# Patient Record
Sex: Female | Born: 1955
Health system: Southern US, Community
[De-identification: ages and names within clinical notes are randomized; demographics above are authoritative.]

## PROBLEM LIST (undated history)

## (undated) DIAGNOSIS — I214 Non-ST elevation (NSTEMI) myocardial infarction: Secondary | ICD-10-CM

## (undated) DIAGNOSIS — K5900 Constipation, unspecified: Secondary | ICD-10-CM

## (undated) DIAGNOSIS — E876 Hypokalemia: Secondary | ICD-10-CM

## (undated) DIAGNOSIS — I358 Other nonrheumatic aortic valve disorders: Secondary | ICD-10-CM

## (undated) DIAGNOSIS — I2510 Atherosclerotic heart disease of native coronary artery without angina pectoris: Secondary | ICD-10-CM

## (undated) DIAGNOSIS — I5032 Chronic diastolic (congestive) heart failure: Secondary | ICD-10-CM

## (undated) DIAGNOSIS — E785 Hyperlipidemia, unspecified: Secondary | ICD-10-CM

## (undated) DIAGNOSIS — I509 Heart failure, unspecified: Secondary | ICD-10-CM

## (undated) DIAGNOSIS — I519 Heart disease, unspecified: Secondary | ICD-10-CM

## (undated) DIAGNOSIS — K274 Chronic or unspecified peptic ulcer, site unspecified, with hemorrhage: Secondary | ICD-10-CM

## (undated) DIAGNOSIS — D62 Acute posthemorrhagic anemia: Secondary | ICD-10-CM

## (undated) DIAGNOSIS — C539 Malignant neoplasm of cervix uteri, unspecified: Secondary | ICD-10-CM

## (undated) DIAGNOSIS — J449 Chronic obstructive pulmonary disease, unspecified: Secondary | ICD-10-CM

## (undated) DIAGNOSIS — I10 Essential (primary) hypertension: Secondary | ICD-10-CM

## (undated) DIAGNOSIS — K219 Gastro-esophageal reflux disease without esophagitis: Secondary | ICD-10-CM

## (undated) DIAGNOSIS — F17200 Nicotine dependence, unspecified, uncomplicated: Secondary | ICD-10-CM

## (undated) DIAGNOSIS — I129 Hypertensive chronic kidney disease with stage 1 through stage 4 chronic kidney disease, or unspecified chronic kidney disease: Secondary | ICD-10-CM

## (undated) DIAGNOSIS — F32A Depression, unspecified: Secondary | ICD-10-CM

## (undated) DIAGNOSIS — F329 Major depressive disorder, single episode, unspecified: Secondary | ICD-10-CM

## (undated) DIAGNOSIS — E669 Obesity, unspecified: Secondary | ICD-10-CM

## (undated) DIAGNOSIS — R011 Cardiac murmur, unspecified: Secondary | ICD-10-CM

## (undated) DIAGNOSIS — K922 Gastrointestinal hemorrhage, unspecified: Secondary | ICD-10-CM

## (undated) HISTORY — DX: Nicotine dependence, unspecified, uncomplicated: F17.200

## (undated) HISTORY — DX: Hypomagnesemia: E83.42

## (undated) HISTORY — DX: Malignant neoplasm of cervix uteri, unspecified: C53.9

## (undated) HISTORY — DX: Hyperlipidemia, unspecified: E78.5

## (undated) HISTORY — DX: Atherosclerotic heart disease of native coronary artery without angina pectoris: I25.10

## (undated) HISTORY — DX: Hypertensive chronic kidney disease with stage 1 through stage 4 chronic kidney disease, or unspecified chronic kidney disease: I12.9

## (undated) HISTORY — DX: Acute posthemorrhagic anemia: D62

## (undated) HISTORY — DX: Constipation, unspecified: K59.00

## (undated) HISTORY — PX: BREAST BIOPSY: SHX20

## (undated) HISTORY — DX: Gastrointestinal hemorrhage, unspecified: K92.2

## (undated) HISTORY — PX: CHOLECYSTECTOMY: SHX55

## (undated) HISTORY — DX: Chronic diastolic (congestive) heart failure: I50.32

## (undated) HISTORY — DX: Heart disease, unspecified: I51.9

## (undated) HISTORY — DX: Essential (primary) hypertension: I10

## (undated) HISTORY — DX: Heart failure, unspecified: I50.9

## (undated) HISTORY — DX: Chronic obstructive pulmonary disease, unspecified: J44.9

## (undated) HISTORY — DX: Other nonrheumatic aortic valve disorders: I35.8

## (undated) HISTORY — PX: CERVICAL CONE BIOPSY: SUR198

---

## 2000-12-15 ENCOUNTER — Emergency Department (HOSPITAL_COMMUNITY): Admission: EM | Admit: 2000-12-15 | Discharge: 2000-12-15 | Payer: Self-pay | Admitting: Emergency Medicine

## 2001-01-16 ENCOUNTER — Encounter: Payer: Self-pay | Admitting: Internal Medicine

## 2001-01-16 ENCOUNTER — Ambulatory Visit (HOSPITAL_COMMUNITY): Admission: RE | Admit: 2001-01-16 | Discharge: 2001-01-16 | Payer: Self-pay | Admitting: Internal Medicine

## 2001-01-16 ENCOUNTER — Other Ambulatory Visit: Admission: RE | Admit: 2001-01-16 | Discharge: 2001-01-16 | Payer: Self-pay | Admitting: Internal Medicine

## 2002-12-30 ENCOUNTER — Emergency Department (HOSPITAL_COMMUNITY): Admission: EM | Admit: 2002-12-30 | Discharge: 2002-12-30 | Payer: Self-pay | Admitting: Emergency Medicine

## 2002-12-30 ENCOUNTER — Encounter: Payer: Self-pay | Admitting: Emergency Medicine

## 2006-02-08 HISTORY — PX: CORONARY ARTERY BYPASS GRAFT: SHX141

## 2006-03-02 ENCOUNTER — Encounter: Payer: Self-pay | Admitting: Internal Medicine

## 2006-03-02 ENCOUNTER — Ambulatory Visit: Payer: Self-pay | Admitting: Cardiology

## 2006-03-02 ENCOUNTER — Inpatient Hospital Stay (HOSPITAL_COMMUNITY): Admission: EM | Admit: 2006-03-02 | Discharge: 2006-03-11 | Payer: Self-pay | Admitting: Cardiology

## 2006-03-03 ENCOUNTER — Encounter: Payer: Self-pay | Admitting: Vascular Surgery

## 2006-03-24 ENCOUNTER — Ambulatory Visit (HOSPITAL_COMMUNITY): Admission: RE | Admit: 2006-03-24 | Discharge: 2006-03-24 | Payer: Self-pay | Admitting: Cardiology

## 2006-03-24 ENCOUNTER — Ambulatory Visit: Payer: Self-pay | Admitting: Cardiology

## 2006-05-26 ENCOUNTER — Ambulatory Visit: Payer: Self-pay | Admitting: Cardiology

## 2006-06-06 ENCOUNTER — Ambulatory Visit: Payer: Self-pay | Admitting: Thoracic Surgery (Cardiothoracic Vascular Surgery)

## 2006-11-25 ENCOUNTER — Ambulatory Visit: Payer: Self-pay | Admitting: Cardiology

## 2007-06-26 ENCOUNTER — Emergency Department (HOSPITAL_COMMUNITY): Admission: EM | Admit: 2007-06-26 | Discharge: 2007-06-26 | Payer: Self-pay | Admitting: Emergency Medicine

## 2007-06-30 ENCOUNTER — Ambulatory Visit: Payer: Self-pay | Admitting: Cardiology

## 2007-10-27 ENCOUNTER — Encounter (INDEPENDENT_AMBULATORY_CARE_PROVIDER_SITE_OTHER): Payer: Self-pay | Admitting: *Deleted

## 2007-10-27 LAB — CONVERTED CEMR LAB
Albumin: 4.6 g/dL
Cholesterol: 224 mg/dL
Triglycerides: 192 mg/dL

## 2008-05-02 ENCOUNTER — Emergency Department (HOSPITAL_COMMUNITY): Admission: EM | Admit: 2008-05-02 | Discharge: 2008-05-02 | Payer: Self-pay | Admitting: Emergency Medicine

## 2009-04-03 ENCOUNTER — Telehealth (INDEPENDENT_AMBULATORY_CARE_PROVIDER_SITE_OTHER): Payer: Self-pay | Admitting: *Deleted

## 2009-04-04 ENCOUNTER — Encounter (INDEPENDENT_AMBULATORY_CARE_PROVIDER_SITE_OTHER): Payer: Self-pay | Admitting: *Deleted

## 2009-04-04 ENCOUNTER — Ambulatory Visit: Payer: Self-pay | Admitting: Cardiology

## 2009-04-04 DIAGNOSIS — Q251 Coarctation of aorta: Secondary | ICD-10-CM | POA: Insufficient documentation

## 2009-04-04 DIAGNOSIS — R5381 Other malaise: Secondary | ICD-10-CM

## 2009-04-04 DIAGNOSIS — R5383 Other fatigue: Secondary | ICD-10-CM

## 2009-04-04 DIAGNOSIS — F17200 Nicotine dependence, unspecified, uncomplicated: Secondary | ICD-10-CM | POA: Insufficient documentation

## 2009-04-04 DIAGNOSIS — E785 Hyperlipidemia, unspecified: Secondary | ICD-10-CM | POA: Insufficient documentation

## 2009-04-04 DIAGNOSIS — C539 Malignant neoplasm of cervix uteri, unspecified: Secondary | ICD-10-CM | POA: Insufficient documentation

## 2009-04-04 DIAGNOSIS — I2510 Atherosclerotic heart disease of native coronary artery without angina pectoris: Secondary | ICD-10-CM | POA: Insufficient documentation

## 2009-04-21 ENCOUNTER — Encounter: Payer: Self-pay | Admitting: Cardiology

## 2009-04-24 LAB — CONVERTED CEMR LAB
Alkaline Phosphatase: 80 units/L (ref 39–117)
BUN: 11 mg/dL (ref 6–23)
Basophils Absolute: 0 10*3/uL (ref 0.0–0.1)
Chloride: 101 meq/L (ref 96–112)
Cholesterol: 312 mg/dL — ABNORMAL HIGH (ref 0–200)
Eosinophils Absolute: 0.3 10*3/uL (ref 0.0–0.7)
Eosinophils Relative: 3 % (ref 0–5)
HCT: 48.5 % — ABNORMAL HIGH (ref 36.0–46.0)
LDL Cholesterol: 238 mg/dL — ABNORMAL HIGH (ref 0–99)
Lymphocytes Relative: 31 % (ref 12–46)
Lymphs Abs: 2.6 10*3/uL (ref 0.7–4.0)
MCV: 93.6 fL (ref 78.0–100.0)
Neutro Abs: 4.9 10*3/uL (ref 1.7–7.7)
Neutrophils Relative %: 57 % (ref 43–77)
Potassium: 3.8 meq/L (ref 3.5–5.3)
RBC: 5.18 M/uL — ABNORMAL HIGH (ref 3.87–5.11)
TSH: 3.042 microintl units/mL (ref 0.350–4.500)
Total Bilirubin: 0.4 mg/dL (ref 0.3–1.2)
Total Protein: 7.3 g/dL (ref 6.0–8.3)

## 2009-04-26 ENCOUNTER — Telehealth: Payer: Self-pay | Admitting: Cardiology

## 2009-04-27 ENCOUNTER — Encounter (INDEPENDENT_AMBULATORY_CARE_PROVIDER_SITE_OTHER): Payer: Self-pay | Admitting: *Deleted

## 2009-05-08 ENCOUNTER — Ambulatory Visit: Payer: Self-pay | Admitting: Cardiology

## 2009-05-17 ENCOUNTER — Encounter (INDEPENDENT_AMBULATORY_CARE_PROVIDER_SITE_OTHER): Payer: Self-pay | Admitting: *Deleted

## 2009-05-24 ENCOUNTER — Telehealth (INDEPENDENT_AMBULATORY_CARE_PROVIDER_SITE_OTHER): Payer: Self-pay | Admitting: *Deleted

## 2009-05-26 ENCOUNTER — Ambulatory Visit: Payer: Self-pay | Admitting: Cardiology

## 2009-06-15 ENCOUNTER — Ambulatory Visit: Payer: Self-pay | Admitting: Cardiology

## 2009-11-28 ENCOUNTER — Telehealth (INDEPENDENT_AMBULATORY_CARE_PROVIDER_SITE_OTHER): Payer: Self-pay | Admitting: *Deleted

## 2010-01-04 ENCOUNTER — Telehealth (INDEPENDENT_AMBULATORY_CARE_PROVIDER_SITE_OTHER): Payer: Self-pay | Admitting: *Deleted

## 2010-04-12 NOTE — Assessment & Plan Note (Signed)
Summary: 1 MTH NURSE VISIT PER CHECKOUT ON 04/04/09/TG  Nurse Visit   Vital Signs:  Patient profile:   55 year old female Height:      62 inches Weight:      203 pounds O2 Sat:      95 % on Room air Pulse rate:   80 / minute BP sitting:   165 / 81  (left arm)  Vitals Entered By: Teressa Lower RN (May 09, 2009 10:46 AM)  O2 Flow:  Room air  Impression & Recommendations:  Problem # 1:  HYPERTENSION (ICD-401.1)  Increase chlorthalidone to 25 mg q.d. Add lisinopril 20 mg q.d. Blood pressure check in one month; home blood pressure checks Basic metabolic profile in 3 weeks.  Renfrow Bing, M.D.  Future Orders: T-Basic Metabolic Panel [id number]) ... 06/07/2009   Visit Type:  nurse visit Primary Provider:  None   History of Present Illness: Dyspnea; patient failed to bring the list of home blood pressures with her.      Current Medications (verified): 1)  Ecotrin 325 Mg Tbec (Aspirin) .... Take 1 Tab Daily 2)  Metoprolol Tartrate 100 Mg Tabs (Metoprolol Tartrate) .... Take 1/2 Tab Two Times A Day 3)  Simvastatin 80 Mg Tabs (Simvastatin) .... Take One Tablet By Mouth Daily At Bedtime 4)  Chlorthalidone 25 Mg Tabs (Chlorthalidone) .... Take One Half  Tablet By Mouth Daily 5)  Niacin 100 Mg Tabs (Niacin) .... Take 1 Tablet By Mouth Three Times A Day Titrating To 500mg  Three Times A Day  Allergies (verified): No Known Drug Allergies  Orders Added: 1)  T-Basic Metabolic Panel [id number] Prescriptions: LISINOPRIL 20 MG TABS (LISINOPRIL) Take one tablet by mouth daily  #30 x 6   Entered by:   Teressa Lower RN   Authorized by:   Kathlen Brunswick, MD, [facility]   Signed by:   Teressa Lower RN on 05/17/2009   Method used:   Electronically to        [facility]  [address] (retail)       [address]       [place], [place]  [address]       Ph: [phone]       Fax: [phone]   RxID:   [id number] CHLORTHALIDONE 25 MG TABS (CHLORTHALIDONE)  Take one  tablet by mouth daily  #30 x 6   Entered by:   Teressa Lower RN   Authorized by:   Kathlen Brunswick, MD, [facility]   Signed by:   Teressa Lower RN on 05/17/2009   Method used:   Electronically to        [facility]  [address] (retail)       [address]       [place], [place]  [address]       Ph: [phone]       Fax: [phone]   RxID:   [id number]   pt notified of changes  by telephone and by mail, rx, labs ordered and nurse visit scheduled for 06/15/2009 4pm

## 2010-04-12 NOTE — Progress Notes (Signed)
Summary: Refill   Phone Note Call from Patient   Caller: Patient Reason for Call: Refill Medication Summary of Call: patient needs refill on Simvastatin 80mg  sent to [facility] in [place] / would like 90 day supply / tg Initial call taken by: Raechel Ache [facility],  January 04, 2010 1:50 PM    Prescriptions: SIMVASTATIN 80 MG TABS (SIMVASTATIN) Take one tablet by mouth daily at bedtime  #90 x 3   Entered by:   Teressa Lower RN   Authorized by:   Kathlen Brunswick, MD, [facility]   Signed by:   Teressa Lower RN on 01/04/2010   Method used:   Electronically to        [facility]  [address] (retail)       [address]       [place], [place]  [address]       Ph: [phone]       Fax: [phone]   RxID:   [id number]

## 2010-04-12 NOTE — Progress Notes (Signed)
Summary: REfill   Phone Note Call from Patient   Caller: Patient Reason for Call: Refill Medication Summary of Call: patient needs refill for Metoprolol TART 100mg  1/2 tab two times a day called into [facility] in [place]/tg Initial call taken by: Raechel Ache [facility],  November 28, 2009 1:38 PM    Prescriptions: METOPROLOL TARTRATE 100 MG TABS (METOPROLOL TARTRATE) take 1/2 tab two times a day  #30 x 6   Entered by:   Teressa Lower RN   Authorized by:   Kathlen Brunswick, MD, [facility]   Signed by:   Teressa Lower RN on 11/28/2009   Method used:   Electronically to        [facility]  [address] (retail)       [address]       [place], [place]  [address]       Ph: [phone]       Fax: [phone]   RxID:   [id number]

## 2010-04-12 NOTE — Assessment & Plan Note (Signed)
Summary: nurse visit  Nurse Visit   Vital Signs:  Patient profile:   55 year old female Height:      62 inches Weight:      198 pounds O2 Sat:      98 % on Room air Pulse rate:   74 / minute BP sitting:   107 / 59  (left arm)  Vitals Entered By: Teressa Lower RN (June 15, 2009 3:52 PM)  O2 Flow:  Room air   Visit Type:  3 week Nurse visit Primary Provider:  None   History of Present Illness: dizziness when standing, sob normal for pt obtained orthostatic bp's   Current Medications (verified): 1)  Ecotrin 325 Mg Tbec (Aspirin) .... Take 1 Tab Daily 2)  Metoprolol Tartrate 100 Mg Tabs (Metoprolol Tartrate) .... Take 1/2 Tab Two Times A Day 3)  Simvastatin 80 Mg Tabs (Simvastatin) .... Take One Tablet By Mouth Daily At Bedtime 4)  Chlorthalidone 25 Mg Tabs (Chlorthalidone) .... Take One  Tablet By Mouth Daily 5)  Niacin 500 Mg Tabs (Niacin) .... Take 1 Tablet By Mouth Three Times A Day 6)  Lisinopril 20 Mg Tabs (Lisinopril) .... Take One Tablet By Mouth Daily  Allergies (verified): No Known Drug Allergies

## 2010-04-12 NOTE — Progress Notes (Signed)
Summary: reaction to meds   Phone Note Call from Patient Call back at Home Phone [phone]   Caller: pt Reason for Call: Talk to Nurse Summary of Call: Pt c/o back ache and nausea-wonders if this is an adverse effect of medication.  Her symptoms are unlikely to have been caused by the modifications in her medical regime made 2 weeks ago.  Have her come in for a nursing visit, obtain complete metabolic profile and measure orthostatic vital signs.  Warrens Bing, M.D.   Initial call taken by: Faythe Ghee,  May 24, 2009 3:17 PM  Follow-up for Phone Call        pt started taking medication 2 days later stomach and back began to hurt and nausea developed.  Pt feels drained and listless.  Appetite is the same.  Last bp  05/14/2009   151/71   86 Follow-up by: Teressa Lower RN,  May 24, 2009 4:48 PM  Additional Follow-up for Phone Call Additional follow up Details #1::        pt's phone not accepting calls at this time will call back at another time Additional Follow-up by: Teressa Lower RN,  May 25, 2009 2:32 PM    Additional Follow-up for Phone Call Additional follow up Details #2::    nurse visit 05/26/2009 1600 Follow-up by: Teressa Lower RN,  May 25, 2009 5:03 PM

## 2010-04-12 NOTE — Miscellaneous (Signed)
Summary: LABS LIPID,LIVER,10/27/2007  Clinical Lists Changes  Observations: Added new observation of ALBUMIN: 4.6 g/dL (16/12/9602 5:40) Added new observation of PROTEIN, TOT: 7.3 g/dL (98/01/9146 8:29) Added new observation of SGPT (ALT): 33 units/L (10/27/2007 8:31) Added new observation of SGOT (AST): 24 units/L (10/27/2007 8:31) Added new observation of ALK PHOS: 75 units/L (10/27/2007 8:31) Added new observation of BILI DIRECT: 0.1 mg/dL (56/21/3086 5:78) Added new observation of LDL: 138 mg/dL (46/96/2952 8:41) Added new observation of HDL: 48 mg/dL (32/44/0102 7:25) Added new observation of TRIGLYC TOT: 192 mg/dL (36/64/4034 7:42) Added new observation of CHOLESTEROL: 224 mg/dL (59/56/3875 6:43)

## 2010-04-12 NOTE — Letter (Signed)
Summary: [place] Future Lab Work Letter  [facility]     [place], [place]    Phone:   Fax:      April 04, 2009 MRN: [id number]   [facility] [address] [place], [place]  [address]      YOUR LAB WORK IS DUE   _____________MONDAY____________________________  Please go to [facility], located across the street from [facility] on the second floor.  Hours are Monday - Friday 7am until 7:30pm         Saturday 8am until 12noon    _X_  DO NOT EAT OR DRINK AFTER MIDNIGHT EVENING PRIOR TO LABWORK  __ YOUR LABWORK IS NOT FASTING --YOU MAY EAT PRIOR TO LABWORK

## 2010-04-12 NOTE — Letter (Signed)
Summary: [place] Future Lab Work Engineer, agricultural at [employer]  [address] [place] [address]   Phone: [phone]  Fax: [phone]     May 17, 2009 MRN: [id number]   [facility] [address] [place], [place]  [address]      YOUR LAB WORK IS DUE  June 07, 2009 _________________________________________  Please go to [facility], located across the street from [facility] on the second floor.  Hours are Monday - Friday 7am until 7:30pm         Saturday 8am until 12noon    __  DO NOT EAT OR DRINK AFTER MIDNIGHT EVENING PRIOR TO LABWORK  _X_ YOUR LABWORK IS NOT FASTING --YOU MAY EAT PRIOR TO LABWORK

## 2010-04-12 NOTE — Letter (Signed)
Summary: [place] Results Engineer, agricultural at [facility]  [address], [place] [address]   Phone: [phone]  Fax: [phone]      May 17, 2009 MRN: [id number]   [facility] [address] [place], [place]  [address]   Dear Ms. Brodman,  Your test ordered by Selena Batten has been reviewed by your physician (or physician assistant) and was found to be normal or stable. Your physician (or physician assistant) felt no changes were needed at this time.  ____ Echocardiogram  ____ Cardiac Stress Test  ____ Lab Work  ____ Peripheral vascular study of arms, legs or neck  ____ CT scan or X-ray  ____ Lung or Breathing test  __x__ Other: Nurse Visit  Per Dr. Dietrich Pates: Increase chlorthalidone to 25mg  daily (1 tablet) start lisinopril 20mg  daily Nurse Visit 06/15/2009 at 4:00 pm Continue checking blood pressure daily and bring these reading to your nurse visit Labwork in 3 week, requistion enclosed   Thank you, Kalief Kattner RN     Bing, MD, [facility].C.Gaylord Shih, MD, F.A.C.C Lewayne Bunting, MD, F.A.C.C Nona Dell, MD, F.A.C.C Charlton Haws, MD, [facility].C.C

## 2010-04-12 NOTE — Letter (Signed)
Summary: [place] Future Lab Work Engineer, agricultural at [employer]  [address] [place] [address]   Phone: [phone]  Fax: [phone]     April 27, 2009 MRN: [id number]   [facility] [address] [place], [place]  [address]      YOUR LAB WORK IS DUE   ____________APRIL 18, 2011_____________________________  Please go to [facility], located across the street from [facility] on the second floor.  Hours are Monday - Friday 7am until 7:30pm         Saturday 8am until 12noon    _X_  DO NOT EAT OR DRINK AFTER MIDNIGHT EVENING PRIOR TO LABWORK  __ YOUR LABWORK IS NOT FASTING --YOU MAY EAT PRIOR TO LABWORK

## 2010-04-12 NOTE — Progress Notes (Signed)
Summary: rx refills made appt already   Phone Note Call from Patient Call back at [phone]   Caller: pt walk in Reason for Call: Refill Medication Summary of Call: pt needs metoprolol 100 mg 1/2 tab by mouth twice day, pravastatin 40 mg [facility] Initial call taken by: Faythe Ghee,  April 03, 2009 4:39 PM  Follow-up for Phone Call        pt to see md tomorrow, has not been seen since 09 will wait to see what md prescribes prior to refills Follow-up by: Teressa Lower RN,  April 03, 2009 4:56 PM     Appended Document: rx refills made appt already per pt call has not been called in to [facility] yet

## 2010-04-12 NOTE — Assessment & Plan Note (Signed)
Summary: nurse visit  Nurse Visit   Vital Signs:  Patient profile:   55 year old female Height:      62 inches Weight:      199 pounds O2 Sat:      97 % on Room air Pulse rate:   73 / minute Pulse (ortho):   75 / minute BP sitting:   102 / 54  (right arm) BP standing:   94 / 55  Vitals Entered By: Dreama Saa, CNA (May 26, 2009 3:48 PM)  O2 Flow:  Room air  Impression & Recommendations:  Problem # 1:  HYPERTENSION (ICD-401.1) Continue to collect home blood pressures Return RN visit in one month  [place]on Bing, M.D. She   Visit Type:  nurse visit Primary Provider:  None   History of Present Illness: c/o shortness of breath,brought bp diary only 4 recordings as listed 04-21-09     184/74   90. 04-26-09     172/81  82 05-04-09     146/74  86 3-6/11     151/71   86   Serial Vital Signs/Assessments:  Time      Position  BP       Pulse  Resp  Temp     By 4:15 PM   Lying LA  96/59    71                    Tammy Sanders RN 4:15 PM   Sitting   101/61   70                    Tammy Sanders RN 4:15 PM   Standing  94/55    75                    Tammy Sanders RN  Comments: 4:15 PM lying symptoms:   none sitting symptoms:  none standing symptoms:  gait unsteady By: Teressa Lower RN    Current Medications (verified): 1)  Ecotrin 325 Mg Tbec (Aspirin) .... Take 1 Tab Daily 2)  Metoprolol Tartrate 100 Mg Tabs (Metoprolol Tartrate) .... Take 1/2 Tab Two Times A Day 3)  Simvastatin 80 Mg Tabs (Simvastatin) .... Take One Tablet By Mouth Daily At Bedtime 4)  Chlorthalidone 25 Mg Tabs (Chlorthalidone) .... Take One  Tablet By Mouth Daily 5)  Niacin 100 Mg Tabs (Niacin) .... Take 1 Tablet By Mouth Three Times A Day Titrating To 500mg  Three Times A Day 6)  Lisinopril 20 Mg Tabs (Lisinopril) .... Take One Tablet By Mouth Daily  Allergies (verified): No Known Drug Allergies

## 2010-04-12 NOTE — Assessment & Plan Note (Signed)
Summary: f1y  Medications Added PRAVASTATIN SODIUM 40 MG TABS (PRAVASTATIN SODIUM) take 1 tab daily CHLORTHALIDONE 25 MG TABS (CHLORTHALIDONE) Take one half  tablet by mouth daily      Allergies Added: NKDA  Visit Type:  Follow-up Primary Provider:  None   History of Present Illness: Return visit for this pleasant 55 year old woman with coronary artery disease and multiple cardiovascular risk factors.  Since her last visit, she has done fairly well.  She does not have a primary care physician and has not seen any other healthcare providers.  She has not required hospitalization or emergency department evaluation.  She has no chest discomfort, but  chronic class 2-3 dyspnea on exertion is present.  Current Medications (verified): 1)  Ecotrin 325 Mg Tbec (Aspirin) .... Take 1 Tab Daily 2)  Metoprolol Tartrate 100 Mg Tabs (Metoprolol Tartrate) .... Take 1/2 Tab Two Times A Day 3)  Pravastatin Sodium 40 Mg Tabs (Pravastatin Sodium) .... Take 1 Tab Daily 4)  Chlorthalidone 25 Mg Tabs (Chlorthalidone) .... Take One Half  Tablet By Mouth Daily  Allergies (verified): No Known Drug Allergies  Past History:  PMH, FH, and Social History reviewed and updated.  Review of Systems       The patient complains of dyspnea on exertion.  The patient denies weight loss, weight gain, vision loss, decreased hearing, hoarseness, chest pain, syncope, peripheral edema, prolonged cough, headaches, and abdominal pain.    Vital Signs:  Patient profile:   55 year old female Height:      62 inches Weight:      206 pounds BMI:     37.81 Pulse rate:   75 / minute BP sitting:   162 / 74  (right arm)  Vitals Entered By: Dreama Saa, CNA (April 04, 2009 2:39 PM)  Physical Exam  General:   General-Obese but well developed; no acute distress:   Neck-No JVD; bilateral carotid bruits vs. transmitted murmur Lungs-No tachypnea, no rales; minor inspiratory and expiratory wheezes and  rhonchi Cardiovascular-normal PMI; normal S1 and S2; grade 2-3/6 systolic ejection murmur at the cardiac base Abdomen-BS normal; soft and non-tender without masses or organomegaly:  Musculoskeletal-No deformities, no cyanosis or clubbing: Neurologic-Normal cranial nerves; symmetric strength and tone:  Skin-Warm, no significant lesions: Extremities-Nl distal pulses; no edema:     Impression & Recommendations:  Problem # 1:  ATHEROSCLEROTIC CARDIOVASCULAR DISEASE (ICD-429.2) She remains stable, now 3 years following coronary artery bypass graft surgery.  I explained her that our principle goal is to optimize cardiovascular risk factors as detailed below.  Problem # 2:  TOBACCO ABUSE (ICD-305.1) After a brief period of abstinence, she has resumed cigarette smoking.  She consumes up to one pack per day.  I emphasized how deleterious this is for her, but she is not inclined to quit at the present time.  Problem # 3:  HYPERLIPIDEMIA (ICD-272.4) She has very high total cholesterol and LDL at baseline, and is unlikely to achieve controlled with pravastatin.  She could not afford other agents.  A fasting lipid profile will be reassessed.  We can consider the addition of regular niacin.  Problem # 4:  COARCTATION OF AORTA (ICD-747.10) This lesion was discovered when she underwent coronary artery bypass graft surgery.  I will obtain prior records and review them, but I believe that her coarctation was thought mild enough that she would never need intervention.  Problem # 5:  HYPERTENSION (ICD-401.1) Blood pressure control is suboptimal.  We will ask her to monitor  blood pressures at a local pharmacy.  Chlorthalidone 12.5 mg q.d. will be added to her medical regime.  She will return in one month for a blood pressure check with cardiology nurses.  I will see her again in 4 months.  Other Orders: Future Orders: T-Comprehensive Metabolic Panel ([id number]) ... 04/10/2009 T-Lipid Profile  [id number]) ... 04/10/2009 T-CBC w/Diff ([id number]) ... 04/10/2009 T-TSH [phone] [phone]) ... 04/10/2009  Patient Instructions: 1)  Your physician recommends that you schedule a follow-up appointment in: 1 year 2)  Your physician recommends that you return for lab work in: next week 3)  Your physician has recommended you make the following change in your medication: chlorthalidone 1/2 tablet by mouth daily 4)  You have been referred to nurse visit in 1 month 5)  Your physician has requested that you regularly monitor and record your blood pressure readings at home.  Please use the same machine at the same time of day to check your readings and record them to bring to your follow-up visit. [facility] or other [facility] stores Prescriptions: CHLORTHALIDONE 25 MG TABS (CHLORTHALIDONE) Take one half  tablet by mouth daily  #15 x 6   Entered by:   Teressa Lower RN   Authorized by:   Kathlen Brunswick, MD, [facility]   Signed by:   Teressa Lower RN on 04/04/2009   Method used:   Electronically to        [facility]  [address] (retail)       [address]       [place], [place]  [address]       Ph: [phone]       Fax: [phone]   RxID:   [id number]

## 2010-04-12 NOTE — Progress Notes (Signed)
Summary: Lab work results/change in meds  Medications Added SIMVASTATIN 80 MG TABS (SIMVASTATIN) Take one tablet by mouth daily at bedtime NIACIN 100 MG TABS (NIACIN) Take 1 tablet by mouth three times a day titrating to 500mg  three times a day       Phone Note Outgoing Call   Call placed by: Larita Fife Via LPN,  April 26, 2009 10:28 AM Reason for Call: Discuss lab or test results Summary of Call: No answer, left message. Due to recent lab work patient needs to be advised to stop taking Pravastatin and start taking Simvastatin 80mg  by mouth once daily and Niacin starting at a dose of 100mg  by mouth three times a day titrateing up to 500mg  by mouth three times a day. Mrs. Mitchell also needs to repeat lab work in 2 months.    New/Updated Medications: SIMVASTATIN 80 MG TABS (SIMVASTATIN) Take one tablet by mouth daily at bedtime NIACIN 100 MG TABS (NIACIN) Take 1 tablet by mouth three times a day titrating to 500mg  three times a day Prescriptions: SIMVASTATIN 80 MG TABS (SIMVASTATIN) Take one tablet by mouth daily at bedtime  #30 x 6   Entered by:   Teressa Lower RN   Authorized by:   Gaylord Shih, MD, [facility]   Signed by:   Teressa Lower RN on 04/27/2009   Method used:   Electronically to        [facility]  [address] (retail)       [address]       [place], [place]  [address]       Ph: [phone]       Fax: [phone]   RxID:   [id number]   Appended Document: Lab work results/change in meds    Clinical Lists Changes  Orders: Added new Test order of T-Lipid Profile [phone]) - Signed Added new Test order of T-Comprehensive Metabolic Panel [phone]) - Signed

## 2010-06-02 ENCOUNTER — Other Ambulatory Visit: Payer: Self-pay | Admitting: Cardiology

## 2010-06-26 LAB — STREP A DNA PROBE: Group A Strep Probe: NEGATIVE

## 2010-07-11 ENCOUNTER — Telehealth: Payer: Self-pay | Admitting: Cardiology

## 2010-07-11 MED ORDER — METOPROLOL TARTRATE 100 MG PO TABS: 50.0000 mg | ORAL_TABLET | Freq: Two times a day (BID) | ORAL | Status: DC

## 2010-07-11 NOTE — Telephone Encounter (Signed)
METOPROLOL 100MG  NEEDS CALLED IN TO [facility]. PT WAS DUE TO SEE [facility] 03/2010, SHE DOESN'T WANT TO MAKE APPT. DUE TO MONEY.

## 2010-07-11 NOTE — Telephone Encounter (Signed)
Pt has money issues, needs an appt , requests to be billed because of no income, will send [facility] discount papers to pt

## 2010-07-12 ENCOUNTER — Encounter: Payer: Self-pay | Admitting: *Deleted

## 2010-07-12 NOTE — Telephone Encounter (Signed)
I sent pt an application for the [facility] discount and offered to billed her for her visit/

## 2010-07-24 NOTE — Assessment & Plan Note (Signed)
[facility]                       [place] CARDIOLOGY OFFICE NOTE   Christina Boyle, Christina Boyle                    MRN:          [id number]  DATE:06/30/2007                            DOB:          11/15/1955    Christina Boyle returns to the office as scheduled for continued assessment  and treatment of coronary disease and cardiovascular risk factors.  Unfortunately, she is not eligible for care at the free clinic.  She has  not been able to enroll in cardiac rehabilitation due to financial  considerations.  She was apparently given forms to fill out for a  scholarship, but failed to do so.  She continues to smoke cigarettes.   CURRENT MEDICATIONS:  Include aspirin 325 mg daily, metoprolol 50 mg  b.i.d., simvastatin 80 mg daily.   EXAM:  Overweight woman with flat affect in no acute distress.  The weight is 198, 4 pounds increase since September.  Blood pressure  130/80, heart rate 75 and regular, respirations 16.  NECK:  No jugular venous distention; bilateral bruits versus transmitted  murmur.  LUNGS:  Inspiratory and expiratory rhonchi.  CARDIAC:  Normal first and second heart sounds; grade 2 - 3/6 systolic  ejection murmur at the cardiac base.  ABDOMEN:  Soft and nontender; no organomegaly.  EXTREMITIES:  Normal distal pulses; no edema.   Last lipid profile was in September, at which time total cholesterol was  323, triglycerides 194, HDL 44 and LDL 240.  Chemistry profile was  normal at that time.  She was switched to simvastatin 80 mg daily after  multiple attempts to contact her by phone.  She did not return for a  followup lipid profile.   IMPRESSION:  Christina Boyle continues to have suboptimal control of risk  factors due to financial issues and indifference to her medical care.  We will check a lipid profile and chemistry profile today and adjuster  lipid lowering medication, if this will be financially feasible.  Hypertension is controlled.   Her cerebrovascular disease was assessed in  late 2007 at which time there was no significant focal stenosis.  She  has a coarctation that does not require intervention.  We will try to  continue to pursue cardiac rehabilitation for her and plan a return  office visit in 9 months.     Gerrit Friends. Dietrich Pates, MD, [facility]  Electronically Signed    RMR/MedQ  DD: 06/30/2007  DT: 06/30/2007  Job #: [id number]

## 2010-07-24 NOTE — Assessment & Plan Note (Signed)
[facility]ALTHC[facility]                       [place] CARDIOLOGY OFFICE NOTE   Christina Boyle, Christina Boyle                    MRN:          [id number]  DATE:11/25/2006                            DOB:          26-Nov-1955    REFERRING PHYSICIAN:  None.   Christina Boyle returns to the office for continued assessment and treatment  of coronary disease, cerebrovascular disease and cardiovascular risk  factors.  She has completely recovered from CABG surgery.  She continues  to have a class 2 dyspnea on exertion, which has been chronic.  She was  told that the cardiac rehabilitation program would be in touch with her,  but they have not.  Unfortunately, she continues to do some cigarette  smoking although she maintains that consumption has been reduced.   CURRENT MEDICATIONS:  1. Aspirin 25 mg daily.  2. Metoprolol 50 mg b.i.d.  3. Pravastatin 40 mg daily.  She never obtained followup labs because      she was asked to pay for the studies in advance.   EXAMINATION:  GENERAL APPEARANCE:  Overweight woman with flat affect in  no acute distress. The weight is 193, 30 pounds increase since  postoperatively.  VITAL SIGNS:  Blood pressure 150/75, heart rate 75 and regular,  respirations 16.  NECK:  Left carotid bruit.  LUNGS:  Clear.  CARDIAC:  Normal first heart sounds; preserved aortic component on the  second heart sound; grade 2/6 basalar systolic ejection murmur.  ABDOMEN:  Soft and nontender; no organomegaly.  EXTREMITIES:  One plus distal pulses; no edema.   IMPRESSION:  Christina Boyle has challenges both behavioral and financial.  We will remind cardiac rehabilitation to enroll her. This should help  with weight and compliance.  We will try to arrange for care in the free  clinic or elsewhere in [place].  She could consider an application  for disability, but this would be a long shot.  We will obtain a lipid  profile and chemistry profile today.     Gerrit Friends.  Dietrich Pates, MD, [facility]  Electronically Signed    RMR/MedQ  DD: 11/25/2006  DT: 11/25/2006  Job #: [id number]

## 2010-07-25 ENCOUNTER — Ambulatory Visit (INDEPENDENT_AMBULATORY_CARE_PROVIDER_SITE_OTHER): Payer: Self-pay | Admitting: Adult Health

## 2010-07-25 ENCOUNTER — Encounter: Payer: Self-pay | Admitting: Adult Health

## 2010-07-25 ENCOUNTER — Encounter: Payer: Self-pay | Admitting: *Deleted

## 2010-07-25 DIAGNOSIS — I359 Nonrheumatic aortic valve disorder, unspecified: Secondary | ICD-10-CM

## 2010-07-25 DIAGNOSIS — E785 Hyperlipidemia, unspecified: Secondary | ICD-10-CM

## 2010-07-25 DIAGNOSIS — I358 Other nonrheumatic aortic valve disorders: Secondary | ICD-10-CM

## 2010-07-25 DIAGNOSIS — I2510 Atherosclerotic heart disease of native coronary artery without angina pectoris: Secondary | ICD-10-CM

## 2010-07-25 DIAGNOSIS — I10 Essential (primary) hypertension: Secondary | ICD-10-CM

## 2010-07-25 MED ORDER — METOPROLOL TARTRATE 100 MG PO TABS: 50.0000 mg | ORAL_TABLET | Freq: Two times a day (BID) | ORAL | Status: DC

## 2010-07-25 NOTE — Assessment & Plan Note (Addendum)
I hear an AoV murmur.  She will have echocardiogram for LV fx and evaluation of this. She is asymptomatic with chest pain or fatigue. Unfortunately she continues to smoke.  There are some wheezes noted, but cleared easily with coughing.  I have advised her on cessation and ongoing CVD.  Continue on BB and ASA. BP up a little today. She has not taken her medications yet today.  I have ordered a BMET for renal fx on chlorothalidone.

## 2010-07-25 NOTE — Progress Notes (Signed)
HPI:Mrs Vanamburg is a very pleasant CF we are seeing for known history of CAD with CABG in 2007.  We are seeing her on one year follow-up. Continues chronic SOB, with exertion.  Still smoking.  No chest pain or hospitalizations.  Complaint of swelling in her hands causing break open and bleed at the joints.  No rash is seen.  Is seeing a physician at the health dept for this.   Allergies not on file  Current Outpatient Prescriptions  Medication Sig Dispense Refill  . aspirin 325 MG tablet Take 325 mg by mouth daily.        . chlorthalidone (HYGROTON) 25 MG tablet Take 25 mg by mouth daily.        . metoprolol (LOPRESSOR) 100 MG tablet Take 0.5 tablets (50 mg total) by mouth 2 (two) times daily.  30 tablet  0  . simvastatin (ZOCOR) 80 MG tablet Take 80 mg by mouth at bedtime.        Marland Kitchen DISCONTD: lisinopril (PRINIVIL,ZESTRIL) 20 MG tablet Take 20 mg by mouth daily.        Marland Kitchen DISCONTD: niacin (NIASPAN) 500 MG CR tablet Take 500 mg by mouth at bedtime.          Past Medical History  Diagnosis Date  . ASCVD (arteriosclerotic cardiovascular disease) 02/2006    coronary artery bypass graft surgery  . Tobacco abuse     discontinued in 03/2006 ;50 pack year consumption resumed  . Chronic bronchitis   . Coarctation of aorta   . Cervical cancer   . Hyperlipidemia     Past Surgical History  Procedure Date  . Left breast biopsy     for benign disease  . Cholecystectomy   . Coronary artery bypass graft 02/2006  . Cervical carcinoma     WNU:UVOZDG of systems complete and found to be negative unless listed above PHYSICAL EXAM BP 189/78  Pulse 74  Ht 5\' 2"  (1.575 m)  Wt 197 lb (89.359 kg)  BMI 36.03 kg/m2  SpO2 95% General: Well developed, well nourished, in no acute distress Head: Eyes PERRLA, No xanthomas.   Normal cephalic and atramatic  Lungs: Clear bilaterally to auscultation and percussion. Heart: HRRR S1 S2. 2/6 Systolic murmur with preserved S2.  Pulses are 2+ & equal.  Bilateral radiation carotid bruit. No JVD.  No abdominal bruits. No femoral bruits. Abdomen: Bowel sounds are positive, abdomen soft and non-tender without masses or                  Hernia's noted. Msk:  Back normal, normal gait. Normal strength and tone for age. Extremities: No clubbing, cyanosis or edema.  DP +1 Right first finger with cracking in the second joint,       No oozing or bleeding. Neuro: Alert and oriented X 3. Psych:  Good affect, responds appropriately EKG: NSR, LAE, nonspecific T wave abnormalities.  ASSESSMENT AND PLAN:

## 2010-07-25 NOTE — Progress Notes (Signed)
Addended by: Teressa Lower on: 07/25/2010 12:01 PM   Modules accepted: Orders

## 2010-07-25 NOTE — Assessment & Plan Note (Signed)
Will check lipids and LFT's.  She will have refill on Pravastatin.  No changes in dosages.

## 2010-07-25 NOTE — Patient Instructions (Signed)
Your physician recommends that you schedule a follow-up appointment in: 1 year Your physician recommends that you return for lab work [id number] 07/30/10 Your physician has requested that you have an echocardiogram. Echocardiography is a painless test that uses sound waves to create images of your heart. It provides your doctor with information about the size and shape of your heart and how well your heart's chambers and valves are working. This procedure takes approximately one hour. There are no restrictions for this procedure.  Marland Kitchen

## 2010-07-27 ENCOUNTER — Encounter: Payer: Self-pay | Admitting: Adult Health

## 2010-07-27 NOTE — Assessment & Plan Note (Signed)
[facility]                       [place] CARDIOLOGY OFFICE NOTE   Christina, Boyle                    MRN:          [id number]  DATE:05/26/2006                            DOB:          [birth year]    [name] returns to the office, now 3 months following CABG surgery.  She has residual aortic stenosis and coarctation of the aorta, both  thought to be nonsurgical at the present time. She never filled her  prescription for simvastatin. She has been taking;  1. Metoprolol 25 mg b.i.d.  2. Aspirin.   She has some lancinating chest discomfort at night that awakens her.  These last a matter of seconds. She has no dyspnea. She was never called  by cardiac rehabilitation.   On exam, pleasant woman in no acute distress. The weight is 181,  eighteen pounds more than at her last visit. Blood pressure 125/70,  heart rate 75 and regular, respirations 16.  NECK: No jugular venous distension.  LUNGS: Clear.  THORAX: Well-healed incision; mild tenderness over the sternum, which is  stable.  CARDIAC: Normal first and second heart sounds; grade 1-2/6 basalar  systolic ejection murmur.  ABDOMEN: Soft, and nontender; no organomegaly.  EXTREMITIES: No edema; no 1 + distal pulses.   IMPRESSION:  [name] is doing well. Blood pressure is excellent, but  which a known coarctation, we would like her fully beta blocked. Her  dose of metoprolol will be increased to 50 mg b.i.d. We will change her  lipid lowering agent to pravastatin 40 mg q.d. so it can be obtained at  [facility] cost effectively. A lipid profile and chemistry profile will be  obtained in 1 month. She has refrained from cigarette smoking since her  surgery and was congratulated. I will see her again in 6 months.     Gerrit Friends. Dietrich Pates, MD, [facility]  Electronically Signed    RMR/MedQ  DD: 05/26/2006  DT: 05/26/2006  Job #: [id number]

## 2010-07-27 NOTE — Op Note (Signed)
NAMESHRESHTA, MEDLEY             ACCOUNT NO.:  [id number]   MEDICAL RECORD NO.:  [id number]          PATIENT TYPE:  INP   LOCATION:  2305                         FACILITY:  [facility]   PHYSICIAN:  Salvatore Decent. Cornelius Moras, M.D. DATE OF BIRTH:  June 07, 1955   DATE OF PROCEDURE:  03/05/2006  DATE OF DISCHARGE:                               OPERATIVE REPORT   PREOPERATIVE DIAGNOSIS:  Left main disease with three-vessel coronary  artery disease.   POSTOPERATIVE DIAGNOSIS:  Left main disease with three-vessel coronary  artery disease.   PROCEDURE:  Median sternotomy for coronary artery bypass grafting x3  (left internal mammary artery to distal left anterior descending  coronary artery, saphenous vein graft to circumflex marginal branch,  saphenous vein graft to posterior descending coronary artery, endoscopic  saphenous vein harvest from right thigh and right lower leg).   SURGEON:  Dr. Purcell Nails   ASSISTANT:  Coral Ceo, P.A.   ANESTHESIA:  General   CLINICAL NOTE:  The patient is a 55 year old mildly obese female with no  documented previous history of coronary artery disease but risk factors  notable for longstanding tobacco abuse, familial hyperlipidemia as well  as a family history of premature coronary artery disease.  The patient  presents with the 2-3 year history of symptoms consistent with worsening  exertional angina.  She subsequently presented with an allergic reaction  related to topical exposure to cedar trees in her yard.  She developed a  severe rash as well as periorbital swelling.  In the emergency room at  [facility] she was given intravenous epinephrine.  Following  this, she developed severe substernal chest pain consistent with  unstable angina.  She ruled in for an acute myocardial infarction.  She  was transferred to [facility] where she underwent  cardiac catheterization demonstrating left main disease with severe  three-vessel  coronary artery disease and normal left ventricular  function.  She was also found to have a mild coarctation of the aorta  just beyond the left subclavian artery.  A full consultation has been  dictated previously.  The patient has been counseled at length regarding  the indications, risks, and potential benefits of surgery.  Alternative  treatment strategies have been discussed.  She understands and accepts  all associated risks of surgery and desires to proceed as described.   OPERATIVE FINDINGS:  1. Severe atherosclerotic disease with diffuse plaque involving all      epicardial coronary arteries as well as the ascending thoracic      aorta.  The patient should probably not be considered a candidate      for repeat surgical revascularization in the future.  2. Moderate left ventricular hypertrophy with normal left ventricular      systolic function.  3. Intramyocardial left anterior descending coronary artery.  4. Diagonal branch severely diseased and too small for grafting  5. Good-quality left internal mammary artery and saphenous vein      conduit.   OPERATIVE NOTE:  The patient is brought to the operating room on the  above-mentioned date and central monitoring was established  by the  anesthesia service under the care and direction of Dr. Jairo Ben.  Specifically, a Swan-Ganz catheter was placed through the right internal  jugular approach.  A radial arterial line is placed.  Intravenous  antibiotics were administered.  Following induction with general  endotracheal anesthesia, a Foley catheter is placed.  The patient's  chest, abdomen, both groins, and both lower extremities were prepared  and draped in a sterile manner.  Baseline transesophageal echocardiogram  is performed by Dr. Jean Rosenthal.  This demonstrates normal left ventricular  systolic function.  There is moderate left ventricular hypertrophy.  The  aortic valve appears normal.   A median sternotomy incision is  performed and left internal mammary  artery was dissected from the chest wall and prepared for bypass  grafting.  The left internal mammary artery is good-quality conduit.  Simultaneously saphenous vein was obtained from the patient's right  thigh and the upper portion of the right lower leg using endoscopic vein  harvest technique.  The saphenous vein is good-quality conduit.  After  the saphenous vein is removed, the small incisions in the right lower  extremity are closed in multiple layers with running absorbable suture.  The patient is heparinized systemically.  Left internal mammary artery  is transected distally and is noted to have excellent flow.   The pericardium is opened.  The ascending aorta is diffusely diseased  with atherosclerosis, although there are no heart calcifications  apparent and the aorta feels soft and reasonably normal at the level of  the takeoff of the innominate artery.  The ascending aorta is cannulated  at the level of the innominate artery uneventfully.  A temperature probe  is placed in left ventricular septum.  The venous cannula is placed into  the right atrium.  Cardiopulmonary bypass is begun and the surface of  the heart was inspected.  There is mild to moderate left ventricular  hypertrophy.  The left anterior descending coronary artery was deeply  intramyocardial.  There is diffuse coronary artery disease.  The  diagonal branches are all small and diffusely diseased.  Cardioplegic  catheter is placed in the ascending aorta.   The patient is allowed to cool passively to 32 degrees systemic  temperature.  The aortic crossclamp was applied and cold blood  cardioplegia is administered in antegrade fashion through the aortic  root.  Iced saline slush was applied for topical hypothermia.  The  initial cardioplegic arrest and myocardial cooling are felt to be excellent.  Repeat doses of cardioplegia are administered intermittently  throughout the  crossclamp portion of the operation through the aortic  root and down subsequently placed vein grafts to maintain left  ventricular septal temperature below 15 degrees centigrade.   The following distal coronary anastomoses were performed:  1. The posterior descending coronary artery is grafted with a      saphenous vein graft in end-to-side fashion.  This vessel measures      1.5 mm in diameter.  It is diffusely diseased proximally and      moderately diseased distally.  It is a fair-quality target vessel.  2. Circumflex marginal branch is grafted with a saphenous vein graft      in end-to-side fashion.  This vessel measures 1.3 mm in diameter at      the site of distal bypass.  It is diffusely diseased proximally and      moderately diseased distally.  It is a fair quality target.  3. The distal left anterior  descending coronary artery is grafted with      left internal mammary artery in end-to-side fashion.  This vessel      was deeply intramyocardial.  It was located proximally and is      diffusely diseased and a long trench in the anterior wall of the      left ventricle is created and the vessel was followed until the      acceptable site for grafting is found.  At the site of distal      grafting it measures 1.5 mm in diameter.  A 1.5 probe will pass      retrograde, but only a 1.0 probe will pass distally due to diffuse      disease.   Both proximal saphenous vein anastomoses were performed directly to the  ascending aorta prior to removal of the aortic crossclamp.  The aortic  wall was notably quite thick but construction of the proximal  anastomoses were completed uneventfully.  The left ventricular septal  temperature rises rapidly with reperfusion of the left internal mammary  artery.  The patient is placed in steep Trendelenburg position and all  air is evacuated through the aortic root.  The aortic crossclamp was  removed after a total crossclamp time of 84 minutes.  The  heart begins  to beat spontaneously.  All proximal and distal coronary anastomoses  were inspected for hemostasis and appropriate graft orientation.  The  patient rewarmed to 37 degrees centigrade temperature.  The patient  weaned from cardiopulmonary bypass without difficulty.  The patient's  rhythm at separation from bypass is normal sinus rhythm.  No inotropic  support is required.  Total cardiopulmonary bypass time the operation is  102 minutes.  Follow-up transesophageal echocardiogram performed by Dr.  Sampson Goon after separation from bypass demonstrates normal function.  No other abnormalities are noted.   The venous and arterial cannulae are removed uneventfully.  Protamine is  administered to reverse the anticoagulation.  The mediastinum and left  chest are irrigated with saline solution containing vancomycin.  Meticulous surgical hemostasis ascertained.  The mediastinum and left chest are drained with three chest tubes exited through separate stab  incisions inferiorly.  The soft tissues anterior to the aorta are  reapproximated loosely.  The On-Q continuous pain management system is  utilized to facilitate postoperative pain control.  Two 10 inch  catheters supplied with the On-Q kit are tunneled into the deep  subcutaneous tissues and positioned just lateral to the lateral border  of the sternum on either side.  Each catheter was flushed with 5 mL of  0.5% bupivacaine solution and ultimately connected to a continuous  infusion pump.  The sternum was closed with double-strength sternal  wire.  The soft tissues anterior to the sternum are closed in multiple  layers and the skin was closed with a running subcuticular skin closure.   The patient tolerated the procedure well and was transported to the  surgical intensive care unit in stable condition.  There are no  intraoperative complications.  All sponge, instrument and needle counts  were verified correct at completion of the  operation.  No blood products  were administered.   The patient would be felt to be a relatively poor candidate for repeat  surgical revascularization in the future due to diffuse disease of all  coronary arteries as well as intramyocardial left anterior descending  coronary artery and diffusely diseased ascending thoracic aorta.      Salvatore Decent. Cornelius Moras, M.D.  Electronically Signed     CHO/MEDQ  D:  03/05/2006  T:  03/06/2006  Job:  [id number]   cc:   Rollene Rotunda, MD, [facility]  Salvadore Farber, MD

## 2010-07-27 NOTE — Consult Note (Signed)
Boyle, Christina             ACCOUNT NO.:  [id number]   MEDICAL RECORD NO.:  [id number]          PATIENT TYPE:  INP   LOCATION:  2040                         FACILITY:  [facility]   PHYSICIAN:  Salvatore Decent. Cornelius Moras, M.D. DATE OF BIRTH:  1955-03-23   DATE OF CONSULTATION:  03/03/2006  DATE OF DISCHARGE:                                 CONSULTATION   REQUESTING PHYSICIAN:  Dr. Randa Evens.   PRIMARY CARDIOLOGIST:  Dr. Rollene Rotunda.   REASON FOR CONSULTATION:  Left main disease with three-vessel coronary  artery disease.   HISTORY OF PRESENT ILLNESS:  Christina Boyle is a 55 year old mildly obese  white female with no documented previous history of coronary artery  disease but risk factors notable for longstanding tobacco abuse, family  history of premature coronary artery disease and a family history of  severe hyperlipidemia.  The patient has not seen a medical doctor  outside of the emergency room setting for many years.  Over the last 2-3  years, she has had worsening symptoms of exertional chest discomfort and  shortness of breath.  She has sought attention in emergency room on  several occasions and on each such occasion she has been diagnosed with  chronic obstructive pulmonary disease and bronchitis.  She describes  worsening symptoms of exertional shortness of breath with left-sided  chest pain radiating to her left arm and occasionally to her jaw.  This  has been worse in recent months.  Several days ago, the patient  developed a severe allergic reaction to topical exposure to cedar trees  in her yard that was manifested as a severe rash with swelling of her  face around both of her eyes.  She ultimately presented to emergency  room at [facility] where she was given intravenous Solu-Medrol  and epinephrine injection.  Following epinephrine injection, the patient  developed severe substernal chest pain with shortness of breath.  She  was admitted to the hospital and  ruled in for acute myocardial  infarction.  Her symptoms of chest pain resolved with nitrate  administration and resolution of her bolus of epinephrine  administration.  She was transferred to [facility] where she underwent  cardiac catheterization earlier today by Dr. Samule Ohm.  She was found to  have left main disease with severe three-vessel coronary artery disease  and normal left ventricular function.  The patient also was found to  have coarctation of the aorta, although the gradient was best measured  only 30 mmHg.  Cardiac surgical consultation has been requested.   REVIEW OF SYSTEMS:  GENERAL:  The patient reports normal appetite.  She  has gained a little bit of weight recently and she attributes this to  inactivity.  She has been out of work for several years and has been  actively seeking disability.  CARDIAC:  Notable for worsening symptoms  of exertional chest discomfort and shortness of breath.  The patient  reports that she has to sleep laying upright and she gets short of  breath when she tries to lay flat in bed.  She has occasional bilateral  lower extremity edema.  She denies any tachy palpitations.  She denies  syncopal episodes.  She denies resting chest pain other than that which  developed after epinephrine administration.  Since hospitalization here  at [facility] and subsequent to her catheterization, she has not had any  further episodes of chest pain, although she does state that during  transport she had some numbness and discomfort in her left shoulder and  left arm that felt like a toothache.  This has resolved.  RESPIRATORY:  Notable for severe exertional shortness of breath with relatively mild  physical activity.  The patient reports intermittent cough which is dry  and usually nonproductive.  She has been treated for recurrent  bronchitis in the past.  She denies recent purulent sputum production or  hemoptysis.  GASTROINTESTINAL:  Negative.  The patient  has no difficulty  swallowing.  She denies hematochezia, hematemesis, melena.  MUSCULOSKELETAL:  Negative.  The patient denies significant arthritis or  arthralgias.  NEUROLOGIC:  Negative.  GENITOURINARY:  Negative.  INFECTIOUS:  Negative.  HEENT:  Notable for marked improvement in the  previous rash and swelling of the face related to recent exposure and  allergic reaction.   PAST MEDICAL HISTORY:  1. Chronic obstructive pulmonary disease and chronic bronchitis.  2. Longstanding tobacco abuse.   PAST SURGICAL HISTORY:  1. Cholecystectomy.  2. Excision of benign breast lump.  3. Cone biopsy of the cervix.   FAMILY HISTORY:  Notable for a strong presence of premature coronary  artery disease as well as severe hyperlipidemia.   SOCIAL HISTORY:  The patient is married and lives with his son and  husband in [place].  She is out of work.  She has been a smoker for  many years and currently smokes one and a half packs of cigarettes per  day.  She denies excessive alcohol consumption.   MEDICATIONS PRIOR TO ADMISSION:  None.   DRUG ALLERGIES:  None known.   PHYSICAL EXAM:  The patient is a moderately obese white female who  appears her stated age in no acute distress.  HEENT exam is notable for  mild dry, scaly rash and mild swelling which is reportedly much improved  in comparison to previous.  Auscultation of the chest reveals clear  breath sounds that are symmetrical bilaterally.  No wheezes or rhonchi  demonstrated.  Cardiovascular exam includes regular rate and rhythm.  No  murmurs, rubs or gallops are noted.  The abdomen is soft and nontender.  There are no palpable masses.  Bowel sounds are present.  Extremities  are warm and well-perfused.  There is no lower extremity edema.  Distal  pulses are diminished in both lower legs at the ankles.  Rectal and GU exams are both deferred.  Neurologic examination is grossly nonfocal and  symmetrical throughout.   DIAGNOSTIC TEST:   Cardiac catheterization performed by Dr. Samule Ohm today  is reviewed.  This demonstrates left main disease with three-vessel  coronary artery disease and normal left ventricular function as well as  coarctation of the aorta.  There is 70-80% left main coronary stenosis.  There is 70-80% proximal stenosis of the large second diagonal branch.  There is 70-80% stenosis of the left circumflex coronary artery.  There  is 70% stenosis of the distal right coronary artery with 78-80% stenosis  of the posterior descending coronary artery with right dominant coronary  circulation.  Left ventricular function appears normal.  The ascending  aorta is mildly dilated.  There is coarctation of the  aorta just after  takeoff of the left subclavian artery in the most common typical  location.  This is not high-grade coarctation and by report the gradient  measured less than 30 mmHg.  There is mild (30%) stenosis of the left  subclavian artery.  There is no mention of any gradient across the  aortic valve.   Report of 2-D echocardiogram is reviewed and notable for normal left  ventricular function with mild left ventricular hypertrophy and a small  aortic root.  The left ventricular outflow tract estimated at 2.0 cm  with very mild gradient across the aortic valve (peak velocity 205  cm/sec corresponding to peak and mean transvalvular gradients of 17 and  7 mmHg with estimated aortic valve area of 1.8 cm2).   IMPRESSION:  Left main disease with severe three-vessel coronary artery  disease with normal left ventricular function.  Christina Boyle suffered a  very mild non-Q-wave myocardial infarction in the setting of unstable  symptoms stimulated by administration of epinephrine at the time of an  allergic reaction.  The patient appears to have remained stable since  then.  She has normal left ventricular function and clearly would felt  to be treated with surgical revascularization.  I do not favor  concomitant  repair of the aortic coarctation as the patient does not  appear to have any symptoms related to a coarctation at present, she has  no documented history of difficult to control hypertension and  concomitant repair would require either a separate thoracotomy or total  circulatory arrest.  If indicated, this would probably best be treated  after she has recovered from coronary artery bypass surgery due via  percutaneous or left thoracotomy approach.   PLAN:  I have discussed options at length with Christina Boyle and her  family.  Alternative treatment strategies have been discussed.  They  understand and accept all associated risks of surgery including but not  limited to risk of death, stroke, myocardial infarction, congestive  heart failure, respiratory failure, pneumonia, bleeding requiring blood  transfusion, arrhythmia, infection and recurrent coronary artery disease.  All their questions have been addressed.      Salvatore Decent. Cornelius Moras, M.D.  Electronically Signed     CHO/MEDQ  D:  03/03/2006  T:  03/04/2006  Job:  [id number]   cc:   Rollene Rotunda, MD, [facility]  Salvadore Farber, MD  [facility] Cardiology Office

## 2010-07-27 NOTE — Discharge Summary (Signed)
Christina Boyle, Christina Boyle             ACCOUNT NO.:  [id number]   MEDICAL RECORD NO.:  [id number]          PATIENT TYPE:  INP   LOCATION:  2015                         FACILITY:  [facility]   PHYSICIAN:  Salvatore Decent. Cornelius Moras, M.D. DATE OF BIRTH:  1955/12/13   DATE OF ADMISSION:  03/02/2006  DATE OF DISCHARGE:  03/11/2006                               DISCHARGE SUMMARY   Dictated by:  Jerold Coombe, P.A. for Dr. Tressie Stalker   ADMISSION DIAGNOSES:  1. Acute coronary syndrome.  2. Chronic bronchitis.  3. Ongoing tobacco abuse.   DISCHARGE/SECONDARY DIAGNOSES:  1. Left main and three-vessel coronary artery disease status post      coronary artery bypass grafting with recent acute myocardial      infarction.  2. Chronic bronchitis with acute exacerbation postoperatively.  3. Ongoing tobacco abuse.  4. History of cervical cancer status post cone biopsy.  5. History of excision of benign breast lump.  6. History of cholecystectomy.  7. Chronic obstructive pulmonary disease.  8. No known drug allergies.  9. Coarctation of the aorta.  10.Postoperative hyperglycemia with hemoglobin Alc of 5.9.  11.Postoperative acute blood loss anemia requiring transfusion.  12.Mild postoperative fluid volume excess, improved.  13.Hyperlipidemia.   ALLERGIES:  NO KNOWN DRUG ALLERGIES.  Recent facial swelling with  exposure to cedar trees and subsequent myocardial infarction following  administration of epinephrine.   PROCEDURES:  1. March 05, 2006:  Coronary artery bypass grafting x3 using left      internal mammary artery to the distal left anterior descending,      saphenous vein graft to the circumflex marginal branch, saphenous      vein graft to the posterior descending, endoscopic vein harvest      from the right leg, surgeon Dr. Tressie Stalker.  2. March 03, 2006:  Cardiac catheterization by Dr. Randa Evens      showing severe multi-vessel coronary artery disease, coarctation of      the  aorta, and normal left ventricular systolic function.   BRIEF HISTORY:  Christina Boyle is a 55 year old Caucasian female with no  documented previous history of coronary artery disease, but risk factors  are notable for long-standing tobacco abuse, family history of premature  coronary artery disease, and family history of severe hyperlipidemia.  Patient has not seen a medical doctor outside the emergency room setting  for many years.  Over the last 2-3 years she has had worsening symptoms  of exertional chest discomfort and shortness of breath.  She has sought  attention at the emergency room on several occasions and on each  occasion has been diagnosed with chronic obstructive pulmonary disease  and bronchitis.  She describes worsening symptoms of exertional  shortness of breath with left-sided chest pain radiating to her left arm  and occasionally to her jaw.  This has been worse in recent months.  Several days prior to admission she developed a severe allergic reaction  to a topical exposure to cedar trees in her yard that was manifested as  a severe rash with swelling of her face around both of her eyes.  She  ultimately presented to the emergency room at [facility] and was given  intravenous Solu-Medrol and epinephrine injection.  Following the  epinephrine she developed severe substernal chest pain with shortness of  breath.  She was admitted to the hospital and ruled in for acute  myocardial infarction.  The symptoms of chest pain resolved with nitro  administration and resolution of her bolus of epinephrine  administration.  She was transferred to [facility] and plans  were made for her to be transferred to [facility] to undergo  cardiac catheterization.   HOSPITAL COURSE:  Christina Boyle was transferred from [facility] to  [facility] on March 02, 2006 after being treated for acute  coronary syndrome at [facility].  She was started on IV   heparin.  She underwent cardiac catheterization with the results as  discussed above.  Subsequently, she underwent cardiac surgery  consultation by Dr. Tressie Stalker.  He recommended coronary artery  bypass graft surgery.  As mentioned, she was noted to have coarctation  of aorta.  Dr. Cornelius Moras did not favor concomitant repair of her aortic  coarctation as she was asymptomatic and had no documented history of  difficult-to-control hypertension.  Concomitant repair would require  either separate thoracotomy or total circulatory arrest.  He felt that  it would probably be best if this was treated actually after she had  recovered from her coronary artery bypass graft surgery via percutaneous  and a left thoracotomy approach.  After discussing risks and benefits,  she agreed to proceed.  Of note, she did have a 2-D echocardiogram  performed during this hospitalization which showed normal left  ventricular function with mild left ventricular hypertrophy in the small  aortic root.  Left ventricular outflow tract estimated at 2 cm was very  mild gradient across the aortic valve (peak velocity 205 cm per second  corresponding to peak and mean transvalvular gradients of 17 to 7 mmHg  with estimated aortic valve area of 1.8 cm squared).   Patient was taken to the operating room on March 05, 2006.  Postoperatively she was transferred to the Surgical Intensive Care Unit.  She remained on the ventilator overnight but extubated on the morning of  postoperative day 1.  Chest tubes and monitors were discontinued in the  usual fashion.  She was transferred from the Surgical Intensive Care  Unit on postoperative 2.  She did require 1 unit of packed red blood  cells for postoperative anemia with hemoglobin of 8.3 and hematocrit  24.5.  She also had some postoperative nausea but it improved after  discontinuing narcotics medication.  Her blood pressure tolerated a low- dose beta blocker.  ACE inhibitor were  avoided with her coarctations of  the aorta.  Postoperative day 3 she was on telemetry in 2000 where she  remained until discharge.  She has made steady progress, although did  require Avelox for probable tracheal bronchitis.  A sputum culture was  sent; however, it so far showed no growth.  She also was treated with  Tussionex and Nasonex as well.  Her vitals have remained stable.  Currently the blood pressure is ranging systolic blood pressure between  115 and 140 and diastolic in the 60s to 70s.  Heart rate has been in the  70s and 80s and sinus rhythm.  She has been febrile and saturating above  92% on room air.  Her hemoglobin has remained stable following her  transfusion.  She was managed with sliding scale insulin for  postoperative hyperglycemia but sugars were normalized on postoperative  day 4 and 5 and discontinued.  She was resumed on a Heart Healthy diet.  Her bladder and bowel have been functioning appropriate.  She has been  able to ambulate with cardiac rehab making progress.  Pain has been  controlled with oral medication.  Her incisions appear to be healing  without signs of infection.  From a pulmonary standpoint, she also  appears to be improving.  Her last chest x-ray shows some residual left  lung atelectasis but no pneumothorax.  If she continues to remain stable  from a pulmonary standpoint and progresses with mobility, it is  anticipated she will be discharged home on postoperative day 6, March 11, 2006.   LABORATORY DATA:  Most recent labs show a white blood count of 10.6 but  it is trending down, hemoglobin 5.2, hematocrit 26.7 which has been  trending up, platelet count 244, sodium 134, potassium 3.9, chloride 99,  CO2 of 27, BUN 11, creatinine 0.7, blood glucose 74, AST 36, ALT  slightly elevated at 43, alkaline phosphatase 66, total bilirubin 1.0,  amylase decreased at 17, lipase 16, and albumin 2.9.  Her hemoglobin Alc  on December 25 was 5.9.    DISCHARGE MEDICATIONS:  1. Coated aspirin 325 mg p.o. daily.  2. Toprol-XL 25 mg p.o. daily.  3. Lipitor 80 mg p.o. q.p.m.  4. Avelox 40 mg p.o. daily x3 more days.  5. Tussionex 5 mL p.o. q.12h. p.r.n. for cough.  6. Ultram 50 mg one to two tabs p.o. q.6h. p.r.n. pain.   DISCHARGE INSTRUCTIONS:  She is to avoid driving or heavy lifting more  than 10 pounds.  She is to continue daily walking and breathing  exercises.  She is to follow a low-fat/low-salt diet.  She may shower  and clean her incisions gently with soap and water.  She is to notify  the [facility] office if she develops fever greater than 101 or redness or  drainage from her incision site or increased shortness of breath or  edema.   FOLLOWUP:  1. She is to follow up with Dr. Cornelius Moras in the CVTS office on April 07, 2006 at 2:15 p.m.  2. She is to follow up with Dr. Harold Barban at [facility]      [place] office on March 24, 2006 at 10 a.m. with a chest x-ray     at this appointment.      Salvatore Decent. Cornelius Moras, M.D.  Electronically Signed     CHO/MEDQ  D:  03/10/2006  T:  03/11/2006  Job:  [id number]   cc:   Salvatore Decent. Cornelius Moras, M.D.  Gerrit Friends. Dietrich Pates, MD, [facility]  Rollene Rotunda, MD, [facility]

## 2010-07-27 NOTE — Assessment & Plan Note (Signed)
[facility]                       [place] [facility]IOLOG[facility] OFFICE NOTE   Christina Boyle, Christina Boyle                    MRN:          [id number]  DATE:03/24/2006                            DOB:          [birth year]    Christina Boyle returns to the office following a recent admission to [facility] for an acute MI, occurring after administration of  epinephrine for an allergic reaction.  She was found to have left native  three-vessel disease prompting CABG surgery.  She also had a coarctation  with a 30-mm gradient, but this was not approached surgically.  Finally,  her echocardiogram suggested mild aortic stenosis, which was also  thought not to require surgical intervention.  She did fine with the  operation, except for some mild pulmonary complications, and is doing  well since discharge.   CURRENT MEDICATIONS:  Include:  1. Aspirin 345 mg q.d.  2. Toprol 25 mg q.d.  3. Atorvastatin 80 mg q.d.  4. Avelox 400 mg q.d.   EXAMINATION:  GENERAL:  Pleasant woman in no acute distress.  VITAL SIGNS:  Weight is 163 pounds, blood pressure 130/65, heart rate 85  but regular, respirations 16.  NECK:  No jugular venous distention; left carotid bruit.  LUNGS:  Clear.  CARDIAC:  Normal first and second heart sounds; barely audible systolic  murmur.  ABDOMEN:  Soft and nontender; minimal drainage from chest tube sites.  EXTREMITIES:  Mild ecchymoses; healing incisions.  No edema.   EKG:  Normal sinus rhythm; inferior ST segment elevation approximately 1  mm; diffuse T-wave inversion.   IMPRESSION:  Christina Boyle is doing well postoperatively.  She plans to  continue to refrain from cigarette smoking.  She could not afford  cardiac rehabilitation.  We will check to see whether she could be  accommodated nonetheless.  Her husband is cared for by Dr. Sherwood Gambler.  I  suggested she seek primary care from that group.  I will see this nice  woman again in 2 months.  She  is scheduled to see her surgeon, Dr.  Barry Dienes, in 2 weeks.     Gerrit Friends. Dietrich Pates, MD, [facility]  Electronically Signed    RMR/MedQ  DD: 03/24/2006  DT: 03/24/2006  Job #: [id number]

## 2010-07-27 NOTE — H&P (Signed)
[name], Boyle             ACCOUNT NO.:  [id number]   MEDICAL RECORD NO.:  [id number]          PATIENT TYPE:  INP   LOCATION:  2910                         FACILITY:  [facility]   PHYSICIAN:  Reginia Forts, MD     DATE OF BIRTH:  May 10, 1955   DATE OF ADMISSION:  03/02/2006  DATE OF DISCHARGE:                              HISTORY & PHYSICAL   CHIEF COMPLAINT:  Chest pain.   HISTORY OF PRESENT ILLNESS:  [name] is a 55 year old Caucasian  female with no prior cardiac history but a strong family history of  cardiac disease who presents to the hospital transfer for chest pain.  The patient has had exertional chest pain for the last one year and has  thought the pain was secondary to a bronchitis.  Her chest pressure  would be relieved with rest and she has noted dyspnea on exertion.  There has been no progression of her symptoms for the last three months.  Earlier yesterday, she developed an ALLERGIC SKIN REACTION TO [place]  BARK after having contact at her house.  She presented to [facility]  Emergency Room with mild lip swelling and facial rash and was given Solu-  Medrol and epinephrine injections.  On her way home, she developed  substernal chest pressure and returned back to the emergency room.  Her  chest pain resolved with sublingual nitroglycerin.  Troponin was noted  to be 0.17.  Repeat level was 0.78.  She remained chest pain-free after  the sublingual nitroglycerin and nitro paste.  She was subsequently  transferred to [facility] for further management.   PAST MEDICAL HISTORY:  Notable for chronic bronchitis.   PAST SURGICAL HISTORY:  1. History of cervical cancer, status post removal.  2. Left breast lump, status post removal and a cholecystectomy.   ALLERGIES:  NO KNOWN DRUG ALLERGIES.   MEDICATIONS:  None until tonight.  Has received Methylprednisolone 4 mg  p.o. b.i.d. for 7 days and Vistaril 25 mg p.o. q.4 hours p.r.n.   SOCIAL HISTORY:  The patient  lives in [place] with her husband. She  is a housewife.  She smokes 1-1/2 packs per day for the last 36 years.  Denies any alcohol or drug use.   FAMILY HISTORY:  Notable for a father who had an MI at age 58 and a  niece who recently expired at age 13 due to an MI.   REVIEW OF SYSTEMS:  Notable for orthopnea, cough, and occasional  wheezing.  Rest of review of systems was reviewed and negative.   VITAL SIGNS:  Temperature 98.  Pulse 90.  Blood pressure 147/78.  Respiratory rate 16.  Sating 97% on 2 liters.  GENERAL:  The patient is awake, alert, oriented x3.  HEENT:  Normocephalic, atraumatic.  Pupils equal and round, reactive to  light.  Extraocular movements are intact.  NECK:  Shows no JVD.  No carotid bruits.  CARDIOVASCULAR:  Regular rhythm with a normal rate with a 2/6  holosystolic murmur at the left sternal border and radiating to the  apex.  LUNGS:  Demonstrate mild bibasilar rales.  ABDOMEN:  Positive bowel sounds, soft, nontender, nondistended.  EXTREMITIES:  Show no cyanosis, clubbing, or edema.  There is 1+ right  femoral pulse with no bruit, and a 2+ left femoral pulse, and 2+ distal  pulses.  SKIN:  Demonstrates erythema on the face on both buccal surfaces,  greater on the right than the left.  MUSCULOSKELETAL:  Demonstrates no significant tenderness.  NEUROVASCULAR:  Cranial nerves II through XII grossly intact.  No focal  skull or sensory deficits.   Chest x-ray is pending.  EKG demonstrates a normal sinus rhythm with a heart rate of 90 with  transient lateral 0.5 mm to 1.0 mm ST depressions.  Labs done showed a white count of 13.4, hemoglobin of 14.2, platelets of  234,000, BUN 5, creatinine 1, troponin of 0.17 up to 0.78, CK 135, MB  13.5.   ASSESSMENT AND PLAN:  This is a 55 year old Caucasian woman with acute  coronary syndrome with likely chronic angina, now precipitated by an  epinephrine injection.   1. Acute coronary syndrome.  We will continue  Heparin.  The patient      has been started on beta-blocker, ACE inhibitor, and a Statin.  I      have discussed the high likelihood that the patient will require      cardiac cath, she understands the procedure.  2. Bronchitis.  This appears to be currently stable.  We will give her      nebulizer treatments p.r.n.  3. Smoking cessation.  The patient has been advised to quit.      Reginia Forts, MD  Electronically Signed     RA/MEDQ  D:  03/02/2006  T:  03/02/2006  Job:  [id number]

## 2010-07-27 NOTE — Cardiovascular Report (Signed)
NAMEJODYE, Christina Boyle             ACCOUNT NO.:  [id number]   MEDICAL RECORD NO.:  [id number]          PATIENT TYPE:  INP   LOCATION:  2040                         FACILITY:  [facility]   PHYSICIAN:  Salvadore Farber, MD  DATE OF BIRTH:  08-Jul-1955   DATE OF PROCEDURE:  03/03/2006  DATE OF DISCHARGE:                            CARDIAC CATHETERIZATION   PROCEDURE:  1. Left heart catheterization.  2. Left ventriculography.  3. Coronary angiography.  4. Arch aortography.  5. Pressure pullback across a coarctation of the aorta.   INDICATIONS:  Christina Boyle is a 55 year old woman with cardiac risk  factors of family history of premature atherosclerotic coronary disease  and ongoing tobacco abuse.  She is not known to have hypertension  previously.   Over the past 3 months, she has had exertional chest pressure associated  with exertional dyspnea.  On December 23, she presented to the [facility]  Emergency Room with an allergic reaction to [facility].  She was  treated with Solu-Medrol and epinephrine.  While going home, she had  chest pain at rest and returned to the emergency room.  Pain resolved  with sublingual nitroglycerin.  Troponin was elevated at 0.17.  She was  transferred and referred for cardiac catheterization.  She has had no  recurrent chest pain since admission.   PROCEDURAL TECHNIQUE:  Informed consent was obtained.  Under 1%  lidocaine local anesthesia, a 5-French sheath was placed in the right  common femoral artery using the modified Seldinger technique.  I had  some mild difficulty passing the J wire into the aortic arch, suggesting  a stenosis in the aorta just beyond the left subclavian artery  consistent with coarctation.  I was able to manipulate the wire past it.  I then performed coronary angiography using JL-4 and JR-4 catheters.  Left heart catheterization and ventriculography were performed using a  pigtail catheter. To investigate the possibility of the  coarctation, I  then positioned a pigtail catheter in the ascending aorta and performed  arch aortography.  This confirmed a coarctation.  I then re-advanced the  JR-4 catheter into the ascending aorta and then pulled it back across  the coarctation while measuring pressures.  I also performed selective  angiography of the subclavian artery using the JR-4 catheter.  The  patient tolerated the procedure well and was transferred to the holding  room in stable condition.  Sheath was removed there.   COMPLICATIONS:  None.   FINDINGS:  1. LV:  143/17/26.  EF 65% without regional wall motion abnormality.  2. No aortic stenosis or mitral regurgitation.  3. Left main:  There is an 80% stenosis of the mid vessel.  4. LAD:  Moderate-size vessel giving rise to two small proximal      diagonals and a larger third diagonal.  The LAD has heavy      calcification proximally and diffuse mild disease.  The third      diagonal has a 70% stenosis proximally.  A small medial subdivision      of this has a 99% stenosis.  5. Circumflex:  Moderate-sized  vessel giving rise to a single      marginal.  There is a 70% stenosis in the AV groove portion of the      vessel.  6. RCA:  Moderate-size, dominant vessel.  There is an 80% ostial      stenosis, a 40% stenosis of the mid vessel, and a 90% stenosis just      before the origin of the PDA.  7. Left subclavian artery:  Diffuse 30% stenosis proximally.  The LIMA      is widely patent.  8. Aortic coarctation with 30 mmHg translesional gradient.  9. Normal great vessel anatomy.  There is mild ostial stenosis of the      left common carotid artery.   IMPRESSION/RECOMMENDATIONS:  1. Severe multivessel coronary disease.  2. Coarctation of aorta.  3. Normal left ventricular systolic function.   Will refer the to cardiac surgery for consideration of coronary artery  bypass grafting and repair of her coarctation.      Salvadore Farber, MD  Electronically  Signed     WED/MEDQ  D:  03/03/2006  T:  03/03/2006  Job:  [id number]   cc:   Rollene Rotunda, MD, [facility]

## 2010-07-31 ENCOUNTER — Ambulatory Visit (HOSPITAL_COMMUNITY): Payer: Self-pay

## 2010-08-07 ENCOUNTER — Ambulatory Visit (HOSPITAL_COMMUNITY)
Admission: RE | Admit: 2010-08-07 | Discharge: 2010-08-07 | Disposition: A | Discharge status: Home / Self Care | Payer: Self-pay | Source: Ambulatory Visit | Attending: Cardiology | Admitting: Cardiology

## 2010-08-07 DIAGNOSIS — I517 Cardiomegaly: Secondary | ICD-10-CM

## 2010-08-07 DIAGNOSIS — I10 Essential (primary) hypertension: Secondary | ICD-10-CM | POA: Insufficient documentation

## 2011-03-19 ENCOUNTER — Other Ambulatory Visit: Payer: Self-pay | Admitting: Adult Health

## 2011-03-19 MED ORDER — SIMVASTATIN 80 MG PO TABS: 80.0000 mg | ORAL_TABLET | Freq: Every day | ORAL | Status: DC

## 2011-03-19 NOTE — Telephone Encounter (Signed)
Please call rx into [facility] pt has no refills left and not due to follow up till 07/2011/tmj

## 2011-07-31 ENCOUNTER — Ambulatory Visit (INDEPENDENT_AMBULATORY_CARE_PROVIDER_SITE_OTHER): Payer: Self-pay | Admitting: Physician Assistant

## 2011-07-31 ENCOUNTER — Encounter: Payer: Self-pay | Admitting: Physician Assistant

## 2011-07-31 VITALS — BP 184/65 | HR 67 | Ht 62.0 in | Wt 208.0 lb

## 2011-07-31 DIAGNOSIS — R0989 Other specified symptoms and signs involving the circulatory and respiratory systems: Secondary | ICD-10-CM

## 2011-07-31 DIAGNOSIS — I10 Essential (primary) hypertension: Secondary | ICD-10-CM | POA: Insufficient documentation

## 2011-07-31 DIAGNOSIS — E785 Hyperlipidemia, unspecified: Secondary | ICD-10-CM

## 2011-07-31 DIAGNOSIS — I2510 Atherosclerotic heart disease of native coronary artery without angina pectoris: Secondary | ICD-10-CM

## 2011-07-31 MED ORDER — LISINOPRIL 20 MG PO TABS: 20.0000 mg | ORAL_TABLET | Freq: Every day | ORAL | Status: DC

## 2011-07-31 NOTE — Progress Notes (Signed)
HPI:  This is a 56 year old female patient with history of coronary artery disease status post CABG in 2007. She is here for one year follow-up. She had a 2-D echo done on 08/08/10 because of a systolic heart murmur. This showed mild to moderate LVH ejection fraction 60-65% with mild to moderate aortic valve calcification with trivial aortic stenosis.She complains of chronic dyspnea on exertion that has not changed over the past year she continues to smoke 6-7 cigarettes daily. Her blood pressure is elevated and she does admit to eating a lot of salt. She says her blood pressure is up whenever she gets it checked at [facility]. She does not get any regular exercise.   No Known Allergies  Current Outpatient Prescriptions on File Prior to Visit: aspirin 325 MG tablet, Take 325 mg by mouth daily.  , Disp: , Rfl:  chlorthalidone (HYGROTON) 25 MG tablet, Take 25 mg by mouth daily.  , Disp: , Rfl:  metoprolol (LOPRESSOR) 100 MG tablet, Take 0.5 tablets (50 mg total) by mouth 2 (two) times daily., Disp: 30 tablet, Rfl: 11 simvastatin (ZOCOR) 80 MG tablet, Take 1 tablet (80 mg total) by mouth at bedtime., Disp: 30 tablet, Rfl: 5    Past Medical History:   ASCVD (arteriosclerotic cardiovascular disease) 02/2006        Comment:coronary artery bypass graft surgery   Tobacco abuse                                                  Comment:discontinued in 03/2006 ;50 pack year               consumption resumed   Chronic bronchitis                                           Coarctation of aorta                                         Cervical cancer                                              Hyperlipidemia                                              Past Surgical History:   left breast biopsy                                             Comment:for benign disease   CHOLECYSTECTOMY                                              CORONARY ARTERY BYPASS GRAFT  02/2006      cervical carcinoma                                           No family history on file.   Social History   Marital Status: Legally Separated   Spouse Name:                      Years of Education:                 Number of children:             Occupational History   None on file  Social History Main Topics   Smoking Status: Current Everyday Smoker         Packs/Day: 1     Years: 50        Types: Cigarettes     Last Attempt to quit: 03/25/2006   Smokeless Status: Never Used                       Alcohol Use: No             Drug Use: No             Sexual Activity: Not on file        Other Topics            Concern   None on file  Social History Narrative   None on file    ROS: See HPI Eyes: Negative Ears:Negative for hearing loss, tinnitus Cardiovascular: Negative for chest pain, palpitations,irregular heartbeat,   near-syncope, orthopnea, paroxysmal nocturnal dyspnea and syncope,edema, claudication, cyanosis,.  Respiratory:   Positive for chronic cough,dyspnea on exertion and some wheezing, negative for hemoptysis   Endocrine: Negative for cold intolerance and heat intolerance.  Hematologic/Lymphatic: Negative for adenopathy and bleeding problem. Does not bruise/bleed easily.  Musculoskeletal: Negative.   Gastrointestinal: Negative for nausea, vomiting, reflux, abdominal pain, diarrhea, constipation.   Neurological: Negative.  Allergic/Immunologic: Negative for environmental allergies.    PHYSICAL EXAM: Well-nournished, in no acute distress. Neck: bilateral carotid bruits,No JVD, HJR,or thyroid enlargement  Lungs: decreased breath sounds throughout with scattered wheezes,No tachypnea, without rales, or rhonchi  Cardiovascular: RRR, PMI not displaced, 2/6 harsh systolic murmur at the left sternal border, normal S1-S2, no gallops, bruit, thrill, or heave.  Abdomen: BS normal. Soft without organomegaly, masses, lesions or tenderness.  Extremities: without cyanosis, clubbing or edema. Good  distal pulses bilateral  SKin: Warm, no lesions or rashes   Musculoskeletal: No deformities  Neuro: no focal signs  There were no vitals taken for this visit.   ZOX:WRUEAV sinus rhythm with first degree AV block and T wave changes laterally, nonspecific ST-T wave changes throughout. No acute change

## 2011-07-31 NOTE — Assessment & Plan Note (Signed)
Advised to quit smoking °

## 2011-07-31 NOTE — Assessment & Plan Note (Signed)
Patient has history of CABG in 2007. She has chronic dyspnea on exertion that has not changed over the years. She denies any chest pain or other cardiac complaints. No further testing at this time.

## 2011-07-31 NOTE — Assessment & Plan Note (Signed)
Patient's blood pressure is quite elevated today. She does use a lot of salt on her foods. We will give her 2 g sodium diet and add lisinopril 20 mg daily. She has been on this medication in the past and can't recall why it was stopped. We will also check a BMP.

## 2011-07-31 NOTE — Patient Instructions (Addendum)
**Note De-Identified Merve Hotard Obfuscation** Your physician has requested that you have a carotid duplex. This test is an ultrasound of the carotid arteries in your neck. It looks at blood flow through these arteries that supply the brain with blood. Allow one hour for this exam. There are no restrictions or special instructions.  Your physician has recommended you make the following change in your medication: start taking Lisinopril 20 mg daily  Your physician recommends that you return for lab work in: this week  Your physician discussed the hazards of tobacco use. Tobacco use cessation is recommended and techniques and options to help you quit were discussed.  Your provider recommends that you start a 2 gram Sodium diet, please see handout given to you at today's visit  Your physician recommends that you schedule a follow-up appointment in: 2 months

## 2011-07-31 NOTE — Assessment & Plan Note (Signed)
Patient has bilateral carotid bruits. We will check carotid Dopplers. 

## 2011-07-31 NOTE — Assessment & Plan Note (Signed)
Patient has not had her lipids checked in a long time. We'll order a fasting lipid panel and LFTs.

## 2011-08-01 ENCOUNTER — Ambulatory Visit: Payer: Self-pay | Admitting: Adult Health

## 2011-08-06 ENCOUNTER — Ambulatory Visit (HOSPITAL_COMMUNITY)
Admission: RE | Admit: 2011-08-06 | Discharge: 2011-08-06 | Disposition: A | Discharge status: Home / Self Care | Payer: Self-pay | Source: Ambulatory Visit | Attending: Physician Assistant | Admitting: Physician Assistant

## 2011-08-06 DIAGNOSIS — I10 Essential (primary) hypertension: Secondary | ICD-10-CM | POA: Insufficient documentation

## 2011-08-06 DIAGNOSIS — R0989 Other specified symptoms and signs involving the circulatory and respiratory systems: Secondary | ICD-10-CM | POA: Insufficient documentation

## 2011-08-06 DIAGNOSIS — I2510 Atherosclerotic heart disease of native coronary artery without angina pectoris: Secondary | ICD-10-CM | POA: Insufficient documentation

## 2011-08-06 DIAGNOSIS — F17200 Nicotine dependence, unspecified, uncomplicated: Secondary | ICD-10-CM | POA: Insufficient documentation

## 2011-08-09 ENCOUNTER — Encounter: Payer: Self-pay | Admitting: Cardiology

## 2011-08-16 ENCOUNTER — Telehealth: Payer: Self-pay | Admitting: Cardiology

## 2011-08-16 NOTE — Telephone Encounter (Signed)
PT WANTS TEST RESULTS/TMJ

## 2011-08-16 NOTE — Telephone Encounter (Signed)
**Note De-identified Jamerson Vonbargen Obfuscation** Pt. Advised, she verbalized understanding./LV 

## 2011-09-03 ENCOUNTER — Other Ambulatory Visit: Payer: Self-pay | Admitting: *Deleted

## 2011-09-03 MED ORDER — METOPROLOL TARTRATE 100 MG PO TABS: 50.0000 mg | ORAL_TABLET | Freq: Two times a day (BID) | ORAL | Status: DC

## 2011-10-01 ENCOUNTER — Other Ambulatory Visit: Payer: Self-pay | Admitting: Cardiology

## 2011-10-01 MED ORDER — METOPROLOL TARTRATE 100 MG PO TABS: 50.0000 mg | ORAL_TABLET | Freq: Two times a day (BID) | ORAL | Status: DC

## 2011-10-01 MED ORDER — SIMVASTATIN 80 MG PO TABS: 80.0000 mg | ORAL_TABLET | Freq: Every day | ORAL | Status: DC

## 2011-10-03 ENCOUNTER — Ambulatory Visit: Payer: Self-pay | Admitting: Cardiology

## 2011-10-30 ENCOUNTER — Other Ambulatory Visit: Payer: Self-pay | Admitting: Cardiology

## 2011-10-30 NOTE — Telephone Encounter (Signed)
PLEASE CALL IN FOR 90 DAYS

## 2011-10-31 MED ORDER — METOPROLOL TARTRATE 100 MG PO TABS: 50.0000 mg | ORAL_TABLET | Freq: Two times a day (BID) | ORAL | Status: DC

## 2011-10-31 MED ORDER — LISINOPRIL 20 MG PO TABS: 20.0000 mg | ORAL_TABLET | Freq: Every day | ORAL | Status: DC

## 2011-10-31 MED ORDER — SIMVASTATIN 80 MG PO TABS: 80.0000 mg | ORAL_TABLET | Freq: Every day | ORAL | Status: DC

## 2012-05-14 ENCOUNTER — Other Ambulatory Visit: Payer: Self-pay | Admitting: Cardiology

## 2012-06-04 ENCOUNTER — Other Ambulatory Visit: Payer: Self-pay | Admitting: Cardiology

## 2012-12-08 ENCOUNTER — Ambulatory Visit (INDEPENDENT_AMBULATORY_CARE_PROVIDER_SITE_OTHER): Payer: Medicaid Other | Admitting: Adult Health

## 2012-12-08 ENCOUNTER — Encounter: Payer: Self-pay | Admitting: Adult Health

## 2012-12-08 VITALS — BP 191/81 | HR 87 | Ht 64.0 in | Wt 200.0 lb

## 2012-12-08 DIAGNOSIS — I2510 Atherosclerotic heart disease of native coronary artery without angina pectoris: Secondary | ICD-10-CM

## 2012-12-08 DIAGNOSIS — R011 Cardiac murmur, unspecified: Secondary | ICD-10-CM

## 2012-12-08 DIAGNOSIS — I10 Essential (primary) hypertension: Secondary | ICD-10-CM

## 2012-12-08 MED ORDER — LISINOPRIL-HYDROCHLOROTHIAZIDE 20-12.5 MG PO TABS: 1.0000 | ORAL_TABLET | Freq: Every day | ORAL | Status: DC

## 2012-12-08 MED ORDER — OLMESARTAN MEDOXOMIL-HCTZ 20-12.5 MG PO TABS: 1.0000 | ORAL_TABLET | Freq: Every day | ORAL | Status: DC

## 2012-12-08 NOTE — Assessment & Plan Note (Signed)
Most recent doppler ultrasound was negative for significant stenosis one year ago. Will not pursue another test, with the exception of echo, as AoV stenosis is suspected.

## 2012-12-08 NOTE — Patient Instructions (Addendum)
Your physician recommends that you schedule a follow-up appointment in: 3 months  Your physician has requested that you have an echocardiogram. Echocardiography is a painless test that uses sound waves to create images of your heart. It provides your doctor with information about the size and shape of your heart and how well your heart's chambers and valves are working. This procedure takes approximately one hour. There are no restrictions for this procedure.  WE WILL CALL YOU WITH YOUR TEST RESULTS/INSTRUCTIONS/NEXT STEPS ONCE RECEIVED BY THE PROVIDER   PLEASE GO TO THE MAIN ENTRANCE HERE IN THE HOSPITAL AND ASK FOR BETTY RATLIFF TO BE SIGNED UP FOR ASSISTANCE WITH [facility] [place]  Your physician has recommended you make the following change in your medication:   1) STOP TAKING LISINOPRIL 2) START TAKING LISINOPRIL 20/12.5 ONCE DAILY  3) YOU CAN TAKE THE BENICAR/HCTZ 20/12.5MG  ONCE DAILY PER SAMPLES GIVEN TODAY AT YOUR OFFICE VISIT, ONCE SAMPLES ARE COMPLETED YOU CAN PICK UP THE LISINOPRIL/HCTZ ON THE FOUR DOLLAR MEDICATION LIST WITH [facility]

## 2012-12-08 NOTE — Assessment & Plan Note (Signed)
She is hypertensive today. She has been out of her medications for 1 1/2 weeks due to financial issues. I have given her samples of Benicar 20/12.5 mg daily. She will have echocardiogram due to systolic murmur, and for LV fx. She has not had echo since 2007. She will get Rx for lisinopril/HCTZ at [facility] when her money comes in for the month. She is counseled on compliance with medications.

## 2012-12-08 NOTE — Progress Notes (Deleted)
Name: Christina Boyle    DOB: May 08, 1955  Age: 57 y.o.  MR#: [id number]       PCP:  No primary provider on file.      Insurance: Payor: MEDICAID [place] / Plan: MEDICAID OF [place] / Product Type: *No Product type* /   CC:    Chief Complaint  Patient presents with  . Coronary Artery Disease    CABG 2007  . Hypertension  PT NOTES SHE STOPPED TAKING THE CHLORTHALIDONE MONTHS AGO BECAUSE IT MADE HER SICK   VS Filed Vitals:   12/08/12 1410  BP: 191/81  Pulse: 87  Height: 5\' 4"  (1.626 m)  Weight: 200 lb (90.719 kg)    Weights Current Weight  12/08/12 200 lb (90.719 kg)  07/31/11 208 lb (94.348 kg)  07/25/10 197 lb (89.359 kg)    Blood Pressure  BP Readings from Last 3 Encounters:  12/08/12 191/81  07/31/11 184/65  07/25/10 189/78     Admit date:  (Not on file) Last encounter with RMR:  Visit date not found   Allergy Review of patient's allergies indicates no known allergies.  Current Outpatient Prescriptions  Medication Sig Dispense Refill  . aspirin 81 MG tablet Take 81 mg by mouth daily.      Marland Kitchen lisinopril (PRINIVIL,ZESTRIL) 20 MG tablet TAKE ONE TABLET BY MOUTH EVERY DAY  90 tablet  0  . metoprolol (LOPRESSOR) 100 MG tablet Take 0.5 tablets (50 mg total) by mouth 2 (two) times daily.  30 tablet  11  . simvastatin (ZOCOR) 80 MG tablet TAKE ONE TABLET BY MOUTH NIGHTLY AT BEDTIME  30 tablet  4   No current facility-administered medications for this visit.    Discontinued Meds:    Medications Discontinued During This Encounter  Medication Reason  . aspirin 325 MG tablet Error  . chlorthalidone (HYGROTON) 25 MG tablet Error    Patient Active Problem List   Diagnosis Date Noted  . Hypertension 07/31/2011  . Carotid bruit 07/31/2011  . CERVICAL CANCER 04/04/2009  . HYPERLIPIDEMIA 04/04/2009  . TOBACCO ABUSE 04/04/2009  . ATHEROSCLEROTIC CARDIOVASCULAR DISEASE 04/04/2009  . COARCTATION OF AORTA 04/04/2009  . FATIGUE 04/04/2009    LABS    Component Value Date/Time   NA 139 04/21/2009 2039   K 3.8 04/21/2009 2039   CL 101 04/21/2009 2039   CO2 24 04/21/2009 2039   GLUCOSE 123* 04/21/2009 2039   BUN 11 04/21/2009 2039   CREATININE 0.83 04/21/2009 2039   CALCIUM 9.8 04/21/2009 2039   CMP     Component Value Date/Time   NA 139 04/21/2009 2039   K 3.8 04/21/2009 2039   CL 101 04/21/2009 2039   CO2 24 04/21/2009 2039   GLUCOSE 123* 04/21/2009 2039   BUN 11 04/21/2009 2039   CREATININE 0.83 04/21/2009 2039   CALCIUM 9.8 04/21/2009 2039   PROT 7.3 04/21/2009 2039   ALBUMIN 4.6 04/21/2009 2039   AST 25 04/21/2009 2039   ALT 32 04/21/2009 2039   ALKPHOS 80 04/21/2009 2039   BILITOT 0.4 04/21/2009 2039       Component Value Date/Time   WBC 8.5 04/21/2009 2039   HGB 16.2* 04/21/2009 2039   HCT 48.5* 04/21/2009 2039   MCV 93.6 04/21/2009 2039    Lipid Panel     Component Value Date/Time   CHOL 312* 04/21/2009 2039   TRIG 153* 04/21/2009 2039   HDL 43 04/21/2009 2039   CHOLHDL 7.3 Ratio 04/21/2009 2039   VLDL 31 04/21/2009 2039  LDLCALC 238* 04/21/2009 2039    ABG No results found for this basename: phart, pco2, pco2art, po2, po2art, hco3, tco2, acidbasedef, o2sat     Lab Results  Component Value Date   TSH 3.042 04/21/2009   BNP (last 3 results) No results found for this basename: PROBNP,  in the last 8760 hours Cardiac Panel (last 3 results) No results found for this basename: CKTOTAL, CKMB, TROPONINI, RELINDX,  in the last 72 hours  Iron/TIBC/Ferritin No results found for this basename: iron, tibc, ferritin     EKG Orders placed in visit on 12/08/12  . EKG 12-LEAD     Prior Assessment and Plan Problem List as of 12/08/2012   CERVICAL CANCER   HYPERLIPIDEMIA   Last Assessment & Plan   07/31/2011 Office Visit Written 07/31/2011  1:42 PM by Dyann Kief, PA     Patient has not had her lipids checked in a long time. We'll order a fasting lipid panel and LFTs.    TOBACCO ABUSE   Last Assessment & Plan   07/31/2011 Office Visit Written 07/31/2011  1:42  PM by Dyann Kief, PA     Advised to quit smoking    ATHEROSCLEROTIC CARDIOVASCULAR DISEASE   Last Assessment & Plan   07/31/2011 Office Visit Written 07/31/2011  1:35 PM by Dyann Kief, PA     Patient has history of CABG in 2007. She has chronic dyspnea on exertion that has not changed over the years. She denies any chest pain or other cardiac complaints. No further testing at this time.    COARCTATION OF AORTA   FATIGUE   Hypertension   Last Assessment & Plan   07/31/2011 Office Visit Written 07/31/2011  1:40 PM by Dyann Kief, PA     Patient's blood pressure is quite elevated today. She does use a lot of salt on her foods. We will give her 2 g sodium diet and add lisinopril 20 mg daily. She has been on this medication in the past and can't recall why it was stopped. We will also check a BMP.    Carotid bruit   Last Assessment & Plan   07/31/2011 Office Visit Written 07/31/2011  1:43 PM by Dyann Kief, PA     Patient has bilateral carotid bruits. We will check carotid Dopplers.        Imaging: No results found.

## 2012-12-08 NOTE — Assessment & Plan Note (Signed)
She unfortunately continues to smoke, despite numerous warnings not to. I have again counseled her on cessation and risks for stroke and CAD restenosis. She verbalizes understanding but states she is addicted and would rather buy the cigarettes instead of medications when money is available.

## 2012-12-08 NOTE — Progress Notes (Signed)
    HPI: Christina Boyle is a 57 year old former patient of Dr. Dietrich Pates we are following up in the office for ongoing assessment and management of CAD status post coronary artery bypass grafting in 2007, with history of hyperlipidemia carotid bruit. The patient was last seen in the office in May of 2013 by Dayton Scrape PA, with some complaints of dyspnea on exertion although this was not significant. The patient's blood pressure was elevated on her last visit, and lisinopril 20 mg was added to her regimen that day. Blood pressure was 184/65.. A followup BMET was checked post office visit, but not completed.    She comes today without medications for 1 1/2 weeks. She did not get labs due to financial constraints. She unfortunately continues to smoke.  No Known Allergies  Current Outpatient Prescriptions  Medication Sig Dispense Refill  . aspirin 81 MG tablet Take 81 mg by mouth daily.      Marland Kitchen lisinopril (PRINIVIL,ZESTRIL) 20 MG tablet TAKE ONE TABLET BY MOUTH EVERY DAY  90 tablet  0  . metoprolol (LOPRESSOR) 100 MG tablet Take 0.5 tablets (50 mg total) by mouth 2 (two) times daily.  30 tablet  11  . simvastatin (ZOCOR) 80 MG tablet TAKE ONE TABLET BY MOUTH NIGHTLY AT BEDTIME  30 tablet  4   No current facility-administered medications for this visit.    Past Medical History  Diagnosis Date  . ASCVD (arteriosclerotic cardiovascular disease) 02/2006    coronary artery bypass graft surgery  . Tobacco abuse     discontinued in 03/2006 ;50 pack year consumption resumed  . Chronic bronchitis   . Coarctation of aorta   . Cervical cancer   . Hyperlipidemia     Past Surgical History  Procedure Laterality Date  . Left breast biopsy      for benign disease  . Cholecystectomy    . Coronary artery bypass graft  02/2006  . Cervical carcinoma      ROS: Review of systems complete and found to be negative unless listed above  PHYSICAL EXAM BP 191/81  Pulse 87  Ht 5\' 4"  (1.626 m)  Wt 200 lb  (90.719 kg)  BMI 34.31 kg/m2  General: Well developed, well nourished, in no acute distress Head: Eyes PERRLA, No xanthomas.   Normal cephalic and atramatic  Lungs: Clear bilaterally to auscultation with inspiratory wheezes. Heart: HRRR S1 S2, 2/6 systolic murmur.  Pulses are 2+ & equal.           Bilateral  carotid bruits. No JVD.  No abdominal bruits. No femoral bruits. Abdomen: Bowel sounds are positive, abdomen soft and non-tender without masses or                  Hernia's noted. Msk:  Back normal, normal gait. Normal strength and tone for age. Extremities: No clubbing, cyanosis or edema.  DP +1 Neuro: Alert and oriented X 3. Psych:  Good affect, responds appropriately  EKG: NSR rate of 84 bpm. Anterior lateral abnormalities.  ASSESSMENT AND PLAN

## 2012-12-10 ENCOUNTER — Telehealth: Payer: Self-pay | Admitting: Adult Health

## 2012-12-10 MED ORDER — METOPROLOL TARTRATE 100 MG PO TABS: 50.0000 mg | ORAL_TABLET | Freq: Two times a day (BID) | ORAL | Status: DC

## 2012-12-10 MED ORDER — SIMVASTATIN 80 MG PO TABS: ORAL_TABLET | ORAL | Status: DC

## 2012-12-10 NOTE — Telephone Encounter (Signed)
Needs refills on Simvastatin and Metoprolol sent to [facility] in [place]

## 2012-12-15 ENCOUNTER — Ambulatory Visit (HOSPITAL_COMMUNITY)
Admission: RE | Admit: 2012-12-15 | Discharge: 2012-12-15 | Disposition: A | Discharge status: Home / Self Care | Payer: Medicaid Other | Source: Ambulatory Visit | Attending: Adult Health | Admitting: Adult Health

## 2012-12-15 DIAGNOSIS — R011 Cardiac murmur, unspecified: Secondary | ICD-10-CM | POA: Insufficient documentation

## 2012-12-15 DIAGNOSIS — I2510 Atherosclerotic heart disease of native coronary artery without angina pectoris: Secondary | ICD-10-CM | POA: Insufficient documentation

## 2012-12-15 DIAGNOSIS — F17200 Nicotine dependence, unspecified, uncomplicated: Secondary | ICD-10-CM | POA: Insufficient documentation

## 2012-12-15 DIAGNOSIS — I359 Nonrheumatic aortic valve disorder, unspecified: Secondary | ICD-10-CM

## 2012-12-15 DIAGNOSIS — I10 Essential (primary) hypertension: Secondary | ICD-10-CM

## 2012-12-15 NOTE — Progress Notes (Signed)
[facility]   2D echo completed 12/15/2012.   Cindy Sakib Noguez, RDCS  

## 2013-02-26 ENCOUNTER — Encounter: Payer: Self-pay | Admitting: Cardiology

## 2013-02-26 ENCOUNTER — Ambulatory Visit (INDEPENDENT_AMBULATORY_CARE_PROVIDER_SITE_OTHER): Payer: Medicaid Other | Admitting: Cardiology

## 2013-02-26 VITALS — BP 166/62 | HR 62 | Ht 62.0 in | Wt 210.0 lb

## 2013-02-26 DIAGNOSIS — I2510 Atherosclerotic heart disease of native coronary artery without angina pectoris: Secondary | ICD-10-CM

## 2013-02-26 DIAGNOSIS — R0602 Shortness of breath: Secondary | ICD-10-CM

## 2013-02-26 DIAGNOSIS — I10 Essential (primary) hypertension: Secondary | ICD-10-CM

## 2013-02-26 DIAGNOSIS — E785 Hyperlipidemia, unspecified: Secondary | ICD-10-CM

## 2013-02-26 NOTE — Patient Instructions (Signed)
Your physician wants you to follow-up in: 6 MONTHS You will receive a reminder letter in the mail two months in advance. If you don't receive a letter, please call our office to schedule the follow-up appointment.  Your physician recommends that you return for lab work in: THIS WEEK (SLIPS GIVEN FOR CBC,BMET,LIPID)  Your physician has recommended that you have a pulmonary function test. Pulmonary Function Tests are a group of tests that measure how well air moves in and out of your lungs.  WE WILL CALL YOU WITH YOUR TEST RESULTS/INSTRUCTIONS/NEXT STEPS ONCE RECEIVED BY THE PROVIDER   Your physician recommends THAT YOU ESTABLISH CARE WITH A PCP IN THE AREA, REVIEW THE LIST GIVEN TODAY

## 2013-02-26 NOTE — Progress Notes (Signed)
Clinical Summary Ms. Heinrichs is a 57 y.o.female last seen by NP Lyman Bishop, this is our fist visit together. She was seen for the following medical problems  1. CAD - prior CABG in 2007 3 vessel (LIMA to LAD, SVG to OM, SVG to PDA) - Echo 12/2012 LVEF 60-65% - denies and chest pain. Reports some occasonal SOB and DOE which is stable - compliant with meds: ASA, lisinopril, metoprol, simvastatin  2. Hyperlipidemia - compliant with statin - no recent panel  3. HTN - has had issues with adherence due to financial issues - does not check regularly  4. Heart murmur - echo ordered last visit - mild AS on recent echo - denies any significant symptoms  5. Coarctation aorta - asymptomatic, decision was made at time of CABG not to repair.  - denies any signicant upper extremity symptoms  6. Tobacco - 40 years, 1 ppd - reports she is not ready to quit.   Past Medical History  Diagnosis Date  . ASCVD (arteriosclerotic cardiovascular disease) 02/2006    coronary artery bypass graft surgery  . Tobacco abuse     discontinued in 03/2006 ;50 pack year consumption resumed  . Chronic bronchitis   . Coarctation of aorta   . Cervical cancer   . Hyperlipidemia      No Known Allergies   Current Outpatient Prescriptions  Medication Sig Dispense Refill  . aspirin 81 MG tablet Take 81 mg by mouth daily.      Marland Kitchen lisinopril-hydrochlorothiazide (PRINZIDE,ZESTORETIC) 20-12.5 MG per tablet Take 1 tablet by mouth daily.  30 tablet  6  . metoprolol (LOPRESSOR) 100 MG tablet Take 0.5 tablets (50 mg total) by mouth 2 (two) times daily.  30 tablet  11  . simvastatin (ZOCOR) 80 MG tablet TAKE ONE TABLET BY MOUTH NIGHTLY AT BEDTIME  30 tablet  6   No current facility-administered medications for this visit.     Past Surgical History  Procedure Laterality Date  . Left breast biopsy      for benign disease  . Cholecystectomy    . Coronary artery bypass graft  02/2006  . Cervical carcinoma        No Known Allergies    No family history on file.   Social History Ms. Montas reports that she has been smoking Cigarettes.  She has a 50 pack-year smoking history. She has never used smokeless tobacco. Ms. Beilfuss reports that she does not drink alcohol.   Review of Systems CONSTITUTIONAL: No weight loss, fever, chills, weakness or fatigue.  HEENT: Eyes: No visual loss, blurred vision, double vision or yellow sclerae.No hearing loss, sneezing, congestion, runny nose or sore throat.  SKIN: No rash or itching.  CARDIOVASCULAR: per HPI RESPIRATORY: No shortness of breath, cough or sputum.  GASTROINTESTINAL: No anorexia, nausea, vomiting or diarrhea. No abdominal pain or blood.  GENITOURINARY: No burning on urination, no polyuria NEUROLOGICAL: No headache, dizziness, syncope, paralysis, ataxia, numbness or tingling in the extremities. No change in bowel or bladder control.  MUSCULOSKELETAL: No muscle, back pain, joint pain or stiffness.  LYMPHATICS: No enlarged nodes. No history of splenectomy.  PSYCHIATRIC: No history of depression or anxiety.  ENDOCRINOLOGIC: No reports of sweating, cold or heat intolerance. No polyuria or polydipsia.  Marland Kitchen   Physical Examination p 140/70 p 62 Wt 210 lbs BMI 38 Gen: resting comfortably, no acute distress HEENT: no scleral icterus, pupils equal round and reactive, no palptable cervical adenopathy,  CV: RRR, 2/6 systolic murmur  RUSB early peaking, bilateral carotid bruits, no JVD Resp: Clear to auscultation bilaterally GI: abdomen is soft, non-tender, non-distended, normal bowel sounds, no hepatosplenomegaly MSK: extremities are warm, no edema.  Skin: warm, no rash Neuro:  no focal deficits Psych: appropriate affect   Diagnostic Studies 12/2012 Echo LVEF 60-65%, mild to mod LVH, normal diastolic function, mild AS (valve area 1.5 VTI, mean grad 10), RV function mildly reduced  07/2011 Carotid US 1. Bilateral carotid bifurcation and  proximal ICA plaque, resulting in less than 50% diameter stenosis. The exam does not exclude plaque ulceration or embolization. Continued surveillance recommended.  02/2006 Cath FINDINGS:  1. LV: 143/17/26. EF 65% without regional wall motion abnormality.  2. No aortic stenosis or mitral regurgitation.  3. Left main: There is an 80% stenosis of the mid vessel.  4. LAD: Moderate-size vessel giving rise to two small proximal  diagonals and a larger third diagonal. The LAD has heavy  calcification proximally and diffuse mild disease. The third  diagonal has a 70% stenosis proximally. A small medial subdivision  of this has a 99% stenosis.  5. Circumflex: Moderate-sized vessel giving rise to a single  marginal. There is a 70% stenosis in the AV groove portion of the  vessel.  6. RCA: Moderate-size, dominant vessel. There is an 80% ostial  stenosis, a 40% stenosis of the mid vessel, and a 90% stenosis just  before the origin of the PDA.  7. Left subclavian artery: Diffuse 30% stenosis proximally. The LIMA  is widely patent.  8. Aortic coarctation with 30 mmHg translesional gradient.  9. Normal great vessel anatomy. There is mild ostial stenosis of the  left common carotid artery.  IMPRESSION/RECOMMENDATIONS:  1. Severe multivessel coronary disease.  2. Coarctation of aorta.  3. Normal left ventricular systolic function.  Will refer the to cardiac surgery for consideration of coronary artery  bypass grafting and repair of her coarctation.     Assessment and Plan  1. CAD - no current symptoms - continue secondary prevention and risk factor modification  2. Hyperlipidemia - continue current statin - repeat lipid panel  3. Hypertension - at goal, continue current meds  4. Mild aortic stenosis - no current symptoms - continue to follow clinically  5. Tobacco - precontemplative, encouraged her to quit, explained the health risks of continued tobacco use. - stable dyspnea  with no clear cardiac source, has never had PFTs. Will order.      Antoine Poche, M.D., F.A.C.C.

## 2013-03-01 LAB — CBC
HCT: 39.4 % (ref 36.0–46.0)
MCHC: 34 g/dL (ref 30.0–36.0)
MCV: 88.9 fL (ref 78.0–100.0)
Platelets: 153 10*3/uL (ref 150–400)
RDW: 15.2 % (ref 11.5–15.5)

## 2013-03-01 LAB — LIPID PANEL
Cholesterol: 259 mg/dL — ABNORMAL HIGH (ref 0–200)
LDL Cholesterol: 180 mg/dL — ABNORMAL HIGH (ref 0–99)
Total CHOL/HDL Ratio: 5.5 Ratio
Triglycerides: 162 mg/dL — ABNORMAL HIGH (ref <150)

## 2013-03-01 LAB — BASIC METABOLIC PANEL
BUN: 14 mg/dL (ref 6–23)
Creat: 0.89 mg/dL (ref 0.50–1.10)
Glucose, Bld: 111 mg/dL — ABNORMAL HIGH (ref 70–99)

## 2013-03-05 ENCOUNTER — Encounter: Payer: Self-pay | Admitting: *Deleted

## 2013-03-08 ENCOUNTER — Encounter (HOSPITAL_COMMUNITY): Payer: Medicaid Other

## 2013-03-17 ENCOUNTER — Ambulatory Visit (HOSPITAL_COMMUNITY)
Admission: RE | Admit: 2013-03-17 | Discharge: 2013-03-17 | Disposition: A | Discharge status: Home / Self Care | Payer: Medicaid Other | Source: Ambulatory Visit | Attending: Cardiology | Admitting: Cardiology

## 2013-03-17 DIAGNOSIS — Z87891 Personal history of nicotine dependence: Secondary | ICD-10-CM | POA: Insufficient documentation

## 2013-03-17 DIAGNOSIS — R0902 Hypoxemia: Secondary | ICD-10-CM | POA: Insufficient documentation

## 2013-03-17 DIAGNOSIS — I10 Essential (primary) hypertension: Secondary | ICD-10-CM

## 2013-03-17 DIAGNOSIS — R0602 Shortness of breath: Secondary | ICD-10-CM | POA: Insufficient documentation

## 2013-03-17 LAB — BLOOD GAS, ARTERIAL
Acid-Base Excess: 2.5 mmol/L — ABNORMAL HIGH (ref 0.0–2.0)
Bicarbonate: 26.2 mEq/L — ABNORMAL HIGH (ref 20.0–24.0)
FIO2: 0.21 %
O2 Saturation: 93.8 %
PCO2 ART: 38.7 mmHg (ref 35.0–45.0)
PO2 ART: 62.6 mmHg — AB (ref 80.0–100.0)
Patient temperature: 37
TCO2: 23.2 mmol/L (ref 0–100)
pH, Arterial: 7.447 (ref 7.350–7.450)

## 2013-03-17 MED ORDER — ALBUTEROL SULFATE (2.5 MG/3ML) 0.083% IN NEBU
2.5000 mg | INHALATION_SOLUTION | Freq: Once | RESPIRATORY_TRACT | Status: AC
  Administered 2013-03-17: 2.5 mg via RESPIRATORY_TRACT

## 2013-03-19 NOTE — Procedures (Signed)
NAME:  Christina Boyle, Christina Boyle                  ACCOUNT NO.:  MEDICAL RECORD NO.:  [id number]  LOCATION:                                 FACILITY:  PHYSICIAN:  Dael Howland L. Luan Pulling, M.D.DATE OF BIRTH:  Apr 04, 1955  DATE OF PROCEDURE: DATE OF DISCHARGE:                           PULMONARY FUNCTION TEST   Reason for pulmonary function testing is shortness of breath. 1. Spirometry shows a moderate ventilatory defect with minimal airflow     obstruction. 2. Lung volumes show air trapping. 3. DLCO is normal. 4. Airway resistance is high confirming the presence of airflow     obstruction. 5. No significant bronchodilator improvement. 6. Arterial blood gas shows relative resting hypoxia. 7. This study is consistent with COPD considering the patient's     smoking history.     Graviela Nodal L. Luan Pulling, M.D.     ELH/MEDQ  D:  03/17/2013  T:  03/18/2013  Job:  [id number]  cc:   Dr. Carlyle Dolly [facility] [place]

## 2013-04-13 ENCOUNTER — Telehealth: Payer: Self-pay | Admitting: *Deleted

## 2013-04-13 MED ORDER — ROSUVASTATIN CALCIUM 20 MG PO TABS: 20.0000 mg | ORAL_TABLET | Freq: Every day | ORAL | Status: DC

## 2013-04-13 NOTE — Telephone Encounter (Addendum)
Pt also requested the results of her PFT's that we ordered and were performed on 03-17-13, advised pt we are currently experiencing interpretation issues with the PFT's apologized to the pt for the inconvience, and that my manager YL is aware and currently working on the concern, pt understood and aware we will call her with the results asap.

## 2013-04-13 NOTE — Telephone Encounter (Signed)
Pt walked in to advise she needs more samples of crestor 20mg , added medication to pt chart per switched from zocor 80mg , samples signed out and given to pt

## 2013-04-13 NOTE — Addendum Note (Signed)
Addended by: Shara Blazing A on: 04/13/2013 11:13 AM   Modules accepted: Orders

## 2013-06-08 LAB — PULMONARY FUNCTION TEST
DL/VA % PRED: 134 %
DL/VA: 6.1 ml/min/mmHg/L
DLCO cor % pred: 88 %
DLCO cor: 19.06 ml/min/mmHg
DLCO unc % pred: 87 %
DLCO unc: 18.76 ml/min/mmHg
FEF 25-75 Post: 1.72 L/sec
FEF 25-75 Pre: 1.73 L/sec
FEF2575-%CHANGE-POST: 0 %
FEF2575-%PRED-POST: 76 %
FEF2575-%Pred-Pre: 77 %
FEV1-%CHANGE-POST: 1 %
FEV1-%Pred-Post: 66 %
FEV1-%Pred-Pre: 65 %
FEV1-POST: 1.58 L
FEV1-Pre: 1.55 L
FEV1FVC-%Change-Post: -2 %
FEV1FVC-%PRED-PRE: 107 %
FEV6-%Change-Post: 4 %
FEV6-%Pred-Post: 65 %
FEV6-%Pred-Pre: 62 %
FEV6-Post: 1.92 L
FEV6-Pre: 1.85 L
FEV6FVC-%Pred-Post: 104 %
FEV6FVC-%Pred-Pre: 104 %
FVC-%CHANGE-POST: 4 %
FVC-%Pred-Post: 62 %
FVC-%Pred-Pre: 60 %
FVC-PRE: 1.85 L
FVC-Post: 1.92 L
PRE FEV1/FVC RATIO: 84 %
PRE FEV6/FVC RATIO: 100 %
Post FEV1/FVC ratio: 82 %
Post FEV6/FVC ratio: 100 %
RV % PRED: 160 %
RV: 3.02 L
TLC % pred: 100 %
TLC: 4.79 L

## 2013-08-31 ENCOUNTER — Other Ambulatory Visit: Payer: Self-pay | Admitting: Adult Health

## 2013-10-07 ENCOUNTER — Encounter: Payer: Self-pay | Admitting: Cardiology

## 2013-10-07 ENCOUNTER — Ambulatory Visit (INDEPENDENT_AMBULATORY_CARE_PROVIDER_SITE_OTHER): Payer: Medicaid Other | Admitting: Cardiology

## 2013-10-07 VITALS — BP 148/80 | HR 74 | Ht 62.0 in | Wt 203.0 lb

## 2013-10-07 DIAGNOSIS — R0989 Other specified symptoms and signs involving the circulatory and respiratory systems: Secondary | ICD-10-CM

## 2013-10-07 DIAGNOSIS — I2510 Atherosclerotic heart disease of native coronary artery without angina pectoris: Secondary | ICD-10-CM

## 2013-10-07 DIAGNOSIS — I359 Nonrheumatic aortic valve disorder, unspecified: Secondary | ICD-10-CM

## 2013-10-07 DIAGNOSIS — I350 Nonrheumatic aortic (valve) stenosis: Secondary | ICD-10-CM

## 2013-10-07 DIAGNOSIS — I10 Essential (primary) hypertension: Secondary | ICD-10-CM

## 2013-10-07 DIAGNOSIS — E785 Hyperlipidemia, unspecified: Secondary | ICD-10-CM

## 2013-10-07 DIAGNOSIS — J449 Chronic obstructive pulmonary disease, unspecified: Secondary | ICD-10-CM

## 2013-10-07 MED ORDER — TIOTROPIUM BROMIDE MONOHYDRATE 18 MCG IN CAPS: 18.0000 ug | ORAL_CAPSULE | Freq: Every day | RESPIRATORY_TRACT | Status: DC

## 2013-10-07 MED ORDER — LISINOPRIL-HYDROCHLOROTHIAZIDE 20-12.5 MG PO TABS: 1.0000 | ORAL_TABLET | Freq: Every day | ORAL | Status: DC

## 2013-10-07 NOTE — Patient Instructions (Addendum)
Your physician wants you to follow-up in: 6 months You will receive a reminder letter in the mail two months in advance. If you don't receive a letter, please call our office to schedule the follow-up appointment.   Your physician has recommended you make the following change in your medication:    Start Spiriva handihaler as directed   You have been referred to Dr Luan Pulling pulmonary to establish as your pcp   Please get blood work (CMET,Lipid) fasting  Your physician has requested that you have a carotid duplex. This test is an ultrasound of the carotid arteries in your neck. It looks at blood flow through these arteries that supply the brain with blood. Allow one hour for this exam. There are no restrictions or special instructions.        Thank you for choosing [facility] !

## 2013-10-07 NOTE — Progress Notes (Signed)
Clinical Summary Christina Boyle is a 58 y.o.female seen today for follow up of the following medical problems.   1. CAD  - prior CABG in 2007 3 vessel (LIMA to LAD, SVG to OM, SVG to PDA)  - Echo 12/2012 LVEF 60-65%   - denies any chest pain. Reports some occasonal SOB and DOE which is stable  - compliant with meds: ASA, lisinopril, metoprol, crestor  2. Hyperlipidemia  - last visit changed to crestro 20mg  daily, denies any new side efects  3. HTN  - compliant with meds, though ran out of lisinopril/hctz combo - does not check bp regularly   4. Heart murmur  - echo ordered last visit  - mild AS on recent echo  - denies any significant symptoms   5. Coarctation aorta  - asymptomatic, decision was made at time of CABG not to repair.  - denies any signicant upper extremity symptoms   6. Tobacco/COPD  - 40 years, 1 ppd  - reports she is not ready to quit. - PFTs 03/2013 with evidence of COPD   Past Medical History  Diagnosis Date  . ASCVD (arteriosclerotic cardiovascular disease) 02/2006    coronary artery bypass graft surgery  . Tobacco abuse     discontinued in 03/2006 ;50 pack year consumption resumed  . Chronic bronchitis   . Coarctation of aorta   . Cervical cancer   . Hyperlipidemia      No Known Allergies   Current Outpatient Prescriptions  Medication Sig Dispense Refill  . aspirin 81 MG tablet Take 81 mg by mouth daily.      Marland Kitchen lisinopril-hydrochlorothiazide (PRINZIDE,ZESTORETIC) 20-12.5 MG per tablet TAKE ONE TABLET BY MOUTH ONCE DAILY  30 tablet  0  . metoprolol (LOPRESSOR) 100 MG tablet Take 0.5 tablets (50 mg total) by mouth 2 (two) times daily.  30 tablet  11  . rosuvastatin (CRESTOR) 20 MG tablet Take 1 tablet (20 mg total) by mouth daily.  30 tablet  6   No current facility-administered medications for this visit.     Past Surgical History  Procedure Laterality Date  . Left breast biopsy      for benign disease  . Cholecystectomy    .  Coronary artery bypass graft  02/2006  . Cervical carcinoma       No Known Allergies    No family history on file.   Social History Ms. Dockery reports that she has been smoking Cigarettes.  She has a 50 pack-year smoking history. She has never used smokeless tobacco. Ms. Savarino reports that she does not drink alcohol.   Review of Systems CONSTITUTIONAL: No weight loss, fever, chills, weakness or fatigue.  HEENT: Eyes: No visual loss, blurred vision, double vision or yellow sclerae.No hearing loss, sneezing, congestion, runny nose or sore throat.  SKIN: No rash or itching.  CARDIOVASCULAR: per HPI RESPIRATORY: chronic SOB GASTROINTESTINAL: No anorexia, nausea, vomiting or diarrhea. No abdominal pain or blood.  GENITOURINARY: No burning on urination, no polyuria NEUROLOGICAL: No headache, dizziness, syncope, paralysis, ataxia, numbness or tingling in the extremities. No change in bowel or bladder control.  MUSCULOSKELETAL: No muscle, back pain, joint pain or stiffness.  LYMPHATICS: No enlarged nodes. No history of splenectomy.  PSYCHIATRIC: No history of depression or anxiety.  ENDOCRINOLOGIC: No reports of sweating, cold or heat intolerance. No polyuria or polydipsia.  Marland Kitchen   Physical Examination p 74 bp 148/80 Wt 203 lbs BMI 37 Gen: resting comfortably, no acute distress HEENT: no  scleral icterus, pupils equal round and reactive, no palptable cervical adenopathy,  CV: RRR, 3/6 systolic murmur RUSB, no JVD. + bilateral carotid bruits Resp: Clear to auscultation bilaterally GI: abdomen is soft, non-tender, non-distended, normal bowel sounds, no hepatosplenomegaly MSK: extremities are warm, no edema.  Skin: warm, no rash Neuro:  no focal deficits Psych: appropriate affect   Diagnostic Studies 12/2012 Echo  LVEF 60-65%, mild to mod LVH, normal diastolic function, mild AS (valve area 1.5 VTI, mean grad 10), RV function mildly reduced   07/2011 Carotid US  1. Bilateral  carotid bifurcation and proximal ICA plaque, resulting in less than 50% diameter stenosis. The exam does not exclude plaque ulceration or embolization. Continued surveillance recommended.  02/2006 Cath  FINDINGS:  1. LV: 143/17/26. EF 65% without regional wall motion abnormality.  2. No aortic stenosis or mitral regurgitation.  3. Left main: There is an 80% stenosis of the mid vessel.  4. LAD: Moderate-size vessel giving rise to two small proximal  diagonals and a larger third diagonal. The LAD has heavy  calcification proximally and diffuse mild disease. The third  diagonal has a 70% stenosis proximally. A small medial subdivision  of this has a 99% stenosis.  5. Circumflex: Moderate-sized vessel giving rise to a single  marginal. There is a 70% stenosis in the AV groove portion of the  vessel.  6. RCA: Moderate-size, dominant vessel. There is an 80% ostial  stenosis, a 40% stenosis of the mid vessel, and a 90% stenosis just  before the origin of the PDA.  7. Left subclavian artery: Diffuse 30% stenosis proximally. The LIMA  is widely patent.  8. Aortic coarctation with 30 mmHg translesional gradient.  9. Normal great vessel anatomy. There is mild ostial stenosis of the  left common carotid artery.  IMPRESSION/RECOMMENDATIONS:  1. Severe multivessel coronary disease.  2. Coarctation of aorta.  3. Normal left ventricular systolic function.  Will refer the to cardiac surgery for consideration of coronary artery  bypass grafting and repair of her coarctation.    Jan 2015 PFTs Reason for pulmonary function testing is shortness of breath.  1. Spirometry shows a moderate ventilatory defect with minimal airflow  obstruction.  2. Lung volumes show air trapping.  3. DLCO is normal.  4. Airway resistance is high confirming the presence of airflow  obstruction.  5. No significant bronchodilator improvement.  6. Arterial blood gas shows relative resting hypoxia.  7. This study is  consistent with COPD considering the patient's  smoking history.     Assessment and Plan   1. CAD  - no current symptoms  - continue secondary prevention and risk factor modification   2. Hyperlipidemia  - continue current statin  - repeat lipid panel   3. Hypertension  - elevated, however has not taken all meds today - continue to follow  4. Mild aortic stenosis  - no current symptoms  - continue to follow clinically   5. Tobacco  - precontemplative, encouraged her to quit, explained the health risks of continued tobacco use.   6. COPD - new diagnosis. Start spirivia, refer to Dr Luan Pulling as pcp and for COPD treatment.  7. Carotid bruits - repeat carotid US   F/u 6 months  [name], M.D., F.A.C.C.

## 2013-10-11 LAB — COMPREHENSIVE METABOLIC PANEL
ALK PHOS: 65 U/L (ref 39–117)
ALT: 28 U/L (ref 0–35)
AST: 23 U/L (ref 0–37)
Albumin: 4.3 g/dL (ref 3.5–5.2)
BILIRUBIN TOTAL: 0.3 mg/dL (ref 0.2–1.2)
BUN: 8 mg/dL (ref 6–23)
CO2: 27 mEq/L (ref 19–32)
Calcium: 9.4 mg/dL (ref 8.4–10.5)
Chloride: 104 mEq/L (ref 96–112)
Creat: 0.81 mg/dL (ref 0.50–1.10)
Glucose, Bld: 109 mg/dL — ABNORMAL HIGH (ref 70–99)
Potassium: 4.4 mEq/L (ref 3.5–5.3)
SODIUM: 139 meq/L (ref 135–145)
TOTAL PROTEIN: 6.6 g/dL (ref 6.0–8.3)

## 2013-10-11 LAB — LIPID PANEL
Cholesterol: 232 mg/dL — ABNORMAL HIGH (ref 0–200)
HDL: 49 mg/dL (ref >39)
LDL CALC: 150 mg/dL — AB (ref 0–99)
Total CHOL/HDL Ratio: 4.7 Ratio
Triglycerides: 164 mg/dL — ABNORMAL HIGH (ref <150)
VLDL: 33 mg/dL (ref 0–40)

## 2013-10-12 ENCOUNTER — Ambulatory Visit (HOSPITAL_COMMUNITY)
Admission: RE | Admit: 2013-10-12 | Discharge: 2013-10-12 | Disposition: A | Discharge status: Home / Self Care | Payer: Medicaid Other | Source: Ambulatory Visit | Attending: Cardiology | Admitting: Cardiology

## 2013-10-12 DIAGNOSIS — I6529 Occlusion and stenosis of unspecified carotid artery: Secondary | ICD-10-CM | POA: Diagnosis not present

## 2013-10-12 DIAGNOSIS — R0989 Other specified symptoms and signs involving the circulatory and respiratory systems: Secondary | ICD-10-CM | POA: Diagnosis present

## 2013-10-12 DIAGNOSIS — I658 Occlusion and stenosis of other precerebral arteries: Secondary | ICD-10-CM | POA: Insufficient documentation

## 2013-10-22 ENCOUNTER — Telehealth: Payer: Self-pay | Admitting: Cardiovascular Disease

## 2013-10-22 ENCOUNTER — Telehealth: Payer: Self-pay

## 2013-10-22 MED ORDER — ROSUVASTATIN CALCIUM 40 MG PO TABS: 40.0000 mg | ORAL_TABLET | Freq: Every day | ORAL | Status: DC

## 2013-10-22 NOTE — Telephone Encounter (Signed)
Message copied by Bernita Raisin on Fri Oct 22, 2013  9:58 AM ------      Message from: Mansfield F      Created: Fri Oct 22, 2013  9:38 AM       Not as much change in cholsterol as we would expect after changing her to crestor a few months ago. Please confirm she is taking crestor daily, and if so increase to crestor 40mg  daily                  Zandra Abts MD ------

## 2013-10-22 NOTE — Telephone Encounter (Signed)
Pt is compliant with Crestor,dose increased to 40 mg daily,escribed to pharmacy

## 2013-10-22 NOTE — Telephone Encounter (Signed)
Please see refill bin / tgs  °

## 2013-11-04 ENCOUNTER — Telehealth: Payer: Self-pay | Admitting: Cardiovascular Disease

## 2013-11-04 MED ORDER — ROSUVASTATIN CALCIUM 40 MG PO TABS: 40.0000 mg | ORAL_TABLET | Freq: Every day | ORAL | Status: DC

## 2013-11-04 NOTE — Telephone Encounter (Signed)
Refill request complete 

## 2013-11-04 NOTE — Telephone Encounter (Signed)
Please send in RX for Crestor 40 mg to [facility] in [place]

## 2013-11-08 ENCOUNTER — Telehealth: Payer: Self-pay | Admitting: Cardiology

## 2013-11-08 ENCOUNTER — Telehealth: Payer: Self-pay | Admitting: Cardiovascular Disease

## 2013-11-08 NOTE — Telephone Encounter (Signed)
Needs prior authorization.  Please see refill bin / tgs

## 2013-11-08 NOTE — Telephone Encounter (Signed)
Received fax refill request  Rx # [id number] Medication:  Crestor 40 mg tab Qty 30 Sig:  Take one tablet by mouth once daily Physician:  Harl Bowie

## 2013-11-08 NOTE — Telephone Encounter (Signed)
Refill was done last week on 8/27 confirmed by pharmacy

## 2013-11-11 ENCOUNTER — Telehealth: Payer: Self-pay | Admitting: *Deleted

## 2013-11-11 NOTE — Telephone Encounter (Signed)
[place] med assist PA# [phone] instructed to call again 9/4 for decision. Notified pt

## 2013-11-19 ENCOUNTER — Telehealth: Payer: Self-pay | Admitting: *Deleted

## 2013-11-19 MED ORDER — ROSUVASTATIN CALCIUM 20 MG PO TABS: 40.0000 mg | ORAL_TABLET | Freq: Every day | ORAL | Status: DC

## 2013-11-19 NOTE — Telephone Encounter (Signed)
Will try to escribe 20 mg take 2 tabs daily for crestor and see if insurance will cover

## 2013-11-19 NOTE — Telephone Encounter (Signed)
To Dr Koneswaran 

## 2013-11-19 NOTE — Telephone Encounter (Signed)
She needs crestor 40mg  daily, was not at goal on 20mg  daily. Can write as needed to get her the Rx  Zandra Abts MD

## 2013-11-19 NOTE — Telephone Encounter (Signed)
Will forward to dr branch

## 2013-11-19 NOTE — Telephone Encounter (Signed)
This is Dr. Branch's patient. 

## 2013-11-19 NOTE — Telephone Encounter (Signed)
Pt insurance will not cover her to have crestor 40 mg only 20mg  can we call in enough for her to take two at a time? Or switch her over to something else?

## 2013-11-22 ENCOUNTER — Telehealth: Payer: Self-pay | Admitting: Cardiovascular Disease

## 2013-11-22 NOTE — Telephone Encounter (Signed)
Faxed back form with PA# to both [facility] and [facility] as instructed

## 2013-11-22 NOTE — Telephone Encounter (Signed)
Please see refill bin / tgs  °

## 2013-11-29 ENCOUNTER — Telehealth: Payer: Self-pay | Admitting: Cardiology

## 2013-11-29 MED ORDER — ATORVASTATIN CALCIUM 80 MG PO TABS
80.0000 mg | ORAL_TABLET | Freq: Every day | ORAL | Status: DC
Start: 2013-11-29 — End: 2014-01-27

## 2013-11-29 NOTE — Telephone Encounter (Signed)
Received fax refill request  Rx # [id number] Medication:  Crestor 20 mg tab Qty 60 Sig:  Take two tablets by mouth once daily Physician:  Branch  Pt is still waiting on a prior authorization / thanks

## 2013-11-29 NOTE — Telephone Encounter (Signed)
We can start her on lipitor 80mg  dail   Zandra Abts MD

## 2013-11-29 NOTE — Telephone Encounter (Signed)
Pt states needs to switch to something other than Crestor as insurance will not approve. Will forward to Dr. Harl Bowie

## 2013-11-29 NOTE — Telephone Encounter (Signed)
Left message with pt. D/c'd Crestor, sent Lipitor 80 [facility] to [facility]

## 2014-01-14 ENCOUNTER — Other Ambulatory Visit: Payer: Self-pay | Admitting: Adult Health

## 2014-01-20 ENCOUNTER — Encounter: Payer: Self-pay | Admitting: *Deleted

## 2014-01-20 ENCOUNTER — Encounter: Payer: Self-pay | Admitting: Cardiology

## 2014-01-20 ENCOUNTER — Telehealth: Payer: Self-pay | Admitting: Cardiology

## 2014-01-20 ENCOUNTER — Ambulatory Visit (INDEPENDENT_AMBULATORY_CARE_PROVIDER_SITE_OTHER): Payer: Medicaid Other | Admitting: Cardiology

## 2014-01-20 VITALS — BP 163/78 | HR 66 | Ht 62.0 in | Wt 207.0 lb

## 2014-01-20 DIAGNOSIS — I10 Essential (primary) hypertension: Secondary | ICD-10-CM

## 2014-01-20 DIAGNOSIS — E785 Hyperlipidemia, unspecified: Secondary | ICD-10-CM

## 2014-01-20 DIAGNOSIS — R079 Chest pain, unspecified: Secondary | ICD-10-CM

## 2014-01-20 MED ORDER — NITROGLYCERIN 0.4 MG SL SUBL: 0.4000 mg | SUBLINGUAL_TABLET | SUBLINGUAL | Status: DC | PRN

## 2014-01-20 NOTE — Progress Notes (Signed)
Clinical Summary Christina Boyle is a 58 y.o.female seen today for follow up of the following medical problems.   1. CAD  - prior CABG in 2007 3 vessel (LIMA to LAD, SVG to OM, SVG to PDA)  - Echo 12/2012 LVEF 60-65%   -reports some episodes of chest pain. started approx 2 weeks ago, 4 total episodes. Sharp pain left chest, 7/10. Can occur at rest or with activity. No other associated symptoms. Pain lasts a few minutes. Not positional. No new DOE, no LE edema   2. Hyperlipidemia  - she was recently changed to atorvastatin 80mg  daily, her insurance would not cover crestor. She reports compliance  3. HTN  - compliant with meds, though has not taken yet today - does not check bp regularly      Past Medical History  Diagnosis Date  . ASCVD (arteriosclerotic cardiovascular disease) 02/2006    coronary artery bypass graft surgery  . Tobacco abuse     discontinued in 03/2006 ;50 pack year consumption resumed  . Chronic bronchitis   . Coarctation of aorta   . Cervical cancer   . Hyperlipidemia      No Known Allergies   Current Outpatient Prescriptions  Medication Sig Dispense Refill  . aspirin 81 MG tablet Take 81 mg by mouth daily.    Marland Kitchen atorvastatin (LIPITOR) 80 MG tablet Take 1 tablet (80 mg total) by mouth daily. 90 tablet 3  . lisinopril-hydrochlorothiazide (PRINZIDE,ZESTORETIC) 20-12.5 MG per tablet Take 1 tablet by mouth daily. 30 tablet 6  . metoprolol (LOPRESSOR) 100 MG tablet TAKE ONE-HALF TABLET BY MOUTH TWICE DAILY 30 tablet 3  . tiotropium (SPIRIVA HANDIHALER) 18 MCG inhalation capsule Place 1 capsule (18 mcg total) into inhaler and inhale daily. 30 capsule 12   No current facility-administered medications for this visit.     Past Surgical History  Procedure Laterality Date  . Left breast biopsy      for benign disease  . Cholecystectomy    . Coronary artery bypass graft  02/2006  . Cervical carcinoma       No Known Allergies    No family  history on file.   Social History Christina Boyle reports that she has been smoking Cigarettes.  She has a 50 pack-year smoking history. She has never used smokeless tobacco. Christina Boyle reports that she does not drink alcohol.   Review of Systems CONSTITUTIONAL: No weight loss, fever, chills, weakness or fatigue.  HEENT: Eyes: No visual loss, blurred vision, double vision or yellow sclerae.No hearing loss, sneezing, congestion, runny nose or sore throat.  SKIN: No rash or itching.  CARDIOVASCULAR: per HPI RESPIRATORY: No shortness of breath, cough or sputum.  GASTROINTESTINAL: No anorexia, nausea, vomiting or diarrhea. No abdominal pain or blood.  GENITOURINARY: No burning on urination, no polyuria NEUROLOGICAL: No headache, dizziness, syncope, paralysis, ataxia, numbness or tingling in the extremities. No change in bowel or bladder control.  MUSCULOSKELETAL: No muscle, back pain, joint pain or stiffness.  LYMPHATICS: No enlarged nodes. No history of splenectomy.  PSYCHIATRIC: No history of depression or anxiety.  ENDOCRINOLOGIC: No reports of sweating, cold or heat intolerance. No polyuria or polydipsia.  Marland Kitchen   Physical Examination p 66 bp 163/78 Wt 207 lbs BMI 38 Gen: resting comfortably, no acute distress HEENT: no scleral icterus, pupils equal round and reactive, no palptable cervical adenopathy,  CV: RRR, 2/6 systolic murmur RUSB, + bilateral bruits Resp: Clear to auscultation bilaterally GI: abdomen is soft, non-tender, non-distended,  normal bowel sounds, no hepatosplenomegaly MSK: extremities are warm, no edema.  Skin: warm, no rash Neuro:  no focal deficits Psych: appropriate affect   Diagnostic Studies 12/2012 Echo  LVEF 60-65%, mild to mod LVH, normal diastolic function, mild AS (valve area 1.5 VTI, mean grad 10), RV function mildly reduced   07/2011 Carotid US  1. Bilateral carotid bifurcation and proximal ICA plaque, resulting in less than 50% diameter stenosis. The  exam does not exclude plaque ulceration or embolization. Continued surveillance recommended.  02/2006 Cath  FINDINGS:  1. LV: 143/17/26. EF 65% without regional wall motion abnormality.  2. No aortic stenosis or mitral regurgitation.  3. Left main: There is an 80% stenosis of the mid vessel.  4. LAD: Moderate-size vessel giving rise to two small proximal  diagonals and a larger third diagonal. The LAD has heavy  calcification proximally and diffuse mild disease. The third  diagonal has a 70% stenosis proximally. A small medial subdivision  of this has a 99% stenosis.  5. Circumflex: Moderate-sized vessel giving rise to a single  marginal. There is a 70% stenosis in the AV groove portion of the  vessel.  6. RCA: Moderate-size, dominant vessel. There is an 80% ostial  stenosis, a 40% stenosis of the mid vessel, and a 90% stenosis just  before the origin of the PDA.  7. Left subclavian artery: Diffuse 30% stenosis proximally. The LIMA  is widely patent.  8. Aortic coarctation with 30 mmHg translesional gradient.  9. Normal great vessel anatomy. There is mild ostial stenosis of the  left common carotid artery.  IMPRESSION/RECOMMENDATIONS:  1. Severe multivessel coronary disease.  2. Coarctation of aorta.  3. Normal left ventricular systolic function.  Will refer the to cardiac surgery for consideration of coronary artery  bypass grafting and repair of her coarctation.    Jan 2015 PFTs Reason for pulmonary function testing is shortness of breath.  1. Spirometry shows a moderate ventilatory defect with minimal airflow  obstruction.  2. Lung volumes show air trapping.  3. DLCO is normal.  4. Airway resistance is high confirming the presence of airflow  obstruction.  5. No significant bronchodilator improvement.  6. Arterial blood gas shows relative resting hypoxia.  7. This study is consistent with COPD considering the patient's  smoking  history.   10/2013  IMPRESSION: 1. Slight interval progression of left-sided heterogeneous atherosclerotic plaque which now results in an estimated 50- 69% diameter ICA stenosis. 2. Stable heterogeneous atherosclerotic plaque on the right resulting in less than 50% diameter ICA stenosis. 3. Vertebral arteries are patent with normal antegrade flow.  Assessment and Plan   1. CAD  - recent episodes of chest pain, will obtain exercise MPI to assess for ischemia - continue current meds, given Rx for prn SL NG.    2. Hyperlipidemia  - continue current statin  - repeat lipid panel   3. Hypertension  - elevated, however has not taken all meds today - continue to follow   F/u 3 months  [name], M.D.

## 2014-01-20 NOTE — Patient Instructions (Signed)
Your physician has requested that you have en exercise stress myoview. For further information please visit [email or web address]. Please follow instruction sheet, as given. Office will contact with results via phone or letter.   Nitroglycerin as needed for severe chest pain only - new sent to pharm Continue all other medications.   Follow up in  3 months

## 2014-01-20 NOTE — Telephone Encounter (Signed)
PA received for Crestor,pt is NOT on Crestor, too expensive for her.She is on Lipitor see phone note 11/29/13

## 2014-01-20 NOTE — Telephone Encounter (Signed)
Prior authorization / please see refill bin / tgs °

## 2014-01-21 ENCOUNTER — Telehealth: Payer: Self-pay | Admitting: *Deleted

## 2014-01-21 NOTE — Telephone Encounter (Signed)
Pt didn't get message that she was switched to lipitor because her insurance would not pay for crestor (at her request to change) She will pick up lipitor today

## 2014-01-21 NOTE — Telephone Encounter (Signed)
PT states that her RX for crestor is not being approved because we switched her to something else. She is not aware that her RX has changed

## 2014-01-24 ENCOUNTER — Encounter (HOSPITAL_COMMUNITY)
Admission: RE | Admit: 2014-01-24 | Discharge: 2014-01-24 | Disposition: A | Discharge status: Home / Self Care | Payer: Medicaid Other | Source: Ambulatory Visit | Attending: Cardiology | Admitting: Cardiology

## 2014-01-24 ENCOUNTER — Ambulatory Visit (HOSPITAL_COMMUNITY)
Admission: RE | Admit: 2014-01-24 | Discharge: 2014-01-24 | Disposition: A | Discharge status: Home / Self Care | Payer: Medicaid Other | Source: Ambulatory Visit | Attending: Cardiology | Admitting: Cardiology

## 2014-01-24 ENCOUNTER — Encounter (HOSPITAL_COMMUNITY): Payer: Self-pay

## 2014-01-24 DIAGNOSIS — I10 Essential (primary) hypertension: Secondary | ICD-10-CM | POA: Insufficient documentation

## 2014-01-24 DIAGNOSIS — I2510 Atherosclerotic heart disease of native coronary artery without angina pectoris: Secondary | ICD-10-CM | POA: Insufficient documentation

## 2014-01-24 DIAGNOSIS — R079 Chest pain, unspecified: Secondary | ICD-10-CM

## 2014-01-24 DIAGNOSIS — Z951 Presence of aortocoronary bypass graft: Secondary | ICD-10-CM | POA: Diagnosis not present

## 2014-01-24 MED ORDER — TECHNETIUM TC 99M SESTAMIBI - CARDIOLITE
10.0000 | Freq: Once | INTRAVENOUS | Status: AC | PRN
Start: 2014-01-24 — End: 2014-01-24
  Administered 2014-01-24: 08:00:00 10 via INTRAVENOUS

## 2014-01-24 MED ORDER — TECHNETIUM TC 99M SESTAMIBI GENERIC - CARDIOLITE
30.0000 | Freq: Once | INTRAVENOUS | Status: AC | PRN
  Administered 2014-01-24: 30 via INTRAVENOUS

## 2014-01-24 MED ORDER — SODIUM CHLORIDE 0.9 % IJ SOLN
INTRAMUSCULAR | Status: AC
  Administered 2014-01-24: 10 mL via INTRAVENOUS
  Filled 2014-01-24: qty 10

## 2014-01-24 MED ORDER — REGADENOSON 0.4 MG/5ML IV SOLN
0.4000 mg | Freq: Once | INTRAVENOUS | Status: AC | PRN
  Administered 2014-01-24: 0.4 mg via INTRAVENOUS

## 2014-01-24 MED ORDER — REGADENOSON 0.4 MG/5ML IV SOLN
INTRAVENOUS | Status: AC
  Administered 2014-01-24: 0.4 mg via INTRAVENOUS
  Filled 2014-01-24: qty 5

## 2014-01-24 MED ORDER — SODIUM CHLORIDE 0.9 % IJ SOLN
10.0000 mL | INTRAMUSCULAR | Status: DC | PRN
  Administered 2014-01-24: 10 mL via INTRAVENOUS
  Filled 2014-01-24: qty 10

## 2014-01-24 NOTE — Progress Notes (Signed)
Stress Lab Nurses Notes - [facility]  DEANE MELICK 01/24/2014 Reason for doing test: CAD and HTN Type of test: Test Changed uable to reach THR , Wille Glaser given Nurse performing test: Gerrit Halls, RN Nuclear Medicine Tech: Melburn Hake Echo Tech: Not Applicable MD performing test: S. McDowell/K.Purcell Nails NP Family MD: Luan Pulling Test explained and consent signed: Yes.   IV started: Saline lock flushed, No redness or edema and Saline lock started in radiology Symptoms: sob on Treadmill / flushed feeling with Lexiscan Treatment/Intervention: None Reason test stopped: protocol completed After recovery IV was: Discontinued via X-ray tech and No redness or edema Patient to return to [place]. Med at : 10:00 Patient discharged: Home Patient's Condition upon discharge was: stable Comments: During test Peak BP 193/47 & HR 105.  Recovery BP 171/55 & HR 82 .  Symptoms resolved in recovery. Geanie Cooley T

## 2014-01-27 ENCOUNTER — Telehealth: Payer: Self-pay | Admitting: *Deleted

## 2014-01-27 MED ORDER — ATORVASTATIN CALCIUM 80 MG PO TABS: 80.0000 mg | ORAL_TABLET | Freq: Every day | ORAL | Status: DC

## 2014-01-27 NOTE — Telephone Encounter (Signed)
Refill escribed

## 2014-01-27 NOTE — Telephone Encounter (Signed)
Pt states that [facility] in [place] does not  have a Rx for Lipitor for her and we need to call in new Rx for it

## 2014-02-01 ENCOUNTER — Telehealth: Payer: Self-pay | Admitting: *Deleted

## 2014-02-01 MED ORDER — ISOSORBIDE MONONITRATE ER 30 MG PO TB24: 30.0000 mg | ORAL_TABLET | Freq: Every day | ORAL | Status: DC

## 2014-02-01 NOTE — Telephone Encounter (Signed)
-----   Message from [name], MD sent at 01/25/2014  8:27 AM EST ----- Please let patient know that her stress test suggests there may be a new small blockage in the arteries of her heart. This may be associated with some of the symptoms she is having. The area affected is fairly small, overall it is what we call an intermediate risk study. Please see how her chest pain has been doing and send me and update. I'd like to start her on imdur 30mg  daily as well to help prevent chest pain. Pending on her symptoms and response to medicine we may need to consider a cath for her. Can she see me back in 2 weeks?   Zandra Abts MD

## 2014-02-01 NOTE — Telephone Encounter (Signed)
Notes Recorded by Laurine Blazer, LPN on 24/26/8341 at 5:45 PM Patient notified. New med sent to [facility]. Follow up scheduled for 02/22/14 wth Dr. Harl Bowie.  Also, stated that she had only had one episode that did not last very long.  Otherwise, doing okay.  Advised to call back with any concerns or if symptoms worsen.  Patient verbalized understanding.

## 2014-02-22 ENCOUNTER — Encounter: Payer: Self-pay | Admitting: Cardiology

## 2014-02-22 ENCOUNTER — Ambulatory Visit (INDEPENDENT_AMBULATORY_CARE_PROVIDER_SITE_OTHER): Payer: Medicaid Other | Admitting: Cardiology

## 2014-02-22 VITALS — BP 191/76 | HR 74 | Ht 62.0 in | Wt 210.0 lb

## 2014-02-22 DIAGNOSIS — I2510 Atherosclerotic heart disease of native coronary artery without angina pectoris: Secondary | ICD-10-CM

## 2014-02-22 DIAGNOSIS — R079 Chest pain, unspecified: Secondary | ICD-10-CM

## 2014-02-22 MED ORDER — METOPROLOL TARTRATE 50 MG PO TABS: 75.0000 mg | ORAL_TABLET | Freq: Two times a day (BID) | ORAL | Status: DC

## 2014-02-22 NOTE — Progress Notes (Signed)
Clinical Summary Christina Boyle is a 58 y.o.female seen today for follow up of the following medical problems. This is a focused visit on her history of CAD and recent chest pain.    1. CAD  - prior CABG in 2007 3 vessel (LIMA to LAD, SVG to OM, SVG to PDA)  - Echo 12/2012 LVEF 60-65%  - last visit reported some episodes of chest pain, she was referred for stress testing.  - exercise MPI: did not reach THR, exercise limited by SOB and chest pain. Converted to [employer]. Apical anterior and anterolateral ischemia, intermediate risk. LVEF 62% - started on imdur 30mg  daily with noted improvement in symptoms, but not resolution. - currently describes episodes approx once a week, 5/10 in severity. Last 1-2 minutes, typically occurs at rest. No relation to food.    Past Medical History  Diagnosis Date  . ASCVD (arteriosclerotic cardiovascular disease) 02/2006    coronary artery bypass graft surgery  . Tobacco abuse     discontinued in 03/2006 ;50 pack year consumption resumed  . Chronic bronchitis   . Coarctation of aorta   . Cervical cancer   . Hyperlipidemia   . Hypertension      No Known Allergies   Current Outpatient Prescriptions  Medication Sig Dispense Refill  . aspirin 81 MG tablet Take 81 mg by mouth daily.    Marland Kitchen atorvastatin (LIPITOR) 80 MG tablet Take 1 tablet (80 mg total) by mouth daily. 90 tablet 3  . isosorbide mononitrate (IMDUR) 30 MG 24 hr tablet Take 1 tablet (30 mg total) by mouth daily. 30 tablet 6  . lisinopril-hydrochlorothiazide (PRINZIDE,ZESTORETIC) 20-12.5 MG per tablet Take 1 tablet by mouth daily. 30 tablet 6  . metoprolol (LOPRESSOR) 100 MG tablet TAKE ONE-HALF TABLET BY MOUTH TWICE DAILY 30 tablet 3  . nitroGLYCERIN (NITROSTAT) 0.4 MG SL tablet Place 1 tablet (0.4 mg total) under the tongue every 5 (five) minutes as needed for chest pain. 25 tablet 3  . tiotropium (SPIRIVA HANDIHALER) 18 MCG inhalation capsule Place 1 capsule (18 mcg total) into  inhaler and inhale daily. 30 capsule 12   No current facility-administered medications for this visit.     Past Surgical History  Procedure Laterality Date  . Left breast biopsy      for benign disease  . Cholecystectomy    . Coronary artery bypass graft  02/2006  . Cervical carcinoma       No Known Allergies    No family history on file.   Social History Christina Boyle reports that she has been smoking Cigarettes.  She started smoking about 44 years ago. She has a 50 pack-year smoking history. She has never used smokeless tobacco. Christina Boyle reports that she does not drink alcohol.   Review of Systems CONSTITUTIONAL: No weight loss, fever, chills, weakness or fatigue.  HEENT: Eyes: No visual loss, blurred vision, double vision or yellow sclerae.No hearing loss, sneezing, congestion, runny nose or sore throat.  SKIN: No rash or itching.  CARDIOVASCULAR: per HPI RESPIRATORY: No shortness of breath, cough or sputum.  GASTROINTESTINAL: No anorexia, nausea, vomiting or diarrhea. No abdominal pain or blood.  GENITOURINARY: No burning on urination, no polyuria NEUROLOGICAL: No headache, dizziness, syncope, paralysis, ataxia, numbness or tingling in the extremities. No change in bowel or bladder control.  MUSCULOSKELETAL: No muscle, back pain, joint pain or stiffness.  LYMPHATICS: No enlarged nodes. No history of splenectomy.  PSYCHIATRIC: No history of depression or anxiety.  ENDOCRINOLOGIC: No  reports of sweating, cold or heat intolerance. No polyuria or polydipsia.  Marland Kitchen   Physical Examination p 74 bp 160/80 Wt 210 lbs BMI 38 Gen: resting comfortably, no acute distress HEENT: no scleral icterus, pupils equal round and reactive, no palptable cervical adenopathy,  CV: RRR, no m/r/g, no JVD, + bilateral carotid bruits Resp: Clear to auscultation bilaterally GI: abdomen is soft, non-tender, non-distended, normal bowel sounds, no hepatosplenomegaly MSK: extremities are warm, no  edema.  Skin: warm, no rash Neuro:  no focal deficits Psych: appropriate affect   Diagnostic Studies 12/2012 Echo  LVEF 60-65%, mild to mod LVH, normal diastolic function, mild AS (valve area 1.5 VTI, mean grad 10), RV function mildly reduced   07/2011 Carotid US  1. Bilateral carotid bifurcation and proximal ICA plaque, resulting in less than 50% diameter stenosis. The exam does not exclude plaque ulceration or embolization. Continued surveillance recommended.  02/2006 Cath  FINDINGS:  1. LV: 143/17/26. EF 65% without regional wall motion abnormality.  2. No aortic stenosis or mitral regurgitation.  3. Left main: There is an 80% stenosis of the mid vessel.  4. LAD: Moderate-size vessel giving rise to two small proximal  diagonals and a larger third diagonal. The LAD has heavy  calcification proximally and diffuse mild disease. The third  diagonal has a 70% stenosis proximally. A small medial subdivision  of this has a 99% stenosis.  5. Circumflex: Moderate-sized vessel giving rise to a single  marginal. There is a 70% stenosis in the AV groove portion of the  vessel.  6. RCA: Moderate-size, dominant vessel. There is an 80% ostial  stenosis, a 40% stenosis of the mid vessel, and a 90% stenosis just  before the origin of the PDA.  7. Left subclavian artery: Diffuse 30% stenosis proximally. The LIMA  is widely patent.  8. Aortic coarctation with 30 mmHg translesional gradient.  9. Normal great vessel anatomy. There is mild ostial stenosis of the  left common carotid artery.  IMPRESSION/RECOMMENDATIONS:  1. Severe multivessel coronary disease.  2. Coarctation of aorta.  3. Normal left ventricular systolic function.  Will refer the to cardiac surgery for consideration of coronary artery  bypass grafting and repair of her coarctation.    Jan 2015 PFTs Reason for pulmonary function testing is shortness of breath.  1. Spirometry shows a moderate  ventilatory defect with minimal airflow  obstruction.  2. Lung volumes show air trapping.  3. DLCO is normal.  4. Airway resistance is high confirming the presence of airflow  obstruction.  5. No significant bronchodilator improvement.  6. Arterial blood gas shows relative resting hypoxia.  7. This study is consistent with COPD considering the patient's  smoking history.   10/2013 Carotid US IMPRESSION: 1. Slight interval progression of left-sided heterogeneous atherosclerotic plaque which now results in an estimated 50- 69% diameter ICA stenosis. 2. Stable heterogeneous atherosclerotic plaque on the right resulting in less than 50% diameter ICA stenosis. 3. Vertebral arteries are patent with normal antegrade flow.  01/2014 Lexiscan MPI IMPRESSION: 1. Abnormal study. There is evidence of apical anterior/anterolateral ischemia. Borderline TID ratio increased at 1.2 may also be indicative of subendocardial or balanced ischemia.  2. Normal left ventricular wall motion.  3. Left ventricular ejection fraction 62%  4. Intermediate-risk stress test findings*.  Assessment and Plan  1. CAD  - intermediate risk Lexiscan, symptoms improved but not resolved after starting imdur 30mg  daily. No high risk features on stress testing - increase metoprolol to 75mg  bid for  additional antianginal effects, describes mild tolerable HAs at times on imdur so wont titrate up at this time - is symptoms do not improve or if progress on intensified antianginal therapy consider invasive testing at that time.    F/u 1 month    [name], M.D.

## 2014-02-22 NOTE — Patient Instructions (Signed)
   Increase Lopressor to 75mg  twice daily   Continue all other medications Your physician recommends that you schedule a follow-up appointment in: 1 month

## 2014-03-25 ENCOUNTER — Ambulatory Visit (INDEPENDENT_AMBULATORY_CARE_PROVIDER_SITE_OTHER): Payer: Medicaid Other | Admitting: Cardiology

## 2014-03-25 ENCOUNTER — Encounter: Payer: Self-pay | Admitting: Cardiology

## 2014-03-25 VITALS — BP 188/73 | HR 67 | Ht 62.0 in | Wt 213.8 lb

## 2014-03-25 DIAGNOSIS — I2510 Atherosclerotic heart disease of native coronary artery without angina pectoris: Secondary | ICD-10-CM

## 2014-03-25 NOTE — Progress Notes (Signed)
Clinical Summary Christina Boyle is a 59 y.o.female seen today for follow up of the following medical problesm.   1. CAD  - prior CABG in 2007 3 vessel (LIMA to LAD, SVG to OM, SVG to PDA) - Echo 12/2012 LVEF 60-65%  - exercise MPI: did not reach THR, exercise limited by SOB and chest pain. Converted to [employer]. Apical anterior and anterolateral ischemia, intermediate risk. LVEF 62% - started on imdur 30mg  daily with noted improvement in symptoms, but not resolution. Last visit increased her lopressor to 75mg  bid. Imdur initially caused some headaches but has since resolved.  - since medication changes has not had any chest pain       Past Medical History  Diagnosis Date  . ASCVD (arteriosclerotic cardiovascular disease) 02/2006    coronary artery bypass graft surgery  . Tobacco abuse     discontinued in 03/2006 ;50 pack year consumption resumed  . Chronic bronchitis   . Coarctation of aorta   . Cervical cancer   . Hyperlipidemia   . Hypertension      No Known Allergies   Current Outpatient Prescriptions  Medication Sig Dispense Refill  . aspirin 81 MG tablet Take 81 mg by mouth daily.    Marland Kitchen atorvastatin (LIPITOR) 80 MG tablet Take 1 tablet (80 mg total) by mouth daily. 90 tablet 3  . isosorbide mononitrate (IMDUR) 30 MG 24 hr tablet Take 1 tablet (30 mg total) by mouth daily. 30 tablet 6  . lisinopril-hydrochlorothiazide (PRINZIDE,ZESTORETIC) 20-12.5 MG per tablet Take 1 tablet by mouth daily. 30 tablet 6  . metoprolol (LOPRESSOR) 50 MG tablet Take 1.5 tablets (75 mg total) by mouth 2 (two) times daily. 90 tablet 6  . nitroGLYCERIN (NITROSTAT) 0.4 MG SL tablet Place 1 tablet (0.4 mg total) under the tongue every 5 (five) minutes as needed for chest pain. 25 tablet 3  . tiotropium (SPIRIVA HANDIHALER) 18 MCG inhalation capsule Place 1 capsule (18 mcg total) into inhaler and inhale daily. 30 capsule 12   No current facility-administered medications for this visit.      Past Surgical History  Procedure Laterality Date  . Left breast biopsy      for benign disease  . Cholecystectomy    . Coronary artery bypass graft  02/2006  . Cervical carcinoma       No Known Allergies    No family history on file.   Social History Ms. Matney reports that she has been smoking Cigarettes.  She started smoking about 44 years ago. She has a 50 pack-year smoking history. She has never used smokeless tobacco. Ms. Sian reports that she does not drink alcohol.   Review of Systems CONSTITUTIONAL: No weight loss, fever, chills, weakness or fatigue.  HEENT: Eyes: No visual loss, blurred vision, double vision or yellow sclerae.No hearing loss, sneezing, congestion, runny nose or sore throat.  SKIN: No rash or itching.  CARDIOVASCULAR: per HPI RESPIRATORY: No shortness of breath, cough or sputum.  GASTROINTESTINAL: No anorexia, nausea, vomiting or diarrhea. No abdominal pain or blood.  GENITOURINARY: No burning on urination, no polyuria NEUROLOGICAL: No headache, dizziness, syncope, paralysis, ataxia, numbness or tingling in the extremities. No change in bowel or bladder control.  MUSCULOSKELETAL: No muscle, back pain, joint pain or stiffness.  LYMPHATICS: No enlarged nodes. No history of splenectomy.  PSYCHIATRIC: No history of depression or anxiety.  ENDOCRINOLOGIC: No reports of sweating, cold or heat intolerance. No polyuria or polydipsia.  Marland Kitchen   Physical Examination p  67 bp 188/73 Wt 213 lbs BMI 39 Gen: resting comfortably, no acute distress HEENT: no scleral icterus, pupils equal round and reactive, no palptable cervical adenopathy,  CV: RRR, 3/6 systolic murmur RUSB. Bilateral carotid bruits Resp: Clear to auscultation bilaterally GI: abdomen is soft, non-tender, non-distended, normal bowel sounds, no hepatosplenomegaly MSK: extremities are warm, no edema.  Skin: warm, no rash Neuro:  no focal deficits Psych: appropriate affect   Diagnostic  Studies  12/2012 Echo  LVEF 60-65%, mild to mod LVH, normal diastolic function, mild AS (valve area 1.5 VTI, mean grad 10), RV function mildly reduced   07/2011 Carotid US  1. Bilateral carotid bifurcation and proximal ICA plaque, resulting in less than 50% diameter stenosis. The exam does not exclude plaque ulceration or embolization. Continued surveillance recommended.  02/2006 Cath  FINDINGS:  1. LV: 143/17/26. EF 65% without regional wall motion abnormality.  2. No aortic stenosis or mitral regurgitation.  3. Left main: There is an 80% stenosis of the mid vessel.  4. LAD: Moderate-size vessel giving rise to two small proximal  diagonals and a larger third diagonal. The LAD has heavy  calcification proximally and diffuse mild disease. The third  diagonal has a 70% stenosis proximally. A small medial subdivision  of this has a 99% stenosis.  5. Circumflex: Moderate-sized vessel giving rise to a single  marginal. There is a 70% stenosis in the AV groove portion of the  vessel.  6. RCA: Moderate-size, dominant vessel. There is an 80% ostial  stenosis, a 40% stenosis of the mid vessel, and a 90% stenosis just  before the origin of the PDA.  7. Left subclavian artery: Diffuse 30% stenosis proximally. The LIMA  is widely patent.  8. Aortic coarctation with 30 mmHg translesional gradient.  9. Normal great vessel anatomy. There is mild ostial stenosis of the  left common carotid artery.  IMPRESSION/RECOMMENDATIONS:  1. Severe multivessel coronary disease.  2. Coarctation of aorta.  3. Normal left ventricular systolic function.  Will refer the to cardiac surgery for consideration of coronary artery  bypass grafting and repair of her coarctation.    Jan 2015 PFTs Reason for pulmonary function testing is shortness of breath.  1. Spirometry shows a moderate ventilatory defect with minimal airflow  obstruction.  2. Lung volumes show air trapping.   3. DLCO is normal.  4. Airway resistance is high confirming the presence of airflow  obstruction.  5. No significant bronchodilator improvement.  6. Arterial blood gas shows relative resting hypoxia.  7. This study is consistent with COPD considering the patient's  smoking history.   10/2013 Carotid US IMPRESSION: 1. Slight interval progression of left-sided heterogeneous atherosclerotic plaque which now results in an estimated 50- 69% diameter ICA stenosis. 2. Stable heterogeneous atherosclerotic plaque on the right resulting in less than 50% diameter ICA stenosis. 3. Vertebral arteries are patent with normal antegrade flow.  01/2014 Lexiscan MPI IMPRESSION: 1. Abnormal study. There is evidence of apical anterior/anterolateral ischemia. Borderline TID ratio increased at 1.2 may also be indicative of subendocardial or balanced ischemia.  2. Normal left ventricular wall motion.  3. Left ventricular ejection fraction 62%  4. Intermediate-risk stress test findings*.    Assessment and Plan   1. CAD  - intermediate risk Lexiscan, symptoms resolved with intensified medical therapy - continue current meds   F/u 3 months  [name], M.D.

## 2014-03-25 NOTE — Patient Instructions (Signed)
Your physician recommends that you schedule a follow-up appointment in: 3 MONTHS WITH DR BRANCH  Your physician recommends that you continue on your current medications as directed. Please refer to the Current Medication list given to you today.  Thank you for choosing [facility]!!    

## 2014-04-22 ENCOUNTER — Ambulatory Visit: Payer: Medicaid Other | Admitting: Cardiology

## 2014-05-16 ENCOUNTER — Other Ambulatory Visit: Payer: Self-pay | Admitting: Cardiology

## 2014-05-16 MED ORDER — LISINOPRIL-HYDROCHLOROTHIAZIDE 20-12.5 MG PO TABS: 1.0000 | ORAL_TABLET | Freq: Every day | ORAL | Status: DC

## 2014-05-16 NOTE — Telephone Encounter (Signed)
Needs Lisinopril refill sent to [facility] in [place]

## 2014-06-23 ENCOUNTER — Encounter: Payer: Self-pay | Admitting: *Deleted

## 2014-06-23 ENCOUNTER — Telehealth: Payer: Self-pay | Admitting: *Deleted

## 2014-06-23 ENCOUNTER — Ambulatory Visit (INDEPENDENT_AMBULATORY_CARE_PROVIDER_SITE_OTHER): Payer: Medicaid Other | Admitting: Cardiology

## 2014-06-23 ENCOUNTER — Encounter: Payer: Self-pay | Admitting: Cardiology

## 2014-06-23 VITALS — BP 183/72 | HR 67 | Ht 62.0 in | Wt 206.0 lb

## 2014-06-23 DIAGNOSIS — I10 Essential (primary) hypertension: Secondary | ICD-10-CM | POA: Diagnosis not present

## 2014-06-23 DIAGNOSIS — I2510 Atherosclerotic heart disease of native coronary artery without angina pectoris: Secondary | ICD-10-CM

## 2014-06-23 DIAGNOSIS — E785 Hyperlipidemia, unspecified: Secondary | ICD-10-CM | POA: Diagnosis not present

## 2014-06-23 DIAGNOSIS — Z720 Tobacco use: Secondary | ICD-10-CM

## 2014-06-23 DIAGNOSIS — Z131 Encounter for screening for diabetes mellitus: Secondary | ICD-10-CM

## 2014-06-23 DIAGNOSIS — Z136 Encounter for screening for cardiovascular disorders: Secondary | ICD-10-CM

## 2014-06-23 NOTE — Patient Instructions (Signed)
Your physician wants you to follow-up in: [place] DR. BRANCH You will receive a reminder letter in the mail two months in advance. If you don't receive a letter, please call our office to schedule the follow-up appointment.  RETURN IN 2 WEEKS FOR NURSE VISIT BLOOD PRESSURE CHECK AFTER YOU HAVE BEEN TAKING MEDICATIONS   Your physician recommends that you continue on your current medications as directed. Please refer to the Current Medication list given to you today.  Thank you for choosing [facility]!!

## 2014-06-23 NOTE — Telephone Encounter (Signed)
-----   Message from [name], MD sent at 06/23/2014  1:45 PM EDT ----- Can we please have this patient get labs: CMET, Mg, CBC, TSH, HgbA1c, fasting lipid panel.   Zandra Abts MD

## 2014-06-23 NOTE — Telephone Encounter (Signed)
Orders in for labs. LM for pt. Will mail lab orders and letter for pt to fast

## 2014-06-23 NOTE — Progress Notes (Signed)
Clinical Summary Ms. Gardiner is a 59 y.o.female seen today for follow up of the following medical problems.   1. CAD  - prior CABG in 2007 3 vessel (LIMA to LAD, SVG to OM, SVG to PDA) - Echo 12/2012 LVEF 60-65%  - 01/2014 exercise MPI: did not reach THR, exercise limited by SOB and chest pain. Converted to [employer]. Apical anterior and anterolateral ischemia, intermediate risk. LVEF 62% - started on imdur 30mg  daily with noted improvement in symptoms - denies any chest pain. No SOB or DOE - compliant with meds  2. HTN - does not check at home - compliant with meds, but has not taken yet today.   3. Hyperlipidemia - 10/2013 TC 232 TG 164 HDL 49 LDL 150 - compliant with statin  4. Tobacco abuse - started on chantix by pcp  Past Medical History  Diagnosis Date  . ASCVD (arteriosclerotic cardiovascular disease) 02/2006    coronary artery bypass graft surgery  . Tobacco abuse     discontinued in 03/2006 ;50 pack year consumption resumed  . Chronic bronchitis   . Coarctation of aorta   . Cervical cancer   . Hyperlipidemia   . Hypertension      No Known Allergies   Current Outpatient Prescriptions  Medication Sig Dispense Refill  . aspirin 81 MG tablet Take 81 mg by mouth daily.    Marland Kitchen atorvastatin (LIPITOR) 80 MG tablet Take 1 tablet (80 mg total) by mouth daily. 90 tablet 3  . isosorbide mononitrate (IMDUR) 30 MG 24 hr tablet Take 1 tablet (30 mg total) by mouth daily. 30 tablet 6  . lisinopril-hydrochlorothiazide (PRINZIDE,ZESTORETIC) 20-12.5 MG per tablet Take 1 tablet by mouth daily. 30 tablet 6  . metoprolol (LOPRESSOR) 50 MG tablet Take 1.5 tablets (75 mg total) by mouth 2 (two) times daily. 90 tablet 6  . nitroGLYCERIN (NITROSTAT) 0.4 MG SL tablet Place 1 tablet (0.4 mg total) under the tongue every 5 (five) minutes as needed for chest pain. 25 tablet 3  . tiotropium (SPIRIVA HANDIHALER) 18 MCG inhalation capsule Place 1 capsule (18 mcg total) into inhaler and  inhale daily. 30 capsule 12   No current facility-administered medications for this visit.     Past Surgical History  Procedure Laterality Date  . Left breast biopsy      for benign disease  . Cholecystectomy    . Coronary artery bypass graft  02/2006  . Cervical carcinoma       No Known Allergies    No family history on file.   Social History Ms. Saintil reports that she has been smoking Cigarettes.  She started smoking about 45 years ago. She has a 50 pack-year smoking history. She has never used smokeless tobacco. Ms. Ebey reports that she does not drink alcohol.   Review of Systems CONSTITUTIONAL: No weight loss, fever, chills, weakness or fatigue.  HEENT: Eyes: No visual loss, blurred vision, double vision or yellow sclerae.No hearing loss, sneezing, congestion, runny nose or sore throat.  SKIN: No rash or itching.  CARDIOVASCULAR: per HPI RESPIRATORY: No shortness of breath, cough or sputum.  GASTROINTESTINAL: No anorexia, nausea, vomiting or diarrhea. No abdominal pain or blood.  GENITOURINARY: No burning on urination, no polyuria NEUROLOGICAL: No headache, dizziness, syncope, paralysis, ataxia, numbness or tingling in the extremities. No change in bowel or bladder control.  MUSCULOSKELETAL: No muscle, back pain, joint pain or stiffness.  LYMPHATICS: No enlarged nodes. No history of splenectomy.  PSYCHIATRIC: No history  of depression or anxiety.  ENDOCRINOLOGIC: No reports of sweating, cold or heat intolerance. No polyuria or polydipsia.  Marland Kitchen   Physical Examination p 67 bp 183/72 Wt 206 lbs BMI 38 Gen: resting comfortably, no acute distress HEENT: no scleral icterus, pupils equal round and reactive, no palptable cervical adenopathy,  CV: RRR, 3/6 systolic murmur RUSB, no JVD, + bilateral bruits Resp: Clear to auscultation bilaterally GI: abdomen is soft, non-tender, non-distended, normal bowel sounds, no hepatosplenomegaly MSK: extremities are warm, no edema.    Skin: warm, no rash Neuro:  no focal deficits Psych: appropriate affect   Diagnostic Studies 12/2012 Echo  LVEF 60-65%, mild to mod LVH, normal diastolic function, mild AS (valve area 1.5 VTI, mean grad 10), RV function mildly reduced   07/2011 Carotid US  1. Bilateral carotid bifurcation and proximal ICA plaque, resulting in less than 50% diameter stenosis. The exam does not exclude plaque ulceration or embolization. Continued surveillance recommended.  02/2006 Cath  FINDINGS:  1. LV: 143/17/26. EF 65% without regional wall motion abnormality.  2. No aortic stenosis or mitral regurgitation.  3. Left main: There is an 80% stenosis of the mid vessel.  4. LAD: Moderate-size vessel giving rise to two small proximal  diagonals and a larger third diagonal. The LAD has heavy  calcification proximally and diffuse mild disease. The third  diagonal has a 70% stenosis proximally. A small medial subdivision  of this has a 99% stenosis.  5. Circumflex: Moderate-sized vessel giving rise to a single  marginal. There is a 70% stenosis in the AV groove portion of the  vessel.  6. RCA: Moderate-size, dominant vessel. There is an 80% ostial  stenosis, a 40% stenosis of the mid vessel, and a 90% stenosis just  before the origin of the PDA.  7. Left subclavian artery: Diffuse 30% stenosis proximally. The LIMA  is widely patent.  8. Aortic coarctation with 30 mmHg translesional gradient.  9. Normal great vessel anatomy. There is mild ostial stenosis of the  left common carotid artery.  IMPRESSION/RECOMMENDATIONS:  1. Severe multivessel coronary disease.  2. Coarctation of aorta.  3. Normal left ventricular systolic function.  Will refer the to cardiac surgery for consideration of coronary artery  bypass grafting and repair of her coarctation.    Jan 2015 PFTs Reason for pulmonary function testing is shortness of breath.  1. Spirometry shows a moderate  ventilatory defect with minimal airflow  obstruction.  2. Lung volumes show air trapping.  3. DLCO is normal.  4. Airway resistance is high confirming the presence of airflow  obstruction.  5. No significant bronchodilator improvement.  6. Arterial blood gas shows relative resting hypoxia.  7. This study is consistent with COPD considering the patient's  smoking history.   10/2013 Carotid US IMPRESSION: 1. Slight interval progression of left-sided heterogeneous atherosclerotic plaque which now results in an estimated 50- 69% diameter ICA stenosis. 2. Stable heterogeneous atherosclerotic plaque on the right resulting in less than 50% diameter ICA stenosis. 3. Vertebral arteries are patent with normal antegrade flow.  01/2014 Lexiscan MPI IMPRESSION: 1. Abnormal study. There is evidence of apical anterior/anterolateral ischemia. Borderline TID ratio increased at 1.2 may also be indicative of subendocardial or balanced ischemia.  2. Normal left ventricular wall motion.  3. Left ventricular ejection fraction 62%  4. Intermediate-risk stress test findings*.     Assessment and Plan   1. CAD  - intermediate risk Lexiscan, symptoms resolved with intensified medical therapy - continue current meds at  this time  2. HTN - elevated in clinic, however she has not taken her meds yet. Looking back this has been a frequent pattern for our visits. She will come in for a nursing bp check in the next 1-2 weeks after taking her meds to reevalute  3. Hyperlipidemia - last panel above goal, we will repeat her lipid panel. We had tried getting her on crestor but somehow she is back on lipitor. We will see how her labs look, consider changing back to crestor or consider zetia if not at goal  4. Tobacco - counseled on cessation and associated health benefits. Continue chantix through her pcp  F/u 6 months   [name], M.D.

## 2014-06-29 ENCOUNTER — Other Ambulatory Visit: Payer: Self-pay | Admitting: Cardiology

## 2014-06-29 LAB — COMPREHENSIVE METABOLIC PANEL
ALBUMIN: 4 g/dL (ref 3.5–5.2)
ALT: 30 U/L (ref 0–35)
AST: 19 U/L (ref 0–37)
Alkaline Phosphatase: 63 U/L (ref 39–117)
BUN: 10 mg/dL (ref 6–23)
CALCIUM: 9.9 mg/dL (ref 8.4–10.5)
CHLORIDE: 105 meq/L (ref 96–112)
CO2: 27 mEq/L (ref 19–32)
Creat: 0.89 mg/dL (ref 0.50–1.10)
GLUCOSE: 113 mg/dL — AB (ref 70–99)
Potassium: 4.4 mEq/L (ref 3.5–5.3)
Sodium: 139 mEq/L (ref 135–145)
TOTAL PROTEIN: 6.4 g/dL (ref 6.0–8.3)
Total Bilirubin: 0.5 mg/dL (ref 0.2–1.2)

## 2014-06-29 LAB — CBC
HEMATOCRIT: 38.9 % (ref 36.0–46.0)
HEMOGLOBIN: 12.8 g/dL (ref 12.0–15.0)
MCH: 30.8 pg (ref 26.0–34.0)
MCHC: 32.9 g/dL (ref 30.0–36.0)
MCV: 93.7 fL (ref 78.0–100.0)
MPV: 12.6 fL — ABNORMAL HIGH (ref 8.6–12.4)
Platelets: 154 10*3/uL (ref 150–400)
RBC: 4.15 MIL/uL (ref 3.87–5.11)
RDW: 14.2 % (ref 11.5–15.5)
WBC: 9.2 10*3/uL (ref 4.0–10.5)

## 2014-06-29 LAB — LIPID PANEL
CHOL/HDL RATIO: 4.4 ratio
CHOLESTEROL: 196 mg/dL (ref 0–200)
HDL: 45 mg/dL — ABNORMAL LOW (ref >=46)
LDL Cholesterol: 122 mg/dL — ABNORMAL HIGH (ref 0–99)
TRIGLYCERIDES: 143 mg/dL (ref <150)
VLDL: 29 mg/dL (ref 0–40)

## 2014-06-29 LAB — TSH: TSH: 5.658 u[IU]/mL — ABNORMAL HIGH (ref 0.350–4.500)

## 2014-06-29 LAB — MAGNESIUM: MAGNESIUM: 1.7 mg/dL (ref 1.5–2.5)

## 2014-06-30 ENCOUNTER — Telehealth: Payer: Self-pay | Admitting: *Deleted

## 2014-06-30 LAB — HEMOGLOBIN A1C
Hgb A1c MFr Bld: 6.3 % — ABNORMAL HIGH (ref <5.7)
Mean Plasma Glucose: 134 mg/dL — ABNORMAL HIGH (ref <117)

## 2014-06-30 MED ORDER — ROSUVASTATIN CALCIUM 40 MG PO TABS: 40.0000 mg | ORAL_TABLET | Freq: Every day | ORAL | Status: DC

## 2014-06-30 NOTE — Telephone Encounter (Signed)
Pt voices understanding of plan. D/c's lipitor, sent Crestor 40 mg to pharmacy. Forwarded results to Dr. Luan Pulling. Called to add regular T3 and FREE T4.

## 2014-06-30 NOTE — Telephone Encounter (Signed)
-----   Message from [name], MD sent at 06/30/2014 11:11 AM EDT ----- Cholesterol is too high, please change her lipitor 80 to crestor 40mg  daily. Thyroid test is elevated, please order a FREE T4 and a regular T3. Labs also show prediabetes, I am not sure if this is new finding for her. Please forward to pcp. She needs to work on cutting back on foods rich in carbs/sugars, exercise and weight loss  Zandra Abts MD

## 2014-07-01 ENCOUNTER — Telehealth: Payer: Self-pay | Admitting: *Deleted

## 2014-07-01 LAB — T4, FREE: FREE T4: 1.13 ng/dL (ref 0.80–1.80)

## 2014-07-01 LAB — T3: T3 TOTAL: 102.6 ng/dL (ref 80.0–204.0)

## 2014-07-01 NOTE — Telephone Encounter (Signed)
-----   Message from [name], MD sent at 07/01/2014 12:43 PM EDT ----- Further thyroid testing shows function is normal. Please forward to pcp  Zandra Abts MD

## 2014-07-01 NOTE — Telephone Encounter (Signed)
Forwarded to Dr. Luan Pulling

## 2014-07-05 ENCOUNTER — Telehealth: Payer: Self-pay | Admitting: *Deleted

## 2014-07-05 NOTE — Telephone Encounter (Signed)
Cover my meds PA completed and faxed 07/05/14. Await approval

## 2014-07-19 ENCOUNTER — Ambulatory Visit (INDEPENDENT_AMBULATORY_CARE_PROVIDER_SITE_OTHER): Payer: Medicaid Other | Admitting: *Deleted

## 2014-07-19 VITALS — BP 122/60 | HR 68

## 2014-07-19 DIAGNOSIS — I10 Essential (primary) hypertension: Secondary | ICD-10-CM

## 2014-07-19 NOTE — Progress Notes (Signed)
Pt c/o last 5 days back pain and some dizziness when getting out of bed. Pt says this is on and off. BP and HR WNL. Pt says [facility] has not received anything on Crestor, but PA was sent on 07/06/14 and we confirmed with [facility]. Suspect pt has not f/u with pharmacy as instructed  Will call to confirm. Pt is still taking Lipitor.

## 2014-07-19 NOTE — Patient Instructions (Signed)
Will we call you with any further recommendations after Dr. Harl Bowie reviews your visit today

## 2014-07-21 ENCOUNTER — Other Ambulatory Visit (HOSPITAL_COMMUNITY): Payer: Medicaid Other

## 2014-07-21 ENCOUNTER — Inpatient Hospital Stay (HOSPITAL_COMMUNITY)
Admission: EM | Admit: 2014-07-21 | Discharge: 2014-07-23 | DRG: 378 | Disposition: A | Discharge status: Home / Self Care | Payer: Medicaid Other | Attending: Internal Medicine | Admitting: Internal Medicine

## 2014-07-21 ENCOUNTER — Encounter (HOSPITAL_COMMUNITY): Payer: Self-pay | Admitting: Emergency Medicine

## 2014-07-21 ENCOUNTER — Inpatient Hospital Stay (HOSPITAL_COMMUNITY): Payer: Medicaid Other

## 2014-07-21 ENCOUNTER — Other Ambulatory Visit (HOSPITAL_COMMUNITY): Payer: Self-pay

## 2014-07-21 ENCOUNTER — Emergency Department (HOSPITAL_COMMUNITY): Payer: Medicaid Other

## 2014-07-21 DIAGNOSIS — I248 Other forms of acute ischemic heart disease: Secondary | ICD-10-CM | POA: Diagnosis present

## 2014-07-21 DIAGNOSIS — Z6837 Body mass index (BMI) 37.0-37.9, adult: Secondary | ICD-10-CM | POA: Diagnosis not present

## 2014-07-21 DIAGNOSIS — K250 Acute gastric ulcer with hemorrhage: Secondary | ICD-10-CM | POA: Diagnosis not present

## 2014-07-21 DIAGNOSIS — K922 Gastrointestinal hemorrhage, unspecified: Secondary | ICD-10-CM | POA: Diagnosis not present

## 2014-07-21 DIAGNOSIS — I25119 Atherosclerotic heart disease of native coronary artery with unspecified angina pectoris: Secondary | ICD-10-CM | POA: Diagnosis present

## 2014-07-21 DIAGNOSIS — D649 Anemia, unspecified: Secondary | ICD-10-CM | POA: Diagnosis present

## 2014-07-21 DIAGNOSIS — R7989 Other specified abnormal findings of blood chemistry: Secondary | ICD-10-CM

## 2014-07-21 DIAGNOSIS — F17200 Nicotine dependence, unspecified, uncomplicated: Secondary | ICD-10-CM | POA: Diagnosis present

## 2014-07-21 DIAGNOSIS — Z951 Presence of aortocoronary bypass graft: Secondary | ICD-10-CM

## 2014-07-21 DIAGNOSIS — Z791 Long term (current) use of non-steroidal anti-inflammatories (NSAID): Secondary | ICD-10-CM

## 2014-07-21 DIAGNOSIS — E669 Obesity, unspecified: Secondary | ICD-10-CM | POA: Diagnosis present

## 2014-07-21 DIAGNOSIS — I2510 Atherosclerotic heart disease of native coronary artery without angina pectoris: Secondary | ICD-10-CM | POA: Diagnosis not present

## 2014-07-21 DIAGNOSIS — Z720 Tobacco use: Secondary | ICD-10-CM

## 2014-07-21 DIAGNOSIS — R197 Diarrhea, unspecified: Secondary | ICD-10-CM | POA: Diagnosis present

## 2014-07-21 DIAGNOSIS — I6523 Occlusion and stenosis of bilateral carotid arteries: Secondary | ICD-10-CM | POA: Diagnosis present

## 2014-07-21 DIAGNOSIS — Z79899 Other long term (current) drug therapy: Secondary | ICD-10-CM | POA: Diagnosis not present

## 2014-07-21 DIAGNOSIS — N179 Acute kidney failure, unspecified: Secondary | ICD-10-CM | POA: Diagnosis present

## 2014-07-21 DIAGNOSIS — Z7982 Long term (current) use of aspirin: Secondary | ICD-10-CM

## 2014-07-21 DIAGNOSIS — D62 Acute posthemorrhagic anemia: Secondary | ICD-10-CM | POA: Diagnosis present

## 2014-07-21 DIAGNOSIS — Z8541 Personal history of malignant neoplasm of cervix uteri: Secondary | ICD-10-CM

## 2014-07-21 DIAGNOSIS — R079 Chest pain, unspecified: Secondary | ICD-10-CM

## 2014-07-21 DIAGNOSIS — E785 Hyperlipidemia, unspecified: Secondary | ICD-10-CM | POA: Diagnosis present

## 2014-07-21 DIAGNOSIS — J449 Chronic obstructive pulmonary disease, unspecified: Secondary | ICD-10-CM | POA: Diagnosis present

## 2014-07-21 DIAGNOSIS — R8290 Unspecified abnormal findings in urine: Secondary | ICD-10-CM | POA: Diagnosis present

## 2014-07-21 DIAGNOSIS — Q251 Coarctation of aorta: Secondary | ICD-10-CM | POA: Diagnosis not present

## 2014-07-21 DIAGNOSIS — I10 Essential (primary) hypertension: Secondary | ICD-10-CM | POA: Diagnosis present

## 2014-07-21 DIAGNOSIS — F17210 Nicotine dependence, cigarettes, uncomplicated: Secondary | ICD-10-CM | POA: Diagnosis present

## 2014-07-21 DIAGNOSIS — R072 Precordial pain: Secondary | ICD-10-CM | POA: Diagnosis not present

## 2014-07-21 DIAGNOSIS — I350 Nonrheumatic aortic (valve) stenosis: Secondary | ICD-10-CM | POA: Diagnosis present

## 2014-07-21 DIAGNOSIS — R778 Other specified abnormalities of plasma proteins: Secondary | ICD-10-CM | POA: Insufficient documentation

## 2014-07-21 DIAGNOSIS — K254 Chronic or unspecified gastric ulcer with hemorrhage: Principal | ICD-10-CM | POA: Diagnosis present

## 2014-07-21 DIAGNOSIS — I209 Angina pectoris, unspecified: Secondary | ICD-10-CM

## 2014-07-21 DIAGNOSIS — E538 Deficiency of other specified B group vitamins: Secondary | ICD-10-CM | POA: Diagnosis present

## 2014-07-21 HISTORY — DX: Gastro-esophageal reflux disease without esophagitis: K21.9

## 2014-07-21 HISTORY — DX: Depression, unspecified: F32.A

## 2014-07-21 HISTORY — DX: Major depressive disorder, single episode, unspecified: F32.9

## 2014-07-21 HISTORY — DX: Chronic obstructive pulmonary disease, unspecified: J44.9

## 2014-07-21 LAB — URINALYSIS, ROUTINE W REFLEX MICROSCOPIC
BILIRUBIN URINE: NEGATIVE
GLUCOSE, UA: NEGATIVE mg/dL
HGB URINE DIPSTICK: NEGATIVE
KETONES UR: NEGATIVE mg/dL
Nitrite: NEGATIVE
PROTEIN: NEGATIVE mg/dL
Specific Gravity, Urine: 1.018 (ref 1.005–1.030)
UROBILINOGEN UA: 0.2 mg/dL (ref 0.0–1.0)
pH: 5 (ref 5.0–8.0)

## 2014-07-21 LAB — BASIC METABOLIC PANEL
Anion gap: 8 (ref 5–15)
BUN: 68 mg/dL — AB (ref 6–20)
CHLORIDE: 110 mmol/L (ref 101–111)
CO2: 22 mmol/L (ref 22–32)
CREATININE: 1.32 mg/dL — AB (ref 0.44–1.00)
Calcium: 8.9 mg/dL (ref 8.9–10.3)
GFR calc Af Amer: 49 mL/min — ABNORMAL LOW (ref >60)
GFR, EST NON AFRICAN AMERICAN: 43 mL/min — AB (ref >60)
Glucose, Bld: 127 mg/dL — ABNORMAL HIGH (ref 65–99)
Potassium: 4.2 mmol/L (ref 3.5–5.1)
Sodium: 140 mmol/L (ref 135–145)

## 2014-07-21 LAB — CBC WITH DIFFERENTIAL/PLATELET
BASOS ABS: 0 10*3/uL (ref 0.0–0.1)
Basophils Relative: 0 % (ref 0–1)
EOS PCT: 1 % (ref 0–5)
Eosinophils Absolute: 0.2 10*3/uL (ref 0.0–0.7)
HCT: 23 % — ABNORMAL LOW (ref 36.0–46.0)
Hemoglobin: 7.6 g/dL — ABNORMAL LOW (ref 12.0–15.0)
Lymphocytes Relative: 21 % (ref 12–46)
Lymphs Abs: 2.4 10*3/uL (ref 0.7–4.0)
MCH: 31.1 pg (ref 26.0–34.0)
MCHC: 33 g/dL (ref 30.0–36.0)
MCV: 94.3 fL (ref 78.0–100.0)
Monocytes Absolute: 0.8 10*3/uL (ref 0.1–1.0)
Monocytes Relative: 7 % (ref 3–12)
Neutro Abs: 8.5 10*3/uL — ABNORMAL HIGH (ref 1.7–7.7)
Neutrophils Relative %: 71 % (ref 43–77)
PLATELETS: 142 10*3/uL — AB (ref 150–400)
RBC: 2.44 MIL/uL — ABNORMAL LOW (ref 3.87–5.11)
RDW: 14.3 % (ref 11.5–15.5)
WBC: 11.9 10*3/uL — AB (ref 4.0–10.5)

## 2014-07-21 LAB — CBC
HCT: 30.7 % — ABNORMAL LOW (ref 36.0–46.0)
Hemoglobin: 10.3 g/dL — ABNORMAL LOW (ref 12.0–15.0)
MCH: 30.3 pg (ref 26.0–34.0)
MCHC: 33.6 g/dL (ref 30.0–36.0)
MCV: 90.3 fL (ref 78.0–100.0)
Platelets: 108 10*3/uL — ABNORMAL LOW (ref 150–400)
RBC: 3.4 MIL/uL — AB (ref 3.87–5.11)
RDW: 16 % — ABNORMAL HIGH (ref 11.5–15.5)
WBC: 9 10*3/uL (ref 4.0–10.5)

## 2014-07-21 LAB — FERRITIN: Ferritin: 36 ng/mL (ref 11–307)

## 2014-07-21 LAB — URINE MICROSCOPIC-ADD ON

## 2014-07-21 LAB — PROTIME-INR
INR: 1.21 (ref 0.00–1.49)
Prothrombin Time: 15.5 seconds — ABNORMAL HIGH (ref 11.6–15.2)

## 2014-07-21 LAB — TROPONIN I: Troponin I: 0.07 ng/mL — ABNORMAL HIGH (ref <0.031)

## 2014-07-21 LAB — VITAMIN B12: Vitamin B-12: 105 pg/mL — ABNORMAL LOW (ref 180–914)

## 2014-07-21 LAB — I-STAT TROPONIN, ED: Troponin i, poc: 0.01 ng/mL (ref 0.00–0.08)

## 2014-07-21 LAB — IRON AND TIBC
Iron: 85 ug/dL (ref 28–170)
Saturation Ratios: 24 % (ref 10.4–31.8)
TIBC: 360 ug/dL (ref 250–450)
UIBC: 275 ug/dL

## 2014-07-21 LAB — FOLATE: Folate: 8.2 ng/mL (ref >5.9)

## 2014-07-21 LAB — RETICULOCYTES
RBC.: 2.22 MIL/uL — AB (ref 3.87–5.11)
Retic Count, Absolute: 82.1 10*3/uL (ref 19.0–186.0)
Retic Ct Pct: 3.7 % — ABNORMAL HIGH (ref 0.4–3.1)

## 2014-07-21 LAB — PREPARE RBC (CROSSMATCH)

## 2014-07-21 LAB — POC OCCULT BLOOD, ED: Fecal Occult Bld: POSITIVE — AB

## 2014-07-21 LAB — MRSA PCR SCREENING: MRSA BY PCR: NEGATIVE

## 2014-07-21 MED ORDER — SODIUM CHLORIDE 0.9 % IV SOLN
INTRAVENOUS | Status: AC
  Administered 2014-07-21: 17:00:00 via INTRAVENOUS

## 2014-07-21 MED ORDER — ONDANSETRON HCL 4 MG/2ML IJ SOLN
4.0000 mg | Freq: Once | INTRAMUSCULAR | Status: AC
  Administered 2014-07-21: 4 mg via INTRAVENOUS
  Filled 2014-07-21: qty 2

## 2014-07-21 MED ORDER — PANTOPRAZOLE SODIUM 40 MG IV SOLR: 40.0000 mg | Freq: Two times a day (BID) | INTRAVENOUS | Status: DC

## 2014-07-21 MED ORDER — ONDANSETRON HCL 4 MG/2ML IJ SOLN: 4.0000 mg | Freq: Four times a day (QID) | INTRAMUSCULAR | Status: DC | PRN

## 2014-07-21 MED ORDER — METOPROLOL TARTRATE 25 MG PO TABS
75.0000 mg | ORAL_TABLET | Freq: Two times a day (BID) | ORAL | Status: DC
  Administered 2014-07-21 – 2014-07-23 (×5): 75 mg via ORAL
  Filled 2014-07-21 (×6): qty 1

## 2014-07-21 MED ORDER — PNEUMOCOCCAL VAC POLYVALENT 25 MCG/0.5ML IJ INJ
0.5000 mL | INJECTION | INTRAMUSCULAR | Status: AC
  Administered 2014-07-23: 0.5 mL via INTRAMUSCULAR
  Filled 2014-07-21 (×2): qty 0.5

## 2014-07-21 MED ORDER — TIOTROPIUM BROMIDE MONOHYDRATE 18 MCG IN CAPS
18.0000 ug | ORAL_CAPSULE | Freq: Every day | RESPIRATORY_TRACT | Status: DC
Start: 2014-07-21 — End: 2014-07-21
  Filled 2014-07-21: qty 5

## 2014-07-21 MED ORDER — SODIUM CHLORIDE 0.9 % IV SOLN
80.0000 mg | Freq: Once | INTRAVENOUS | Status: AC
  Administered 2014-07-21: 80 mg via INTRAVENOUS
  Filled 2014-07-21: qty 80

## 2014-07-21 MED ORDER — MORPHINE SULFATE 2 MG/ML IJ SOLN: 1.0000 mg | INTRAMUSCULAR | Status: DC | PRN

## 2014-07-21 MED ORDER — SODIUM CHLORIDE 0.9 % IV BOLUS (SEPSIS)
1000.0000 mL | Freq: Once | INTRAVENOUS | Status: AC
  Administered 2014-07-21: 1000 mL via INTRAVENOUS

## 2014-07-21 MED ORDER — ATORVASTATIN CALCIUM 80 MG PO TABS
80.0000 mg | ORAL_TABLET | Freq: Every day | ORAL | Status: DC
  Administered 2014-07-21 – 2014-07-23 (×3): 80 mg via ORAL
  Filled 2014-07-21 (×3): qty 1

## 2014-07-21 MED ORDER — SODIUM CHLORIDE 0.9 % IV SOLN
INTRAVENOUS | Status: DC
  Administered 2014-07-21: 08:00:00 via INTRAVENOUS

## 2014-07-21 MED ORDER — NITROGLYCERIN 2 % TD OINT
0.5000 [in_us] | TOPICAL_OINTMENT | Freq: Four times a day (QID) | TRANSDERMAL | Status: DC
  Administered 2014-07-21 – 2014-07-23 (×6): 0.5 [in_us] via TOPICAL
  Filled 2014-07-21: qty 30
  Filled 2014-07-21 (×2): qty 1

## 2014-07-21 MED ORDER — NITROGLYCERIN 0.4 MG SL SUBL
0.4000 mg | SUBLINGUAL_TABLET | SUBLINGUAL | Status: DC | PRN
  Administered 2014-07-21 (×4): 0.4 mg via SUBLINGUAL
  Filled 2014-07-21 (×2): qty 1

## 2014-07-21 MED ORDER — NITROGLYCERIN 0.4 MG SL SUBL: 0.4000 mg | SUBLINGUAL_TABLET | SUBLINGUAL | Status: DC | PRN

## 2014-07-21 MED ORDER — DIPHENHYDRAMINE HCL 50 MG/ML IJ SOLN
25.0000 mg | Freq: Once | INTRAMUSCULAR | Status: AC
  Administered 2014-07-21: 25 mg via INTRAVENOUS
  Filled 2014-07-21: qty 1

## 2014-07-21 MED ORDER — MORPHINE SULFATE 2 MG/ML IJ SOLN: 1.0000 mg | Freq: Once | INTRAMUSCULAR | Status: DC

## 2014-07-21 MED ORDER — LOPERAMIDE HCL 2 MG PO CAPS
4.0000 mg | ORAL_CAPSULE | Freq: Once | ORAL | Status: AC
  Administered 2014-07-21: 4 mg via ORAL
  Filled 2014-07-21: qty 2

## 2014-07-21 MED ORDER — ACETAMINOPHEN 325 MG PO TABS: 650.0000 mg | ORAL_TABLET | ORAL | Status: DC | PRN

## 2014-07-21 MED ORDER — SODIUM CHLORIDE 0.9 % IV SOLN
8.0000 mg/h | INTRAVENOUS | Status: DC
  Administered 2014-07-21: 8 mg/h via INTRAVENOUS
  Filled 2014-07-21 (×2): qty 80

## 2014-07-21 MED ORDER — ASPIRIN EC 325 MG PO TBEC
325.0000 mg | DELAYED_RELEASE_TABLET | Freq: Every day | ORAL | Status: DC
  Administered 2014-07-21 – 2014-07-22 (×2): 325 mg via ORAL
  Filled 2014-07-21 (×2): qty 1

## 2014-07-21 MED ORDER — PANTOPRAZOLE SODIUM 40 MG IV SOLR
40.0000 mg | Freq: Two times a day (BID) | INTRAVENOUS | Status: DC
  Administered 2014-07-21 – 2014-07-22 (×3): 40 mg via INTRAVENOUS
  Filled 2014-07-21 (×4): qty 40

## 2014-07-21 MED ORDER — METOCLOPRAMIDE HCL 5 MG/ML IJ SOLN
10.0000 mg | Freq: Once | INTRAMUSCULAR | Status: AC
  Administered 2014-07-21: 10 mg via INTRAVENOUS
  Filled 2014-07-21: qty 2

## 2014-07-21 MED ORDER — SODIUM CHLORIDE 0.9 % IV SOLN
10.0000 mL/h | Freq: Once | INTRAVENOUS | Status: AC
  Administered 2014-07-21: 10 mL/h via INTRAVENOUS

## 2014-07-21 MED ORDER — ALPRAZOLAM 0.25 MG PO TABS
0.2500 mg | ORAL_TABLET | Freq: Two times a day (BID) | ORAL | Status: DC | PRN
  Administered 2014-07-23: 0.25 mg via ORAL
  Filled 2014-07-21: qty 1

## 2014-07-21 MED ORDER — CYANOCOBALAMIN 1000 MCG/ML IJ SOLN: 1000.0000 ug | Freq: Once | INTRAMUSCULAR | Status: DC

## 2014-07-21 MED ORDER — CYANOCOBALAMIN 1000 MCG/ML IJ SOLN
1000.0000 ug | Freq: Every day | INTRAMUSCULAR | Status: DC
  Administered 2014-07-23: 1000 ug via INTRAMUSCULAR
  Filled 2014-07-21 (×3): qty 1

## 2014-07-21 MED ORDER — TIOTROPIUM BROMIDE-OLODATEROL 2.5-2.5 MCG/ACT IN AERS
1.0000 | INHALATION_SPRAY | Freq: Every day | RESPIRATORY_TRACT | Status: DC
  Administered 2014-07-21 – 2014-07-22 (×2): 1 via RESPIRATORY_TRACT

## 2014-07-21 NOTE — Progress Notes (Signed)
  Echocardiogram 2D Echocardiogram has been performed.  Christina Boyle 07/21/2014, 4:49 PM

## 2014-07-21 NOTE — ED Provider Notes (Signed)
CSN: [id number]     Arrival date & time 07/21/14  0056 History   This chart was scribed for Delora Fuel, MD by Chester Holstein, ED Scribe. This patient was seen in room A12C/A12C and the patient's care was started at 1:11 AM.    Chief Complaint  Patient presents with  . Nausea  . Diarrhea    The history is provided by the patient. No language interpreter was used.   HPI Comments: Christina Boyle is a 59 y.o. female brought in by ambulance with PMHx of ASCVD,cervical CA, HLD, HTN, who presents to the Emergency Department complaining of diarrhea with onset 3 days ago. She reports associated nausea, chills, and diaphoresis.  She states nausea is worsened when sitting up. She denies any known alleviating factors. Pt has not taken any medication for relief. Pt denies fever, vomiting, abdominal pain, and hematochezia.   Past Medical History  Diagnosis Date  . ASCVD (arteriosclerotic cardiovascular disease) 02/2006    coronary artery bypass graft surgery  . Tobacco abuse     discontinued in 03/2006 ;50 pack year consumption resumed  . Chronic bronchitis   . Coarctation of aorta   . Cervical cancer   . Hyperlipidemia   . Hypertension    Past Surgical History  Procedure Laterality Date  . Left breast biopsy      for benign disease  . Cholecystectomy    . Coronary artery bypass graft  02/2006  . Cervical carcinoma     No family history on file. History  Substance Use Topics  . Smoking status: Current Every Day Smoker -- 1.00 packs/day for 50 years    Types: Cigarettes    Start date: 05/21/1969  . Smokeless tobacco: Never Used  . Alcohol Use: No   OB History    No data available     Review of Systems  Constitutional: Positive for chills and diaphoresis. Negative for fever.  Gastrointestinal: Positive for nausea and diarrhea. Negative for vomiting, abdominal pain and blood in stool.  All other systems reviewed and are negative.   Allergies  Review of patient's allergies  indicates no known allergies.  Home Medications   Prior to Admission medications   Medication Sig Start Date End Date Taking? Authorizing Provider  aspirin 81 MG tablet Take 81 mg by mouth daily.    Historical Provider, MD  isosorbide mononitrate (IMDUR) 30 MG 24 hr tablet Take 1 tablet (30 mg total) by mouth daily. 02/01/14   [name], MD  lisinopril-hydrochlorothiazide (PRINZIDE,ZESTORETIC) 20-12.5 MG per tablet Take 1 tablet by mouth daily. 05/16/14   [name], MD  metoprolol (LOPRESSOR) 50 MG tablet Take 1.5 tablets (75 mg total) by mouth 2 (two) times daily. 02/22/14   [name], MD  nitroGLYCERIN (NITROSTAT) 0.4 MG SL tablet Place 1 tablet (0.4 mg total) under the tongue every 5 (five) minutes as needed for chest pain. 01/20/14   [name], MD  rosuvastatin (CRESTOR) 40 MG tablet Take 1 tablet (40 mg total) by mouth daily. 06/30/14   [name], MD  tiotropium (SPIRIVA HANDIHALER) 18 MCG inhalation capsule Place 1 capsule (18 mcg total) into inhaler and inhale daily. 10/07/13   [name], MD   BP 163/56 mmHg  Pulse 86  Temp(Src) 98 F (36.7 C) (Oral)  Resp 12  Ht 5\' 2"  (1.575 m)  Wt 207 lb (93.895 kg)  BMI 37.85 kg/m2  SpO2 100% Physical Exam  Constitutional: She is oriented to person, place,  and time. She appears well-developed and well-nourished.  HENT:  Head: Normocephalic.  Eyes: Conjunctivae are normal. Pupils are equal, round, and reactive to light.  Neck: Normal range of motion. Neck supple. No JVD present.  Cardiovascular: Normal rate and regular rhythm.   Murmur (2/6 systolic ejection) heard. Pulmonary/Chest: Effort normal and breath sounds normal. She has no wheezes. She has no rales. She exhibits no tenderness.  Abdominal: Soft. She exhibits no distension and no mass. Bowel sounds are decreased. There is no tenderness.  Musculoskeletal: Normal range of motion. She exhibits no edema.  Lymphadenopathy:    She has no  cervical adenopathy.  Neurological: She is alert and oriented to person, place, and time. No cranial nerve deficit. She exhibits normal muscle tone. Coordination normal.  Skin: Skin is warm and dry. No rash noted.  Psychiatric: She has a normal mood and affect. Her behavior is normal. Judgment and thought content normal.  Nursing note and vitals reviewed.   ED Course  Procedures (including critical care time) DIAGNOSTIC STUDIES: Oxygen Saturation is 100% on room air, normal by my interpretation.    COORDINATION OF CARE: 1:13 AM Discussed treatment plan with patient at beside, the patient agrees with the plan and has no further questions at this time.   Labs Review Results for orders placed or performed during the hospital encounter of 22/63/33  Basic metabolic panel  Result Value Ref Range   Sodium 140 135 - 145 mmol/L   Potassium 4.2 3.5 - 5.1 mmol/L   Chloride 110 101 - 111 mmol/L   CO2 22 22 - 32 mmol/L   Glucose, Bld 127 (H) 65 - 99 mg/dL   BUN 68 (H) 6 - 20 mg/dL   Creatinine, Ser 1.32 (H) 0.44 - 1.00 mg/dL   Calcium 8.9 8.9 - 10.3 mg/dL   GFR calc non Af Amer 43 (L) >60 mL/min   GFR calc Af Amer 49 (L) >60 mL/min   Anion gap 8 5 - 15  CBC with Differential  Result Value Ref Range   WBC 11.9 (H) 4.0 - 10.5 K/uL   RBC 2.44 (L) 3.87 - 5.11 MIL/uL   Hemoglobin 7.6 (L) 12.0 - 15.0 g/dL   HCT 23.0 (L) 36.0 - 46.0 %   MCV 94.3 78.0 - 100.0 fL   MCH 31.1 26.0 - 34.0 pg   MCHC 33.0 30.0 - 36.0 g/dL   RDW 14.3 11.5 - 15.5 %   Platelets 142 (L) 150 - 400 K/uL   Neutrophils Relative % 71 43 - 77 %   Neutro Abs 8.5 (H) 1.7 - 7.7 K/uL   Lymphocytes Relative 21 12 - 46 %   Lymphs Abs 2.4 0.7 - 4.0 K/uL   Monocytes Relative 7 3 - 12 %   Monocytes Absolute 0.8 0.1 - 1.0 K/uL   Eosinophils Relative 1 0 - 5 %   Eosinophils Absolute 0.2 0.0 - 0.7 K/uL   Basophils Relative 0 0 - 1 %   Basophils Absolute 0.0 0.0 - 0.1 K/uL  Urinalysis, Routine w reflex microscopic  Result Value  Ref Range   Color, Urine YELLOW YELLOW   APPearance CLEAR CLEAR   Specific Gravity, Urine 1.018 1.005 - 1.030   pH 5.0 5.0 - 8.0   Glucose, UA NEGATIVE NEGATIVE mg/dL   Hgb urine dipstick NEGATIVE NEGATIVE   Bilirubin Urine NEGATIVE NEGATIVE   Ketones, ur NEGATIVE NEGATIVE mg/dL   Protein, ur NEGATIVE NEGATIVE mg/dL   Urobilinogen, UA 0.2 0.0 - 1.0 mg/dL  Nitrite NEGATIVE NEGATIVE   Leukocytes, UA MODERATE (A) NEGATIVE  Urine microscopic-add on  Result Value Ref Range   Squamous Epithelial / LPF RARE RARE   WBC, UA 7-10 <3 WBC/hpf   RBC / HPF 0-2 <3 RBC/hpf   Bacteria, UA FEW (A) RARE  I-stat troponin, ED (only if pt is 61 y.o. or older & pain is above umbilicus)  not at [facility], [facility]  Result Value Ref Range   Troponin i, poc 0.01 0.00 - 0.08 ng/mL   Comment 3           Imaging Review Dg Chest Port 1 View  07/21/2014   CLINICAL DATA:  Nausea, diarrhea, and shortness of breath tonight  EXAM: PORTABLE CHEST - 1 VIEW  COMPARISON:  05/02/2008  FINDINGS: Postoperative changes in the mediastinum. Normal heart size and pulmonary vascularity. No focal airspace disease or consolidation. No blunting of costophrenic angles. No pneumothorax. Mild scarring in the left mid lung. Calcification of the aorta. No change since prior study.  IMPRESSION: No active disease.   Electronically Signed   By: Lucienne Capers M.D.   On: 07/21/2014 03:19     EKG Interpretation   Date/Time:  Thursday Jul 21 2014 00:56:39 EDT Ventricular Rate:  84 PR Interval:  202 QRS Duration: 90 QT Interval:  360 QTC Calculation: 425 R Axis:   48 Text Interpretation:  Sinus rhythm Abnormal R-wave progression, early  transition Repol abnrm, severe global ischemia (LM/MVD) When compared with  ECG of 03/06/2006, ST-t abnormality is now Present - possibly related to  LVH Confirmed by [facility]  MD, [place] ([id number]) on 07/21/2014 1:02:21 AM       EKG Interpretation   Date/Time:  Thursday Jul 21 2014 02:54:13 EDT Ventricular  Rate:  89 PR Interval:  194 QRS Duration: 84 QT Interval:  358 QTC Calculation: 436 R Axis:   50 Text Interpretation:  Sinus rhythm Abnormal R-wave progression, early  transition Repol abnrm, severe global ischemia (LM/MVD) No significant  change since last tracing Confirmed by [facility]  MD, Jeffie Spivack ([id number]) on  07/21/2014 3:08:14 AM      MDM   Final diagnoses:  Upper gastrointestinal bleed  Prerenal azotemia  Angina pectoris  Diarrhea    Diarrhea of uncertain cause. She has orthostatic symptoms worrisome for dehydration. IV is started with normal saline and she's given loperamide for diarrhea.  Patient did develop chest pain which was relieved with nitroglycerin. EKGs shows no acute changes. Troponin is negative. However, hemoglobin has come back significantly decreased from recent hemoglobin and BUN and creatinine are elevated in pattern suggestive of an upper intestinal bleed. Patient does admit that her stools have been darker than normal and that she has been using a lot of ibuprofen. This is suspicious for either gastritis or bleeding ulcer to cause her hemoglobin drop. Rectal exam was done showing dark but not quite melanotic stool which is Hemoccult positive. Blood transfusion has been ordered. Case is discussed with Dr. Arnoldo Morale of [facility] who agrees to admit the patient.   I personally performed the services described in this documentation, which was scribed in my presence. The recorded information has been reviewed and is accurate.       Delora Fuel, MD 35/46/56 [id number]

## 2014-07-21 NOTE — H&P (Signed)
[facility] History and Physical                                                                                    Christina Boyle, is a 59 y.o. female  MRN: [id number]   DOB - November 22, 1955  Admit Date - 07/21/2014  Outpatient Primary MD for the patient is Alonza Bogus, MD  Referring MD: Dr. Roxanne Mins / ER  With History of -  Past Medical History  Diagnosis Date  . ASCVD (arteriosclerotic cardiovascular disease) 02/2006    coronary artery bypass graft surgery  . Tobacco abuse     discontinued in 03/2006 ;50 pack year consumption resumed  . Chronic bronchitis   . Coarctation of aorta   . Cervical cancer   . Hyperlipidemia   . Hypertension       Past Surgical History  Procedure Laterality Date  . Left breast biopsy      for benign disease  . Cholecystectomy    . Coronary artery bypass graft  02/2006  . Cervical carcinoma      in for   Chief Complaint  Patient presents with  . Nausea  . Diarrhea     HPI This is a 59 year old female patient with past medical history of urinary artery disease and prior CABG in 2007, hypertension, dyslipidemia, ongoing tobacco abuse, known carotid bruit presented to the ER with complaints of diarrhea for 2-3 days associated with dizziness. At the same time she's been having left anterior, nonradiating chest pain associated with cold sweats and shortness of breath during the same period of time. Patient reports she has issues with chronic constipation and has had blood-streaked stools for years but has developed new dark diarrheal stools for the past 3 days. She's not had any abdominal pain. She's not had any recent antibiotics. She has been taking regular doses of NSAIDs for 1-2 weeks for increased back pain. In questioning the patient does not appear she has been having any chest pain prior to onset of current symptoms. She has chest pain both at rest and with exertion. She had an intermediate risk stress test November 2015 which was  performed because of ongoing rest and exertional sharp chest pain. That stress test revealed a normal ejection fraction of 60%.   In the ER patient had increased downsloping ST segments in her inferolateral leads compared to previous EKG but her initial troponin was negative. She was fecal occult blood positive. She has been given nitroglycerin for chest discomfort which has significantly decreased the chest pain but the chest pain has not resolved. Subsequently nitroglycerin paste has been added.  At presentation she was afebrile, normotensive and not tachycardic. She was orthostatic when checked noting her blood pressure dropped from 164/56 supine to 122/29 with standing. She is maintaining O2 saturations of 98% on room air. When I question the patient regarding her chest pain she pointed to the left side of her chest said she was having significant pressure there that did not radiate and confirmed this was associated with cold sweats and shortness of breath that occurred with rest. She had not been feeling well and her back has been hurting so she  was not mobilizing much but she thought she was also having some chest discomfort with exertion. As noted above the EKGs have been checked while patient was having chest pain and seemed to have some ST downsloping in the inferior lateral leads. She also reported that she was aching all over.   Review of Systems   In addition to the HPI above,  No Fever-chills or other constitutional symptoms No Headache, changes with Vision or hearing, new weakness, tingling, numbness in any extremity, No problems swallowing food or Liquids, indigestion/reflux No Cough, palpitations, orthopnea or DOE No Abdominal pain, N/V No dysuria, hematuria or flank pain No new skin rashes, lesions, masses or bruises, No recent weight gain or loss No polyuria, polydypsia or polyphagia,  *A full 10 point Review of Systems was done, except as stated above, all other Review of  Systems were negative.  Social History History  Substance Use Topics  . Smoking status: Current Every Day Smoker -- 1.00 packs/day for 50 years    Types: Cigarettes    Start date: 05/21/1969  . Smokeless tobacco: Never Used  . Alcohol Use: No    Resides at: Private residence  Lives with: Husband  Ambulatory status: Ambulatory without the aid of assistive devices prior to admission   Family History Family history reviewed and nonpertinent to current symptoms and admitting diagnoses   Prior to Admission medications   Medication Sig Start Date End Date Taking? Authorizing Provider  aspirin EC 325 MG tablet Take 325 mg by mouth daily.   Yes Historical Provider, MD  atorvastatin (LIPITOR) 80 MG tablet Take 80 mg by mouth daily.   Yes Historical Provider, MD  ibuprofen (ADVIL,MOTRIN) 200 MG tablet Take 600 mg by mouth every 6 (six) hours as needed (pain).   Yes Historical Provider, MD  isosorbide mononitrate (IMDUR) 30 MG 24 hr tablet Take 1 tablet (30 mg total) by mouth daily. 02/01/14  Yes [name], MD  lisinopril-hydrochlorothiazide (PRINZIDE,ZESTORETIC) 20-12.5 MG per tablet Take 1 tablet by mouth daily. 05/16/14  Yes [name], MD  metoprolol (LOPRESSOR) 50 MG tablet Take 1.5 tablets (75 mg total) by mouth 2 (two) times daily. 02/22/14  Yes [name], MD  nitroGLYCERIN (NITROSTAT) 0.4 MG SL tablet Place 1 tablet (0.4 mg total) under the tongue every 5 (five) minutes as needed for chest pain. 01/20/14  Yes [name], MD  Tiotropium Bromide-Olodaterol 2.5-2.5 MCG/ACT AERS Inhale 1 puff into the lungs daily.   Yes Historical Provider, MD  aspirin 81 MG tablet Take 81 mg by mouth daily.    Historical Provider, MD  rosuvastatin (CRESTOR) 40 MG tablet Take 1 tablet (40 mg total) by mouth daily. 06/30/14   [name], MD  tiotropium (SPIRIVA HANDIHALER) 18 MCG inhalation capsule Place 1 capsule (18 mcg total) into inhaler and inhale daily. 10/07/13    [name], MD    No Known Allergies  Physical Exam  Vitals  Blood pressure 159/43, pulse 95, temperature 98 F (36.7 C), temperature source Oral, resp. rate 17, height 5\' 2"  (1.575 m), weight 207 lb (93.895 kg), SpO2 97 %.   General:  In mild acute distress evidence of ongoing left anterior chest pain  Psych:  Normal affect, Denies Suicidal or Homicidal ideations, Awake Alert, Oriented X 3. Speech and thought patterns are clear and appropriate, no apparent short term memory deficits  Neuro:   No focal neurological deficits, CN II through XII intact, Strength 5/5 all 4 extremities, Sensation  intact all 4 extremities.  ENT:  Ears and Eyes appear Normal, Conjunctivae clear, PER. Moist oral mucosa without erythema or exudates.  Neck:  Supple, No lymphadenopathy appreciated  Respiratory:  Symmetrical chest wall movement, Good air movement bilaterally, CTAB. Room Air  Cardiac:  RRR, systolic murmur RSB grade 3/6, no LE edema noted, no JVD, No carotid bruits, peripheral pulses palpable at 2+  Abdomen:  Positive bowel sounds, Soft, Non tender, Non distended,  No masses appreciated, no obvious hepatosplenomegaly  Skin:  No Cyanosis, Normal Skin Turgor, No Skin Rash or Bruise.  Extremities: Symmetrical without obvious trauma or injury,  no effusions.  Data Review  CBC  Recent Labs Lab 07/21/14 0123  WBC 11.9*  HGB 7.6*  HCT 23.0*  PLT 142*  MCV 94.3  MCH 31.1  MCHC 33.0  RDW 14.3  LYMPHSABS 2.4  MONOABS 0.8  EOSABS 0.2  BASOSABS 0.0    Chemistries   Recent Labs Lab 07/21/14 0123  NA 140  K 4.2  CL 110  CO2 22  GLUCOSE 127*  BUN 68*  CREATININE 1.32*  CALCIUM 8.9    estimated creatinine clearance is 47.8 mL/min (by C-G formula based on Cr of 1.32).  No results for input(s): TSH, T4TOTAL, T3FREE, THYROIDAB in the last 72 hours.  Invalid input(s): FREET3  Coagulation profile No results for input(s): INR, PROTIME in the last 168 hours.  No  results for input(s): DDIMER in the last 72 hours.  Cardiac Enzymes  Recent Labs Lab 07/21/14 0740  TROPONINI 0.07*    Invalid input(s): POCBNP  Urinalysis    Component Value Date/Time   COLORURINE YELLOW 07/21/2014 [place] 07/21/2014 0333   LABSPEC 1.018 07/21/2014 0333   PHURINE 5.0 07/21/2014 0333   GLUCOSEU NEGATIVE 07/21/2014 0333   HGBUR NEGATIVE 07/21/2014 0333   BILIRUBINUR NEGATIVE 07/21/2014 0333   KETONESUR NEGATIVE 07/21/2014 0333   PROTEINUR NEGATIVE 07/21/2014 0333   UROBILINOGEN 0.2 07/21/2014 0333   NITRITE NEGATIVE 07/21/2014 0333   LEUKOCYTESUR MODERATE* 07/21/2014 0333    Imaging results:   Dg Chest Port 1 View  07/21/2014   CLINICAL DATA:  Nausea, diarrhea, and shortness of breath tonight  EXAM: PORTABLE CHEST - 1 VIEW  COMPARISON:  05/02/2008  FINDINGS: Postoperative changes in the mediastinum. Normal heart size and pulmonary vascularity. No focal airspace disease or consolidation. No blunting of costophrenic angles. No pneumothorax. Mild scarring in the left mid lung. Calcification of the aorta. No change since prior study.  IMPRESSION: No active disease.   Electronically Signed   By: Lucienne Capers M.D.   On: 07/21/2014 03:19     EKG: (Independently reviewed) sinus rhythm with diffuse ST segment downsloping which is more prominent when compared to EKG from November 2015 and is new in the inferior and septal leads, QTC is 409   Assessment & Plan  Principal Problem:   Chest pain/history of CABG -Admit to stepdown -Cycle cardiac enzymes -Continue beta blocker -No aspirin due to presumed upper GI bleeding -No heparin due to presumed upper GI bleeding -Consult cardiology -Half-inch Nitropaste every 6 hours -Check 2-D echocardiogram -Supportive care with when necessary oxygen and when necessary IV morphine  Active Problems:   Upper gastrointestinal bleed -Fecal occult blood positive with associated dark  stools -Gastroenterology consulted -Nothing by mouth -Continue Protonix infusion    Symptomatic anemia -Check anemia panel -TSH in April slightly elevated with normal free T4 and T3 -Type and screen and transfuse 2 units packed red blood  cells -Check CBC after second unit infused and thereafter every 12 hours    Hypertension -Hold ACE inhibitor and diuretic in setting of acute renal failure -Continue beta blocker but do so cautiously noting patient's blood pressure can dip into the 1:15 after sublingual nitroglycerin and she was also profoundly orthostatic-suspect blood pressure will stabilize after receipt of packed red blood cells    Acute renal failure -Baseline renal function 10/0.89 -Current renal function 68/1.3 to -Holding offending medications as above -Follow labs    Azotemia -Secondary to upper GI bleeding    Abnormal urinalysis -Could explain patient's recent back pain -Check urine culture -Since no fever or leukocytosis no indication at this juncture to begin empiric antibiotics    Hyperlipemia -Hold statin while nothing by mouth    TOBACCO ABUSE/COPD -Admits to smoking at least one pack per day -Currently not wheezing on exam -When necessary DuoNeb    COARCTATION OF AORTA -Stable and did not require surgical correction during CABG procedure in 2007    DVT Prophylaxis: SCDs  Family Communication:   Husband at bedside  Code Status:  Full code  Condition:  Guarded  Discharge disposition: Anticipate will discharge back to home environment with husband pending gastroenterology and cardiology evaluation  Time spent in minutes : 60      Jo Cerone L. ANP on 07/21/2014 at 9:09 AM  Between 7am to 7pm - Pager - [phone]  After 7pm go to [email or web address] - password [email or web address]  And look for the night coverage person covering me after hours  [facility]

## 2014-07-21 NOTE — Progress Notes (Signed)
Vit B12 level markedly low at 105 so will ck MMA in am and begin B12 injections in am.  Erin Hearing, ANP

## 2014-07-21 NOTE — ED Notes (Signed)
Per EMS: back pain, nausea vomiting and diarrhea x 3 days, clammy and cold with EMS, ST segment depression throughout reported by EMS, Last BP before arrival to this facility was 79/58 after 250 cc nacl administration.  Patients first BP at this facility was 164/39, patient alert and oriented, mentating well, color decent.  Afebrile at 98.0 orally, HR 84.  Denies pain, appears somewhat pale.

## 2014-07-21 NOTE — ED Notes (Addendum)
Pt. Said no complaints of dizziness or sob upon standing but when sitting back down did have some dizziness and sob.

## 2014-07-21 NOTE — ED Notes (Signed)
poc occult stool collected by Dr. Roxanne Mins

## 2014-07-21 NOTE — Progress Notes (Signed)
Patient admitted from ED. Report received from [place], [place]. VSS, tele in place, no c/o pain. 1 unit of blood transfusion. Patient oriented to room. Bed is low and brakes are locked. Call bell within reach. All questions answered. Will continue to monitor.

## 2014-07-21 NOTE — ED Notes (Signed)
Hospitalist at bedside 

## 2014-07-21 NOTE — ED Notes (Signed)
Dr. Glick at bedside.  

## 2014-07-21 NOTE — ED Notes (Signed)
Patient called out c/o pain in her chest.  Dr. Roxanne Mins notified.

## 2014-07-21 NOTE — Consult Note (Signed)
[facility] Consult: 8:25 AM 07/21/2014  LOS: 0 days    Referring Provider: Debbora Lacrosse NP for Hospitalist.   Primary Care Physician:  Alonza Bogus, MD Primary Gastroenterologist:  Althia Forts.      Reason for Consultation:  Melena and blood loss anemia.   HPI: Christina Boyle is a 59 y.o. female.  S/p CABG in 2007.  Hx cervical cancer. Obesity.  Hyperlipidemia. Patient had not been to the doctor in many years before establishing with Dr. Luan Pulling a few months ago. She is not up-to-date and has never undergone clonic scopic screening nor has she ever had upper endoscopy. She hasn't had a mammogram or Pap smear in at least a decade. Appetites been diminished for a month or so and she's lost 10 or 15 pounds. However she currently weighs 207# and in 2012 she weighed 197#.   She has chronic low back pain which has never been investigated by medical providers. For pain control she's been taking 600 mg ibuprofen 3-4 times daily for at least 6 months she has a lot of dyspepsia/heartburn but not nausea or vomiting. She treats symptomatically with frequent use of generic Tums.  She also has chronic constipation and moves her bowels maybe 2 or 3 times a week. She uses laxatives, lately she's been using generic MiraLAX. Occasionally will see blood in her stool. This is not accelerated in recent weeks. No dysphagia.  For the last couple of days the patient has had "diarrhea". This is loose, black/tarry stools about 5 or so per day. She has nausea but hasn't gotten to the point of vomiting. She feels terrible. No S3 36 hours she's developed orthostatic dizziness. She is also experiencing up to a minutes worth of left-sided chest pain, mostly with exertion. She feels weak, presyncopal and presented to the emergency room.  Hgb  drop from 12.8 06/29/2014, 7.6 today. Reticulocyte count is elevated. BUN/creat from 10/0.8 to 68/1.3.  No coags ordered. First troponin I is 0.01, normal thus far.  Some downsloping on the ST segment in the inferior lateral leads is noted on EKG. Stool is black and FOBT positive. She has been started on a PPI drip.   Past Medical History  Diagnosis Date  . ASCVD (arteriosclerotic cardiovascular disease) 02/2006    coronary artery bypass graft surgery  . Tobacco abuse     discontinued in 03/2006 ;50 pack year consumption resumed  . Chronic bronchitis   . Coarctation of aorta   . Cervical cancer   . Hyperlipidemia   . Hypertension     Past Surgical History  Procedure Laterality Date  . Left breast biopsy      for benign disease  . Cholecystectomy    . Coronary artery bypass graft  02/2006  . Cervical carcinoma      Prior to Admission medications   Medication Sig Start Date End Date Taking? Authorizing Provider  aspirin EC 325 MG tablet Take 325 mg by mouth daily.   Yes Historical Provider, MD  atorvastatin (LIPITOR) 80 MG tablet Take 80 mg by mouth daily.  Yes Historical Provider, MD  ibuprofen (ADVIL,MOTRIN) 200 MG tablet Take 600 mg by mouth every 6 (six) hours as needed (pain).   Yes Historical Provider, MD  isosorbide mononitrate (IMDUR) 30 MG 24 hr tablet Take 1 tablet (30 mg total) by mouth daily. 02/01/14  Yes [name], MD  lisinopril-hydrochlorothiazide (PRINZIDE,ZESTORETIC) 20-12.5 MG per tablet Take 1 tablet by mouth daily. 05/16/14  Yes [name], MD  metoprolol (LOPRESSOR) 50 MG tablet Take 1.5 tablets (75 mg total) by mouth 2 (two) times daily. 02/22/14  Yes [name], MD  nitroGLYCERIN (NITROSTAT) 0.4 MG SL tablet Place 1 tablet (0.4 mg total) under the tongue every 5 (five) minutes as needed for chest pain. 01/20/14  Yes [name], MD  Tiotropium Bromide-Olodaterol 2.5-2.5 MCG/ACT AERS Inhale 1 puff into the lungs daily.   Yes Historical  Provider, MD  aspirin 81 MG tablet Take 81 mg by mouth daily.    Historical Provider, MD  rosuvastatin (CRESTOR) 40 MG tablet Take 1 tablet (40 mg total) by mouth daily. 06/30/14   [name], MD  tiotropium (SPIRIVA HANDIHALER) 18 MCG inhalation capsule Place 1 capsule (18 mcg total) into inhaler and inhale daily. 10/07/13   [name], MD    Scheduled Meds: .  morphine injection  1 mg Intravenous Once  . nitroGLYCERIN  0.5 inch Topical 4 times per day  . [START ON 07/24/2014] pantoprazole (PROTONIX) IV  40 mg Intravenous Q12H   Infusions: . sodium chloride 10 mL/hr at 07/21/14 0802  . pantoprozole (PROTONIX) infusion 8 mg/hr (07/21/14 0757)   PRN Meds: morphine injection, nitroGLYCERIN, nitroGLYCERIN   Allergies as of 07/21/2014  . (No Known Allergies)    No family history on file.  History   Social History  . Marital Status: Married    Spouse Name: N/A  . Number of Children: N/A  . Years of Education: N/A   Occupational History  . Not on file.   Social History Main Topics  . Smoking status: Current Every Day Smoker -- 1.00 packs/day for 50 years    Types: Cigarettes    Start date: 05/21/1969  . Smokeless tobacco: Never Used  . Alcohol Use: No  . Drug Use: No  . Sexual Activity: Not on file   Other Topics Concern  . Not on file   Social History Narrative    REVIEW OF SYSTEMS: Constitutional:  Per HPI ENT:  Single episode of epistaxis about a month ago. It was not severe and she did not seek medical attention for this. Pulm:  Chronic dyspnea on exertion. Gets short of breath with stair climbing. Trying to quit smoking but prescription for Chantix caused her to feel ill and she stopped it. She is down from over a pack per day to under a pack per day. CV:  No palpitations, no LE edema. Plus left-sided chest pain lasting up to a minute at a time, unpredictable in onset but mostly exertional. GU:  No hematuria, no frequency.  Last Pap smear was at least  10 years ago. Last mammogram probably at least 10 years ago as well but not abnormal. GI:  Per HPI Heme:  No history of having low blood counts. No history of having to take iron supplements.  Transfusions:  None ever Neuro:  Occasional numbness and tingling in hands and feet. No vision change, no limb weakness. Derm:  No itching, no rash or sores.  Endocrine:  No sweats or chills.  No polyuria  or dysuria Immunization:  Up-to-date with flu shot. Doesn't think she's had the pneumonia vaccine and believes she is up-to-date on tetanus. Travel:  None beyond local counties in last few months.    PHYSICAL EXAM: Vital signs in last 24 hours: Filed Vitals:   07/21/14 0815  BP: 165/52  Pulse: 92  Temp:   Resp: 16   Wt Readings from Last 3 Encounters:  07/21/14 207 lb (93.895 kg)  06/23/14 206 lb (93.441 kg)  03/25/14 213 lb 12.8 oz (96.979 kg)    General: Obese, somewhat pale, tired appearing WF. Head:  No facial asymmetry, no signs of head trauma.  Eyes:  No scleral icterus or conjunctival pallor. Ears:  Not hard of hearing  Nose:  No congestion, no discharge. Mouth:  Clear moist, edentulous. Upper dentures in place. Neck:  No JVD, no masses, no TMG. Lungs:  CTA bilaterally. No cough or dyspnea. Heart: RRR. No MRG. S1-S2 audible. Rate in 90s and sinus rhythm on telemetry monitor. Abdomen:  Soft, NT, ND.  Active bowel sounds. No mass, no HSM, no bruits, no hernias..   Rectal: Deferred. This was performed by ED physician resulting in Keith, FOBT positive stool.   Musc/Skeltl: No joint swelling, redness or deformity Extremities:  No pedal edema. Feet are warm dry and well perfused.  Neurologic:  Oriented 3, alert. Good historian. No tremor. No limb weakness. Skin:  No telangiectasia or rashes or sores. Nodes:  No cervical or inguinal adenopathy.   Psych:  Pleasant, bland affect. Not agitated or anxious.   LAB RESULTS:  Recent Labs  07/21/14 0123  WBC 11.9*  HGB 7.6*  HCT  23.0*  PLT 142*  MCV     94  BMET Lab Results  Component Value Date   NA 140 07/21/2014   NA 139 06/29/2014   NA 139 10/11/2013   K 4.2 07/21/2014   K 4.4 06/29/2014   K 4.4 10/11/2013   CL 110 07/21/2014   CL 105 06/29/2014   CL 104 10/11/2013   CO2 22 07/21/2014   CO2 27 06/29/2014   CO2 27 10/11/2013   GLUCOSE 127* 07/21/2014   GLUCOSE 113* 06/29/2014   GLUCOSE 109* 10/11/2013   BUN 68* 07/21/2014   BUN 10 06/29/2014   BUN 8 10/11/2013   CREATININE 1.32* 07/21/2014   CREATININE 0.89 06/29/2014   CREATININE 0.81 10/11/2013   CALCIUM 8.9 07/21/2014   CALCIUM 9.9 06/29/2014   CALCIUM 9.4 10/11/2013     RADIOLOGY STUDIES: Dg Chest Port 1 View  07/21/2014   CLINICAL DATA:  Nausea, diarrhea, and shortness of breath tonight  EXAM: PORTABLE CHEST - 1 VIEW  COMPARISON:  05/02/2008  FINDINGS: Postoperative changes in the mediastinum. Normal heart size and pulmonary vascularity. No focal airspace disease or consolidation. No blunting of costophrenic angles. No pneumothorax. Mild scarring in the left mid lung. Calcification of the aorta. No change since prior study.  IMPRESSION: No active disease.   Electronically Signed   By: Lucienne Capers M.D.   On: 07/21/2014 03:19    ENDOSCOPIC STUDIES: None ever.   IMPRESSION:   *  ABL anemia.  Normocytic.  5 gram drop Hgb in last 22 days.   *  UGI bleed, acute.  Likely NSAID ulcer.   *  Left chest pain.  Rule out MI, CAD.  Obesity, smoking, sedentary lifestyle, hyperlipidemia, glucose intolerance are risk factors.   *  ARF.  BUN likely up partially due to GIB.   *  Chronic  low back pain, not diagnosed as to cause but suspect osteoarthritis and/or degenerative disc disease. Will ultimately need to be investigated as an outpatient so the patient can avoid use of NSAIDs in the future.    PLAN:     *  EGD when rules out for MI. Cardiology has been called in to see the patient. Can have clears for now.  We'll go ahead and arrange  a slot for upper endoscopy tomorrow, assuming that she is safe to undergo this study.  *  Obtain coags. Do not suspect coagulopathy but need to rule it out.  *  Switch to BID IV Protonix in place of drip.  Stopped orders for infectious diarrhea studies and contact precautions., the diarrhea is secondary to GIB.   *  Transfuse 2 PRBCs as ordered.   *  Clears.    Azucena Freed  07/21/2014, 8:25 AM Pager: [phone]    [facility] Attending  I have also seen and assessed the patient and agree with the advanced practitioner's assessment and plan. Troponin is + but cardiology not anticipating any diagnostic evaluation as think demand ischemia.  Plan for EGD to evaluate UGI Bleed The risks and benefits as well as alternatives of endoscopic procedure(s) have been discussed and reviewed. All questions answered. The patient agrees to proceed.  [name], MD, [facility] [phone] (pager) 07/21/2014 8:03 PM

## 2014-07-21 NOTE — ED Notes (Signed)
Ordered clear liquid diet for pt.

## 2014-07-21 NOTE — ED Notes (Signed)
Patient complains of chest pain. Called Dr. Roxanne Mins to report.

## 2014-07-21 NOTE — Consult Note (Signed)
CARDIOLOGY CONSULT NOTE  Patient ID: Christina Boyle MRN: [id number] DOB/AGE: October 02, 1955 59 y.o.  Admit date: 07/21/2014 Primary Physician Alonza Bogus, MD Primary Cardiologist Dr. Harl Bowie Chief Complaint  Chest pain  HPI: The patient is presenting with chest pain.  She is found tohave a mildly elevated Troponin at 0.07.  Her EKG demonstrates some lateral and anterior T-wave inversion. This is increased compared with previous although she's had these T-wave inversions thought to be repolarization previously. Her rate is higher.  She says that she has not been feeling well for a days or weeks.  She has had increased dizziness.  She has been tired.  Two days ago she had sharp back pain that comes and goes.  For the past 36 hours she has had some intermittent severe sharp anterior chest pain as well.  She thinks this could be similar to her angina from years ago.  However, it is not as severe.  Of note she has had some dark black diarrhea yesterday.  She has chonic back pain and takes many ibuprofen per week.  In the ED is found to have a new anemia with guaiac positive stools.  She is being transfused.   Her past cardiac histroy includes CABG in 2007.  Echo in 2014 demonstrated well-preserved ejection fraction 65%. She had some mild aortic stenosis.  Stress perfusion study in November of last year demonstrated anterior and anterolateral ischemia that was thought to be a small area. She was managed medically.  She was medically at that time.   She said that prior to the fatigue and tiredness over the past few weeks she had been doing well.  Prior to this the patient denies any new symptoms such as chest discomfort, neck or arm discomfort. There has been no new shortness of breath, PND or orthopnea. There have been no reported palpitations, presyncope or syncope.   Past Medical History  Diagnosis Date  . ASCVD (arteriosclerotic cardiovascular disease) 02/2006    coronary artery bypass graft  surgery  . Tobacco abuse     discontinued in 03/2006 ;50 pack year consumption resumed  . Chronic bronchitis   . Coarctation of aorta   . Cervical cancer   . Hyperlipidemia   . Hypertension     Past Surgical History  Procedure Laterality Date  . Left breast biopsy      for benign disease  . Cholecystectomy    . Coronary artery bypass graft  02/2006  . Cervical carcinoma      No Known Allergies   Prior to Admission medications   Medication Sig Start Date End Date Taking? Authorizing Provider  aspirin EC 325 MG tablet Take 325 mg by mouth daily.   Yes Historical Provider, MD  atorvastatin (LIPITOR) 80 MG tablet Take 80 mg by mouth daily.   Yes Historical Provider, MD  ibuprofen (ADVIL,MOTRIN) 200 MG tablet Take 600 mg by mouth every 6 (six) hours as needed (pain).   Yes Historical Provider, MD  isosorbide mononitrate (IMDUR) 30 MG 24 hr tablet Take 1 tablet (30 mg total) by mouth daily. 02/01/14  Yes [name], MD  lisinopril-hydrochlorothiazide (PRINZIDE,ZESTORETIC) 20-12.5 MG per tablet Take 1 tablet by mouth daily. 05/16/14  Yes [name], MD  metoprolol (LOPRESSOR) 50 MG tablet Take 1.5 tablets (75 mg total) by mouth 2 (two) times daily. 02/22/14  Yes [name], MD  nitroGLYCERIN (NITROSTAT) 0.4 MG SL tablet Place 1 tablet (0.4 mg total) under the tongue every 5 (five)  minutes as needed for chest pain. 01/20/14  Yes [name], MD  Tiotropium Bromide-Olodaterol 2.5-2.5 MCG/ACT AERS Inhale 1 puff into the lungs daily.   Yes Historical Provider, MD  aspirin 81 MG tablet Take 81 mg by mouth daily.    Historical Provider, MD  rosuvastatin (CRESTOR) 40 MG tablet Take 1 tablet (40 mg total) by mouth daily. 06/30/14   [name], MD  tiotropium (SPIRIVA HANDIHALER) 18 MCG inhalation capsule Place 1 capsule (18 mcg total) into inhaler and inhale daily. 10/07/13   [name], MD     (Not in a hospital admission) No family history on file.  History    Social History  . Marital Status: Married    Spouse Name: N/A  . Number of Children: N/A  . Years of Education: N/A   Occupational History  . Not on file.   Social History Main Topics  . Smoking status: Current Every Day Smoker -- 1.00 packs/day for 50 years    Types: Cigarettes    Start date: 05/21/1969  . Smokeless tobacco: Never Used  . Alcohol Use: No  . Drug Use: No  . Sexual Activity: Not on file   Other Topics Concern  . Not on file   Social History Narrative     ROS:    As stated in the HPI and negative for all other systems.  Physical Exam: Blood pressure 153/43, pulse 93, temperature 98.6 F (37 C), temperature source Oral, resp. rate 18, height 5\' 2"  (1.575 m), weight 207 lb (93.895 kg), SpO2 96 %.  GENERAL:  Well appearing HEENT:  Pupils equal round and reactive, fundi not visualized, oral mucosa unremarkable NECK:  No jugular venous distention, waveform within normal limits, carotid upstroke brisk and symmetric, transmitted murmur vs bilateral bruits, no thyromegaly LYMPHATICS:  No cervical, inguinal adenopathy LUNGS:  Clear to auscultation bilaterally BACK:  No CVA tenderness CHEST:  Unremarkable HEART:  PMI not displaced or sustained,S1 and S2 within normal limits, no S3, no S4, no clicks, no rubs, 3/6 apical early peaking systolic murmur, no diastolic murmurs ABD:  Flat, positive bowel sounds normal in frequency in pitch, no bruits, no rebound, no guarding, no midline pulsatile mass, no hepatomegaly, no splenomegaly EXT:  2 plus pulses throughout, no edema, no cyanosis no clubbing SKIN:  No rashes no nodules NEURO:  Cranial nerves II through XII grossly intact, motor grossly intact throughout PSYCH:  Cognitively intact, oriented to person place and time  Labs: Lab Results  Component Value Date   BUN 68* 07/21/2014   Lab Results  Component Value Date   CREATININE 1.32* 07/21/2014   Lab Results  Component Value Date   NA 140 07/21/2014   K 4.2  07/21/2014   CL 110 07/21/2014   CO2 22 07/21/2014   Lab Results  Component Value Date   TROPONINI 0.07* 07/21/2014   Lab Results  Component Value Date   WBC 11.9* 07/21/2014   HGB 7.6* 07/21/2014   HCT 23.0* 07/21/2014   MCV 94.3 07/21/2014   PLT 142* 07/21/2014    Radiology:   CXR: Postoperative changes in the mediastinum. Normal heart size and pulmonary vascularity. No focal airspace disease or consolidation. No blunting of costophrenic angles. No pneumothorax. Mild scarring in the left mid lung. Calcification of the aorta. No change since prior study.  EKG:NSR, rate 90 LVH with repolarization changes evident but more pronounced (likely rate related ) than previous.  ASSESSMENT AND PLAN:   CHEST PAIN:  She does have back pain which is likely nonanginal.   She might be having some anginal chest pain and has a very mild troponin elevation.  This is likely demand ischemia related to her anemia and GI bleed.  At this point she needs transfusion as is being done and work up of the anemia.   We will follow her enzyme change.  However, I am not anticipating any further ischemia work up.  She can continue her previous out patient cardiac meds   TOBACCO ABUSE:  She needs to continue to be educated about this.   HTN:  Continue previous meds.   COARCTATION OF THE AORTA:  This was discovered during her bypass surgery in 2007. I see previous notes that it was thought to be small enough not to need correction and likely never caused problem.  I did review that operative note.  This was a stenosis beyond the left subclavian.   CAROTID STENOSIS:  She has moderate bilateral stenosis that is followed by Dr. Harl Bowie.     SignedMinus Breeding 07/21/2014, 10:42 AM

## 2014-07-21 NOTE — ED Notes (Signed)
ekg given to Dr. Roxanne Mins, portable xray at the bedside.

## 2014-07-21 NOTE — Progress Notes (Signed)
Retro ur review 

## 2014-07-22 ENCOUNTER — Encounter (HOSPITAL_COMMUNITY): Payer: Self-pay | Admitting: *Deleted

## 2014-07-22 ENCOUNTER — Encounter (HOSPITAL_COMMUNITY)
Admission: EM | Disposition: A | Discharge status: Home / Self Care | Payer: Self-pay | Source: Home / Self Care | Attending: Internal Medicine

## 2014-07-22 ENCOUNTER — Inpatient Hospital Stay (HOSPITAL_COMMUNITY): Payer: Medicaid Other | Admitting: Anesthesiology

## 2014-07-22 DIAGNOSIS — R778 Other specified abnormalities of plasma proteins: Secondary | ICD-10-CM | POA: Insufficient documentation

## 2014-07-22 DIAGNOSIS — R7989 Other specified abnormal findings of blood chemistry: Secondary | ICD-10-CM

## 2014-07-22 DIAGNOSIS — K250 Acute gastric ulcer with hemorrhage: Secondary | ICD-10-CM | POA: Insufficient documentation

## 2014-07-22 DIAGNOSIS — R8290 Unspecified abnormal findings in urine: Secondary | ICD-10-CM

## 2014-07-22 DIAGNOSIS — I2510 Atherosclerotic heart disease of native coronary artery without angina pectoris: Secondary | ICD-10-CM

## 2014-07-22 HISTORY — PX: ESOPHAGOGASTRODUODENOSCOPY: SHX5428

## 2014-07-22 LAB — TROPONIN I
Troponin I: 1.79 ng/mL (ref <0.031)
Troponin I: 1.19 ng/mL (ref <0.031)

## 2014-07-22 LAB — TYPE AND SCREEN
ABO/RH(D): A POS
ANTIBODY SCREEN: NEGATIVE
UNIT DIVISION: 0
Unit division: 0

## 2014-07-22 LAB — CBC
HCT: 26.3 % — ABNORMAL LOW (ref 36.0–46.0)
HCT: 28.3 % — ABNORMAL LOW (ref 36.0–46.0)
HCT: 28.9 % — ABNORMAL LOW (ref 36.0–46.0)
HEMOGLOBIN: 8.9 g/dL — AB (ref 12.0–15.0)
HEMOGLOBIN: 9.3 g/dL — AB (ref 12.0–15.0)
HEMOGLOBIN: 9.4 g/dL — AB (ref 12.0–15.0)
MCH: 30.3 pg (ref 26.0–34.0)
MCH: 30.2 pg (ref 26.0–34.0)
MCH: 29.7 pg (ref 26.0–34.0)
MCHC: 33.8 g/dL (ref 30.0–36.0)
MCHC: 32.9 g/dL (ref 30.0–36.0)
MCHC: 32.5 g/dL (ref 30.0–36.0)
MCV: 89.5 fL (ref 78.0–100.0)
MCV: 91.9 fL (ref 78.0–100.0)
MCV: 91.5 fL (ref 78.0–100.0)
PLATELETS: 94 10*3/uL — AB (ref 150–400)
Platelets: 94 10*3/uL — ABNORMAL LOW (ref 150–400)
Platelets: 110 10*3/uL — ABNORMAL LOW (ref 150–400)
RBC: 2.94 MIL/uL — ABNORMAL LOW (ref 3.87–5.11)
RBC: 3.08 MIL/uL — AB (ref 3.87–5.11)
RBC: 3.16 MIL/uL — ABNORMAL LOW (ref 3.87–5.11)
RDW: 16.4 % — ABNORMAL HIGH (ref 11.5–15.5)
RDW: 16.6 % — ABNORMAL HIGH (ref 11.5–15.5)
RDW: 16.4 % — AB (ref 11.5–15.5)
WBC: 7.3 10*3/uL (ref 4.0–10.5)
WBC: 7.5 10*3/uL (ref 4.0–10.5)
WBC: 8.6 10*3/uL (ref 4.0–10.5)

## 2014-07-22 LAB — HIV ANTIBODY (ROUTINE TESTING W REFLEX): HIV Screen 4th Generation wRfx: NONREACTIVE

## 2014-07-22 SURGERY — EGD (ESOPHAGOGASTRODUODENOSCOPY)
Anesthesia: Monitor Anesthesia Care

## 2014-07-22 MED ORDER — SODIUM CHLORIDE 0.9 % IV SOLN
INTRAVENOUS | Status: DC
  Administered 2014-07-22: 08:00:00 via INTRAVENOUS

## 2014-07-22 MED ORDER — ASPIRIN EC 81 MG PO TBEC
81.0000 mg | DELAYED_RELEASE_TABLET | Freq: Every day | ORAL | Status: DC
  Administered 2014-07-23: 81 mg via ORAL
  Filled 2014-07-22: qty 1

## 2014-07-22 MED ORDER — PROPOFOL 10 MG/ML IV BOLUS
INTRAVENOUS | Status: DC | PRN
  Administered 2014-07-22: 20 mg via INTRAVENOUS
  Administered 2014-07-22: 10 mg via INTRAVENOUS
  Administered 2014-07-22: 20 mg via INTRAVENOUS

## 2014-07-22 MED ORDER — FENTANYL CITRATE (PF) 100 MCG/2ML IJ SOLN
INTRAMUSCULAR | Status: DC | PRN
Start: 2014-07-22 — End: 2014-07-22
  Administered 2014-07-22: 75 ug via INTRAVENOUS
  Administered 2014-07-22: 50 ug via INTRAVENOUS

## 2014-07-22 MED ORDER — PANTOPRAZOLE SODIUM 40 MG PO TBEC
40.0000 mg | DELAYED_RELEASE_TABLET | Freq: Every day | ORAL | Status: DC
  Administered 2014-07-23: 40 mg via ORAL
  Filled 2014-07-22: qty 1

## 2014-07-22 MED ORDER — MIDAZOLAM HCL 5 MG/5ML IJ SOLN
INTRAMUSCULAR | Status: DC | PRN
  Administered 2014-07-22: 1 mg via INTRAVENOUS

## 2014-07-22 MED ORDER — MORPHINE SULFATE 2 MG/ML IJ SOLN: 1.0000 mg | INTRAMUSCULAR | Status: DC | PRN

## 2014-07-22 MED ORDER — LACTATED RINGERS IV SOLN
INTRAVENOUS | Status: DC
  Administered 2014-07-22: 1000 mL via INTRAVENOUS

## 2014-07-22 MED ORDER — MIDAZOLAM HCL 2 MG/2ML IJ SOLN: 0.5000 mg | Freq: Once | INTRAMUSCULAR | Status: DC | PRN

## 2014-07-22 MED ORDER — FENTANYL CITRATE (PF) 100 MCG/2ML IJ SOLN: 25.0000 ug | INTRAMUSCULAR | Status: DC | PRN

## 2014-07-22 MED ORDER — LACTATED RINGERS IV SOLN
INTRAVENOUS | Status: DC | PRN
  Administered 2014-07-22: 09:00:00 via INTRAVENOUS

## 2014-07-22 MED ORDER — MEPERIDINE HCL 25 MG/ML IJ SOLN: 6.2500 mg | INTRAMUSCULAR | Status: DC | PRN

## 2014-07-22 MED ORDER — PROMETHAZINE HCL 25 MG/ML IJ SOLN: 6.2500 mg | INTRAMUSCULAR | Status: DC | PRN

## 2014-07-22 MED ORDER — ONDANSETRON HCL 4 MG/2ML IJ SOLN
INTRAMUSCULAR | Status: DC | PRN
  Administered 2014-07-22: 4 mg via INTRAVENOUS

## 2014-07-22 MED ORDER — BUTAMBEN-TETRACAINE-BENZOCAINE 2-2-14 % EX AERO
INHALATION_SPRAY | CUTANEOUS | Status: DC | PRN
  Administered 2014-07-22: 2 via TOPICAL

## 2014-07-22 MED ORDER — LIDOCAINE HCL (CARDIAC) 20 MG/ML IV SOLN
INTRAVENOUS | Status: DC | PRN
  Administered 2014-07-22: 60 mg via INTRAVENOUS

## 2014-07-22 NOTE — Progress Notes (Signed)
CRITICAL VALUE ALERT  Critical value received:  Troponin i 1.79  Date of notification:  07/22/14  Time of notification:  7262  Critical value read back:Yes.    Nurse who received alert:  Gerline Legacy, Rn  MD notified (1st page):  J. Desa Rech  Time of first page:  53  MD notified (2nd page):  Time of second page:  Responding MD:    Time MD responded:     Noted per Dr. Janina Mayo, MD [facility] For Consults/Admissions - Flow Manager - [phone] Office  [phone]  Contact MD directly via text page:      [email or web address]      password [email or web address]

## 2014-07-22 NOTE — Anesthesia Preprocedure Evaluation (Signed)
Anesthesia Evaluation  Patient identified by MRN, date of birth, ID band Patient awake    Reviewed: Allergy & Precautions, NPO status , Patient's Chart, lab work & pertinent test results  History of Anesthesia Complications Negative for: history of anesthetic complications  Airway Mallampati: I  TM Distance: >3 FB Neck ROM: Full    Dental  (+) Dental Advisory Given   Pulmonary shortness of breath, COPDCurrent Smoker,  breath sounds clear to auscultation        Cardiovascular hypertension, Pt. on medications and Pt. on home beta blockers + angina with exertion + CAD, + CABG (2007) and + Peripheral Vascular Disease (L subclavian stenosi) Rhythm:Regular Rate:Normal  07/21/14 ECHO: EF 55-60%, valves Ok   Neuro/Psych negative neurological ROS     GI/Hepatic Neg liver ROS, GERD-  Medicated and Poorly Controlled,  Endo/Other  Morbid obesity  Renal/GU Renal InsufficiencyRenal disease (creat 1.32)     Musculoskeletal   Abdominal (+) + obese,   Peds  Hematology  (+) Blood dyscrasia (Hb 8.9), ,   Anesthesia Other Findings   Reproductive/Obstetrics                             Anesthesia Physical Anesthesia Plan  ASA: III  Anesthesia Plan: MAC   Post-op Pain Management:    Induction: Intravenous  Airway Management Planned: Simple Face Mask and Natural Airway  Additional Equipment:   Intra-op Plan:   Post-operative Plan:   Informed Consent: I have reviewed the patients History and Physical, chart, labs and discussed the procedure including the risks, benefits and alternatives for the proposed anesthesia with the patient or authorized representative who has indicated his/her understanding and acceptance.     Plan Discussed with: CRNA and Surgeon  Anesthesia Plan Comments: (Plan routine monitors, MAC)        Anesthesia Quick Evaluation

## 2014-07-22 NOTE — Progress Notes (Signed)
[facility] TEAM 1 - Stepdown/ICU TEAM Progress Note  Christina Boyle YTK:[id number] DOB: August 20, 1955 DOA: 07/21/2014 PCP: Alonza Bogus, MD  Admit HPI / Brief Narrative: 59 year old female with history of CAD and CABG in 2007, hypertension, dyslipidemia, ongoing tobacco abuse, and known carotid bruit who presented to the ER with complaints of diarrhea for 2-3 days associated with dizziness. She also noted left anterior, nonradiating chest pain associated with cold sweats and shortness of breath during the same period of time. She had been taking regular doses of NSAIDs for 1-2 weeks for increased back pain. She had an intermediate risk stress test November 2015 w/ a normal ejection fraction of 60%.   In the ER patient had increased downsloping ST segments in the inferolateral leads compared to previous EKG but her initial troponin was negative. She was fecal occult blood positive.   HPI/Subjective: The patient is resting comfortably in bed post EGD.  She has no complaints at the present time.  She denies chest pain.  She denies abdominal pain shortness of breath fevers chills or nausea/vomiting.  She is hungry.  Assessment/Plan:  Chest pain with history of CAD/CABG - mildly elevated troponin Cardiology following - pain not typical for angina - mildly elevated troponin likely demand ischemia - follow trend until clearly peaked/declining  UGI bleed - antral gastric ulcers GI following - daily PPI - low-dose aspirin okay without other NSAIDs - GI to follow up biopsy results - iron 325 mg twice a day - advance diet as tolerated  Symptomatic acute blood loss anemia Status post 2 units PRBC thus far - hemoglobin improved greater than 10 initially with drop over last 12 hours which may simply be due to equilibration but could be due to ongoing blood loss - we will need to continue to follow until proven stable prior to clear for discharge home  Hypertension No change in setting of acute blood  loss  Acute renal failure Baseline creatinine 0.9 - creatinine at presentation 1.3 - recheck renal function to assure stable prior to discharge  Abnormal urinalysis Follow-up urine culture - no antibiotics for now  Hyperlipidemia Continue Crestor  COPD with ongoing tobacco abuse Counseled on absolute need to discontinue smoking - well compensated at present  Coarctation of aorta Evaluated at time of CABG in 2007 and not felt to require intervention  Moderate bilateral carotid stenosis Followed in serial fashion in the outpatient setting  Code Status: FULL Family Communication: no family present at time of exam Disposition Plan: SDU  Consultants: [facility] cardiology [facility]  Procedures: TTE 5/12 - EGD 5/13 - 2 clean-based antral ulcers with irregular borders and no bleeding - biopsies taken - otherwise normal  Antibiotics: None  DVT prophylaxis: SCDs  Objective: Blood pressure 106/64, pulse 70, temperature 98.4 F (36.9 C), temperature source Axillary, resp. rate 19, height 5\' 2"  (1.575 m), weight 94 kg (207 lb 3.7 oz), SpO2 96 %.  Intake/Output Summary (Last 24 hours) at 07/22/14 1409 Last data filed at 07/22/14 0948  Gross per 24 hour  Intake 1204.17 ml  Output   1950 ml  Net -745.83 ml   Exam: General: No acute respiratory distress Lungs: Clear to auscultation bilaterally without wheezes or crackles Cardiovascular: Regular rate and rhythm without murmur gallop or rub normal S1 and S2 Abdomen: Nontender, nondistended, soft, bowel sounds positive, no rebound, no ascites, no appreciable mass Extremities: No significant cyanosis, clubbing, or edema bilateral lower extremities  Data Reviewed: Basic Metabolic Panel:  Recent Labs Lab 07/21/14  0123  NA 140  K 4.2  CL 110  CO2 22  GLUCOSE 127*  BUN 68*  CREATININE 1.32*  CALCIUM 8.9   CBC:  Recent Labs Lab 07/21/14 0123 07/21/14 1730 07/22/14 0226  WBC 11.9* 9.0 7.3  NEUTROABS 8.5*  --   --     HGB 7.6* 10.3* 8.9*  HCT 23.0* 30.7* 26.3*  MCV 94.3 90.3 89.5  PLT 142* 108* 94*   Coags:  Recent Labs Lab 07/21/14 0947  INR 1.21   Cardiac Enzymes:  Recent Labs Lab 07/21/14 0740 07/22/14 1140  TROPONINI 0.07* 1.79*    Recent Results (from the past 240 hour(s))  Urine culture     Status: None (Preliminary result)   Collection Time: 07/21/14  3:33 AM  Result Value Ref Range Status   Specimen Description URINE, RANDOM  Final   Special Requests NONE  Final   Culture   Final    Culture reincubated for better growth Performed at [facility]    Report Status PENDING  Incomplete  MRSA PCR Screening     Status: None   Collection Time: 07/21/14  3:46 PM  Result Value Ref Range Status   MRSA by PCR NEGATIVE NEGATIVE Final    Comment:        The GeneXpert MRSA Assay (FDA approved for NASAL specimens only), is one component of a comprehensive MRSA colonization surveillance program. It is not intended to diagnose MRSA infection nor to guide or monitor treatment for MRSA infections.      Studies:   Recent x-ray studies have been reviewed in detail by the Attending Physician  Scheduled Meds:  Scheduled Meds: . [START ON 07/23/2014] aspirin EC  81 mg Oral Daily  . atorvastatin  80 mg Oral Daily  . cyanocobalamin  1,000 mcg Intramuscular QAC breakfast  . metoprolol tartrate  75 mg Oral BID  .  morphine injection  1 mg Intravenous Once  . nitroGLYCERIN  0.5 inch Topical 4 times per day  . [START ON 07/23/2014] pantoprazole  40 mg Oral QAC breakfast  . pneumococcal 23 valent vaccine  0.5 mL Intramuscular Tomorrow-1000  . Tiotropium Bromide-Olodaterol  1 puff Inhalation Daily    Time spent on care of this patient: 35 mins   Kendarious Gudino T , MD   [facility] Office  [phone] Pager - Text Page per Shea Evans as per below:  On-Call/Text Page:      [email or web address]      password [email or web address]  If 7PM-7AM, please contact  night-coverage [email or web address] Password [email or web address] 07/22/2014, 2:09 PM   LOS: 1 day

## 2014-07-22 NOTE — Transfer of Care (Signed)
Immediate Anesthesia Transfer of Care Note  Patient: Christina Boyle  Procedure(s) Performed: Procedure(s): ESOPHAGOGASTRODUODENOSCOPY (EGD) (N/A)  Patient Location: Endoscopy Unit  Anesthesia Type:MAC  Level of Consciousness: awake, alert , oriented and patient cooperative  Airway & Oxygen Therapy: Patient Spontanous Breathing and Patient connected to nasal cannula oxygen  Post-op Assessment: Report given to RN, Post -op Vital signs reviewed and stable and Patient moving all extremities  Post vital signs: Reviewed and stable  Last Vitals:  Filed Vitals:   07/22/14 0955  BP: 117/43  Pulse: 77  Temp: 36.6 C  Resp:     Complications: No apparent anesthesia complications

## 2014-07-22 NOTE — Progress Notes (Signed)
Pt came back from Endo this am with no complaints. Pt has been resting with husband at bedside. Pt was given a heart healthy diet and has had some snacks and waiting for lunch. Nurse Marga Hoots is assuming care at this time.

## 2014-07-22 NOTE — Progress Notes (Signed)
PROGRESS NOTE  Primary cardiologist : Branch  Subjective:   59 yo with hx of CAD, CABG, obesity, hyperlipidemia, ongoing cigarette smoking.  Admitted with GI bleed and acute blood loss anemia. Was found to have a minimally elevated troponin elevation.  Has not had any chest pain .    Objective:    Vital Signs:   Temp:  [97.6 F (36.4 C)-98.7 F (37.1 C)] 97.6 F (36.4 C) (05/13 0715) Pulse Rate:  [71-98] 74 (05/13 0715) Resp:  [15-25] 19 (05/13 0715) BP: (109-174)/(33-76) 146/54 mmHg (05/13 0715) SpO2:  [96 %-100 %] 96 % (05/13 0715) Weight:  [94 kg (207 lb 3.7 oz)] 94 kg (207 lb 3.7 oz) (05/12 1525)  Last BM Date: 07/20/14   24-hour weight change: Weight change: 0.105 kg (3.7 oz)  Weight trends: Filed Weights   07/21/14 0108 07/21/14 1525  Weight: 93.895 kg (207 lb) 94 kg (207 lb 3.7 oz)    Intake/Output:  05/12 0701 - 05/13 0700 In: 1269 [I.V.:404.2; Blood:864.8] Out: 1350 [Urine:1350] Total I/O In: -  Out: 600 [Urine:600]   Physical Exam: BP 146/54 mmHg  Pulse 74  Temp(Src) 97.6 F (36.4 C) (Oral)  Resp 19  Ht 5\' 2"  (1.575 m)  Wt 94 kg (207 lb 3.7 oz)  BMI 37.89 kg/m2  SpO2 96%  Wt Readings from Last 3 Encounters:  07/21/14 94 kg (207 lb 3.7 oz)  06/23/14 93.441 kg (206 lb)  03/25/14 96.979 kg (213 lb 12.8 oz)    General: Vital signs reviewed and noted.   Head: Normocephalic, atraumatic.  Eyes: conjunctivae/corneas clear.  EOM's intact.   Throat: normal  Neck:  normal   Lungs:    clear   Heart:   RR   Abdomen:  Soft, non-tender, non-distended    Extremities: No edema    Neurologic: A&O X3, CN II - XII are grossly intact.   Psych: Normal     Labs: BMET:  Recent Labs  07/21/14 0123  NA 140  K 4.2  CL 110  CO2 22  GLUCOSE 127*  BUN 68*  CREATININE 1.32*  CALCIUM 8.9    Liver function tests: No results for input(s): AST, ALT, ALKPHOS, BILITOT, PROT, ALBUMIN in the last 72 hours. No results for input(s): LIPASE, AMYLASE  in the last 72 hours.  CBC:  Recent Labs  07/21/14 0123 07/21/14 1730 07/22/14 0226  WBC 11.9* 9.0 7.3  NEUTROABS 8.5*  --   --   HGB 7.6* 10.3* 8.9*  HCT 23.0* 30.7* 26.3*  MCV 94.3 90.3 89.5  PLT 142* 108* 94*    Cardiac Enzymes:  Recent Labs  07/21/14 0740  TROPONINI 0.07*    Coagulation Studies:  Recent Labs  07/21/14 0947  LABPROT 15.5*  INR 1.21    Other: Invalid input(s): POCBNP No results for input(s): DDIMER in the last 72 hours. No results for input(s): HGBA1C in the last 72 hours. No results for input(s): CHOL, HDL, LDLCALC, TRIG, CHOLHDL in the last 72 hours. No results for input(s): TSH, T4TOTAL, T3FREE, THYROIDAB in the last 72 hours.  Invalid input(s): FREET3  Recent Labs  07/21/14 0740 07/21/14 0748  VITAMINB12 105*  --   FOLATE  --  8.2  FERRITIN 36  --   TIBC 360  --   IRON 85  --   RETICCTPCT 3.7*  --      Other results:  EKG  ( personally reviewed )  -   NSR, TWI inf. And lat.  Slightly more pronounced compared to last year   Medications:    Infusions: . sodium chloride 10 mL/hr at 07/21/14 0802  . sodium chloride 20 mL/hr at 07/22/14 0810    Scheduled Medications: . aspirin EC  325 mg Oral Daily  . atorvastatin  80 mg Oral Daily  . cyanocobalamin  1,000 mcg Intramuscular QAC breakfast  . metoprolol tartrate  75 mg Oral BID  .  morphine injection  1 mg Intravenous Once  . nitroGLYCERIN  0.5 inch Topical 4 times per day  . pantoprazole (PROTONIX) IV  40 mg Intravenous Q12H  . pneumococcal 23 valent vaccine  0.5 mL Intramuscular Tomorrow-1000  . Tiotropium Bromide-Olodaterol  1 puff Inhalation Daily    Assessment/ Plan:   Principal Problem:   Chest pain Active Problems:   Hyperlipemia   TOBACCO ABUSE   COARCTATION OF AORTA   Hypertension   Upper gastrointestinal bleed   Symptomatic anemia   Acute renal failure   Azotemia   Abnormal urinalysis   COPD (chronic obstructive pulmonary disease)   Acute blood loss  anemia   1.  Elevated troponin:  I agree with Dr. Percival Spanish that this is most likely related to demand ischemia from her GI bleed and acute anemia.  Will get another troponin level. Will recheck ECG I do not think she needs any further testing at this point.  Given her lack of cardiac symptoms I would not do a cath  She would not be a  candidate for PCI in the near future because of this GI bleed.  Encouraged her to stop smoking    Disposition:  Length of Stay: 1  Thayer Headings, Brooke Bonito., MD, [facility] 07/22/2014, 8:19 AM Office [phone] Pager [phone]

## 2014-07-22 NOTE — Progress Notes (Signed)
Home med at bedside PT took mid morning

## 2014-07-22 NOTE — Anesthesia Postprocedure Evaluation (Signed)
  Anesthesia Post-op Note  Patient: Christina Boyle  Procedure(s) Performed: Procedure(s): ESOPHAGOGASTRODUODENOSCOPY (EGD) (N/A)  Patient Location: Endoscopy Unit  Anesthesia Type:MAC  Level of Consciousness: awake, alert , oriented and patient cooperative  Airway and Oxygen Therapy: Patient Spontanous Breathing  Post-op Pain: none  Post-op Assessment: Post-op Vital signs reviewed, Patient's Cardiovascular Status Stable, Respiratory Function Stable, Patent Airway, No signs of Nausea or vomiting and Pain level controlled  Post-op Vital Signs: Reviewed and stable  Last Vitals:  Filed Vitals:   07/22/14 1010  BP: 163/45  Pulse: 75  Temp:   Resp: 13    Complications: No apparent anesthesia complications

## 2014-07-22 NOTE — Op Note (Signed)
[facility] [place], [id number]   ENDOSCOPY PROCEDURE REPORT  PATIENT: Christina, Boyle  MR#: [id number] BIRTHDATE: August 28, 1955 , 59  yrs. old GENDER: female ENDOSCOPIST: [name], MD, [facility] REFERRED BY: PROCEDURE DATE:  07/22/2014 PROCEDURE:  EGD w/ biopsy ASA CLASS:     Class III INDICATIONS:  melena. MEDICATIONS: Per Anesthesia and Monitored anesthesia care TOPICAL ANESTHETIC: none  DESCRIPTION OF PROCEDURE: After the risks benefits and alternatives of the procedure were thoroughly explained, informed consent was obtained.  The Pentax Gastroscope [id number] endoscope was introduced through the mouth and advanced to the second portion of the duodenum , Without limitations.  The instrument was slowly withdrawn as the mucosa was fully examined.    1) Two clean-based antral ulcers with irregular borders and no bleeding.  In mid antrum on greater curve and posterior aspect. About 12 and 18 mm sizes.  Biopsies taken. 2) Otherwise normal EGD. Retroflexed views revealed no abnormalities.     The scope was then withdrawn from the patient and the procedure completed.  COMPLICATIONS: There were no immediate complications.  ENDOSCOPIC IMPRESSION: 1) Two clean-based antral ulcers with irregular borders and no bleeding.  In mid antrum on greater curve and posterior aspect. About 12 and 18 mm sizes.  Biopsies taken. 2) Otherwise normal EGD  RECOMMENDATIONS: 1) daily PPI 2) 81 mg ASA ok 3) No other NSAID 4) feed 5) home per hospitalists today ok by me 6) I will f/u bx and arrange GI f/u 7) Fe SO4 325 mg 2x daily   eSigned:  [name], MD, [facility] 07/22/2014 10:02 AM    CC: Velvet Bathe, MD The Patient

## 2014-07-23 DIAGNOSIS — R072 Precordial pain: Secondary | ICD-10-CM

## 2014-07-23 LAB — RENAL FUNCTION PANEL
ALBUMIN: 3.2 g/dL — AB (ref 3.5–5.0)
Anion gap: 6 (ref 5–15)
BUN: 15 mg/dL (ref 6–20)
CHLORIDE: 111 mmol/L (ref 101–111)
CO2: 20 mmol/L — ABNORMAL LOW (ref 22–32)
CREATININE: 0.83 mg/dL (ref 0.44–1.00)
Calcium: 8.3 mg/dL — ABNORMAL LOW (ref 8.9–10.3)
GFR calc Af Amer: >60 mL/min (ref >60)
GFR calc non Af Amer: >60 mL/min (ref >60)
GLUCOSE: 105 mg/dL — AB (ref 65–99)
PHOSPHORUS: 2.4 mg/dL — AB (ref 2.5–4.6)
Potassium: 3.7 mmol/L (ref 3.5–5.1)
Sodium: 137 mmol/L (ref 135–145)

## 2014-07-23 LAB — URINE CULTURE: Colony Count: 75000

## 2014-07-23 LAB — TROPONIN I
TROPONIN I: 1.56 ng/mL — AB (ref <0.031)
Troponin I: 1.41 ng/mL (ref <0.031)

## 2014-07-23 MED ORDER — PANTOPRAZOLE SODIUM 40 MG PO TBEC: 40.0000 mg | DELAYED_RELEASE_TABLET | Freq: Every day | ORAL | Status: DC

## 2014-07-23 MED ORDER — CYANOCOBALAMIN 1000 MCG/ML IJ SOLN
1000.0000 ug | Freq: Every day | INTRAMUSCULAR | Status: DC
  Filled 2014-07-23: qty 1

## 2014-07-23 MED ORDER — NITROGLYCERIN 0.4 MG SL SUBL: 0.4000 mg | SUBLINGUAL_TABLET | SUBLINGUAL | Status: DC | PRN

## 2014-07-23 MED ORDER — ASPIRIN 81 MG PO TBEC: 81.0000 mg | DELAYED_RELEASE_TABLET | Freq: Every day | ORAL | Status: AC

## 2014-07-23 MED ORDER — ISOSORBIDE MONONITRATE ER 30 MG PO TB24
30.0000 mg | ORAL_TABLET | Freq: Every day | ORAL | Status: DC
  Administered 2014-07-23: 30 mg via ORAL
  Filled 2014-07-23: qty 1

## 2014-07-23 NOTE — Progress Notes (Signed)
Went over discharge paperwork with pt. No questions. Pt d/c via [place] with husband, brother, and other family members.

## 2014-07-23 NOTE — Discharge Instructions (Signed)
DO NOT TAKE ANY NSAIDs (Advil, Motrin, Ibuprofen, Aleve, etc) - When in doubt ask your Pharmacist if a medication is an NSAID.  Tylenol is ok for aches, pains, and fevers as needed.    STOP SMOKING COMPLETELY  We have lowered the dose of your daily aspirin to 81mg , and added Protonix daily to your medication regimen.  Peptic Ulcer  A peptic ulcer is a sore in the lining of your esophagus (esophageal ulcer), stomach (gastric ulcer), or in the first part of your small intestine (duodenal ulcer). The ulcer causes erosion into the deeper tissue. CAUSES  Normally, the lining of the stomach and the small intestine protects itself from the acid that digests food. The protective lining can be damaged by:  An infection caused by a bacterium called Helicobacter pylori (H. pylori).  Regular use of nonsteroidal anti-inflammatory drugs (NSAIDs), such as ibuprofen or aspirin.  Smoking tobacco. Other risk factors include being older than 73, drinking alcohol excessively, and having a family history of ulcer disease.  SYMPTOMS   Burning pain or gnawing in the area between the chest and the belly button.  Heartburn.  Nausea and vomiting.  Bloating. The pain can be worse on an empty stomach and at night. If the ulcer results in bleeding, it can cause:  Black, tarry stools.  Vomiting of bright red blood.  Vomiting of coffee-ground-looking materials. DIAGNOSIS  A diagnosis is usually made based upon your history and an exam. Other tests and procedures may be performed to find the cause of the ulcer. Finding a cause will help determine the best treatment. Tests and procedures may include:  Blood tests, stool tests, or breath tests to check for the bacterium H. pylori.  An upper gastrointestinal (GI) series of the esophagus, stomach, and small intestine.  An endoscopy to examine the esophagus, stomach, and small intestine.  A biopsy. TREATMENT  Treatment may include:  Eliminating the cause  of the ulcer, such as smoking, NSAIDs, or alcohol.  Medicines to reduce the amount of acid in your digestive tract.  Antibiotic medicines if the ulcer is caused by the H. pylori bacterium.  An upper endoscopy to treat a bleeding ulcer.  Surgery if the bleeding is severe or if the ulcer created a hole somewhere in the digestive system. HOME CARE INSTRUCTIONS   Avoid tobacco, alcohol, and caffeine. Smoking can increase the acid in the stomach, and continued smoking will impair the healing of ulcers.  Avoid foods and drinks that seem to cause discomfort or aggravate your ulcer.  Only take medicines as directed by your caregiver. Do not substitute over-the-counter medicines for prescription medicines without talking to your caregiver.  Keep any follow-up appointments and tests as directed. SEEK MEDICAL CARE IF:   Your do not improve within 7 days of starting treatment.  You have ongoing indigestion or heartburn. SEEK IMMEDIATE MEDICAL CARE IF:   You have sudden, sharp, or persistent abdominal pain.  You have bloody or dark black, tarry stools.  You vomit blood or vomit that looks like coffee grounds.  You become light-headed, weak, or feel faint.  You become sweaty or clammy. MAKE SURE YOU:   Understand these instructions.  Will watch your condition.  Will get help right away if you are not doing well or get worse. Document Released: 02/23/2000 Document Revised: 07/12/2013 Document Reviewed: 09/25/2011 [facility] Patient Information 2015 [place], [place]. This information is not intended to replace advice given to you by your health care provider. Make sure you discuss any  questions you have with your health care provider.   Smoking Cessation Quitting smoking is important to your health and has many advantages. However, it is not always easy to quit since nicotine is a very addictive drug. Oftentimes, people try 3 times or more before being able to quit. This document explains  the best ways for you to prepare to quit smoking. Quitting takes hard work and a lot of effort, but you can do it. ADVANTAGES OF QUITTING SMOKING  You will live longer, feel better, and live better.  Your body will feel the impact of quitting smoking almost immediately.  Within 20 minutes, blood pressure decreases. Your pulse returns to its normal level.  After 8 hours, carbon monoxide levels in the blood return to normal. Your oxygen level increases.  After 24 hours, the chance of having a heart attack starts to decrease. Your breath, hair, and body stop smelling like smoke.  After 48 hours, damaged nerve endings begin to recover. Your sense of taste and smell improve.  After 72 hours, the body is virtually free of nicotine. Your bronchial tubes relax and breathing becomes easier.  After 2 to 12 weeks, lungs can hold more air. Exercise becomes easier and circulation improves.  The risk of having a heart attack, stroke, cancer, or lung disease is greatly reduced.  After 1 year, the risk of coronary heart disease is cut in half.  After 5 years, the risk of stroke falls to the same as a nonsmoker.  After 10 years, the risk of lung cancer is cut in half and the risk of other cancers decreases significantly.  After 15 years, the risk of coronary heart disease drops, usually to the level of a nonsmoker.  If you are pregnant, quitting smoking will improve your chances of having a healthy baby.  The people you live with, especially any children, will be healthier.  You will have extra money to spend on things other than cigarettes. QUESTIONS TO THINK ABOUT BEFORE ATTEMPTING TO QUIT You may want to talk about your answers with your health care provider.  Why do you want to quit?  If you tried to quit in the past, what helped and what did not?  What will be the most difficult situations for you after you quit? How will you plan to handle them?  Who can help you through the tough  times? Your family? Friends? A health care provider?  What pleasures do you get from smoking? What ways can you still get pleasure if you quit? Here are some questions to ask your health care provider:  How can you help me to be successful at quitting?  What medicine do you think would be best for me and how should I take it?  What should I do if I need more help?  What is smoking withdrawal like? How can I get information on withdrawal? GET READY  Set a quit date.  Change your environment by getting rid of all cigarettes, ashtrays, matches, and lighters in your home, car, or work. Do not let people smoke in your home.  Review your past attempts to quit. Think about what worked and what did not. GET SUPPORT AND ENCOURAGEMENT You have a better chance of being successful if you have help. You can get support in many ways.  Tell your family, friends, and coworkers that you are going to quit and need their support. Ask them not to smoke around you.  Get individual, group, or telephone counseling and  support. Programs are available at [employer] and health centers. Call your local health department for information about programs in your area.  Spiritual beliefs and practices may help some smokers quit.  Download a "quit meter" on your computer to keep track of quit statistics, such as how long you have gone without smoking, cigarettes not smoked, and money saved.  Get a self-help book about quitting smoking and staying off tobacco. [place] yourself from urges to smoke. Talk to someone, go for a walk, or occupy your time with a task.  Change your normal routine. Take a different route to work. Drink tea instead of coffee. Eat breakfast in a different place.  Reduce your stress. Take a hot bath, exercise, or read a book.  Plan something enjoyable to do every day. Reward yourself for not smoking.  Explore interactive web-based programs that  specialize in helping you quit. GET MEDICINE AND USE IT CORRECTLY Medicines can help you stop smoking and decrease the urge to smoke. Combining medicine with the above behavioral methods and support can greatly increase your chances of successfully quitting smoking.  Nicotine replacement therapy helps deliver nicotine to your body without the negative effects and risks of smoking. Nicotine replacement therapy includes nicotine gum, lozenges, inhalers, nasal sprays, and skin patches. Some may be available over-the-counter and others require a prescription.  Antidepressant medicine helps people abstain from smoking, but how this works is unknown. This medicine is available by prescription.  Nicotinic receptor partial agonist medicine simulates the effect of nicotine in your brain. This medicine is available by prescription. Ask your health care provider for advice about which medicines to use and how to use them based on your health history. Your health care provider will tell you what side effects to look out for if you choose to be on a medicine or therapy. Carefully read the information on the package. Do not use any other product containing nicotine while using a nicotine replacement product.  RELAPSE OR DIFFICULT SITUATIONS Most relapses occur within the first 3 months after quitting. Do not be discouraged if you start smoking again. Remember, most people try several times before finally quitting. You may have symptoms of withdrawal because your body is used to nicotine. You may crave cigarettes, be irritable, feel very hungry, cough often, get headaches, or have difficulty concentrating. The withdrawal symptoms are only temporary. They are strongest when you first quit, but they will go away within 10-14 days. To reduce the chances of relapse, try to:  Avoid drinking alcohol. Drinking lowers your chances of successfully quitting.  Reduce the amount of caffeine you consume. Once you quit smoking,  the amount of caffeine in your body increases and can give you symptoms, such as a rapid heartbeat, sweating, and anxiety.  Avoid smokers because they can make you want to smoke.  Do not let weight gain distract you. Many smokers will gain weight when they quit, usually less than 10 pounds. Eat a healthy diet and stay active. You can always lose the weight gained after you quit.  Find ways to improve your mood other than smoking. FOR MORE INFORMATION  www.smokefree.gov  Document Released: 02/19/2001 Document Revised: 07/12/2013 Document Reviewed: 06/06/2011 [facility] Patient Information 2015 [place], [place]. This information is not intended to replace advice given to you by your health care provider. Make sure you discuss any questions you have with your health care provider.

## 2014-07-23 NOTE — Discharge Summary (Signed)
DISCHARGE SUMMARY  Christina Boyle  MR#: [id number]  DOB:05-23-1955  Date of Admission: 07/21/2014 Date of Discharge: 07/23/2014  Attending Physician:MCCLUNG,JEFFREY T  Patient's HYW:VPXTGGY,IRSWNI L, MD  Consults: [facility] cardiology [facility]  Disposition: D/C home   Follow-up Appts:     Follow-up Information    Follow up with Christina L, MD. Schedule an appointment as soon as possible for a visit in 1 week.   Specialty:  Pulmonary Disease   Contact information:   [address] [place] [address] [phone]       Follow up with Christina Rusk, MD.   Specialty:  Gastroenterology   Why:  The office will call you to arrange an appointment.  If you do not hear from them within a week, call the office.     Contact information:   [address] [address] [phone]      Tests Needing Follow-up: -CBC is suggested in 5-7 days -B12 level and Fe indices will be indicated in 6-8 weeks  -path results are pending at time of d/c from bx obtained during EGD - GI is to f/u on these results and inform patient  -renal function should be checked in 5-7 days   Discharge Diagnoses: Chest pain with history of CAD/CABG - mildly elevated troponin UGI bleed - antral gastric ulcers Symptomatic acute blood loss anemia Hypertension Acute renal failure Abnormal urinalysis Hyperlipidemia COPD with ongoing tobacco abuse Coarctation of aorta Moderate bilateral carotid stenosis B12 deficiency  Obesity - Body mass index is 37.89 kg/(m^2).  Initial presentation: 59 year old female with history of CAD and CABG in 2007, hypertension, dyslipidemia, ongoing tobacco abuse, and known carotid bruit who presented to the ER with complaints of diarrhea for 2-3 days associated with dizziness. She also noted left anterior, nonradiating chest pain associated with cold sweats and shortness of breath during the same period of time. She had been taking regular  doses of NSAIDs for 1-2 weeks for increased back pain. She had an intermediate risk stress test November 2015 w/ a normal ejection fraction of 60%.   In the ER patient had increased downsloping ST segments in the inferolateral leads compared to previous EKG but her initial troponin was negative. She was fecal occult blood positive, and was found to have a Hgb of 7.6 w/ a MCV of 94.3.  Hospital Course:  Chest pain with history of CAD/CABG - mildly elevated troponin Cardiology evaluated - initial pain not typical for angina - mildly elevated troponin likely demand ischemia per Cardiology - trop declining at time of d/c and pt chest pain free   UGI bleed - antral gastric ulcers GI evaluated - daily PPI suggested - low-dose aspirin okay without other NSAIDs - GI to follow up biopsy results as outpt - advanced to regular diet w/o difficulty   Symptomatic acute blood loss anemia Status post 2 units PRBC this admit - hemoglobin stabilized at ~9.4 - f/u CBC suggested within 7 days - is not Fe deficient by labs, suggesting isolated acute bleeding - will not prescribe Fe at d/c but instead suggest f/u in 6-8 weeks   Vitamin B12 deficiency  Was given one 1034mcg injection while hospitalized - level was quite low at 105 - recheck of B12 should be accomplished in 6-8 weeks   Hypertension BP poorly controlled - ACE/HCTZ initially held due to renal insuff - resume all home meds at time of d/c   Acute renal failure Baseline creatinine 0.9 - creatinine at presentation  1.3 - crt improved to 0.83 at time of d/c w/ volume resuscitation   Abnormal urinalysis urine culture not helpful - UA not compelling - no antibiotics for now  Hyperlipidemia Continue Crestor  COPD with ongoing tobacco abuse Counseled on absolute need to discontinue smoking - well compensated at present  Coarctation of aorta Evaluated at time of CABG in 2007 and not felt to require intervention  Moderate bilateral carotid  stenosis Followed in serial fashion in the outpatient setting  Obesity - Body mass index is 37.89 kg/(m^2).    Medication List    STOP taking these medications        ibuprofen 200 MG tablet  Commonly known as:  ADVIL,MOTRIN      TAKE these medications        aspirin 81 MG EC tablet  Take 1 tablet (81 mg total) by mouth daily.     atorvastatin 80 MG tablet  Commonly known as:  LIPITOR  Take 80 mg by mouth daily.     isosorbide mononitrate 30 MG 24 hr tablet  Commonly known as:  IMDUR  Take 1 tablet (30 mg total) by mouth daily.     lisinopril-hydrochlorothiazide 20-12.5 MG per tablet  Commonly known as:  PRINZIDE,ZESTORETIC  Take 1 tablet by mouth daily.     metoprolol 50 MG tablet  Commonly known as:  LOPRESSOR  Take 1.5 tablets (75 mg total) by mouth 2 (two) times daily.     nitroGLYCERIN 0.4 MG SL tablet  Commonly known as:  NITROSTAT  Place 1 tablet (0.4 mg total) under the tongue every 5 (five) minutes as needed for chest pain.     pantoprazole 40 MG tablet  Commonly known as:  PROTONIX  Take 1 tablet (40 mg total) by mouth daily before breakfast.     Tiotropium Bromide-Olodaterol 2.5-2.5 MCG/ACT Aers  Inhale 1 puff into the lungs daily.        Day of Discharge BP 176/47 mmHg  Pulse 69  Temp(Src) 98.2 F (36.8 C) (Oral)  Resp 12  Ht 5\' 2"  (1.575 m)  Wt 94 kg (207 lb 3.7 oz)  BMI 37.89 kg/m2  SpO2 99%  Physical Exam: General: No acute respiratory distress Lungs: Clear to auscultation bilaterally without wheezes or crackles Cardiovascular: Regular rate and rhythm without murmur gallop or rub Abdomen: Nontender, nondistended, soft, bowel sounds positive, no rebound, no ascites, no appreciable mass Extremities: No significant cyanosis, clubbing, or edema bilateral lower extremities  Basic Metabolic Panel:  Recent Labs Lab 07/21/14 0123 07/23/14 0311  NA 140 137  K 4.2 3.7  CL 110 111  CO2 22 20*  GLUCOSE 127* 105*  BUN 68* 15  CREATININE  1.32* 0.83  CALCIUM 8.9 8.3*  PHOS  --  2.4*    Liver Function Tests:  Recent Labs Lab 07/23/14 0311  ALBUMIN 3.2*   Coags:  Recent Labs Lab 07/21/14 0947  INR 1.21   CBC:  Recent Labs Lab 07/21/14 0123 07/21/14 1730 07/22/14 0226 07/22/14 1959 07/22/14 2302  WBC 11.9* 9.0 7.3 7.5 8.6  NEUTROABS 8.5*  --   --   --   --   HGB 7.6* 10.3* 8.9* 9.3* 9.4*  HCT 23.0* 30.7* 26.3* 28.3* 28.9*  MCV 94.3 90.3 89.5 91.9 91.5  PLT 142* 108* 94* 94* 110*    Cardiac Enzymes:  Recent Labs Lab 07/21/14 0740 07/22/14 1140 07/22/14 1959 07/22/14 2302 07/23/14 0311  TROPONINI 0.07* 1.79* 1.19* 1.56* 1.41*    Recent Results (from  the past 240 hour(s))  Urine culture     Status: None   Collection Time: 07/21/14  3:33 AM  Result Value Ref Range Status   Specimen Description URINE, RANDOM  Final   Special Requests NONE  Final   Colony Count   Final    75,000 COLONIES/ML Performed at [facility]    Culture   Final    Multiple bacterial morphotypes present, none predominant. Suggest appropriate recollection if clinically indicated. Performed at [facility]    Report Status 07/23/2014 FINAL  Final  MRSA PCR Screening     Status: None   Collection Time: 07/21/14  3:46 PM  Result Value Ref Range Status   MRSA by PCR NEGATIVE NEGATIVE Final    Comment:        The GeneXpert MRSA Assay (FDA approved for NASAL specimens only), is one component of a comprehensive MRSA colonization surveillance program. It is not intended to diagnose MRSA infection nor to guide or monitor treatment for MRSA infections.       Time spent in discharge (includes decision making & examination of pt): >30 minutes  07/23/2014, 9:37 AM   [name], MD [facility] Office  [phone] Pager [phone]  On-Call/Text Page:      [email or web address]      password [facility]

## 2014-07-25 ENCOUNTER — Encounter (HOSPITAL_COMMUNITY): Payer: Self-pay | Admitting: Internal Medicine

## 2014-07-28 LAB — METHYLMALONIC ACID(MMA), RND URINE
Creatinine, Urine mg/dL-MMAURN: 4.62 mmol/L (ref 2.38–26.55)
Methylmalonic Acid, Ur: 0.6 mmol/mol{creat} (ref 0.3–1.9)

## 2014-07-30 NOTE — Progress Notes (Signed)
Quick Note:  Ulcer biopsies benign and no H pylori Believe due to NSAID Let her know please and cc path results to Dr. Velvet Bathe PCP Will need OV me July Stay on PPI, 81 mg ASA ok but no other NSAID ______

## 2014-09-22 ENCOUNTER — Ambulatory Visit: Payer: Medicaid Other | Admitting: Internal Medicine

## 2014-10-06 ENCOUNTER — Other Ambulatory Visit: Payer: Self-pay | Admitting: Cardiology

## 2014-10-09 ENCOUNTER — Other Ambulatory Visit: Payer: Self-pay | Admitting: Cardiology

## 2014-12-11 ENCOUNTER — Other Ambulatory Visit: Payer: Self-pay | Admitting: Cardiology

## 2014-12-22 ENCOUNTER — Encounter: Payer: Self-pay | Admitting: Cardiology

## 2014-12-22 ENCOUNTER — Ambulatory Visit (INDEPENDENT_AMBULATORY_CARE_PROVIDER_SITE_OTHER): Payer: Medicaid Other | Admitting: Cardiology

## 2014-12-22 VITALS — BP 197/70 | HR 70 | Ht 62.0 in | Wt 195.0 lb

## 2014-12-22 DIAGNOSIS — I6529 Occlusion and stenosis of unspecified carotid artery: Secondary | ICD-10-CM

## 2014-12-22 DIAGNOSIS — E785 Hyperlipidemia, unspecified: Secondary | ICD-10-CM | POA: Diagnosis not present

## 2014-12-22 DIAGNOSIS — I2510 Atherosclerotic heart disease of native coronary artery without angina pectoris: Secondary | ICD-10-CM

## 2014-12-22 DIAGNOSIS — Z23 Encounter for immunization: Secondary | ICD-10-CM

## 2014-12-22 NOTE — Patient Instructions (Signed)
Your physician wants you to follow-up in: 6 months with Dr. Branch You will receive a reminder letter in the mail two months in advance. If you don't receive a letter, please call our office to schedule the follow-up appointment.  Your physician recommends that you continue on your current medications as directed. Please refer to the Current Medication list given to you today.  Your physician has requested that you have a carotid duplex. This test is an ultrasound of the carotid arteries in your neck. It looks at blood flow through these arteries that supply the brain with blood. Allow one hour for this exam. There are no restrictions or special instructions.  Thank you for choosing [facility]!!    

## 2014-12-22 NOTE — Progress Notes (Signed)
Patient ID: NANA VASTINE, female   DOB: April 14, 1955, 59 y.o.   MRN: [id number]     Clinical Summary Ms. Abraha is a 59 y.o.female seen today for follow up of the following medical problems.   1. CAD  - prior CABG in 2007 3 vessel (LIMA to LAD, SVG to OM, SVG to PDA) - Echo 12/2012 LVEF 60-65%  - 01/2014 exercise MPI: did not reach THR, exercise limited by SOB and chest pain. Converted to [employer]. Apical anterior and anterolateral ischemia, intermediate risk. LVEF 62% - started on imdur 30mg  daily with noted improvement in symptoms  - denies any chest pain, no SOB  2. HTN - does not check at home - compliant with meds, but has not taken yet today.   3. Hyperlipidemia - 06/2014 TC 196 TG 143 HDL 45 LDL 122 - compliant with statin.    4. Mild aortic stenosis - no symptoms Past Medical History  Diagnosis Date  . ASCVD (arteriosclerotic cardiovascular disease) 02/2006    coronary artery bypass graft surgery  . Tobacco abuse   . Chronic bronchitis   . Coarctation of aorta   . Cervical cancer   . Hyperlipidemia   . Hypertension   . Coronary artery disease   . COPD (chronic obstructive pulmonary disease)   . Shortness of breath dyspnea   . Depression   . GERD (gastroesophageal reflux disease)      No Known Allergies   Current Outpatient Prescriptions  Medication Sig Dispense Refill  . aspirin EC 81 MG EC tablet Take 1 tablet (81 mg total) by mouth daily.    Marland Kitchen atorvastatin (LIPITOR) 80 MG tablet Take 80 mg by mouth daily.    . isosorbide mononitrate (IMDUR) 30 MG 24 hr tablet TAKE ONE TABLET BY MOUTH ONCE DAILY **NEW 02/01/2014** 30 tablet 3  . lisinopril-hydrochlorothiazide (PRINZIDE,ZESTORETIC) 20-12.5 MG tablet TAKE ONE TABLET BY MOUTH ONCE DAILY 30 tablet 6  . metoprolol (LOPRESSOR) 50 MG tablet TAKE ONE & ONE-HALF TABLETS BY MOUTH TWICE DAILY -  DOSE  INCREASE  02/22/2014 90 tablet 3  . nitroGLYCERIN (NITROSTAT) 0.4 MG SL tablet Place 1 tablet (0.4 mg total)  under the tongue every 5 (five) minutes as needed for chest pain. 25 tablet 3  . pantoprazole (PROTONIX) 40 MG tablet Take 1 tablet (40 mg total) by mouth daily before breakfast. 30 tablet 0  . Tiotropium Bromide-Olodaterol 2.5-2.5 MCG/ACT AERS Inhale 1 puff into the lungs daily.     No current facility-administered medications for this visit.     Past Surgical History  Procedure Laterality Date  . Left breast biopsy      for benign disease  . Cholecystectomy    . Coronary artery bypass graft  02/2006  . Cervical cone biopsy    . Esophagogastroduodenoscopy N/A 07/22/2014    Procedure: ESOPHAGOGASTRODUODENOSCOPY (EGD);  Surgeon: [name], MD;  Location: [facility] ENDOSCOPY;  Service: Endoscopy;  Laterality: N/A;     No Known Allergies    Family History  Problem Relation Age of Onset  . Heart attack Father 49  . Lung cancer Mother 29     Social History Ms. Blades reports that she quit smoking about 5 months ago. Her smoking use included Cigarettes. She started smoking about 45 years ago. She has a 50 pack-year smoking history. She has never used smokeless tobacco. Ms. Auxier reports that she does not drink alcohol.   Review of Systems CONSTITUTIONAL: No weight loss, fever, chills, weakness or fatigue.  HEENT:  Eyes: No visual loss, blurred vision, double vision or yellow sclerae.No hearing loss, sneezing, congestion, runny nose or sore throat.  SKIN: No rash or itching.  CARDIOVASCULAR: per HPI RESPIRATORY: No shortness of breath, cough or sputum.  GASTROINTESTINAL: No anorexia, nausea, vomiting or diarrhea. No abdominal pain or blood.  GENITOURINARY: No burning on urination, no polyuria NEUROLOGICAL: No headache, dizziness, syncope, paralysis, ataxia, numbness or tingling in the extremities. No change in bowel or bladder control.  MUSCULOSKELETAL: No muscle, back pain, joint pain or stiffness.  LYMPHATICS: No enlarged nodes. No history of splenectomy.  PSYCHIATRIC: No  history of depression or anxiety.  ENDOCRINOLOGIC: No reports of sweating, cold or heat intolerance. No polyuria or polydipsia.  Marland Kitchen   Physical Examination Filed Vitals:   12/22/14 1116  BP: 197/70  Pulse: 70   Filed Vitals:   12/22/14 1111  Height: 5\' 2"  (1.575 m)  Weight: 195 lb (88.451 kg)    Gen: resting comfortably, no acute distress HEENT: no scleral icterus, pupils equal round and reactive, no palptable cervical adenopathy,  CV: RRR, 2/6 systolic murmur RUSB, no JVD.Bilateral carotid bruits Resp: Clear to auscultation bilaterally GI: abdomen is soft, non-tender, non-distended, normal bowel sounds, no hepatosplenomegaly MSK: extremities are warm, no edema.  Skin: warm, no rash Neuro:  no focal deficits Psych: appropriate affect   Diagnostic Studies 12/2012 Echo  LVEF 60-65%, mild to mod LVH, normal diastolic function, mild AS (valve area 1.5 VTI, mean grad 10), RV function mildly reduced   07/2011 Carotid US  1. Bilateral carotid bifurcation and proximal ICA plaque, resulting in less than 50% diameter stenosis. The exam does not exclude plaque ulceration or embolization. Continued surveillance recommended.  02/2006 Cath  FINDINGS:  1. LV: 143/17/26. EF 65% without regional wall motion abnormality.  2. No aortic stenosis or mitral regurgitation.  3. Left main: There is an 80% stenosis of the mid vessel.  4. LAD: Moderate-size vessel giving rise to two small proximal  diagonals and a larger third diagonal. The LAD has heavy  calcification proximally and diffuse mild disease. The third  diagonal has a 70% stenosis proximally. A small medial subdivision  of this has a 99% stenosis.  5. Circumflex: Moderate-sized vessel giving rise to a single  marginal. There is a 70% stenosis in the AV groove portion of the  vessel.  6. RCA: Moderate-size, dominant vessel. There is an 80% ostial  stenosis, a 40% stenosis of the mid vessel, and a 90% stenosis just   before the origin of the PDA.  7. Left subclavian artery: Diffuse 30% stenosis proximally. The LIMA  is widely patent.  8. Aortic coarctation with 30 mmHg translesional gradient.  9. Normal great vessel anatomy. There is mild ostial stenosis of the  left common carotid artery.  IMPRESSION/RECOMMENDATIONS:  1. Severe multivessel coronary disease.  2. Coarctation of aorta.  3. Normal left ventricular systolic function.  Will refer the to cardiac surgery for consideration of coronary artery  bypass grafting and repair of her coarctation.    Jan 2015 PFTs Reason for pulmonary function testing is shortness of breath.  1. Spirometry shows a moderate ventilatory defect with minimal airflow  obstruction.  2. Lung volumes show air trapping.  3. DLCO is normal.  4. Airway resistance is high confirming the presence of airflow  obstruction.  5. No significant bronchodilator improvement.  6. Arterial blood gas shows relative resting hypoxia.  7. This study is consistent with COPD considering the patient's  smoking history.  10/2013 Carotid US IMPRESSION: 1. Slight interval progression of left-sided heterogeneous atherosclerotic plaque which now results in an estimated 50- 69% diameter ICA stenosis. 2. Stable heterogeneous atherosclerotic plaque on the right resulting in less than 50% diameter ICA stenosis. 3. Vertebral arteries are patent with normal antegrade flow.  01/2014 Lexiscan MPI IMPRESSION: 1. Abnormal study. There is evidence of apical anterior/anterolateral ischemia. Borderline TID ratio increased at 1.2 may also be indicative of subendocardial or balanced ischemia.  2. Normal left ventricular wall motion.  3. Left ventricular ejection fraction 62%  4. Intermediate-risk stress test findings*.   07/2014 echo Study Conclusions  - Left ventricle: The cavity size was normal. Wall thickness was increased in a pattern of mild LVH. Systolic  function was normal. The estimated ejection fraction was in the range of 55% to 60%. Wall motion was normal; there were no regional wall motion abnormalities. Doppler parameters are consistent with elevated ventricular end-diastolic filling pressure. - Aortic valve: Valve area (VTI): 1.36 cm^2. Valve area (Vmax): 1.26 cm^2. Valve area (Vmean): 1.29 cm^2. - Mitral valve: There was mild regurgitation. Valve area by continuity equation (using LVOT flow): 2.15 cm^2. - Left atrium: The atrium was mildly dilated. - Atrial septum: No defect or patent foramen ovale was identified.    Assessment and Plan   1. CAD  - intermediate risk Lexiscan, symptoms resolved with intensified medical therapy - continue current meds at this time  2. HTN - elevated in clinic, however she has not taken her meds yet. Looking back this has been a frequent pattern for our visits. A few months ago she came in for nursing bp check after taking her meds and she was at goal. - continue current meds  3. Hyperlipidemia - continues statin  4. Carotid bruits - obtain carotid US   F/u 6 months  [name], M.D.

## 2015-01-26 ENCOUNTER — Other Ambulatory Visit: Payer: Self-pay | Admitting: *Deleted

## 2015-01-26 ENCOUNTER — Other Ambulatory Visit: Payer: Self-pay | Admitting: Cardiology

## 2015-01-26 ENCOUNTER — Ambulatory Visit (INDEPENDENT_AMBULATORY_CARE_PROVIDER_SITE_OTHER): Payer: Medicaid Other

## 2015-01-26 DIAGNOSIS — I6529 Occlusion and stenosis of unspecified carotid artery: Secondary | ICD-10-CM

## 2015-01-26 MED ORDER — NITROGLYCERIN 0.4 MG SL SUBL: 0.4000 mg | SUBLINGUAL_TABLET | SUBLINGUAL | Status: DC | PRN

## 2015-01-27 ENCOUNTER — Telehealth: Payer: Self-pay | Admitting: *Deleted

## 2015-01-27 NOTE — Telephone Encounter (Signed)
-----   Message from [name], MD sent at 01/27/2015  1:26 PM EST ----- Carotid US shows only mild blockaged on both sides, we will contineu to monitor   Zandra Abts MD

## 2015-01-27 NOTE — Telephone Encounter (Signed)
Pt aware, routed to pcp 

## 2015-02-13 ENCOUNTER — Other Ambulatory Visit: Payer: Self-pay | Admitting: Cardiology

## 2015-03-02 ENCOUNTER — Emergency Department (HOSPITAL_COMMUNITY): Payer: Medicaid Other

## 2015-03-02 ENCOUNTER — Encounter (HOSPITAL_COMMUNITY): Payer: Self-pay | Admitting: Emergency Medicine

## 2015-03-02 ENCOUNTER — Emergency Department (HOSPITAL_COMMUNITY)
Admission: EM | Admit: 2015-03-02 | Discharge: 2015-03-02 | Disposition: A | Discharge status: Home / Self Care | Payer: Medicaid Other | Attending: Emergency Medicine | Admitting: Emergency Medicine

## 2015-03-02 DIAGNOSIS — Z8659 Personal history of other mental and behavioral disorders: Secondary | ICD-10-CM | POA: Insufficient documentation

## 2015-03-02 DIAGNOSIS — R011 Cardiac murmur, unspecified: Secondary | ICD-10-CM | POA: Diagnosis not present

## 2015-03-02 DIAGNOSIS — I2510 Atherosclerotic heart disease of native coronary artery without angina pectoris: Secondary | ICD-10-CM | POA: Insufficient documentation

## 2015-03-02 DIAGNOSIS — E785 Hyperlipidemia, unspecified: Secondary | ICD-10-CM | POA: Insufficient documentation

## 2015-03-02 DIAGNOSIS — Z87891 Personal history of nicotine dependence: Secondary | ICD-10-CM | POA: Diagnosis not present

## 2015-03-02 DIAGNOSIS — J441 Chronic obstructive pulmonary disease with (acute) exacerbation: Secondary | ICD-10-CM | POA: Diagnosis not present

## 2015-03-02 DIAGNOSIS — J069 Acute upper respiratory infection, unspecified: Secondary | ICD-10-CM | POA: Diagnosis not present

## 2015-03-02 DIAGNOSIS — Z79899 Other long term (current) drug therapy: Secondary | ICD-10-CM | POA: Diagnosis not present

## 2015-03-02 DIAGNOSIS — K219 Gastro-esophageal reflux disease without esophagitis: Secondary | ICD-10-CM | POA: Diagnosis not present

## 2015-03-02 DIAGNOSIS — Z7982 Long term (current) use of aspirin: Secondary | ICD-10-CM | POA: Insufficient documentation

## 2015-03-02 DIAGNOSIS — M791 Myalgia: Secondary | ICD-10-CM | POA: Diagnosis not present

## 2015-03-02 DIAGNOSIS — R05 Cough: Secondary | ICD-10-CM | POA: Diagnosis present

## 2015-03-02 DIAGNOSIS — I10 Essential (primary) hypertension: Secondary | ICD-10-CM | POA: Diagnosis not present

## 2015-03-02 DIAGNOSIS — Z8541 Personal history of malignant neoplasm of cervix uteri: Secondary | ICD-10-CM | POA: Insufficient documentation

## 2015-03-02 MED ORDER — IPRATROPIUM BROMIDE 0.02 % IN SOLN
0.5000 mg | Freq: Once | RESPIRATORY_TRACT | Status: AC
  Administered 2015-03-02: 0.5 mg via RESPIRATORY_TRACT
  Filled 2015-03-02: qty 2.5

## 2015-03-02 MED ORDER — PREDNISONE 50 MG PO TABS
60.0000 mg | ORAL_TABLET | Freq: Once | ORAL | Status: AC
  Administered 2015-03-02: 60 mg via ORAL
  Filled 2015-03-02: qty 1

## 2015-03-02 MED ORDER — ALBUTEROL SULFATE (2.5 MG/3ML) 0.083% IN NEBU
5.0000 mg | INHALATION_SOLUTION | Freq: Once | RESPIRATORY_TRACT | Status: AC
  Administered 2015-03-02: 5 mg via RESPIRATORY_TRACT
  Filled 2015-03-02: qty 6

## 2015-03-02 MED ORDER — HYDROCOD POLST-CPM POLST ER 10-8 MG/5ML PO SUER
5.0000 mL | Freq: Once | ORAL | Status: AC
  Administered 2015-03-02: 5 mL via ORAL
  Filled 2015-03-02: qty 5

## 2015-03-02 MED ORDER — DOXYCYCLINE HYCLATE 100 MG PO CAPS: 100.0000 mg | ORAL_CAPSULE | Freq: Two times a day (BID) | ORAL | Status: DC

## 2015-03-02 MED ORDER — DOXYCYCLINE HYCLATE 100 MG PO TABS
100.0000 mg | ORAL_TABLET | Freq: Once | ORAL | Status: AC
  Administered 2015-03-02: 100 mg via ORAL
  Filled 2015-03-02: qty 1

## 2015-03-02 MED ORDER — HYDROCODONE-HOMATROPINE 5-1.5 MG/5ML PO SYRP: 5.0000 mL | ORAL_SOLUTION | Freq: Four times a day (QID) | ORAL | Status: DC | PRN

## 2015-03-02 MED ORDER — PREDNISONE 10 MG PO TABS: ORAL_TABLET | ORAL | Status: DC

## 2015-03-02 NOTE — ED Notes (Signed)
Pt states that she has been aching all over with productive cough since last night.  States that sputum is green in color.

## 2015-03-02 NOTE — ED Provider Notes (Signed)
CSN: [id number]     Arrival date & time 03/02/15  [id number] History   First MD Initiated Contact with Patient 03/02/15 [phone]     Chief Complaint  Patient presents with  . Generalized Body Aches     (Consider location/radiation/quality/duration/timing/severity/associated sxs/prior Treatment) Patient is a 59 y.o. female presenting with cough. The history is provided by the patient.  Cough Cough characteristics:  Productive Sputum characteristics:  Green Severity:  Moderate Onset quality:  Gradual Duration:  1 day Timing:  Intermittent Progression:  Worsening Chronicity:  New Smoker: yes   Context: sick contacts and weather changes   Relieved by:  Nothing Worsened by:  Lying down Associated symptoms: chills, myalgias, sinus congestion and wheezing   Risk factors: no recent travel     Past Medical History  Diagnosis Date  . ASCVD (arteriosclerotic cardiovascular disease) 02/2006    coronary artery bypass graft surgery  . Tobacco abuse   . Chronic bronchitis   . Coarctation of aorta   . Cervical cancer ([place])   . Hyperlipidemia   . Hypertension   . Coronary artery disease   . COPD (chronic obstructive pulmonary disease) ([place])   . Shortness of breath dyspnea   . Depression   . GERD (gastroesophageal reflux disease)    Past Surgical History  Procedure Laterality Date  . Left breast biopsy      for benign disease  . Cholecystectomy    . Coronary artery bypass graft  02/2006  . Cervical cone biopsy    . Esophagogastroduodenoscopy N/A 07/22/2014    Procedure: ESOPHAGOGASTRODUODENOSCOPY (EGD);  Surgeon: [name], MD;  Location: [facility] ENDOSCOPY;  Service: Endoscopy;  Laterality: N/A;   Family History  Problem Relation Age of Onset  . Heart attack Father 53  . Lung cancer Mother 45   Social History  Substance Use Topics  . Smoking status: Former Smoker -- 1.00 packs/day for 50 years    Types: Cigarettes    Start date: 05/21/1969  . Smokeless tobacco: Never Used  .  Alcohol Use: No   OB History    No data available     Review of Systems  Constitutional: Positive for chills.  Respiratory: Positive for cough and wheezing.   Musculoskeletal: Positive for myalgias.  All other systems reviewed and are negative.     Allergies  Review of patient's allergies indicates no known allergies.  Home Medications   Prior to Admission medications   Medication Sig Start Date End Date Taking? Authorizing Provider  acetaminophen (TYLENOL) 500 MG tablet Take 1,000 mg by mouth every 6 (six) hours as needed for moderate pain.   Yes Historical Provider, MD  aspirin EC 81 MG EC tablet Take 1 tablet (81 mg total) by mouth daily. 07/23/14  Yes [name], MD  atorvastatin (LIPITOR) 80 MG tablet TAKE ONE TABLET BY MOUTH ONCE DAILY 01/26/15  Yes [name], MD  isosorbide mononitrate (IMDUR) 30 MG 24 hr tablet TAKE ONE TABLET BY MOUTH ONCE DAILY 02/13/15  Yes [name], MD  lisinopril-hydrochlorothiazide (PRINZIDE,ZESTORETIC) 20-12.5 MG tablet TAKE ONE TABLET BY MOUTH ONCE DAILY 12/12/14  Yes [name], MD  metoprolol (LOPRESSOR) 50 MG tablet TAKE ONE & ONE-HALF TABLETS BY MOUTH TWICE DAILY 02/13/15  Yes [name], MD  pantoprazole (PROTONIX) 40 MG tablet Take 1 tablet (40 mg total) by mouth daily before breakfast. 07/23/14  Yes [name], MD  Tiotropium Bromide-Olodaterol 2.5-2.5 MCG/ACT AERS Inhale 1 puff into the lungs daily.  Yes Historical Provider, MD  nitroGLYCERIN (NITROSTAT) 0.4 MG SL tablet Place 1 tablet (0.4 mg total) under the tongue every 5 (five) minutes as needed for chest pain. 01/26/15   [name], MD   BP 195/56 mmHg  Pulse 84  Temp(Src) 99.1 F (37.3 C) (Oral)  Resp 22  Ht 5\' 2"  (1.575 m)  Wt 87.998 kg  BMI 35.47 kg/m2  SpO2 92% Physical Exam  Constitutional: She is oriented to person, place, and time. She appears well-developed and well-nourished.  Non-toxic appearance.  HENT:  Head:  Normocephalic.  Right Ear: Tympanic membrane and external ear normal.  Left Ear: Tympanic membrane and external ear normal.  Eyes: EOM and lids are normal. Pupils are equal, round, and reactive to light.  Neck: Normal range of motion. Neck supple. Carotid bruit is not present.  Cardiovascular: Normal rate, regular rhythm, intact distal pulses and normal pulses.   Murmur heard. 2/6 harsh blowing murmur  Pulmonary/Chest: No respiratory distress. She has wheezes. She has rhonchi.  Abdominal: Soft. Bowel sounds are normal. There is no tenderness. There is no guarding.  Musculoskeletal: Normal range of motion.  Lymphadenopathy:       Head (right side): No submandibular adenopathy present.       Head (left side): No submandibular adenopathy present.    She has no cervical adenopathy.  Neurological: She is alert and oriented to person, place, and time. She has normal strength. No cranial nerve deficit or sensory deficit.  Skin: Skin is warm and dry.  Psychiatric: She has a normal mood and affect. Her speech is normal.  Nursing note and vitals reviewed.   ED Course  Procedures (including critical care time) Labs Review Labs Reviewed - No data to display  Imaging Review Dg Chest 2 View  03/02/2015  CLINICAL DATA:  Productive cough, aching all over since last night. EXAM: CHEST  2 VIEW COMPARISON:  07/21/2014 FINDINGS: Prior CABG. Heart is borderline in size. Mild peribronchial thickening and interstitial prominence. No confluent opacities or effusions. No acute bony abnormality. IMPRESSION: Mild bronchitic changes. Electronically Signed   By: Rolm Baptise M.D.   On: 03/02/2015 09:29   I have personally reviewed and evaluated these images and lab results as part of my medical decision-making.   EKG Interpretation None      MDM  Vital signs reviewed. Chest xray shows bronchitic changes. Pt improved after albuterol and cough medication. Albuterol offered, but pt states she does better  with daily Spiriva medication. Rx for cough medication, doxycycline, and prednisone given to the patient. Pt to see PCP for follow up and recheck.   Final diagnoses:  None    *I have reviewed nursing notes, vital signs, and all appropriate lab and imaging results for this patient.Lily Kocher, PA-C 03/02/15 [place], MD 03/04/15 [phone]

## 2015-03-02 NOTE — Discharge Instructions (Signed)

## 2015-03-02 NOTE — ED Notes (Signed)
Pt states that she is feeling much better after breathing tx.

## 2015-03-21 ENCOUNTER — Emergency Department (HOSPITAL_COMMUNITY)
Admission: EM | Admit: 2015-03-21 | Discharge: 2015-03-21 | Disposition: A | Discharge status: Home / Self Care | Payer: Medicaid Other | Attending: Emergency Medicine | Admitting: Emergency Medicine

## 2015-03-21 ENCOUNTER — Emergency Department (HOSPITAL_COMMUNITY): Payer: Medicaid Other

## 2015-03-21 ENCOUNTER — Encounter (HOSPITAL_COMMUNITY): Payer: Self-pay | Admitting: Emergency Medicine

## 2015-03-21 DIAGNOSIS — I10 Essential (primary) hypertension: Secondary | ICD-10-CM | POA: Insufficient documentation

## 2015-03-21 DIAGNOSIS — Z951 Presence of aortocoronary bypass graft: Secondary | ICD-10-CM | POA: Insufficient documentation

## 2015-03-21 DIAGNOSIS — K219 Gastro-esophageal reflux disease without esophagitis: Secondary | ICD-10-CM | POA: Insufficient documentation

## 2015-03-21 DIAGNOSIS — I2510 Atherosclerotic heart disease of native coronary artery without angina pectoris: Secondary | ICD-10-CM | POA: Insufficient documentation

## 2015-03-21 DIAGNOSIS — Z7982 Long term (current) use of aspirin: Secondary | ICD-10-CM | POA: Insufficient documentation

## 2015-03-21 DIAGNOSIS — J40 Bronchitis, not specified as acute or chronic: Secondary | ICD-10-CM

## 2015-03-21 DIAGNOSIS — Z87891 Personal history of nicotine dependence: Secondary | ICD-10-CM | POA: Insufficient documentation

## 2015-03-21 DIAGNOSIS — Z8541 Personal history of malignant neoplasm of cervix uteri: Secondary | ICD-10-CM | POA: Diagnosis not present

## 2015-03-21 DIAGNOSIS — Z7952 Long term (current) use of systemic steroids: Secondary | ICD-10-CM | POA: Insufficient documentation

## 2015-03-21 DIAGNOSIS — E785 Hyperlipidemia, unspecified: Secondary | ICD-10-CM | POA: Diagnosis not present

## 2015-03-21 DIAGNOSIS — Z9889 Other specified postprocedural states: Secondary | ICD-10-CM | POA: Diagnosis not present

## 2015-03-21 DIAGNOSIS — Z79899 Other long term (current) drug therapy: Secondary | ICD-10-CM | POA: Diagnosis not present

## 2015-03-21 DIAGNOSIS — R05 Cough: Secondary | ICD-10-CM | POA: Diagnosis present

## 2015-03-21 DIAGNOSIS — Z8659 Personal history of other mental and behavioral disorders: Secondary | ICD-10-CM | POA: Insufficient documentation

## 2015-03-21 DIAGNOSIS — J441 Chronic obstructive pulmonary disease with (acute) exacerbation: Secondary | ICD-10-CM | POA: Diagnosis not present

## 2015-03-21 MED ORDER — ALBUTEROL SULFATE HFA 108 (90 BASE) MCG/ACT IN AERS
2.0000 | INHALATION_SPRAY | Freq: Once | RESPIRATORY_TRACT | Status: AC
  Administered 2015-03-21: 2 via RESPIRATORY_TRACT
  Filled 2015-03-21: qty 6.7

## 2015-03-21 MED ORDER — PREDNISONE 20 MG PO TABS: 40.0000 mg | ORAL_TABLET | Freq: Every day | ORAL | Status: DC

## 2015-03-21 MED ORDER — BENZONATATE 100 MG PO CAPS
100.0000 mg | ORAL_CAPSULE | Freq: Three times a day (TID) | ORAL | Status: DC
Start: 2015-03-21 — End: 2015-06-12

## 2015-03-21 NOTE — ED Provider Notes (Signed)
CSN: [id number]     Arrival date & time 03/21/15  1734 History   First MD Initiated Contact with Patient 03/21/15 1847     Chief Complaint  Patient presents with  . Cough     (Consider location/radiation/quality/duration/timing/severity/associated sxs/prior Treatment) HPI Comments: The pt is a 60 y/o female who prsents with ongoing coughing - she has had coughing since 12/20 - improved after taking cough meds / doxycycline - then got worse 4 days ago - she has a spiriva inhaler that Dr. Luan Pulling has Rx for her and she uses it daily - she denies having a rescue inhaler.   Sx are constant, nothing makes better or worse.  No associated fevers.    Patient is a 60 y.o. female presenting with cough. The history is provided by the patient.  Cough   Past Medical History  Diagnosis Date  . ASCVD (arteriosclerotic cardiovascular disease) 02/2006    coronary artery bypass graft surgery  . Tobacco abuse   . Chronic bronchitis   . Coarctation of aorta   . Cervical cancer ([place])   . Hyperlipidemia   . Hypertension   . Coronary artery disease   . COPD (chronic obstructive pulmonary disease) ([place])   . Shortness of breath dyspnea   . Depression   . GERD (gastroesophageal reflux disease)    Past Surgical History  Procedure Laterality Date  . Left breast biopsy      for benign disease  . Cholecystectomy    . Coronary artery bypass graft  02/2006  . Cervical cone biopsy    . Esophagogastroduodenoscopy N/A 07/22/2014    Procedure: ESOPHAGOGASTRODUODENOSCOPY (EGD);  Surgeon: [name], MD;  Location: [facility] ENDOSCOPY;  Service: Endoscopy;  Laterality: N/A;   Family History  Problem Relation Age of Onset  . Heart attack Father 59  . Lung cancer Mother 7   Social History  Substance Use Topics  . Smoking status: Former Smoker -- 1.00 packs/day for 50 years    Types: Cigarettes    Start date: 05/21/1969  . Smokeless tobacco: Never Used  . Alcohol Use: No   OB History    No data  available     Review of Systems  Respiratory: Positive for cough.   All other systems reviewed and are negative.     Allergies  Review of patient's allergies indicates no known allergies.  Home Medications   Prior to Admission medications   Medication Sig Start Date End Date Taking? Authorizing Provider  acetaminophen (TYLENOL) 500 MG tablet Take 1,000 mg by mouth every 6 (six) hours as needed for moderate pain.   Yes Historical Provider, MD  aspirin EC 81 MG EC tablet Take 1 tablet (81 mg total) by mouth daily. 07/23/14  Yes [name], MD  atorvastatin (LIPITOR) 80 MG tablet TAKE ONE TABLET BY MOUTH ONCE DAILY 01/26/15  Yes [name], MD  isosorbide mononitrate (IMDUR) 30 MG 24 hr tablet TAKE ONE TABLET BY MOUTH ONCE DAILY 02/13/15  Yes [name], MD  lisinopril-hydrochlorothiazide (PRINZIDE,ZESTORETIC) 20-12.5 MG tablet TAKE ONE TABLET BY MOUTH ONCE DAILY 12/12/14  Yes [name], MD  metoprolol (LOPRESSOR) 50 MG tablet TAKE ONE & ONE-HALF TABLETS BY MOUTH TWICE DAILY 02/13/15  Yes [name], MD  nitroGLYCERIN (NITROSTAT) 0.4 MG SL tablet Place 1 tablet (0.4 mg total) under the tongue every 5 (five) minutes as needed for chest pain. 01/26/15  Yes [name], MD  Pseudoeph-Doxylamine-DM-APAP Chauncy Passy D COLD/FLU PO) Take 15-30  mLs by mouth daily as needed (for cold symptoms).   Yes Historical Provider, MD  Tiotropium Bromide Monohydrate (SPIRIVA RESPIMAT) 2.5 MCG/ACT AERS Inhale 1 puff into the lungs daily.   Yes Historical Provider, MD  benzonatate (TESSALON) 100 MG capsule Take 1 capsule (100 mg total) by mouth every 8 (eight) hours. 03/21/15   Noemi Chapel, MD  doxycycline (VIBRAMYCIN) 100 MG capsule Take 1 capsule (100 mg total) by mouth 2 (two) times daily. Patient not taking: Reported on 03/21/2015 03/02/15   Lily Kocher, PA-C  HYDROcodone-homatropine [facility]) 5-1.5 MG/5ML syrup Take 5 mLs by mouth every 6 (six) hours as needed. Patient  not taking: Reported on 03/21/2015 03/02/15   Lily Kocher, PA-C  pantoprazole (PROTONIX) 40 MG tablet Take 1 tablet (40 mg total) by mouth daily before breakfast. Patient not taking: Reported on 03/21/2015 07/23/14   [name], MD  predniSONE (DELTASONE) 20 MG tablet Take 2 tablets (40 mg total) by mouth daily. 03/21/15   Noemi Chapel, MD   BP 194/55 mmHg  Pulse 72  Temp(Src) 98.7 F (37.1 C) (Oral)  Resp 20  Ht 5\' 2"  (1.575 m)  Wt 194 lb (87.998 kg)  BMI 35.47 kg/m2  SpO2 96% Physical Exam  Constitutional: She appears well-developed and well-nourished. No distress.  HENT:  Head: Normocephalic and atraumatic.  Mouth/Throat: Oropharynx is clear and moist. No oropharyngeal exudate.  Eyes: Conjunctivae and EOM are normal. Pupils are equal, round, and reactive to light. Right eye exhibits no discharge. Left eye exhibits no discharge. No scleral icterus.  Neck: Normal range of motion. Neck supple. No JVD present. No thyromegaly present.  Cardiovascular: Normal rate, regular rhythm, normal heart sounds and intact distal pulses.  Exam reveals no gallop and no friction rub.   No murmur heard. Pulmonary/Chest: Effort normal. No respiratory distress. She has wheezes (wheezes on forced expiration.). She has no rales.  No rales - speaks in full sentences  Abdominal: Soft. Bowel sounds are normal. She exhibits no distension and no mass. There is no tenderness.  Musculoskeletal: Normal range of motion. She exhibits no edema or tenderness.  Lymphadenopathy:    She has no cervical adenopathy.  Neurological: She is alert. Coordination normal.  Skin: Skin is warm and dry. No rash noted. No erythema.  Psychiatric: She has a normal mood and affect. Her behavior is normal.  Nursing note and vitals reviewed.   ED Course  Procedures (including critical care time) Labs Review Labs Reviewed - No data to display  Imaging Review Dg Chest 2 View  03/21/2015  CLINICAL DATA:  Productive cough for 3  days. EXAM: CHEST  2 VIEW COMPARISON:  03/02/2015 FINDINGS: Patient is post median sternotomy. Cardiomediastinal contours are stable, borderline cardiomegaly is unchanged. Bronchial thickening interstitial prominence is stable from prior exam. Linear atelectasis or scarring in the left midlung zone. No superimposed consolidation. Calcified granuloma at the left lung base. No pleural effusion or pneumothorax. Osseous structures are unchanged. IMPRESSION: Stable bronchitic change, likely chronic. No superimposed acute process. Electronically Signed   By: Jeb Levering M.D.   On: 03/21/2015 18:05   I have personally reviewed and evaluated these images and lab results as part of my medical decision-making.    MDM   Final diagnoses:  Bronchitis    Well appearing, VS with hypertension but no other acute findings - has normal xray without infiltrate  I have personally viewed and interpreted the imaging and agree with radiologist interpretation.  MDI - given here Prednisone Tessalon -  F/u with Dr. Luan Pulling - pt in agreement with plan.  Meds given in ED:  Medications  albuterol (PROVENTIL HFA;VENTOLIN HFA) 108 (90 Base) MCG/ACT inhaler 2 puff (not administered)    New Prescriptions   BENZONATATE (TESSALON) 100 MG CAPSULE    Take 1 capsule (100 mg total) by mouth every 8 (eight) hours.   PREDNISONE (DELTASONE) 20 MG TABLET    Take 2 tablets (40 mg total) by mouth daily.         Noemi Chapel, MD 03/21/15 [phone]

## 2015-03-21 NOTE — ED Notes (Signed)
Pt offered wheelchair but pt refused, ambulated out with steady gait and voiced no complaints or questions at time of d/c

## 2015-03-21 NOTE — ED Notes (Signed)
Pt states that she is not feeling any better after being seen for bronchitis a few weeks ago.  States that she continues to have green sputum and is finished with abx.

## 2015-03-21 NOTE — Discharge Instructions (Signed)

## 2015-06-10 DIAGNOSIS — K284 Chronic or unspecified gastrojejunal ulcer with hemorrhage: Secondary | ICD-10-CM

## 2015-06-10 DIAGNOSIS — K274 Chronic or unspecified peptic ulcer, site unspecified, with hemorrhage: Secondary | ICD-10-CM

## 2015-06-10 HISTORY — DX: Chronic or unspecified peptic ulcer, site unspecified, with hemorrhage: K27.4

## 2015-06-10 HISTORY — DX: Chronic or unspecified gastrojejunal ulcer with hemorrhage: K28.4

## 2015-06-12 ENCOUNTER — Ambulatory Visit (INDEPENDENT_AMBULATORY_CARE_PROVIDER_SITE_OTHER): Payer: Medicaid Other | Admitting: Cardiology

## 2015-06-12 ENCOUNTER — Encounter: Payer: Self-pay | Admitting: Cardiology

## 2015-06-12 VITALS — BP 168/85 | HR 60 | Ht 62.0 in | Wt 195.8 lb

## 2015-06-12 DIAGNOSIS — I2510 Atherosclerotic heart disease of native coronary artery without angina pectoris: Secondary | ICD-10-CM

## 2015-06-12 DIAGNOSIS — I10 Essential (primary) hypertension: Secondary | ICD-10-CM | POA: Diagnosis not present

## 2015-06-12 DIAGNOSIS — R7309 Other abnormal glucose: Secondary | ICD-10-CM | POA: Diagnosis not present

## 2015-06-12 DIAGNOSIS — E785 Hyperlipidemia, unspecified: Secondary | ICD-10-CM | POA: Diagnosis not present

## 2015-06-12 MED ORDER — NICOTINE 7 MG/24HR TD PT24: MEDICATED_PATCH | TRANSDERMAL | Status: DC

## 2015-06-12 MED ORDER — LISINOPRIL-HYDROCHLOROTHIAZIDE 20-12.5 MG PO TABS: ORAL_TABLET | ORAL | Status: DC

## 2015-06-12 MED ORDER — NICOTINE 14 MG/24HR TD PT24: MEDICATED_PATCH | TRANSDERMAL | Status: DC

## 2015-06-12 MED ORDER — NICOTINE 21 MG/24HR TD PT24: MEDICATED_PATCH | TRANSDERMAL | Status: DC

## 2015-06-12 NOTE — Patient Instructions (Signed)
Your physician recommends that you schedule a follow-up appointment in: [place] DR. BRANCH  Your physician has recommended you make the following change in your medication:   TAKE 2 TABLETS DAILY OF PRINZIDE   START NICODERM PATCH 21 MG DAILY FOR 6 WEEKS  14 MG PATCH DAILY FOR 2 WEEKS  7 MG PATCH DAILY FOR 2 WEEKS  Your physician recommends that you return for lab work in: [place] FAST FOR LAB WORK - CBC.TSH.MG.BMP.LIPIDS.HGBA1C  Thank you for choosing [facility]!!

## 2015-06-12 NOTE — Progress Notes (Signed)
Patient ID: TOSHUA BORSA, female   DOB: 10/23/1955, 60 y.o.   MRN: [id number]     Clinical Summary Ms. Rubley is a 60 y.o.female seen today for follow up of the following medical problems.    1. CAD  - prior CABG in 2007 3 vessel (LIMA to LAD, SVG to OM, SVG to PDA) - Echo 12/2012 LVEF 60-65%  - 01/2014 exercise MPI: did not reach THR, exercise limited by SOB and chest pain. Converted to [employer]. Apical anterior and anterolateral ischemia, intermediate risk. LVEF 62% - started on imdur 30mg  daily with noted improvement in symptoms  - denies any chest pain since last visit. No SOB or DOE - compliant with meds  2. HTN - compliant with meds - she does not check her bp at home  3. Hyperlipidemia - 06/2014 TC 196 TG 143 HDL 45 LDL 122 - compliant with statin.   4.Aortic stenosis - echo 07/2014 LVEF 55-60%, mean grad 15 AVA VTI 1.36. Overall mild to moderate AS.  - no chest pain, SOB/DOE, or syncope  5. Carotid stenosis - carotid US 01/2015 with bilateral 1-39% disease. Repeat 01/2017 - no recent neuro symptoms  6. Tobacco abuse - smoking 1/2 ppd. Reports chantix caused nausea/ vomiting. Using electronic cigarettes.    SH: enjoys spending time outside, planting flowers.  Past Medical History  Diagnosis Date  . ASCVD (arteriosclerotic cardiovascular disease) 02/2006    coronary artery bypass graft surgery  . Tobacco abuse   . Chronic bronchitis   . Coarctation of aorta   . Cervical cancer ([place])   . Hyperlipidemia   . Hypertension   . Coronary artery disease   . COPD (chronic obstructive pulmonary disease) ([place])   . Shortness of breath dyspnea   . Depression   . GERD (gastroesophageal reflux disease)      No Known Allergies   Current Outpatient Prescriptions  Medication Sig Dispense Refill  . acetaminophen (TYLENOL) 500 MG tablet Take 1,000 mg by mouth every 6 (six) hours as needed for moderate pain.    Marland Kitchen aspirin EC 81 MG EC tablet Take 1 tablet (81 mg  total) by mouth daily.    Marland Kitchen atorvastatin (LIPITOR) 80 MG tablet TAKE ONE TABLET BY MOUTH ONCE DAILY 90 tablet 3  . benzonatate (TESSALON) 100 MG capsule Take 1 capsule (100 mg total) by mouth every 8 (eight) hours. 21 capsule 0  . doxycycline (VIBRAMYCIN) 100 MG capsule Take 1 capsule (100 mg total) by mouth 2 (two) times daily. (Patient not taking: Reported on 03/21/2015) 14 capsule 0  . HYDROcodone-homatropine (HYCODAN) 5-1.5 MG/5ML syrup Take 5 mLs by mouth every 6 (six) hours as needed. (Patient not taking: Reported on 03/21/2015) 120 mL 0  . isosorbide mononitrate (IMDUR) 30 MG 24 hr tablet TAKE ONE TABLET BY MOUTH ONCE DAILY 30 tablet 6  . lisinopril-hydrochlorothiazide (PRINZIDE,ZESTORETIC) 20-12.5 MG tablet TAKE ONE TABLET BY MOUTH ONCE DAILY 30 tablet 6  . metoprolol (LOPRESSOR) 50 MG tablet TAKE ONE & ONE-HALF TABLETS BY MOUTH TWICE DAILY 90 tablet 6  . nitroGLYCERIN (NITROSTAT) 0.4 MG SL tablet Place 1 tablet (0.4 mg total) under the tongue every 5 (five) minutes as needed for chest pain. 25 tablet 3  . pantoprazole (PROTONIX) 40 MG tablet Take 1 tablet (40 mg total) by mouth daily before breakfast. (Patient not taking: Reported on 03/21/2015) 30 tablet 0  . predniSONE (DELTASONE) 20 MG tablet Take 2 tablets (40 mg total) by mouth daily. 10 tablet 0  . Pseudoeph-Doxylamine-DM-APAP (  NYQUIL D COLD/FLU PO) Take 15-30 mLs by mouth daily as needed (for cold symptoms).    . Tiotropium Bromide Monohydrate (SPIRIVA RESPIMAT) 2.5 MCG/ACT AERS Inhale 1 puff into the lungs daily.     No current facility-administered medications for this visit.     Past Surgical History  Procedure Laterality Date  . Left breast biopsy      for benign disease  . Cholecystectomy    . Coronary artery bypass graft  02/2006  . Cervical cone biopsy    . Esophagogastroduodenoscopy N/A 07/22/2014    Procedure: ESOPHAGOGASTRODUODENOSCOPY (EGD);  Surgeon: [name], MD;  Location: [facility] ENDOSCOPY;  Service: Endoscopy;   Laterality: N/A;     No Known Allergies    Family History  Problem Relation Age of Onset  . Heart attack Father 96  . Lung cancer Mother 30     Social History Ms. Barros reports that she has quit smoking. Her smoking use included Cigarettes. She started smoking about 46 years ago. She has a 50 pack-year smoking history. She has never used smokeless tobacco. Ms. Obanion reports that she does not drink alcohol.   Review of Systems CONSTITUTIONAL: No weight loss, fever, chills, weakness or fatigue.  HEENT: Eyes: No visual loss, blurred vision, double vision or yellow sclerae.No hearing loss, sneezing, congestion, runny nose or sore throat.  SKIN: No rash or itching.  CARDIOVASCULAR: per HPI RESPIRATORY: No shortness of breath, cough or sputum.  GASTROINTESTINAL: No anorexia, nausea, vomiting or diarrhea. No abdominal pain or blood.  GENITOURINARY: No burning on urination, no polyuria NEUROLOGICAL: No headache, dizziness, syncope, paralysis, ataxia, numbness or tingling in the extremities. No change in bowel or bladder control.  MUSCULOSKELETAL: No muscle, back pain, joint pain or stiffness.  LYMPHATICS: No enlarged nodes. No history of splenectomy.  PSYCHIATRIC: No history of depression or anxiety.  ENDOCRINOLOGIC: No reports of sweating, cold or heat intolerance. No polyuria or polydipsia.  Marland Kitchen   Physical Examination Filed Vitals:   06/12/15 1031  BP: 168/85  Pulse: 60   Filed Vitals:   06/12/15 1031  Height: 5\' 2"  (1.575 m)  Weight: 195 lb 12.8 oz (88.814 kg)    Gen: resting comfortably, no acute distress HEENT: no scleral icterus, pupils equal round and reactive, no palptable cervical adenopathy,  CV: RRR, no m/r/g, no jvd Resp: Clear to auscultation bilaterally GI: abdomen is soft, non-tender, non-distended, normal bowel sounds, no hepatosplenomegaly MSK: extremities are warm, no edema.  Skin: warm, no rash Neuro:  no focal deficits Psych: appropriate  affect   Diagnostic Studies  12/2012 Echo  LVEF 60-65%, mild to mod LVH, normal diastolic function, mild AS (valve area 1.5 VTI, mean grad 10), RV function mildly reduced   07/2011 Carotid US  1. Bilateral carotid bifurcation and proximal ICA plaque, resulting in less than 50% diameter stenosis. The exam does not exclude plaque ulceration or embolization. Continued surveillance recommended.  02/2006 Cath  FINDINGS:  1. LV: 143/17/26. EF 65% without regional wall motion abnormality.  2. No aortic stenosis or mitral regurgitation.  3. Left main: There is an 80% stenosis of the mid vessel.  4. LAD: Moderate-size vessel giving rise to two small proximal  diagonals and a larger third diagonal. The LAD has heavy  calcification proximally and diffuse mild disease. The third  diagonal has a 70% stenosis proximally. A small medial subdivision  of this has a 99% stenosis.  5. Circumflex: Moderate-sized vessel giving rise to a single  marginal. There is a  70% stenosis in the AV groove portion of the  vessel.  6. RCA: Moderate-size, dominant vessel. There is an 80% ostial  stenosis, a 40% stenosis of the mid vessel, and a 90% stenosis just  before the origin of the PDA.  7. Left subclavian artery: Diffuse 30% stenosis proximally. The LIMA  is widely patent.  8. Aortic coarctation with 30 mmHg translesional gradient.  9. Normal great vessel anatomy. There is mild ostial stenosis of the  left common carotid artery.  IMPRESSION/RECOMMENDATIONS:  1. Severe multivessel coronary disease.  2. Coarctation of aorta.  3. Normal left ventricular systolic function.  Will refer the to cardiac surgery for consideration of coronary artery  bypass grafting and repair of her coarctation.    Jan 2015 PFTs Reason for pulmonary function testing is shortness of breath.  1. Spirometry shows a moderate ventilatory defect with minimal airflow  obstruction.  2. Lung volumes  show air trapping.  3. DLCO is normal.  4. Airway resistance is high confirming the presence of airflow  obstruction.  5. No significant bronchodilator improvement.  6. Arterial blood gas shows relative resting hypoxia.  7. This study is consistent with COPD considering the patient's  smoking history.   10/2013 Carotid US IMPRESSION: 1. Slight interval progression of left-sided heterogeneous atherosclerotic plaque which now results in an estimated 50- 69% diameter ICA stenosis. 2. Stable heterogeneous atherosclerotic plaque on the right resulting in less than 50% diameter ICA stenosis. 3. Vertebral arteries are patent with normal antegrade flow.  01/2014 Lexiscan MPI IMPRESSION: 1. Abnormal study. There is evidence of apical anterior/anterolateral ischemia. Borderline TID ratio increased at 1.2 may also be indicative of subendocardial or balanced ischemia.  2. Normal left ventricular wall motion.  3. Left ventricular ejection fraction 62%  4. Intermediate-risk stress test findings*.   07/2014 echo Study Conclusions  - Left ventricle: The cavity size was normal. Wall thickness was increased in a pattern of mild LVH. Systolic function was normal. The estimated ejection fraction was in the range of 55% to 60%. Wall motion was normal; there were no regional wall motion abnormalities. Doppler parameters are consistent with elevated ventricular end-diastolic filling pressure. - Aortic valve: Valve area (VTI): 1.36 cm^2. Valve area (Vmax): 1.26 cm^2. Valve area (Vmean): 1.29 cm^2. - Mitral valve: There was mild regurgitation. Valve area by continuity equation (using LVOT flow): 2.15 cm^2. - Left atrium: The atrium was mildly dilated. - Atrial septum: No defect or patent foramen ovale was identified.     Assessment and Plan   1. CAD  - intermediate risk Lexiscan, symptoms resolved with intensified medical therapy - we will continue current  meds  2. HTN - elevated in clinic, we will increase her lisionpril/HCTZ combination to 40mg /25mg . Check labs in 2 weeks.   3. Hyperlipidemia - repeat lipid panel - continue current statin  4. Carotid bruits - mild stenosis by recent US, repeat in 01/2017  5. Aortic stenosis - no curernt symptoms, repeat echo next year  F/u 4 months  [name], M.D.,

## 2015-06-13 ENCOUNTER — Telehealth: Payer: Self-pay | Admitting: *Deleted

## 2015-06-13 NOTE — Telephone Encounter (Signed)
Patient left message on voicemail - seen 4/3 by Dr. Harl Bowie, had questions about her medication.  Attempted to return call - no answer.

## 2015-06-14 NOTE — Telephone Encounter (Signed)
Pt wanted to verify change in medication, verbally gave instructions on Prinzide. Pt voiced understanding

## 2015-06-16 ENCOUNTER — Other Ambulatory Visit: Payer: Self-pay | Admitting: Cardiovascular Disease

## 2015-06-16 NOTE — Telephone Encounter (Signed)
Christina Boyle called stating that she went to pick up lisinopril-hydrochlorothiazide (PRINZIDE,ZESTORETIC) 20-12.5 MG tablet  She was told that the rx would be doubled. Please advise.

## 2015-06-16 NOTE — Telephone Encounter (Signed)
Medication was not doubled, pt picked up old refill, new dose of Prinzide was received on 4/3 by pharmacy. Pt will take 2 tablets daily and pcik up new rx when refill is out.

## 2015-06-27 ENCOUNTER — Emergency Department (HOSPITAL_COMMUNITY): Payer: Medicaid Other

## 2015-06-27 ENCOUNTER — Inpatient Hospital Stay (HOSPITAL_COMMUNITY)
Admission: EM | Admit: 2015-06-27 | Discharge: 2015-07-04 | DRG: 246 | Disposition: A | Discharge status: Home / Self Care | Payer: Medicaid Other | Attending: Cardiovascular Disease | Admitting: Cardiovascular Disease

## 2015-06-27 ENCOUNTER — Encounter (HOSPITAL_COMMUNITY): Payer: Self-pay | Admitting: *Deleted

## 2015-06-27 DIAGNOSIS — E785 Hyperlipidemia, unspecified: Secondary | ICD-10-CM | POA: Diagnosis present

## 2015-06-27 DIAGNOSIS — K219 Gastro-esophageal reflux disease without esophagitis: Secondary | ICD-10-CM | POA: Diagnosis present

## 2015-06-27 DIAGNOSIS — I350 Nonrheumatic aortic (valve) stenosis: Secondary | ICD-10-CM | POA: Diagnosis present

## 2015-06-27 DIAGNOSIS — R0602 Shortness of breath: Secondary | ICD-10-CM

## 2015-06-27 DIAGNOSIS — N289 Disorder of kidney and ureter, unspecified: Secondary | ICD-10-CM

## 2015-06-27 DIAGNOSIS — E669 Obesity, unspecified: Secondary | ICD-10-CM | POA: Diagnosis present

## 2015-06-27 DIAGNOSIS — Z8541 Personal history of malignant neoplasm of cervix uteri: Secondary | ICD-10-CM

## 2015-06-27 DIAGNOSIS — I214 Non-ST elevation (NSTEMI) myocardial infarction: Secondary | ICD-10-CM

## 2015-06-27 DIAGNOSIS — E8770 Fluid overload, unspecified: Secondary | ICD-10-CM | POA: Diagnosis not present

## 2015-06-27 DIAGNOSIS — J9601 Acute respiratory failure with hypoxia: Secondary | ICD-10-CM

## 2015-06-27 DIAGNOSIS — N189 Chronic kidney disease, unspecified: Secondary | ICD-10-CM | POA: Diagnosis present

## 2015-06-27 DIAGNOSIS — I10 Essential (primary) hypertension: Secondary | ICD-10-CM | POA: Diagnosis present

## 2015-06-27 DIAGNOSIS — Z951 Presence of aortocoronary bypass graft: Secondary | ICD-10-CM

## 2015-06-27 DIAGNOSIS — F17200 Nicotine dependence, unspecified, uncomplicated: Secondary | ICD-10-CM | POA: Diagnosis present

## 2015-06-27 DIAGNOSIS — Z955 Presence of coronary angioplasty implant and graft: Secondary | ICD-10-CM

## 2015-06-27 DIAGNOSIS — Z79899 Other long term (current) drug therapy: Secondary | ICD-10-CM

## 2015-06-27 DIAGNOSIS — F17210 Nicotine dependence, cigarettes, uncomplicated: Secondary | ICD-10-CM | POA: Diagnosis present

## 2015-06-27 DIAGNOSIS — J441 Chronic obstructive pulmonary disease with (acute) exacerbation: Secondary | ICD-10-CM | POA: Diagnosis not present

## 2015-06-27 DIAGNOSIS — Q251 Coarctation of aorta: Secondary | ICD-10-CM

## 2015-06-27 DIAGNOSIS — Z7982 Long term (current) use of aspirin: Secondary | ICD-10-CM

## 2015-06-27 DIAGNOSIS — Z6835 Body mass index (BMI) 35.0-35.9, adult: Secondary | ICD-10-CM

## 2015-06-27 DIAGNOSIS — F329 Major depressive disorder, single episode, unspecified: Secondary | ICD-10-CM | POA: Diagnosis present

## 2015-06-27 DIAGNOSIS — I119 Hypertensive heart disease without heart failure: Secondary | ICD-10-CM | POA: Diagnosis present

## 2015-06-27 DIAGNOSIS — I2510 Atherosclerotic heart disease of native coronary artery without angina pectoris: Secondary | ICD-10-CM | POA: Diagnosis present

## 2015-06-27 DIAGNOSIS — T82898A Other specified complication of vascular prosthetic devices, implants and grafts, initial encounter: Principal | ICD-10-CM | POA: Diagnosis present

## 2015-06-27 DIAGNOSIS — Y832 Surgical operation with anastomosis, bypass or graft as the cause of abnormal reaction of the patient, or of later complication, without mention of misadventure at the time of the procedure: Secondary | ICD-10-CM | POA: Diagnosis present

## 2015-06-27 DIAGNOSIS — Z8711 Personal history of peptic ulcer disease: Secondary | ICD-10-CM

## 2015-06-27 DIAGNOSIS — I1310 Hypertensive heart and chronic kidney disease without heart failure, with stage 1 through stage 4 chronic kidney disease, or unspecified chronic kidney disease: Secondary | ICD-10-CM | POA: Diagnosis present

## 2015-06-27 HISTORY — DX: Obesity, unspecified: E66.9

## 2015-06-27 LAB — CBC WITH DIFFERENTIAL/PLATELET
Basophils Absolute: 0 10*3/uL (ref 0.0–0.1)
Basophils Relative: 0 %
Eosinophils Absolute: 0.3 10*3/uL (ref 0.0–0.7)
Eosinophils Relative: 4 %
HEMATOCRIT: 48.5 % — AB (ref 36.0–46.0)
HEMOGLOBIN: 15.9 g/dL — AB (ref 12.0–15.0)
LYMPHS ABS: 1.4 10*3/uL (ref 0.7–4.0)
LYMPHS PCT: 18 %
MCH: 30.5 pg (ref 26.0–34.0)
MCHC: 32.8 g/dL (ref 30.0–36.0)
MCV: 93.1 fL (ref 78.0–100.0)
MONOS PCT: 12 %
Monocytes Absolute: 1 10*3/uL (ref 0.1–1.0)
NEUTROS PCT: 66 %
Neutro Abs: 5 10*3/uL (ref 1.7–7.7)
PLATELETS: 169 10*3/uL (ref 150–400)
RBC: 5.21 MIL/uL — AB (ref 3.87–5.11)
RDW: 14.7 % (ref 11.5–15.5)
WBC: 7.8 10*3/uL (ref 4.0–10.5)

## 2015-06-27 LAB — BASIC METABOLIC PANEL
ANION GAP: 14 (ref 5–15)
BUN: 17 mg/dL (ref 6–20)
CO2: 25 mmol/L (ref 22–32)
Calcium: 9.8 mg/dL (ref 8.9–10.3)
Chloride: 102 mmol/L (ref 101–111)
Creatinine, Ser: 0.92 mg/dL (ref 0.44–1.00)
Glucose, Bld: 109 mg/dL — ABNORMAL HIGH (ref 65–99)
POTASSIUM: 3.8 mmol/L (ref 3.5–5.1)
SODIUM: 141 mmol/L (ref 135–145)

## 2015-06-27 LAB — TROPONIN I
Troponin I: 0.1 ng/mL — ABNORMAL HIGH (ref <0.031)
Troponin I: 0.47 ng/mL — ABNORMAL HIGH (ref <0.031)

## 2015-06-27 MED ORDER — NICOTINE 21 MG/24HR TD PT24
21.0000 mg | MEDICATED_PATCH | Freq: Every day | TRANSDERMAL | Status: DC
  Administered 2015-06-27 – 2015-07-04 (×8): 21 mg via TRANSDERMAL
  Filled 2015-06-27 (×8): qty 1

## 2015-06-27 MED ORDER — METHYLPREDNISOLONE SODIUM SUCC 125 MG IJ SOLR
60.0000 mg | Freq: Four times a day (QID) | INTRAMUSCULAR | Status: DC
  Administered 2015-06-27 – 2015-07-04 (×28): 60 mg via INTRAVENOUS
  Filled 2015-06-27 (×28): qty 2

## 2015-06-27 MED ORDER — ONDANSETRON HCL 4 MG/2ML IJ SOLN
4.0000 mg | Freq: Once | INTRAMUSCULAR | Status: AC
  Administered 2015-06-27: 4 mg via INTRAVENOUS
  Filled 2015-06-27: qty 2

## 2015-06-27 MED ORDER — HYDROCHLOROTHIAZIDE 12.5 MG PO CAPS
12.5000 mg | ORAL_CAPSULE | Freq: Every day | ORAL | Status: DC
  Administered 2015-06-27 – 2015-06-29 (×3): 12.5 mg via ORAL
  Filled 2015-06-27 (×3): qty 1

## 2015-06-27 MED ORDER — IPRATROPIUM BROMIDE 0.02 % IN SOLN: 0.5000 mg | Freq: Once | RESPIRATORY_TRACT | Status: DC

## 2015-06-27 MED ORDER — IPRATROPIUM BROMIDE 0.02 % IN SOLN
0.5000 mg | Freq: Four times a day (QID) | RESPIRATORY_TRACT | Status: DC
  Administered 2015-06-27 – 2015-06-28 (×2): 0.5 mg via RESPIRATORY_TRACT
  Filled 2015-06-27 (×2): qty 2.5

## 2015-06-27 MED ORDER — ATORVASTATIN CALCIUM 80 MG PO TABS
80.0000 mg | ORAL_TABLET | Freq: Every day | ORAL | Status: DC
  Administered 2015-06-27 – 2015-07-04 (×8): 80 mg via ORAL
  Filled 2015-06-27 (×2): qty 1
  Filled 2015-06-27: qty 2
  Filled 2015-06-27 (×4): qty 1
  Filled 2015-06-27: qty 2

## 2015-06-27 MED ORDER — ACETAMINOPHEN 500 MG PO TABS
1000.0000 mg | ORAL_TABLET | Freq: Four times a day (QID) | ORAL | Status: DC | PRN
  Administered 2015-06-27 – 2015-07-01 (×2): 1000 mg via ORAL
  Filled 2015-06-27 (×2): qty 2

## 2015-06-27 MED ORDER — LISINOPRIL-HYDROCHLOROTHIAZIDE 20-12.5 MG PO TABS: 1.0000 | ORAL_TABLET | Freq: Every day | ORAL | Status: DC

## 2015-06-27 MED ORDER — LISINOPRIL 10 MG PO TABS
20.0000 mg | ORAL_TABLET | Freq: Every day | ORAL | Status: DC
  Administered 2015-06-27 – 2015-06-29 (×3): 20 mg via ORAL
  Filled 2015-06-27 (×3): qty 2

## 2015-06-27 MED ORDER — ASPIRIN EC 81 MG PO TBEC
81.0000 mg | DELAYED_RELEASE_TABLET | Freq: Every day | ORAL | Status: DC
  Administered 2015-06-27 – 2015-06-28 (×2): 81 mg via ORAL
  Filled 2015-06-27 (×2): qty 1

## 2015-06-27 MED ORDER — ALBUTEROL SULFATE (2.5 MG/3ML) 0.083% IN NEBU: 5.0000 mg | INHALATION_SOLUTION | Freq: Once | RESPIRATORY_TRACT | Status: DC

## 2015-06-27 MED ORDER — ONDANSETRON HCL 4 MG PO TABS: 4.0000 mg | ORAL_TABLET | Freq: Four times a day (QID) | ORAL | Status: DC | PRN

## 2015-06-27 MED ORDER — ONDANSETRON HCL 4 MG/2ML IJ SOLN: 4.0000 mg | Freq: Four times a day (QID) | INTRAMUSCULAR | Status: DC | PRN

## 2015-06-27 MED ORDER — PREDNISONE 50 MG PO TABS
60.0000 mg | ORAL_TABLET | Freq: Once | ORAL | Status: AC
  Administered 2015-06-27: 60 mg via ORAL
  Filled 2015-06-27: qty 1

## 2015-06-27 MED ORDER — SODIUM CHLORIDE 0.9 % IV SOLN
INTRAVENOUS | Status: DC
  Administered 2015-06-27: 17:00:00 via INTRAVENOUS

## 2015-06-27 MED ORDER — ALBUTEROL (5 MG/ML) CONTINUOUS INHALATION SOLN
10.0000 mg/h | INHALATION_SOLUTION | Freq: Once | RESPIRATORY_TRACT | Status: AC
  Administered 2015-06-27: 10 mg/h via RESPIRATORY_TRACT
  Filled 2015-06-27: qty 20

## 2015-06-27 MED ORDER — ALBUTEROL SULFATE (2.5 MG/3ML) 0.083% IN NEBU
2.5000 mg | INHALATION_SOLUTION | Freq: Once | RESPIRATORY_TRACT | Status: AC
  Administered 2015-06-27: 2.5 mg via RESPIRATORY_TRACT
  Filled 2015-06-27: qty 3

## 2015-06-27 MED ORDER — SODIUM CHLORIDE 0.9 % IV BOLUS (SEPSIS)
1000.0000 mL | Freq: Once | INTRAVENOUS | Status: AC
  Administered 2015-06-27: 1000 mL via INTRAVENOUS

## 2015-06-27 MED ORDER — ALBUTEROL SULFATE (2.5 MG/3ML) 0.083% IN NEBU
5.0000 mg | INHALATION_SOLUTION | Freq: Once | RESPIRATORY_TRACT | Status: AC
  Administered 2015-06-27: 5 mg via RESPIRATORY_TRACT
  Filled 2015-06-27: qty 6

## 2015-06-27 MED ORDER — ENOXAPARIN SODIUM 40 MG/0.4ML ~~LOC~~ SOLN
40.0000 mg | SUBCUTANEOUS | Status: DC
  Administered 2015-06-27: 40 mg via SUBCUTANEOUS
  Filled 2015-06-27 (×2): qty 0.4

## 2015-06-27 MED ORDER — ONDANSETRON 8 MG PO TBDP
8.0000 mg | ORAL_TABLET | Freq: Once | ORAL | Status: AC
  Administered 2015-06-27: 8 mg via ORAL
  Filled 2015-06-27: qty 1

## 2015-06-27 MED ORDER — METOPROLOL TARTRATE 50 MG PO TABS
75.0000 mg | ORAL_TABLET | Freq: Two times a day (BID) | ORAL | Status: DC
  Administered 2015-06-27 – 2015-07-04 (×14): 75 mg via ORAL
  Filled 2015-06-27 (×2): qty 1
  Filled 2015-06-27 (×2): qty 3
  Filled 2015-06-27 (×2): qty 1
  Filled 2015-06-27 (×2): qty 3
  Filled 2015-06-27 (×3): qty 1
  Filled 2015-06-27: qty 3
  Filled 2015-06-27 (×2): qty 1
  Filled 2015-06-27: qty 3

## 2015-06-27 MED ORDER — LEVALBUTEROL HCL 0.63 MG/3ML IN NEBU
0.6300 mg | INHALATION_SOLUTION | Freq: Four times a day (QID) | RESPIRATORY_TRACT | Status: DC
  Administered 2015-06-27 – 2015-06-28 (×2): 0.63 mg via RESPIRATORY_TRACT
  Filled 2015-06-27 (×2): qty 3

## 2015-06-27 MED ORDER — ISOSORBIDE MONONITRATE ER 60 MG PO TB24
30.0000 mg | ORAL_TABLET | Freq: Every day | ORAL | Status: DC
  Administered 2015-06-27 – 2015-06-28 (×2): 30 mg via ORAL
  Filled 2015-06-27 (×2): qty 1

## 2015-06-27 MED ORDER — IPRATROPIUM-ALBUTEROL 0.5-2.5 (3) MG/3ML IN SOLN
3.0000 mL | Freq: Once | RESPIRATORY_TRACT | Status: AC
  Administered 2015-06-27: 3 mL via RESPIRATORY_TRACT
  Filled 2015-06-27: qty 3

## 2015-06-27 NOTE — ED Provider Notes (Signed)
CSN: [id number]     Arrival date & time 06/27/15  [id number] History  By signing my name below, I, Christina Boyle, attest that this documentation has been prepared under the direction and in the presence of Christina Fraise, MD . Electronically Signed: [name], Scribe. 06/27/2015. 10:33 AM.    Chief Complaint  Patient presents with  . Emesis    Patient is a 60 y.o. female presenting with cough. The history is provided by the patient. No language interpreter was used.  Cough Cough characteristics:  Productive Sputum characteristics:  Yellow Severity:  Moderate Duration:  5 days Progression:  Unchanged Smoker: yes   Relieved by:  Nothing Associated symptoms: chest pain and shortness of breath   Associated symptoms: no fever   Shortness of breath:    Severity:  Mild  HPI Comments:  Christina Boyle is a 60 y.o. female who presents to the Emergency Department complaining of a  productive cough x 5 days with yellow sputum. She reports associated generalized weakness, nausea, and vomiting x 2 days, decreased appetite, CP secondary to cough, and mild SOB. Pt is a current smoker; reports h/o COPD.  She denies diarrhea, fever,  hemoptysis and abdominal pain.  No alleviating factors noted.  Past Medical History  Diagnosis Date  . ASCVD (arteriosclerotic cardiovascular disease) 02/2006    coronary artery bypass graft surgery  . Tobacco abuse   . Chronic bronchitis   . Coarctation of aorta   . Hyperlipidemia   . Hypertension   . Coronary artery disease   . COPD (chronic obstructive pulmonary disease) ([place])   . Shortness of breath dyspnea   . Depression   . GERD (gastroesophageal reflux disease)   . Cervical cancer [facility])    Past Surgical History  Procedure Laterality Date  . Left breast biopsy      for benign disease  . Cholecystectomy    . Coronary artery bypass graft  02/2006  . Cervical cone biopsy    . Esophagogastroduodenoscopy N/A 07/22/2014    Procedure:  ESOPHAGOGASTRODUODENOSCOPY (EGD);  Surgeon: [name], MD;  Location: [facility] ENDOSCOPY;  Service: Endoscopy;  Laterality: N/A;   Family History  Problem Relation Age of Onset  . Heart attack Father 78  . Lung cancer Mother 92   Social History  Substance Use Topics  . Smoking status: Former Smoker -- 1.00 packs/day for 50 years    Types: Cigarettes    Start date: 05/21/1969  . Smokeless tobacco: Never Used  . Alcohol Use: No   OB History    No data available     Review of Systems  Constitutional: Negative for fever.  Respiratory: Positive for cough and shortness of breath.   Cardiovascular: Positive for chest pain.  Gastrointestinal: Positive for nausea and vomiting. Negative for abdominal pain and diarrhea.  Neurological: Positive for weakness (generalized).  All other systems reviewed and are negative.   Allergies  Review of patient's allergies indicates no known allergies.  Home Medications   Prior to Admission medications   Medication Sig Start Date End Date Taking? Authorizing Provider  acetaminophen (TYLENOL) 500 MG tablet Take 1,000 mg by mouth every 6 (six) hours as needed for moderate pain.    Historical Provider, MD  albuterol (PROVENTIL HFA;VENTOLIN HFA) 108 (90 Base) MCG/ACT inhaler Inhale 1-2 puffs into the lungs every 6 (six) hours as needed for wheezing or shortness of breath.    Historical Provider, MD  aspirin EC 81 MG EC tablet Take 1 tablet (81 mg  total) by mouth daily. 07/23/14   Christina Altes, MD  atorvastatin (LIPITOR) 80 MG tablet TAKE ONE TABLET BY MOUTH ONCE DAILY 01/26/15   Christina Lenis, MD  isosorbide mononitrate (IMDUR) 30 MG 24 hr tablet TAKE ONE TABLET BY MOUTH ONCE DAILY 02/13/15   Christina Lenis, MD  lisinopril-hydrochlorothiazide [facility]) 20-12.5 MG tablet Take 2 tablets daily 06/12/15   Christina Lenis, MD  metoprolol (LOPRESSOR) 50 MG tablet TAKE ONE & ONE-HALF TABLETS BY MOUTH TWICE DAILY 02/13/15   Christina Lenis,  MD  nicotine (NICODERM CQ) 14 mg/24hr patch PLACE 1 14MG  PATCH DAILY FOR 2 WEEKS 06/12/15   Christina Lenis, MD  nicotine (NICODERM CQ) 21 mg/24hr patch PLACE 1 21 MG PATCH DAILY FOR 6 WEEKS 06/12/15   Christina Lenis, MD  nicotine (NICODERM CQ) 7 mg/24hr patch PLACE 1 7 MG PATCH DAILY FOR 2 WEEKS 06/12/15   Christina Lenis, MD  nitroGLYCERIN (NITROSTAT) 0.4 MG SL tablet Place 1 tablet (0.4 mg total) under the tongue every 5 (five) minutes as needed for chest pain. 01/26/15   Christina Lenis, MD  Tiotropium Bromide Monohydrate (SPIRIVA RESPIMAT) 2.5 MCG/ACT AERS Inhale 1 puff into the lungs as needed.     Historical Provider, MD   BP 207/75 mmHg  Pulse 88  Temp(Src) 97.5 F (36.4 C) (Oral)  Resp 18  Ht 5\' 2"  (1.575 m)  Wt 194 lb (87.998 kg)  BMI 35.47 kg/m2  SpO2 91% Physical Exam   CONSTITUTIONAL: Well developed/well nourished HEAD: Normocephalic/atraumatic EYES: EOMI/PERRL ENMT: Mucous membranes moist NECK: supple no meningeal signs SPINE/BACK:entire spine nontender CV: S1/S2 noted,  Murmur noted LUNGS: Wheezing bilaterally, no apparent distress ABDOMEN: soft, nontender, no rebound or guarding, bowel sounds noted throughout abdomen GU:no cva tenderness NEURO: Pt is awake/alert/appropriate, moves all extremitiesx4.  No facial droop.   EXTREMITIES: pulses normal/equal, full ROM. No edema  SKIN: warm, color normal PSYCH: no abnormalities of mood noted, alert and oriented to situation  ED Course  Procedures  CRITICAL CARE Performed by: Sharyon Cable Total critical care time: 33 minutes Critical care time was exclusive of separately billable procedures and treating other patients. Critical care was necessary to treat or prevent imminent or life-threatening deterioration. Critical care was time spent personally by me on the following activities: development of treatment plan with patient and/or surrogate as well as nursing, discussions with consultants, evaluation of  patient's response to treatment, examination of patient, obtaining history from patient or surrogate, ordering and performing treatments and interventions, ordering and review of laboratory studies, ordering and review of radiographic studies, pulse oximetry and re-evaluation of patient's condition. PATIENT WITH COPD EXACERBATION SHE HAS HAD MULTIPLE ALBUTEROL NEBULIZED TREATMENTS INCLUDING HOUR LONG CONTINUOUS NEBULIZED TREATMENT SHE IS STILL TACHYPNEIC.  SHE HAS HAD HYPOXIA IN THE ER SHE IS DISTRESS UPON AMBULATION SHE REQUIRES ADMISSION   DIAGNOSTIC STUDIES: Oxygen Saturation is 91% on RA, low by my interpretation.    COORDINATION OF CARE:  10:37 AM Will order breathing tx, EKG and CXR. Discussed treatment plan with pt at bedside and pt agreed to plan. 12:30 PM Pt appeared with probable COPD exacerbation, now with vomiting.  Her CP was only with cough After neb treatments, she still has persistent wheeze.  She now reports feeling nausea and lightheadedness IV fluids and troponin have been ordered 3:33 PM PT GIVEN MULTIPLE NEB TREATMENTS BUT WITH WHEEZING/SHORTNESS OF BREATH SHE HAS HYPOXIA ON ROOM AIR AT REST SHE CONTINUES TO REPORT CHEST SORENESS REPEAT  EKG REVEALS NARROW TACHYCARDIC RHYTHM, P WAVES APPEAR BURIED, COULD REPRESENT ? A-FLUTTER, POSSIBLY DUE TO ALBUTEROL SHE CONTINUES TO FEEL GENERALIZED WEAKNESS I FEEL SHE REQUIRES ADMISSION AS SHE HAS NOT IMPROVED WITH AGGRESSIVE TREATMENT IN THE ER D/W DR LAMA, HE WILL SEE PATIENT WE REVIEWED CASE TOGETHER WILL ADMIT TELE BED PT AGREEABLE WITH PLAN   Labs Review Labs Reviewed  CBC WITH DIFFERENTIAL/PLATELET - Abnormal; Notable for the following:    RBC 5.21 (*)    Hemoglobin 15.9 (*)    HCT 48.5 (*)    All other components within normal limits  BASIC METABOLIC PANEL - Abnormal; Notable for the following:    Glucose, Bld 109 (*)    All other components within normal limits  TROPONIN I    Imaging Review Dg Chest 2  View  06/27/2015  CLINICAL DATA:  60 year old with productive cough, congestion and shortness of breath for 5 days. History of COPD. EXAM: CHEST  2 VIEW COMPARISON:  03/21/2015. FINDINGS: The heart size and mediastinal contours are stable status post CABG. There is stable mild chronic lung disease with central airway thickening and mild hyperinflation. No evidence of edema, airspace disease, pleural effusion or pneumothorax. The bones appear unchanged. IMPRESSION: Stable postoperative chest.  No acute cardiopulmonary process. Electronically Signed   By: Richardean Sale M.D.   On: 06/27/2015 11:18   I have personally reviewed and evaluated these images and lab results as part of my medical decision-making.   EKG Interpretation   Date/Time:  Tuesday June 27 2015 10:46:11 EDT Ventricular Rate:  82 PR Interval:  177 QRS Duration: 98 QT Interval:  350 QTC Calculation: 409 R Axis:   56 Text Interpretation:  Sinus rhythm Consider left atrial enlargement  Nonspecific ST and T wave abnormality No significant change since last  tracing Confirmed by Christy Gentles  MD, [place] ([id number]) on 06/27/2015 10:51:50 AM      EKG Interpretation  Date/Time:  Tuesday June 27 2015 15:21:09 EDT Ventricular Rate:  121 PR Interval:  160 QRS Duration: 86 QT Interval:  352 QTC Calculation: 499 R Axis:   53 Text Interpretation:  Sinus tachycardia Repol abnrm, prob ischemia, inferolateral lds Abnormal ekg Confirmed by Christy Gentles  MD, Jerilee Space ([id number]) on 06/27/2015 3:29:46 PM        Medications  predniSONE (DELTASONE) tablet 60 mg (60 mg Oral Given 06/27/15 1050)  ondansetron (ZOFRAN-ODT) disintegrating tablet 8 mg (8 mg Oral Given 06/27/15 1051)  ipratropium-albuterol (DUONEB) 0.5-2.5 (3) MG/3ML nebulizer solution 3 mL (3 mLs Nebulization Given 06/27/15 1131)  albuterol (PROVENTIL) (2.5 MG/3ML) 0.083% nebulizer solution 2.5 mg (2.5 mg Nebulization Given 06/27/15 1131)  albuterol (PROVENTIL) (2.5 MG/3ML) 0.083% nebulizer  solution 5 mg (5 mg Nebulization Given 06/27/15 1144)  ondansetron (ZOFRAN) injection 4 mg (4 mg Intravenous Given 06/27/15 1229)  sodium chloride 0.9 % bolus 1,000 mL (0 mLs Intravenous Stopped 06/27/15 1312)  albuterol (PROVENTIL,VENTOLIN) solution continuous neb (10 mg/hr Nebulization Given 06/27/15 1421)    MDM   Final diagnoses:  Chronic obstructive pulmonary disease with acute exacerbation (HCC)  Acute respiratory failure with hypoxia ([place])   Nursing notes including past medical history and social history reviewed and considered in documentation xrays/imaging reviewed by myself and considered during evaluation Labs/vital reviewed myself and considered during evaluation Previous records reviewed and considered - pt with h/o COPD/CAD   I personally performed the services described in this documentation, which was scribed in my presence. The recorded information has been reviewed and is accurate.  Christina Fraise, MD 06/27/15 1536

## 2015-06-27 NOTE — Progress Notes (Signed)
Midlevel informed of elevated troponin of 0.47. Pt is asymptomatic and NSR on telemetry.

## 2015-06-27 NOTE — ED Notes (Signed)
Hospitalist at bedside 

## 2015-06-27 NOTE — ED Notes (Signed)
Pt reports cough and generalized weakness x 5 days. N/V began yesterday. Denies diarrhea and fevers.

## 2015-06-27 NOTE — H&P (Signed)
PCP:   Alonza Bogus, MD   Chief Complaint:  Shortness of breath  HPI:  60 year old female who  has a past medical history of ASCVD (arteriosclerotic cardiovascular disease) (02/2006); Tobacco abuse; Chronic bronchitis; Coarctation of aorta; Hyperlipidemia; Hypertension; Coronary artery disease; COPD (chronic obstructive pulmonary disease) ([place]); Shortness of breath dyspnea; Depression; GERD (gastroesophageal reflux disease); and Cervical cancer ([place]). Today presents to the hospital with complaints of shortness of breath and chest pain which started this morning. Patient has a history of CAD, status post CABG. This morning patient noted she was more short of breath and usual and also had chest tightness with cold clammy sensation. She denied nausea or vomiting. No diarrhea constipation. Denies any fever or dysuria. Patient has a history of COPD and smokes 1 pack cigarettes per day. In the ED patient was given albuterol nebulizer along with ipratropium and prednisone. Breathing has improved somewhat. Denies chest pain at this time.  Allergies:  No Known Allergies    Past Medical History  Diagnosis Date  . ASCVD (arteriosclerotic cardiovascular disease) 02/2006    coronary artery bypass graft surgery  . Tobacco abuse   . Chronic bronchitis   . Coarctation of aorta   . Hyperlipidemia   . Hypertension   . Coronary artery disease   . COPD (chronic obstructive pulmonary disease) ([place])   . Shortness of breath dyspnea   . Depression   . GERD (gastroesophageal reflux disease)   . Cervical cancer [facility])     Past Surgical History  Procedure Laterality Date  . Left breast biopsy      for benign disease  . Cholecystectomy    . Coronary artery bypass graft  02/2006  . Cervical cone biopsy    . Esophagogastroduodenoscopy N/A 07/22/2014    Procedure: ESOPHAGOGASTRODUODENOSCOPY (EGD);  Surgeon: [name], MD;  Location: [facility] ENDOSCOPY;  Service: Endoscopy;  Laterality: N/A;     Prior to Admission medications   Medication Sig Start Date End Date Taking? Authorizing Provider  aspirin EC 81 MG EC tablet Take 1 tablet (81 mg total) by mouth daily. 07/23/14  Yes [name], MD  atorvastatin (LIPITOR) 80 MG tablet TAKE ONE TABLET BY MOUTH ONCE DAILY 01/26/15  Yes [name], MD  doxycycline (VIBRAMYCIN) 100 MG capsule Take 100 mg by mouth 2 (two) times daily as needed (for symptoms).   Yes Historical Provider, MD  isosorbide mononitrate (IMDUR) 30 MG 24 hr tablet TAKE ONE TABLET BY MOUTH ONCE DAILY 02/13/15  Yes [name], MD  lisinopril-hydrochlorothiazide (PRINZIDE,ZESTORETIC) 20-12.5 MG tablet Take 2 tablets daily Patient taking differently: Take 1 tablet by mouth daily.  06/12/15  Yes [name], MD  metoprolol (LOPRESSOR) 50 MG tablet TAKE ONE & ONE-HALF TABLETS BY MOUTH TWICE DAILY 02/13/15  Yes [name], MD  nitroGLYCERIN (NITROSTAT) 0.4 MG SL tablet Place 1 tablet (0.4 mg total) under the tongue every 5 (five) minutes as needed for chest pain. 01/26/15  Yes [name], MD  Tiotropium Bromide Monohydrate (SPIRIVA RESPIMAT) 2.5 MCG/ACT AERS Inhale 1 puff into the lungs as needed.    Yes Historical Provider, MD  acetaminophen (TYLENOL) 500 MG tablet Take 1,000 mg by mouth every 6 (six) hours as needed for moderate pain.    Historical Provider, MD  albuterol (PROVENTIL HFA;VENTOLIN HFA) 108 (90 Base) MCG/ACT inhaler Inhale 1-2 puffs into the lungs every 6 (six) hours as needed for wheezing or shortness of breath.    Historical Provider, MD  nicotine (NICODERM CQ) 14 mg/24hr patch PLACE 1 14MG  PATCH DAILY FOR 2 WEEKS 06/12/15   [name], MD  nicotine (NICODERM CQ) 21 mg/24hr patch PLACE 1 21 MG PATCH DAILY FOR 6 WEEKS 06/12/15   [name], MD  nicotine (NICODERM CQ) 7 mg/24hr patch PLACE 1 7 MG PATCH DAILY FOR 2 WEEKS 06/12/15   [name], MD    Social History:  reports that she has quit smoking. Her smoking  use included Cigarettes. She started smoking about 46 years ago. She has a 50 pack-year smoking history. She has never used smokeless tobacco. She reports that she does not drink alcohol or use illicit drugs.  Family History  Problem Relation Age of Onset  . Heart attack Father 35  . Lung cancer Mother 33    Filed Weights   06/27/15 1016  Weight: 87.998 kg (194 lb)    All the positives are listed in BOLD  Review of Systems:  HEENT: Headache, blurred vision, runny nose, sore throat Neck: Hypothyroidism, hyperthyroidism,,lymphadenopathy Chest : Shortness of breath, history of COPD, Asthma Heart : Chest pain, history of coronary arterey disease GI:  Nausea, vomiting, diarrhea, constipation, GERD GU: Dysuria, urgency, frequency of urination, hematuria Neuro: Stroke, seizures, syncope Psych: Depression, anxiety, hallucinations   Physical Exam: Blood pressure 146/78, pulse 117, temperature 97.5 F (36.4 C), temperature source Oral, resp. rate 23, height 5\' 2"  (1.575 m), weight 87.998 kg (194 lb), SpO2 93 %. Constitutional:   Patient is a well-developed and well-nourished female* in no acute distress and cooperative with exam. Head: Normocephalic and atraumatic Mouth: Mucus membranes moist Eyes: PERRL, EOMI, conjunctivae normal Neck: Supple, No Thyromegaly Cardiovascular: RRR, S1 normal, S2 normal Pulmonary/Chest: Bilateral wheezing Abdominal: Soft. Non-tender, non-distended, bowel sounds are normal, no masses, organomegaly, or guarding present.  Neurological: A&O x3, Strength is normal and symmetric bilaterally, cranial nerve II-XII are grossly intact, no focal motor deficit, sensory intact to light touch bilaterally.  Extremities : No Cyanosis, Clubbing or Edema  Labs on Admission:  Basic Metabolic Panel:  Recent Labs Lab 06/27/15 1057  NA 141  K 3.8  CL 102  CO2 25  GLUCOSE 109*  BUN 17  CREATININE 0.92  CALCIUM 9.8     Recent Labs Lab 06/27/15 1057  WBC 7.8   NEUTROABS 5.0  HGB 15.9*  HCT 48.5*  MCV 93.1  PLT 169   Cardiac Enzymes:  Recent Labs Lab 06/27/15 1057  TROPONINI <0.03     Radiological Exams on Admission: Dg Chest 2 View  06/27/2015  CLINICAL DATA:  59 year old with productive cough, congestion and shortness of breath for 5 days. History of COPD. EXAM: CHEST  2 VIEW COMPARISON:  03/21/2015. FINDINGS: The heart size and mediastinal contours are stable status post CABG. There is stable mild chronic lung disease with central airway thickening and mild hyperinflation. No evidence of edema, airspace disease, pleural effusion or pneumothorax. The bones appear unchanged. IMPRESSION: Stable postoperative chest.  No acute cardiopulmonary process. Electronically Signed   By: Richardean Sale M.D.   On: 06/27/2015 11:18    EKG: Independently reviewed. Sinus tachycardia   Assessment/Plan Active Problems:   Hyperlipemia   Hypertension   COPD exacerbation (HCC)   COPD exacerbation We'll admit the patient for COPD exacerbation, start Solu-Medrol 60 mg IV every 6 hours. Xopenex nebulizers every 6 hours, ipratropium nebulizer every 6 hours  Chest pain Patient was admitted in telemetry Will cycle troponin every 6 hours 3  History of CAD Stable,  continue aspirin, statin, metoprolol, Imdur  Hypertension Continue HCTZ/lisinopril, metoprolol  DVT prophylaxis Lovenox  Code status: Full code  Family discussion: Admission, patients condition and plan of care including tests being ordered have been discussed with the patient and her son and husband at bedside who indicate understanding and agree with the plan and Code Status.   Time Spent on Admission: 60 min  [place] [facility] Pager: [phone] 06/27/2015, 3:49 PM  If 7PM-7AM, please contact night-coverage  [email or web address]  Password [email or web address]

## 2015-06-27 NOTE — ED Notes (Signed)
Pt placed on 2 liters of oxygen. Pt o2 saturation on room air 88%. Pt tolerated oxygen well. 02 saturation is not 93%.nad noted.

## 2015-06-27 NOTE — ED Notes (Addendum)
Pt ambulated in hallway. Pt o2 saturation ranegd from 92-96%. Pt heart rate ranged from 120-126. Pt respirations ranged from 20-38. Pt reports "i just dont feel right." steady gait with generalized weakness also noted. EDP aware.

## 2015-06-28 ENCOUNTER — Observation Stay (HOSPITAL_COMMUNITY): Payer: Medicaid Other

## 2015-06-28 DIAGNOSIS — E8770 Fluid overload, unspecified: Secondary | ICD-10-CM | POA: Diagnosis not present

## 2015-06-28 DIAGNOSIS — I25110 Atherosclerotic heart disease of native coronary artery with unstable angina pectoris: Secondary | ICD-10-CM | POA: Diagnosis not present

## 2015-06-28 DIAGNOSIS — E785 Hyperlipidemia, unspecified: Secondary | ICD-10-CM

## 2015-06-28 DIAGNOSIS — E669 Obesity, unspecified: Secondary | ICD-10-CM | POA: Diagnosis present

## 2015-06-28 DIAGNOSIS — Z7982 Long term (current) use of aspirin: Secondary | ICD-10-CM | POA: Diagnosis not present

## 2015-06-28 DIAGNOSIS — I25810 Atherosclerosis of coronary artery bypass graft(s) without angina pectoris: Secondary | ICD-10-CM | POA: Diagnosis not present

## 2015-06-28 DIAGNOSIS — Q251 Coarctation of aorta: Secondary | ICD-10-CM | POA: Diagnosis not present

## 2015-06-28 DIAGNOSIS — Z951 Presence of aortocoronary bypass graft: Secondary | ICD-10-CM | POA: Diagnosis not present

## 2015-06-28 DIAGNOSIS — Z8541 Personal history of malignant neoplasm of cervix uteri: Secondary | ICD-10-CM | POA: Diagnosis not present

## 2015-06-28 DIAGNOSIS — K219 Gastro-esophageal reflux disease without esophagitis: Secondary | ICD-10-CM | POA: Diagnosis present

## 2015-06-28 DIAGNOSIS — Z8711 Personal history of peptic ulcer disease: Secondary | ICD-10-CM | POA: Diagnosis not present

## 2015-06-28 DIAGNOSIS — F329 Major depressive disorder, single episode, unspecified: Secondary | ICD-10-CM | POA: Diagnosis present

## 2015-06-28 DIAGNOSIS — Z6835 Body mass index (BMI) 35.0-35.9, adult: Secondary | ICD-10-CM | POA: Diagnosis not present

## 2015-06-28 DIAGNOSIS — R079 Chest pain, unspecified: Secondary | ICD-10-CM

## 2015-06-28 DIAGNOSIS — Y832 Surgical operation with anastomosis, bypass or graft as the cause of abnormal reaction of the patient, or of later complication, without mention of misadventure at the time of the procedure: Secondary | ICD-10-CM | POA: Diagnosis present

## 2015-06-28 DIAGNOSIS — N179 Acute kidney failure, unspecified: Secondary | ICD-10-CM | POA: Diagnosis not present

## 2015-06-28 DIAGNOSIS — N189 Chronic kidney disease, unspecified: Secondary | ICD-10-CM | POA: Diagnosis present

## 2015-06-28 DIAGNOSIS — I1310 Hypertensive heart and chronic kidney disease without heart failure, with stage 1 through stage 4 chronic kidney disease, or unspecified chronic kidney disease: Secondary | ICD-10-CM | POA: Diagnosis present

## 2015-06-28 DIAGNOSIS — I10 Essential (primary) hypertension: Secondary | ICD-10-CM | POA: Diagnosis not present

## 2015-06-28 DIAGNOSIS — N289 Disorder of kidney and ureter, unspecified: Secondary | ICD-10-CM | POA: Diagnosis not present

## 2015-06-28 DIAGNOSIS — J441 Chronic obstructive pulmonary disease with (acute) exacerbation: Secondary | ICD-10-CM | POA: Diagnosis not present

## 2015-06-28 DIAGNOSIS — J439 Emphysema, unspecified: Secondary | ICD-10-CM | POA: Diagnosis not present

## 2015-06-28 DIAGNOSIS — I350 Nonrheumatic aortic (valve) stenosis: Secondary | ICD-10-CM | POA: Diagnosis not present

## 2015-06-28 DIAGNOSIS — I214 Non-ST elevation (NSTEMI) myocardial infarction: Secondary | ICD-10-CM

## 2015-06-28 DIAGNOSIS — F17210 Nicotine dependence, cigarettes, uncomplicated: Secondary | ICD-10-CM | POA: Diagnosis present

## 2015-06-28 DIAGNOSIS — Z79899 Other long term (current) drug therapy: Secondary | ICD-10-CM | POA: Diagnosis not present

## 2015-06-28 DIAGNOSIS — I2510 Atherosclerotic heart disease of native coronary artery without angina pectoris: Secondary | ICD-10-CM | POA: Diagnosis present

## 2015-06-28 DIAGNOSIS — T82898A Other specified complication of vascular prosthetic devices, implants and grafts, initial encounter: Secondary | ICD-10-CM | POA: Diagnosis present

## 2015-06-28 LAB — CBC
HCT: 39 % (ref 36.0–46.0)
Hemoglobin: 12.6 g/dL (ref 12.0–15.0)
MCH: 30.2 pg (ref 26.0–34.0)
MCHC: 32.3 g/dL (ref 30.0–36.0)
MCV: 93.5 fL (ref 78.0–100.0)
PLATELETS: 186 10*3/uL (ref 150–400)
RBC: 4.17 MIL/uL (ref 3.87–5.11)
RDW: 14.7 % (ref 11.5–15.5)
WBC: 14.5 10*3/uL — AB (ref 4.0–10.5)

## 2015-06-28 LAB — ECHOCARDIOGRAM COMPLETE
Height: 62 in
WEIGHTICAEL: 3024 [oz_av]

## 2015-06-28 LAB — COMPREHENSIVE METABOLIC PANEL
ALBUMIN: 4.1 g/dL (ref 3.5–5.0)
ALT: 21 U/L (ref 14–54)
AST: 31 U/L (ref 15–41)
Alkaline Phosphatase: 70 U/L (ref 38–126)
Anion gap: 12 (ref 5–15)
BUN: 28 mg/dL — AB (ref 6–20)
CHLORIDE: 104 mmol/L (ref 101–111)
CO2: 21 mmol/L — ABNORMAL LOW (ref 22–32)
Calcium: 9.8 mg/dL (ref 8.9–10.3)
Creatinine, Ser: 1.34 mg/dL — ABNORMAL HIGH (ref 0.44–1.00)
GFR calc Af Amer: 48 mL/min — ABNORMAL LOW (ref >60)
GFR, EST NON AFRICAN AMERICAN: 41 mL/min — AB (ref >60)
Glucose, Bld: 164 mg/dL — ABNORMAL HIGH (ref 65–99)
POTASSIUM: 4 mmol/L (ref 3.5–5.1)
Sodium: 137 mmol/L (ref 135–145)
Total Bilirubin: 0.5 mg/dL (ref 0.3–1.2)
Total Protein: 7.2 g/dL (ref 6.5–8.1)

## 2015-06-28 LAB — TROPONIN I: TROPONIN I: 1.65 ng/mL — AB (ref <0.031)

## 2015-06-28 LAB — PROTIME-INR
INR: 1.07 (ref 0.00–1.49)
PROTHROMBIN TIME: 14.1 s (ref 11.6–15.2)

## 2015-06-28 LAB — HEPARIN LEVEL (UNFRACTIONATED): HEPARIN UNFRACTIONATED: 0.58 [IU]/mL (ref 0.30–0.70)

## 2015-06-28 MED ORDER — MELATONIN 3 MG PO TABS
3.0000 mg | ORAL_TABLET | Freq: Every evening | ORAL | Status: DC | PRN
  Administered 2015-06-29 (×2): 3 mg via ORAL
  Filled 2015-06-28 (×3): qty 1

## 2015-06-28 MED ORDER — HEPARIN BOLUS VIA INFUSION
4000.0000 [IU] | Freq: Once | INTRAVENOUS | Status: AC
  Administered 2015-06-28: 4000 [IU] via INTRAVENOUS
  Filled 2015-06-28: qty 4000

## 2015-06-28 MED ORDER — LEVALBUTEROL HCL 0.63 MG/3ML IN NEBU
0.6300 mg | INHALATION_SOLUTION | Freq: Four times a day (QID) | RESPIRATORY_TRACT | Status: DC
Start: 2015-06-28 — End: 2015-06-29
  Administered 2015-06-28 – 2015-06-29 (×5): 0.63 mg via RESPIRATORY_TRACT
  Filled 2015-06-28 (×6): qty 3

## 2015-06-28 MED ORDER — NON FORMULARY: 3.0000 mg | Freq: Every evening | Status: DC | PRN

## 2015-06-28 MED ORDER — SODIUM CHLORIDE 0.9% FLUSH: 3.0000 mL | INTRAVENOUS | Status: DC | PRN

## 2015-06-28 MED ORDER — ASPIRIN EC 325 MG PO TBEC
325.0000 mg | DELAYED_RELEASE_TABLET | Freq: Once | ORAL | Status: AC
  Administered 2015-06-28: 325 mg via ORAL
  Filled 2015-06-28: qty 1

## 2015-06-28 MED ORDER — HYDRALAZINE HCL 25 MG PO TABS
25.0000 mg | ORAL_TABLET | Freq: Once | ORAL | Status: AC
  Administered 2015-06-29: 25 mg via ORAL
  Filled 2015-06-28: qty 1

## 2015-06-28 MED ORDER — CETYLPYRIDINIUM CHLORIDE 0.05 % MT LIQD
7.0000 mL | Freq: Two times a day (BID) | OROMUCOSAL | Status: DC
  Administered 2015-06-29 – 2015-07-04 (×9): 7 mL via OROMUCOSAL

## 2015-06-28 MED ORDER — IPRATROPIUM BROMIDE 0.02 % IN SOLN
0.5000 mg | Freq: Four times a day (QID) | RESPIRATORY_TRACT | Status: DC
Start: 2015-06-28 — End: 2015-06-29
  Administered 2015-06-28 – 2015-06-29 (×5): 0.5 mg via RESPIRATORY_TRACT
  Filled 2015-06-28 (×6): qty 2.5

## 2015-06-28 MED ORDER — ASPIRIN 81 MG PO CHEW
81.0000 mg | CHEWABLE_TABLET | ORAL | Status: AC
  Administered 2015-06-29: 06:00:00 81 mg via ORAL
  Filled 2015-06-28: qty 1

## 2015-06-28 MED ORDER — ISOSORBIDE MONONITRATE ER 60 MG PO TB24
60.0000 mg | ORAL_TABLET | Freq: Every day | ORAL | Status: DC
  Administered 2015-06-29 – 2015-07-02 (×4): 60 mg via ORAL
  Filled 2015-06-28 (×4): qty 1

## 2015-06-28 MED ORDER — SODIUM CHLORIDE 0.9% FLUSH
3.0000 mL | Freq: Two times a day (BID) | INTRAVENOUS | Status: DC
  Administered 2015-06-29: 3 mL via INTRAVENOUS

## 2015-06-28 MED ORDER — HEPARIN (PORCINE) IN NACL 100-0.45 UNIT/ML-% IJ SOLN
850.0000 [IU]/h | INTRAMUSCULAR | Status: DC
  Administered 2015-06-28 – 2015-06-29 (×2): 850 [IU]/h via INTRAVENOUS
  Filled 2015-06-28 (×2): qty 250

## 2015-06-28 MED ORDER — SODIUM CHLORIDE 0.9 % IV SOLN
INTRAVENOUS | Status: DC
  Administered 2015-06-29 – 2015-06-30 (×2): via INTRAVENOUS

## 2015-06-28 MED ORDER — SODIUM CHLORIDE 0.9 % IV SOLN: 250.0000 mL | INTRAVENOUS | Status: DC | PRN

## 2015-06-28 NOTE — Progress Notes (Addendum)
ANTICOAGULATION CONSULT NOTE - Initial Consult  Pharmacy Consult for Heparin Indication: chest pain/ACS  No Known Allergies  Patient Measurements: Height: 5\' 2"  (157.5 cm) Weight: 189 lb (85.73 kg) IBW/kg (Calculated) : 50.1 HEPARIN DW (KG): 69.6   Labs:  Recent Labs  06/27/15 1057 06/27/15 1657 06/27/15 2134 06/28/15 0447  HGB 15.9*  --   --  12.6  HCT 48.5*  --   --  39.0  PLT 169  --   --  186  CREATININE 0.92  --   --  1.34*  TROPONINI <0.03 0.10* 0.47* 1.65*    Estimated Creatinine Clearance: 44.2 mL/min (by C-G formula based on Cr of 1.34).   Medical History: Past Medical History  Diagnosis Date  . ASCVD (arteriosclerotic cardiovascular disease) 02/2006    coronary artery bypass graft surgery  . Tobacco abuse   . Chronic bronchitis   . Coarctation of aorta   . Hyperlipidemia   . Hypertension   . Coronary artery disease   . COPD (chronic obstructive pulmonary disease) ([place])   . Shortness of breath dyspnea   . Depression   . GERD (gastroesophageal reflux disease)   . Cervical cancer (HCC)     Medications:  Prescriptions prior to admission  Medication Sig Dispense Refill Last Dose  . aspirin EC 81 MG EC tablet Take 1 tablet (81 mg total) by mouth daily.   06/26/2015 at Unknown time  . atorvastatin (LIPITOR) 80 MG tablet TAKE ONE TABLET BY MOUTH ONCE DAILY 90 tablet 3 06/26/2015 at Unknown time  . doxycycline (VIBRAMYCIN) 100 MG capsule Take 100 mg by mouth 2 (two) times daily as needed (for symptoms).   06/25/2015 at Unknown time  . isosorbide mononitrate (IMDUR) 30 MG 24 hr tablet TAKE ONE TABLET BY MOUTH ONCE DAILY 30 tablet 6 06/26/2015 at Unknown time  . lisinopril-hydrochlorothiazide (PRINZIDE,ZESTORETIC) 20-12.5 MG tablet Take 2 tablets daily (Patient taking differently: Take 1 tablet by mouth daily. ) 60 tablet 3 06/26/2015 at Unknown time  . metoprolol (LOPRESSOR) 50 MG tablet TAKE ONE & ONE-HALF TABLETS BY MOUTH TWICE DAILY 90 tablet 6 06/26/2015 at  [place]  . nitroGLYCERIN (NITROSTAT) 0.4 MG SL tablet Place 1 tablet (0.4 mg total) under the tongue every 5 (five) minutes as needed for chest pain. 25 tablet 3 unknown  . Tiotropium Bromide Monohydrate (SPIRIVA RESPIMAT) 2.5 MCG/ACT AERS Inhale 1 puff into the lungs as needed.    06/26/2015 at Unknown time  . acetaminophen (TYLENOL) 500 MG tablet Take 1,000 mg by mouth every 6 (six) hours as needed for moderate pain.   Taking  . albuterol (PROVENTIL HFA;VENTOLIN HFA) 108 (90 Base) MCG/ACT inhaler Inhale 1-2 puffs into the lungs every 6 (six) hours as needed for wheezing or shortness of breath.   Taking  . nicotine (NICODERM CQ) 14 mg/24hr patch PLACE 1 14MG  PATCH DAILY FOR 2 WEEKS 14 patch 0   . nicotine (NICODERM CQ) 21 mg/24hr patch PLACE 1 21 MG PATCH DAILY FOR 6 WEEKS 42 patch 0   . nicotine (NICODERM CQ) 7 mg/24hr patch PLACE 1 7 MG PATCH DAILY FOR 2 WEEKS 14 patch 0     Assessment: 60 yo female presents to ED with  5 days of coughing like bronchitis, headache, N/V.  She also has left chest tightness with left jaw pain.  NSTEMI with positive troponins, abnormal EKG and prolonged chest pain yest. May require cardiac catheterization. Start IV heparin  Goal of Therapy:  Heparin level 0.3-0.7 units/ml Monitor platelets by  anticoagulation protocol: Yes   Plan:  Give 4000 units bolus x 1 Start heparin infusion at 850 units/hr Check anti-Xa level in 6 hours and daily while on heparin Continue to monitor H&H and platelets  Isac Sarna, BS Pharm D, BCPS Clinical Pharmacist Pager [phone] 06/28/2015,10:51 AM  Addum:  Initial HL is therapeutic.  Cont drip at 850 units/hr.  F/u am labs Excell Seltzer, PharmD

## 2015-06-28 NOTE — Care Management Note (Signed)
Case Management Note  Patient Details  Name: Christina Boyle MRN: [id number] Date of Birth: 1955-12-29  Subjective/Objective:       Spoke with patient who is alert and oriented from home with family. No HH needs at this point. Patient will be transferred to [facility] today.    No CM needs identified.        Action/Plan: Transfer to [facility].   Expected Discharge Date:                  Expected Discharge Plan:   (Patient will transfer to [facility]  In-House Referral:     Discharge planning Services  CM Consult  Post Acute Care Choice:    Choice offered to:     DME Arranged:    DME Agency:     HH Arranged:    [place] Agency:     Status of Service:  Completed, signed off  Medicare Important Message Given:    Date Medicare IM Given:    Medicare IM give by:    Date Additional Medicare IM Given:    Additional Medicare Important Message give by:     If discussed at [place] of Stay Meetings, dates discussed:    Additional Comments:  Alvie Heidelberg, RN 06/28/2015, 1:01 PM

## 2015-06-28 NOTE — Progress Notes (Signed)
Patient found by RT with nasal cannula out of nose. After breathing treatment RT place O2 back on patient 3LNC sats now 91

## 2015-06-28 NOTE — Care Management Obs Status (Signed)
[place] NOTIFICATION   Patient Details  Name: TIFANI TUEY MRN: [id number] Date of Birth: 08-08-1955   Medicare Observation Status Notification Given:  Yes    Alvie Heidelberg, RN 06/28/2015, 12:45 PM

## 2015-06-28 NOTE — Progress Notes (Signed)
Pt has elevated troponin of 1.65. Pt is asymptomatic with no complaints and is NSR on telemetry.Dr. Willey Blade informed via phone and instructions given to continue to observe pt and Dr. Luan Pulling will be in to check on pt later.

## 2015-06-28 NOTE — Progress Notes (Signed)
Patients SPO2 on room air before neb tx was 88%.  Placed pt back on 2 lpm for O2 support.

## 2015-06-28 NOTE — Consult Note (Signed)
Reason for Consult: Chest pain Referring Physician: Dr. Sinda Du Cardiologist: Dr. Carlyle Dolly  Christina Boyle is an 60 y.o. female with multivessel CAD status post CABG in 2007, just saw Dr. Harl Bowie in office 06/12/15 and doing well. History of LIMA to LAD, SVG-OM, SVG to PDA, echo EF 60-65% 2014, 01/2014 exercise MPI didn't reach THR converted to [place]. Apical anterior and anterolateral ischemia, intermediate risk LVEF 62%. Started on Imdur 30 mg daily with improvement of symptoms. Also has HTN, moderate aortic stenosis, echo 12/2014 EF 55-60% mean grad 15, AVA VTI 1.36, Coarctation of the aorta, mild to moderate AS, carotid disease, tobacco abuse 1/2 ppd.  Patient complains of feeling bad for 5 days - coughing like bronchitis, head hurting, nausea, vomiting. Next had left chest tightness with left jaw pain lasted several hours yesterday. Didn't think to use a NTG. She has chest tightness several times a month, and states that it is getting more frequent. Occurs at rest or with exertion. Felt somewhat better in ER with nebulizer and NTG. ECG shows sinus tachycardia at 121/m with diffuse ST depression inf/lat. F/u today NSR with diffuse ST depression a little more prominent than in 2016. Troponins 0.01, 0.47, 1.65, creatinine 1.34 (0.92 yesterday).  Past Medical History  Diagnosis Date  . ASCVD (arteriosclerotic cardiovascular disease) 02/2006    coronary artery bypass graft surgery  . Tobacco abuse   . Chronic bronchitis   . Coarctation of aorta   . Hyperlipidemia   . Hypertension   . Coronary artery disease   . COPD (chronic obstructive pulmonary disease) ([place])   . Shortness of breath dyspnea   . Depression   . GERD (gastroesophageal reflux disease)   . Cervical cancer [facility])     Past Surgical History  Procedure Laterality Date  . Left breast biopsy      for benign disease  . Cholecystectomy    . Coronary artery bypass graft  02/2006  . Cervical cone biopsy     . Esophagogastroduodenoscopy N/A 07/22/2014    Procedure: ESOPHAGOGASTRODUODENOSCOPY (EGD);  Surgeon: [name], MD;  Location: [facility] ENDOSCOPY;  Service: Endoscopy;  Laterality: N/A;    Family History  Problem Relation Age of Onset  . Heart attack Father 40  . Lung cancer Mother 32    Social History:  reports that she has been smoking Cigarettes.  She started smoking about 46 years ago. She has a 50 pack-year smoking history. She has never used smokeless tobacco. She reports that she does not drink alcohol or use illicit drugs.  Allergies: No Known Allergies  Medications:  Scheduled Meds: . atorvastatin  80 mg Oral Daily  . lisinopril  20 mg Oral Daily   And  . hydrochlorothiazide  12.5 mg Oral Daily  . ipratropium  0.5 mg Nebulization QID  . [START ON 06/29/2015] isosorbide mononitrate  60 mg Oral Daily  . levalbuterol  0.63 mg Nebulization QID  . methylPREDNISolone (SOLU-MEDROL) injection  60 mg Intravenous Q6H  . metoprolol  75 mg Oral BID  . nicotine  21 mg Transdermal Daily   Continuous Infusions: . sodium chloride 10 mL/hr at 06/27/15 1706   PRN Meds:.acetaminophen, ondansetron **OR** ondansetron (ZOFRAN) IV   Results for orders placed or performed during the hospital encounter of 06/27/15 (from the past 48 hour(s))  CBC with Differential/Platelet     Status: Abnormal   Collection Time: 06/27/15 10:57 AM  Result Value Ref Range   WBC 7.8 4.0 - 10.5  K/uL   RBC 5.21 (H) 3.87 - 5.11 MIL/uL   Hemoglobin 15.9 (H) 12.0 - 15.0 g/dL   HCT 48.5 (H) 36.0 - 46.0 %   MCV 93.1 78.0 - 100.0 fL   MCH 30.5 26.0 - 34.0 pg   MCHC 32.8 30.0 - 36.0 g/dL   RDW 14.7 11.5 - 15.5 %   Platelets 169 150 - 400 K/uL   Neutrophils Relative % 66 %   Neutro Abs 5.0 1.7 - 7.7 K/uL   Lymphocytes Relative 18 %   Lymphs Abs 1.4 0.7 - 4.0 K/uL   Monocytes Relative 12 %   Monocytes Absolute 1.0 0.1 - 1.0 K/uL   Eosinophils Relative 4 %   Eosinophils Absolute 0.3 0.0 - 0.7 K/uL   Basophils  Relative 0 %   Basophils Absolute 0.0 0.0 - 0.1 K/uL  Basic metabolic panel     Status: Abnormal   Collection Time: 06/27/15 10:57 AM  Result Value Ref Range   Sodium 141 135 - 145 mmol/L   Potassium 3.8 3.5 - 5.1 mmol/L   Chloride 102 101 - 111 mmol/L   CO2 25 22 - 32 mmol/L   Glucose, Bld 109 (H) 65 - 99 mg/dL   BUN 17 6 - 20 mg/dL   Creatinine, Ser 0.92 0.44 - 1.00 mg/dL   Calcium 9.8 8.9 - 10.3 mg/dL   GFR calc non Af Amer >60 >60 mL/min   GFR calc Af Amer >60 >60 mL/min    Comment: (NOTE) The eGFR has been calculated using the CKD EPI equation. This calculation has not been validated in all clinical situations. eGFR's persistently <60 mL/min signify possible Chronic Kidney Disease.    Anion gap 14 5 - 15  Troponin I     Status: None   Collection Time: 06/27/15 10:57 AM  Result Value Ref Range   Troponin I <0.03 <0.031 ng/mL    Comment:        NO INDICATION OF MYOCARDIAL INJURY.   Troponin I (q 6hr x 3)     Status: Abnormal   Collection Time: 06/27/15  4:57 PM  Result Value Ref Range   Troponin I 0.10 (H) <0.031 ng/mL    Comment:        PERSISTENTLY INCREASED TROPONIN VALUES IN THE RANGE OF 0.04-0.49 ng/mL CAN BE SEEN IN:       -UNSTABLE ANGINA       -CONGESTIVE HEART FAILURE       -MYOCARDITIS       -CHEST TRAUMA       -ARRYHTHMIAS       -LATE PRESENTING MYOCARDIAL INFARCTION       -COPD   CLINICAL FOLLOW-UP RECOMMENDED.   Troponin I (q 6hr x 3)     Status: Abnormal   Collection Time: 06/27/15  9:34 PM  Result Value Ref Range   Troponin I 0.47 (H) <0.031 ng/mL    Comment:        PERSISTENTLY INCREASED TROPONIN VALUES IN THE RANGE OF 0.04-0.49 ng/mL CAN BE SEEN IN:       -UNSTABLE ANGINA       -CONGESTIVE HEART FAILURE       -MYOCARDITIS       -CHEST TRAUMA       -ARRYHTHMIAS       -LATE PRESENTING MYOCARDIAL INFARCTION       -COPD   CLINICAL FOLLOW-UP RECOMMENDED.   Troponin I (q 6hr x 3)     Status: Abnormal   Collection Time:  06/28/15  4:47 AM   Result Value Ref Range   Troponin I 1.65 (HH) <0.031 ng/mL    Comment: CRITICAL RESULT CALLED TO, READ BACK BY AND VERIFIED WITH: Junious Silk AT 6:15AM ON 06/28/15 BY FESTERMAN,C        POSSIBLE MYOCARDIAL ISCHEMIA. SERIAL TESTING RECOMMENDED.   CBC     Status: Abnormal   Collection Time: 06/28/15  4:47 AM  Result Value Ref Range   WBC 14.5 (H) 4.0 - 10.5 K/uL   RBC 4.17 3.87 - 5.11 MIL/uL   Hemoglobin 12.6 12.0 - 15.0 g/dL    Comment: DELTA CHECK NOTED RESULT REPEATED AND VERIFIED    HCT 39.0 36.0 - 46.0 %   MCV 93.5 78.0 - 100.0 fL   MCH 30.2 26.0 - 34.0 pg   MCHC 32.3 30.0 - 36.0 g/dL   RDW 14.7 11.5 - 15.5 %   Platelets 186 150 - 400 K/uL  Comprehensive metabolic panel     Status: Abnormal   Collection Time: 06/28/15  4:47 AM  Result Value Ref Range   Sodium 137 135 - 145 mmol/L   Potassium 4.0 3.5 - 5.1 mmol/L   Chloride 104 101 - 111 mmol/L   CO2 21 (L) 22 - 32 mmol/L   Glucose, Bld 164 (H) 65 - 99 mg/dL   BUN 28 (H) 6 - 20 mg/dL   Creatinine, Ser 1.34 (H) 0.44 - 1.00 mg/dL   Calcium 9.8 8.9 - 10.3 mg/dL   Total Protein 7.2 6.5 - 8.1 g/dL   Albumin 4.1 3.5 - 5.0 g/dL   AST 31 15 - 41 U/L   ALT 21 14 - 54 U/L   Alkaline Phosphatase 70 38 - 126 U/L   Total Bilirubin 0.5 0.3 - 1.2 mg/dL   GFR calc non Af Amer 41 (L) >60 mL/min   GFR calc Af Amer 48 (L) >60 mL/min    Comment: (NOTE) The eGFR has been calculated using the CKD EPI equation. This calculation has not been validated in all clinical situations. eGFR's persistently <60 mL/min signify possible Chronic Kidney Disease.    Anion gap 12 5 - 15    Dg Chest 2 View  06/27/2015  CLINICAL DATA:  60 year old with productive cough, congestion and shortness of breath for 5 days. History of COPD. EXAM: CHEST  2 VIEW COMPARISON:  03/21/2015. FINDINGS: The heart size and mediastinal contours are stable status post CABG. There is stable mild chronic lung disease with central airway thickening and mild hyperinflation. No  evidence of edema, airspace disease, pleural effusion or pneumothorax. The bones appear unchanged. IMPRESSION: Stable postoperative chest.  No acute cardiopulmonary process. Electronically Signed   By: Richardean Sale M.D.   On: 06/27/2015 11:18    Review of Systems  Constitutional: Negative.   HENT: Negative.   Eyes: Positive for pain.  Respiratory: Positive for cough, shortness of breath and wheezing.   Cardiovascular: Positive for chest pain.  Gastrointestinal: Positive for nausea, vomiting and abdominal pain.  Genitourinary: Negative.   Musculoskeletal: Negative.   Neurological: Negative.       Blood pressure 150/62, pulse 110, temperature 98.2 F (36.8 C), temperature source Oral, resp. rate 22, height 5' 2" (1.575 m), weight 189 lb (85.73 kg), SpO2 94 %. Physical Exam PHYSICAL EXAM: Well-nournished, in no acute distress. Neck: Bilateral carotid bruits vs murmur portrayed, No JVD, HJR, or thyroid enlargement Lungs: Decreased breath sounds with  Diffuse wheezing, no rales, or rhonchi Cardiovascular: RRR, PMI not CBJSEGBTD,1/7 harsh systolic murmur  LSB, nogallops, bruit, thrill, or heave. Abdomen: BS normal. Soft without organomegaly, masses, lesions or tenderness. Extremities: without cyanosis, clubbing or edema. Good distal pulses bilateral SKin: Warm, no lesions or rashes  Musculoskeletal: No deformities Neuro: no focal signs 12/2012 Echo   LVEF 60-65%, mild to mod LVH, normal diastolic function, mild AS (valve area 1.5 VTI, mean grad 10), RV function mildly reduced    07/2011 Carotid US   1. Bilateral carotid bifurcation and proximal ICA plaque, resulting in less than 50% diameter stenosis. The exam does not exclude plaque ulceration or embolization. Continued surveillance recommended.  02/2006 Cath   FINDINGS:   1. LV: 143/17/26. EF 65% without regional wall motion abnormality.   2. No aortic stenosis or mitral regurgitation.   3. Left main: There is an 80% stenosis of  the mid vessel.   4. LAD: Moderate-size vessel giving rise to two small proximal   diagonals and a larger third diagonal. The LAD has heavy   calcification proximally and diffuse mild disease. The third   diagonal has a 70% stenosis proximally. A small medial subdivision   of this has a 99% stenosis.   5. Circumflex: Moderate-sized vessel giving rise to a single   marginal. There is a 70% stenosis in the AV groove portion of the   vessel.   6. RCA: Moderate-size, dominant vessel. There is an 80% ostial   stenosis, a 40% stenosis of the mid vessel, and a 90% stenosis just   before the origin of the PDA.   7. Left subclavian artery: Diffuse 30% stenosis proximally. The LIMA   is widely patent.   8. Aortic coarctation with 30 mmHg translesional gradient.   9. Normal great vessel anatomy. There is mild ostial stenosis of the   left common carotid artery.   IMPRESSION/RECOMMENDATIONS:   1. Severe multivessel coronary disease.   2. Coarctation of aorta.   3. Normal left ventricular systolic function.   Will refer the to cardiac surgery for consideration of coronary artery   bypass grafting and repair of her coarctation.    Jan 2015 PFTs Reason for pulmonary function testing is shortness of breath.   1. Spirometry shows a moderate ventilatory defect with minimal airflow   obstruction.   2. Lung volumes show air trapping.   3. DLCO is normal.   4. Airway resistance is high confirming the presence of airflow   obstruction.   5. No significant bronchodilator improvement.   6. Arterial blood gas shows relative resting hypoxia.   7. This study is consistent with COPD considering the patient's   smoking history.  10/2013  Carotid US IMPRESSION: 1. Slight interval progression of left-sided heterogeneous atherosclerotic plaque which now results in an estimated 50- 69% diameter ICA stenosis. 2. Stable heterogeneous atherosclerotic plaque on the right resulting in less than 50% diameter ICA  stenosis. 3. Vertebral arteries are patent with normal antegrade flow.  01/2014 Lexiscan MPI IMPRESSION: 1. Abnormal study. There is evidence of apical anterior/anterolateral ischemia. Borderline TID ratio increased at 1.2 may also be indicative of subendocardial or balanced ischemia.   2. Normal left ventricular wall motion.   3. Left ventricular ejection fraction 62%   4. Intermediate-risk stress test findings*.  07/2014 echo Study Conclusions  - Left ventricle: The cavity size was normal. Wall thickness was   increased in a pattern of mild LVH. Systolic function was normal.   The estimated ejection fraction was in the range of 55% to 60%.   Wall motion was  normal; there were no regional wall motion   abnormalities. Doppler parameters are consistent with elevated   ventricular end-diastolic filling pressure. - Aortic valve: Valve area (VTI): 1.36 cm^2. Valve area (Vmax):   1.26 cm^2. Valve area (Vmean): 1.29 cm^2. - Mitral valve: There was mild regurgitation. Valve area by   continuity equation (using LVOT flow): 2.15 cm^2. - Left atrium: The atrium was mildly dilated. - Atrial septum: No defect or patent foramen ovale was identified.   Assessment/Plan: NSTEMI with positive troponins, abnormal EKG and prolonged chest pain yest. Suspect she will need cardiac catheterization. Discuss with MD. Begin IV heparin. Increase Imdur 60 mg daily.  CAD  CABG 2007 LIMA to LAD, SVG-OM, SVG to PDA, echo EF 60-65% 2014, 01/2014 exercise MPI didn't reach THR converted to [place]. Apical anterior and anterolateral ischemia, intermediate risk LVEF 62% started on Imdur 30 mg daily with improvement of symptoms.  COPD exacerbation improving with Solumedrol and nebulizers  Mild to Moderate AS echo 2014 see above for details.  Carotid disease last [place] 01/2015 1-39%  Tobacco abuse 1/2 ppd.  HTN: BP high today.  Renal insufficiency: normal yesterday, crt 1.34 today. Hydrate if cath.  History  of antral ulcers without bleed 07/2014    Ermalinda Barrios PA-C 06/28/2015, 11:00 AM    Attending note:  Patient seen and examined. Reviewed extensive records and discussed the case with Ms. Bonnell Public PA-C. Christina Boyle is a patient of Dr. Harl Bowie, recently seen in the office for routine follow-up. She has a history of multivessel CAD status post CABG in 2007, abnormal Cardiolite in 2015 showing apical anterior and anterolateral ischemia was managed medically. She also has moderate aortic stenosis by most recent echocardiogram. She is admitted to the hospital with chest tightness and bronchitis symptoms, reporting some cough and shortness of breath, no fevers or chills. She had a prolonged episode of chest tightness yesterday, did not use nitroglycerin at home. She states in retrospect that she has been having more frequent episodes of chest pain over the last few months. Cardiac enzymes have increased from less than 0.03 up to 1.65. ECG is chronically abnormal with diffuse but predominantly inferolateral ST segment abnormalities. Her chest x-ray shows no obvious acute process.  On examination this morning she appears comfortable. Heart rate 88-502, systolic blood pressure 774J. Lungs exhibit decreased breath sounds with prolonged expiratory phase but no active wheezing. Cardiac exam with RRR and 3/6 systolic murmur consistent with aortic stenosis. Lab work reveals creatinine 1.3 up to from 0.9, hemoglobin 12.6, platelet 186.  Enzymatic evidence of NSTEMI with recent progressive and more prolonged chest tightness consistent with angina. May also have associated bronchitis, but is not in any distress at this time and afebrile. She has known multivessel CAD status post CABG 10 years ago with intermediate risk abnormal Cardiolite in 2015 that has been managed medically. We have discussed cardiac catheterization with the patient including risks and benefits, and she is in agreement to proceed. We are arranging transfer  to [place] gently and follow-up creatinine in the morning. Continue aspirin, statin, beta blocker, nitrates, and Heparin for now. ACE inhibitor is on hold temporarily. She needs a follow-up echocardiogram to reevaluate aortic stenosis as well.  Satira Sark, M.D., F.A.C.C.

## 2015-06-28 NOTE — Progress Notes (Signed)
Pt. Waiting on bed at [facility] for cardiac catheter.

## 2015-06-28 NOTE — Progress Notes (Signed)
Subjective: She says she feels okay. She does not have any chest pain now. Her breathing is better.  Objective: Vital signs in last 24 hours: Temp:  [97.5 F (36.4 C)-98.2 F (36.8 C)] 98.2 F (36.8 C) (04/18 2115) Pulse Rate:  [85-122] 110 (04/18 2115) Resp:  [18-24] 22 (04/18 2115) BP: (133-207)/(49-78) 150/62 mmHg (04/18 2115) SpO2:  [89 %-99 %] 91 % (04/19 0133) Weight:  [85.73 kg (189 lb)-87.998 kg (194 lb)] 85.73 kg (189 lb) (04/18 1735) Weight change:  Last BM Date: 06/26/15  Intake/Output from previous day:    PHYSICAL EXAM General appearance: alert, cooperative and no distress Resp: wheezes bilaterally Cardio: regular rate and rhythm, S1, S2 normal, no murmur, click, rub or gallop GI: soft, non-tender; bowel sounds normal; no masses,  no organomegaly Extremities: extremities normal, atraumatic, no cyanosis or edema  Lab Results:  Results for orders placed or performed during the hospital encounter of 06/27/15 (from the past 48 hour(s))  CBC with Differential/Platelet     Status: Abnormal   Collection Time: 06/27/15 10:57 AM  Result Value Ref Range   WBC 7.8 4.0 - 10.5 K/uL   RBC 5.21 (H) 3.87 - 5.11 MIL/uL   Hemoglobin 15.9 (H) 12.0 - 15.0 g/dL   HCT 48.5 (H) 36.0 - 46.0 %   MCV 93.1 78.0 - 100.0 fL   MCH 30.5 26.0 - 34.0 pg   MCHC 32.8 30.0 - 36.0 g/dL   RDW 14.7 11.5 - 15.5 %   Platelets 169 150 - 400 K/uL   Neutrophils Relative % 66 %   Neutro Abs 5.0 1.7 - 7.7 K/uL   Lymphocytes Relative 18 %   Lymphs Abs 1.4 0.7 - 4.0 K/uL   Monocytes Relative 12 %   Monocytes Absolute 1.0 0.1 - 1.0 K/uL   Eosinophils Relative 4 %   Eosinophils Absolute 0.3 0.0 - 0.7 K/uL   Basophils Relative 0 %   Basophils Absolute 0.0 0.0 - 0.1 K/uL  Basic metabolic panel     Status: Abnormal   Collection Time: 06/27/15 10:57 AM  Result Value Ref Range   Sodium 141 135 - 145 mmol/L   Potassium 3.8 3.5 - 5.1 mmol/L   Chloride 102 101 - 111 mmol/L   CO2 25 22 - 32 mmol/L   Glucose, Bld 109 (H) 65 - 99 mg/dL   BUN 17 6 - 20 mg/dL   Creatinine, Ser 0.92 0.44 - 1.00 mg/dL   Calcium 9.8 8.9 - 10.3 mg/dL   GFR calc non Af Amer >60 >60 mL/min   GFR calc Af Amer >60 >60 mL/min    Comment: (NOTE) The eGFR has been calculated using the CKD EPI equation. This calculation has not been validated in all clinical situations. eGFR's persistently <60 mL/min signify possible Chronic Kidney Disease.    Anion gap 14 5 - 15  Troponin I     Status: None   Collection Time: 06/27/15 10:57 AM  Result Value Ref Range   Troponin I <0.03 <0.031 ng/mL    Comment:        NO INDICATION OF MYOCARDIAL INJURY.   Troponin I (q 6hr x 3)     Status: Abnormal   Collection Time: 06/27/15  4:57 PM  Result Value Ref Range   Troponin I 0.10 (H) <0.031 ng/mL    Comment:        PERSISTENTLY INCREASED TROPONIN VALUES IN THE RANGE OF 0.04-0.49 ng/mL CAN BE SEEN IN:       -UNSTABLE  ANGINA       -CONGESTIVE HEART FAILURE       -MYOCARDITIS       -CHEST TRAUMA       -ARRYHTHMIAS       -LATE PRESENTING MYOCARDIAL INFARCTION       -COPD   CLINICAL FOLLOW-UP RECOMMENDED.   Troponin I (q 6hr x 3)     Status: Abnormal   Collection Time: 06/27/15  9:34 PM  Result Value Ref Range   Troponin I 0.47 (H) <0.031 ng/mL    Comment:        PERSISTENTLY INCREASED TROPONIN VALUES IN THE RANGE OF 0.04-0.49 ng/mL CAN BE SEEN IN:       -UNSTABLE ANGINA       -CONGESTIVE HEART FAILURE       -MYOCARDITIS       -CHEST TRAUMA       -ARRYHTHMIAS       -LATE PRESENTING MYOCARDIAL INFARCTION       -COPD   CLINICAL FOLLOW-UP RECOMMENDED.   Troponin I (q 6hr x 3)     Status: Abnormal   Collection Time: 06/28/15  4:47 AM  Result Value Ref Range   Troponin I 1.65 (HH) <0.031 ng/mL    Comment: CRITICAL RESULT CALLED TO, READ BACK BY AND VERIFIED WITH: Junious Silk AT 6:15AM ON 06/28/15 BY FESTERMAN,C        POSSIBLE MYOCARDIAL ISCHEMIA. SERIAL TESTING RECOMMENDED.   CBC     Status: Abnormal    Collection Time: 06/28/15  4:47 AM  Result Value Ref Range   WBC 14.5 (H) 4.0 - 10.5 K/uL   RBC 4.17 3.87 - 5.11 MIL/uL   Hemoglobin 12.6 12.0 - 15.0 g/dL    Comment: DELTA CHECK NOTED RESULT REPEATED AND VERIFIED    HCT 39.0 36.0 - 46.0 %   MCV 93.5 78.0 - 100.0 fL   MCH 30.2 26.0 - 34.0 pg   MCHC 32.3 30.0 - 36.0 g/dL   RDW 14.7 11.5 - 15.5 %   Platelets 186 150 - 400 K/uL  Comprehensive metabolic panel     Status: Abnormal   Collection Time: 06/28/15  4:47 AM  Result Value Ref Range   Sodium 137 135 - 145 mmol/L   Potassium 4.0 3.5 - 5.1 mmol/L   Chloride 104 101 - 111 mmol/L   CO2 21 (L) 22 - 32 mmol/L   Glucose, Bld 164 (H) 65 - 99 mg/dL   BUN 28 (H) 6 - 20 mg/dL   Creatinine, Ser 1.34 (H) 0.44 - 1.00 mg/dL   Calcium 9.8 8.9 - 10.3 mg/dL   Total Protein 7.2 6.5 - 8.1 g/dL   Albumin 4.1 3.5 - 5.0 g/dL   AST 31 15 - 41 U/L   ALT 21 14 - 54 U/L   Alkaline Phosphatase 70 38 - 126 U/L   Total Bilirubin 0.5 0.3 - 1.2 mg/dL   GFR calc non Af Amer 41 (L) >60 mL/min   GFR calc Af Amer 48 (L) >60 mL/min    Comment: (NOTE) The eGFR has been calculated using the CKD EPI equation. This calculation has not been validated in all clinical situations. eGFR's persistently <60 mL/min signify possible Chronic Kidney Disease.    Anion gap 12 5 - 15    ABGS No results for input(s): PHART, PO2ART, TCO2, HCO3 in the last 72 hours.  Invalid input(s): PCO2 CULTURES No results found for this or any previous visit (from the past 240 hour(s)). Studies/Results: Dg Chest 2  View  06/27/2015  CLINICAL DATA:  60 year old with productive cough, congestion and shortness of breath for 5 days. History of COPD. EXAM: CHEST  2 VIEW COMPARISON:  03/21/2015. FINDINGS: The heart size and mediastinal contours are stable status post CABG. There is stable mild chronic lung disease with central airway thickening and mild hyperinflation. No evidence of edema, airspace disease, pleural effusion or  pneumothorax. The bones appear unchanged. IMPRESSION: Stable postoperative chest.  No acute cardiopulmonary process. Electronically Signed   By: Richardean Sale M.D.   On: 06/27/2015 11:18    Medications:  Prior to Admission:  Prescriptions prior to admission  Medication Sig Dispense Refill Last Dose  . aspirin EC 81 MG EC tablet Take 1 tablet (81 mg total) by mouth daily.   06/26/2015 at Unknown time  . atorvastatin (LIPITOR) 80 MG tablet TAKE ONE TABLET BY MOUTH ONCE DAILY 90 tablet 3 06/26/2015 at Unknown time  . doxycycline (VIBRAMYCIN) 100 MG capsule Take 100 mg by mouth 2 (two) times daily as needed (for symptoms).   06/25/2015 at Unknown time  . isosorbide mononitrate (IMDUR) 30 MG 24 hr tablet TAKE ONE TABLET BY MOUTH ONCE DAILY 30 tablet 6 06/26/2015 at Unknown time  . lisinopril-hydrochlorothiazide (PRINZIDE,ZESTORETIC) 20-12.5 MG tablet Take 2 tablets daily (Patient taking differently: Take 1 tablet by mouth daily. ) 60 tablet 3 06/26/2015 at Unknown time  . metoprolol (LOPRESSOR) 50 MG tablet TAKE ONE & ONE-HALF TABLETS BY MOUTH TWICE DAILY 90 tablet 6 06/26/2015 at [place]  . nitroGLYCERIN (NITROSTAT) 0.4 MG SL tablet Place 1 tablet (0.4 mg total) under the tongue every 5 (five) minutes as needed for chest pain. 25 tablet 3 unknown  . Tiotropium Bromide Monohydrate (SPIRIVA RESPIMAT) 2.5 MCG/ACT AERS Inhale 1 puff into the lungs as needed.    06/26/2015 at Unknown time  . acetaminophen (TYLENOL) 500 MG tablet Take 1,000 mg by mouth every 6 (six) hours as needed for moderate pain.   Taking  . albuterol (PROVENTIL HFA;VENTOLIN HFA) 108 (90 Base) MCG/ACT inhaler Inhale 1-2 puffs into the lungs every 6 (six) hours as needed for wheezing or shortness of breath.   Taking  . nicotine (NICODERM CQ) 14 mg/24hr patch PLACE 1 14MG PATCH DAILY FOR 2 WEEKS 14 patch 0   . nicotine (NICODERM CQ) 21 mg/24hr patch PLACE 1 21 MG PATCH DAILY FOR 6 WEEKS 42 patch 0   . nicotine (NICODERM CQ) 7 mg/24hr patch PLACE  1 7 MG PATCH DAILY FOR 2 WEEKS 14 patch 0    Scheduled: . aspirin EC  81 mg Oral Daily  . atorvastatin  80 mg Oral Daily  . enoxaparin (LOVENOX) injection  40 mg Subcutaneous Q24H  . lisinopril  20 mg Oral Daily   And  . hydrochlorothiazide  12.5 mg Oral Daily  . ipratropium  0.5 mg Nebulization QID  . isosorbide mononitrate  30 mg Oral Daily  . levalbuterol  0.63 mg Nebulization QID  . methylPREDNISolone (SOLU-MEDROL) injection  60 mg Intravenous Q6H  . metoprolol  75 mg Oral BID  . nicotine  21 mg Transdermal Daily   Continuous: . sodium chloride 10 mL/hr at 06/27/15 1706   INO:[id number], ondansetron **OR** ondansetron (ZOFRAN) IV  Assesment: She was admitted with chest pain. This was felt to be COPD exacerbation but she has had a steadily climbing troponin level. This started at 0.1 then went to 0.4 and now is 1.6. She does have a previous history of cardiac disease. Her COPD exacerbation seems better  Active Problems:   Hyperlipemia   Hypertension   COPD exacerbation [facility])    Plan: Cardiology consultation. Will get an echocardiogram.      Adriell Polansky L 06/28/2015, 9:15 AM

## 2015-06-28 NOTE — Progress Notes (Signed)
Transferred to [facility] [id number] by [facility] in stable condition with oxygen and monitor, reported to Lia Foyer, RN at [facility].

## 2015-06-29 ENCOUNTER — Encounter: Payer: Self-pay | Admitting: *Deleted

## 2015-06-29 ENCOUNTER — Encounter (HOSPITAL_COMMUNITY): Payer: Self-pay | Admitting: Cardiology

## 2015-06-29 DIAGNOSIS — Q251 Coarctation of aorta: Secondary | ICD-10-CM

## 2015-06-29 DIAGNOSIS — N179 Acute kidney failure, unspecified: Secondary | ICD-10-CM

## 2015-06-29 DIAGNOSIS — I214 Non-ST elevation (NSTEMI) myocardial infarction: Secondary | ICD-10-CM | POA: Diagnosis present

## 2015-06-29 DIAGNOSIS — J439 Emphysema, unspecified: Secondary | ICD-10-CM

## 2015-06-29 LAB — BASIC METABOLIC PANEL
ANION GAP: 12 (ref 5–15)
BUN: 36 mg/dL — AB (ref 6–20)
CHLORIDE: 103 mmol/L (ref 101–111)
CO2: 21 mmol/L — ABNORMAL LOW (ref 22–32)
Calcium: 9.7 mg/dL (ref 8.9–10.3)
Creatinine, Ser: 1.58 mg/dL — ABNORMAL HIGH (ref 0.44–1.00)
GFR calc Af Amer: 39 mL/min — ABNORMAL LOW (ref >60)
GFR, EST NON AFRICAN AMERICAN: 34 mL/min — AB (ref >60)
Glucose, Bld: 165 mg/dL — ABNORMAL HIGH (ref 65–99)
POTASSIUM: 4.4 mmol/L (ref 3.5–5.1)
SODIUM: 136 mmol/L (ref 135–145)

## 2015-06-29 LAB — MRSA PCR SCREENING: MRSA by PCR: NEGATIVE

## 2015-06-29 LAB — HEPARIN LEVEL (UNFRACTIONATED): HEPARIN UNFRACTIONATED: 0.38 [IU]/mL (ref 0.30–0.70)

## 2015-06-29 MED ORDER — SODIUM CHLORIDE 0.9 % IV SOLN
250.0000 mL | INTRAVENOUS | Status: DC | PRN
Start: 2015-06-29 — End: 2015-06-30

## 2015-06-29 MED ORDER — SODIUM CHLORIDE 0.9% FLUSH: 3.0000 mL | Freq: Two times a day (BID) | INTRAVENOUS | Status: DC

## 2015-06-29 MED ORDER — ASPIRIN 81 MG PO CHEW
81.0000 mg | CHEWABLE_TABLET | ORAL | Status: AC
  Administered 2015-06-30: 07:00:00 81 mg via ORAL
  Filled 2015-06-29: qty 1

## 2015-06-29 MED ORDER — LEVALBUTEROL HCL 0.63 MG/3ML IN NEBU
0.6300 mg | INHALATION_SOLUTION | Freq: Three times a day (TID) | RESPIRATORY_TRACT | Status: DC
  Administered 2015-06-29 – 2015-07-04 (×12): 0.63 mg via RESPIRATORY_TRACT
  Filled 2015-06-29 (×15): qty 3

## 2015-06-29 MED ORDER — SODIUM CHLORIDE 0.9 % IV SOLN: INTRAVENOUS | Status: DC

## 2015-06-29 MED ORDER — IPRATROPIUM BROMIDE 0.02 % IN SOLN
0.5000 mg | Freq: Three times a day (TID) | RESPIRATORY_TRACT | Status: DC
  Administered 2015-06-29 – 2015-07-04 (×12): 0.5 mg via RESPIRATORY_TRACT
  Filled 2015-06-29 (×15): qty 2.5

## 2015-06-29 MED ORDER — PHENOL 1.4 % MT LIQD
1.0000 | OROMUCOSAL | Status: DC | PRN
Start: 2015-06-29 — End: 2015-07-04
  Administered 2015-06-30: 1 via OROMUCOSAL
  Filled 2015-06-29 (×2): qty 177

## 2015-06-29 MED ORDER — HYDRALAZINE HCL 20 MG/ML IJ SOLN
10.0000 mg | Freq: Four times a day (QID) | INTRAMUSCULAR | Status: DC | PRN
  Administered 2015-06-30 (×2): 10 mg via INTRAVENOUS
  Filled 2015-06-29 (×2): qty 1

## 2015-06-29 MED ORDER — SODIUM CHLORIDE 0.9% FLUSH: 3.0000 mL | INTRAVENOUS | Status: DC | PRN

## 2015-06-29 NOTE — Progress Notes (Signed)
ANTICOAGULATION CONSULT NOTE - Follow Up Consult  Pharmacy Consult:  Heparin Indication:  ACS  No Known Allergies  Patient Measurements: Height: 5\' 2"  (157.5 cm) Weight: 194 lb 14.2 oz (88.4 kg) IBW/kg (Calculated) : 50.1 Heparin Dosing Weight: 70 kg  Vital Signs: Temp: 97.4 F (36.3 C) (04/20 0730) Temp Source: Oral (04/20 0730) BP: 175/45 mmHg (04/20 0600) Pulse Rate: 72 (04/20 0730)  Labs:  Recent Labs  06/27/15 1057 06/27/15 1657 06/27/15 2134 06/28/15 0447 06/28/15 1159 06/28/15 1722 06/29/15 0748  HGB 15.9*  --   --  12.6  --   --   --   HCT 48.5*  --   --  39.0  --   --   --   PLT 169  --   --  186  --   --   --   LABPROT  --   --   --   --  14.1  --   --   INR  --   --   --   --  1.07  --   --   HEPARINUNFRC  --   --   --   --   --  0.58  --   CREATININE 0.92  --   --  1.34*  --   --  1.58*  TROPONINI <0.03 0.10* 0.47* 1.65*  --   --   --     Estimated Creatinine Clearance: 38.1 mL/min (by C-G formula based on Cr of 1.58).      Assessment: 57 YOF with ACS transferred from [facility] for further management.  She continues on IV heparin.  Daily heparin level and CBC were not drawn this AM.  RN reported that her heparin infusion has been off for cath; however, cath was postponed until the attending MD evaluates the patient given a rise in creatinine.  RN to resume heparin now.  No bleeding reported.   Goal of Therapy:  Heparin level 0.3-0.7 units/ml Monitor platelets by anticoagulation protocol: Yes    Plan:  - Continue heparin gtt at 850 units/hr - Check 6 hr HL vs f/u post cath - Daily HL / CBC    Martika Egler D. Mina Marble, PharmD, BCPS Pager:  [phone] 06/29/2015, 9:34 AM

## 2015-06-29 NOTE — Progress Notes (Addendum)
Patient Name: Christina Boyle Date of Encounter: 06/29/2015  Active Problems:   Hyperlipemia   Hypertension   COPD exacerbation [facility])   Primary Cardiologist: Dr. Harl Bowie Patient Profile:   SUBJECTIVE: Christina Boyle is an 60 y.o. female with multivessel CAD status post CABG in 2007, HLD, HTN, COPD, and current smoker. Patient was seen yesterday at [facility] with complaints of feeling bad for 5 days - coughing like bronchitis, head hurting, nausea, vomiting. Next had left chest tightness with left jaw pain lasted several hours yesterday. Didn't think to use a NTG. She has chest tightness several times a month, and states that it is getting more frequent. Occurs at rest or with exertion. Planned for cath., she was transferred to [facility].   OBJECTIVE Filed Vitals:   06/29/15 0200 06/29/15 0600 06/29/15 0730 06/29/15 0745  BP: 185/29 175/45    Pulse: 64 64 72   Temp:  97.3 F (36.3 C) 97.4 F (36.3 C)   TempSrc:  Oral Oral   Resp: 17 18    Height:      Weight:  194 lb 14.2 oz (88.4 kg)    SpO2: 93% 93% 92% 89%    Intake/Output Summary (Last 24 hours) at 06/29/15 0926 Last data filed at 06/29/15 0630  Gross per 24 hour  Intake 563.38 ml  Output   1800 ml  Net -1236.62 ml   Filed Weights   06/27/15 1735 06/28/15 2138 06/29/15 0600  Weight: 189 lb (85.73 kg) 194 lb 14.2 oz (88.4 kg) 194 lb 14.2 oz (88.4 kg)    PHYSICAL EXAM General: Well developed, well nourished, female in no acute distress. Head: Normocephalic, atraumatic.  Neck: Supple without bruits, JVD. Lungs:  Resp regular and unlabored, CTA. Heart: RRR, S1, S2, no S3, S4, or murmur; no rub. Abdomen: Soft, non-tender, non-distended, BS + x 4.  Extremities: No clubbing, cyanosis, edema.  Neuro: Alert and oriented X 3. Moves all extremities spontaneously. Psych: Normal affect.  LABS: CBC: Recent Labs  06/27/15 1057 06/28/15 0447  WBC 7.8 14.5*  NEUTROABS 5.0  --   HGB 15.9* 12.6  HCT 48.5* 39.0  MCV  93.1 93.5  PLT 169 186   INR: Recent Labs  06/28/15 1159  INR [id number]   Basic Metabolic Panel: Recent Labs  06/28/15 0447 06/29/15 0748  NA 137 136  K 4.0 4.4  CL 104 103  CO2 21* 21*  GLUCOSE 164* 165*  BUN 28* 36*  CREATININE 1.34* 1.58*  CALCIUM 9.8 9.7   Liver Function Tests: Recent Labs  06/28/15 0447  AST 31  ALT 21  ALKPHOS 70  BILITOT 0.5  PROT 7.2  ALBUMIN 4.1   Cardiac Enzymes: Recent Labs  06/27/15 1657 06/27/15 2134 06/28/15 0447  TROPONINI 0.10* 0.47* 1.65*    Current facility-administered medications:  .  0.9 %  sodium chloride infusion, , Intravenous, Continuous, Oswald Hillock, MD, Stopped at 06/28/15 2145 .  0.9 %  sodium chloride infusion, 250 mL, Intravenous, PRN, Imogene Burn, PA-C .  0.9 %  sodium chloride infusion, , Intravenous, Continuous, Imogene Burn, PA-C, Last Rate: 50 mL/hr at 06/29/15 0600 .  acetaminophen (TYLENOL) tablet 1,000 mg, 1,000 mg, Oral, Q6H PRN, Oswald Hillock, MD, 1,000 mg at 06/27/15 2239 .  antiseptic oral rinse (CPC / CETYLPYRIDINIUM CHLORIDE 0.05%) solution 7 mL, 7 mL, Mouth Rinse, BID, Sinda Du, MD, 7 mL at 06/29/15 0148 .  atorvastatin (LIPITOR) tablet 80 mg, 80 mg, Oral,  Daily, Oswald Hillock, MD, 80 mg at 06/28/15 0824 .  [COMPLETED] heparin bolus via infusion 4,000 Units, 4,000 Units, Intravenous, Once, 4,000 Units at 06/28/15 1215 **FOLLOWED BY** heparin ADULT infusion 100 units/mL (25000 units/250 mL), 850 Units/hr, Intravenous, Continuous, Sinda Du, MD, Last Rate: 8.5 mL/hr at 06/29/15 0600, 850 Units/hr at 06/29/15 0600 .  lisinopril (PRINIVIL,ZESTRIL) tablet 20 mg, 20 mg, Oral, Daily, 20 mg at 06/29/15 0910 **AND** hydrochlorothiazide (MICROZIDE) capsule 12.5 mg, 12.5 mg, Oral, Daily, Sinda Du, MD, 12.5 mg at 06/28/15 0824 .  ipratropium (ATROVENT) nebulizer solution 0.5 mg, 0.5 mg, Nebulization, QID, Oswald Hillock, MD, 0.5 mg at 06/29/15 0745 .  isosorbide mononitrate (IMDUR) 24 hr tablet 60  mg, 60 mg, Oral, Daily, Imogene Burn, PA-C, 60 mg at 06/29/15 0910 .  levalbuterol (XOPENEX) nebulizer solution 0.63 mg, 0.63 mg, Nebulization, QID, Oswald Hillock, MD, 0.63 mg at 06/29/15 0745 .  Melatonin TABS 3 mg, 3 mg, Oral, QHS PRN, Sinda Du, MD, 3 mg at 06/29/15 0147 .  methylPREDNISolone sodium succinate (SOLU-MEDROL) 125 mg/2 mL injection 60 mg, 60 mg, Intravenous, Q6H, Oswald Hillock, MD, 60 mg at 06/29/15 0605 .  metoprolol tartrate (LOPRESSOR) tablet 75 mg, 75 mg, Oral, BID, Oswald Hillock, MD, 75 mg at 06/29/15 0910 .  nicotine (NICODERM CQ - dosed in mg/24 hours) patch 21 mg, 21 mg, Transdermal, Daily, Oswald Hillock, MD, 21 mg at 06/29/15 0914 .  ondansetron (ZOFRAN) tablet 4 mg, 4 mg, Oral, Q6H PRN **OR** ondansetron (ZOFRAN) injection 4 mg, 4 mg, Intravenous, Q6H PRN, Oswald Hillock, MD .  phenol (CHLORASEPTIC) mouth spray 1 spray, 1 spray, Mouth/Throat, PRN, Sinda Du, MD .  sodium chloride flush (NS) 0.9 % injection 3 mL, 3 mL, Intravenous, Q12H, Imogene Burn, PA-C, 3 mL at 06/29/15 0148 .  sodium chloride flush (NS) 0.9 % injection 3 mL, 3 mL, Intravenous, PRN, Imogene Burn, PA-C . sodium chloride Stopped (06/28/15 2145)  . sodium chloride 50 mL/hr at 06/29/15 0600  . heparin 850 Units/hr (06/29/15 0600)    TELE:  NSR      ECG: NSR, T wave inversion V1-V6.   Radiology/Studies: Dg Chest 2 View  06/27/2015  CLINICAL DATA:  60 year old with productive cough, congestion and shortness of breath for 5 days. History of COPD. EXAM: CHEST  2 VIEW COMPARISON:  03/21/2015. FINDINGS: The heart size and mediastinal contours are stable status post CABG. There is stable mild chronic lung disease with central airway thickening and mild hyperinflation. No evidence of edema, airspace disease, pleural effusion or pneumothorax. The bones appear unchanged. IMPRESSION: Stable postoperative chest.  No acute cardiopulmonary process. Electronically Signed   By: Richardean Sale M.D.   On:  06/27/2015 11:18     Current Medications:  . antiseptic oral rinse  7 mL Mouth Rinse BID  . atorvastatin  80 mg Oral Daily  . lisinopril  20 mg Oral Daily   And  . hydrochlorothiazide  12.5 mg Oral Daily  . ipratropium  0.5 mg Nebulization QID  . isosorbide mononitrate  60 mg Oral Daily  . levalbuterol  0.63 mg Nebulization QID  . methylPREDNISolone (SOLU-MEDROL) injection  60 mg Intravenous Q6H  . metoprolol  75 mg Oral BID  . nicotine  21 mg Transdermal Daily  . sodium chloride flush  3 mL Intravenous Q12H   . sodium chloride Stopped (06/28/15 2145)  . sodium chloride 50 mL/hr at 06/29/15 0600  . heparin 850 Units/hr (06/29/15  0600)    ASSESSMENT AND PLAN: Active Problems:   Hyperlipemia   Hypertension   COPD exacerbation ([place])  1. NSTEMI: Troponin peaked at 1.65, EKG showed LVH and t wave inversion in leads V1-V6. Plan is for cath today, however creatinine is elevated from yesterday, so cath will be delayed. Her ACEI will be discontinued and we will hydrate her.   2. HLD: Last LDL was 122.  She is on high intensity statin.   3. COPD: lung sounds diminished and with scattered wheezing, continue nebulizer treatments.  She is a current smoker.    Signed, Arbutus Leas , NP 9:26 AM 06/29/2015 Pager [phone] The patient has been seen in conjunction with Jettie Booze, NP. All aspects of care have been considered and discussed. The patient has been personally interviewed, examined, and all clinical data has been reviewed.   The patient transferred in late last evening from [facility] with non-ST elevation myocardial infarction.  Was to have catheterization performed today but there is an acute increase in creatinine.  We will delay cath unless the patient becomes unstable, hydrated overnight, hold nephrotoxins (ACE inhibitor therapy/nonsteroidal therapy), and plan angiography in 24 hours.  Check basic metabolic panel in a.m. Will posterior catheterization tomorrow. The  patient was counseled to undergo left heart catheterization, coronary angiography, and possible percutaneous coronary intervention with stent implantation. The procedural risks and benefits were discussed in detail. The risks discussed included death, stroke, myocardial infarction, life-threatening bleeding, limb ischemia, kidney injury, allergy, and possible emergency cardiac surgery. The risk of these significant complications were estimated to occur less than 1% of the time. After discussion, the patient has agreed to proceed.

## 2015-06-29 NOTE — Progress Notes (Signed)
ANTICOAGULATION CONSULT NOTE - Follow Up Consult  Pharmacy Consult:  Heparin Indication:  ACS  No Known Allergies  Patient Measurements: Height: 5\' 2"  (157.5 cm) Weight: 194 lb 14.2 oz (88.4 kg) IBW/kg (Calculated) : 50.1 Heparin Dosing Weight: 70 kg  Vital Signs: Temp: 97.7 F (36.5 C) (04/20 1606) Temp Source: Oral (04/20 1606) BP: 137/36 mmHg (04/20 1606) Pulse Rate: 66 (04/20 1606)  Labs:  Recent Labs  06/27/15 1057 06/27/15 1657 06/27/15 2134 06/28/15 0447 06/28/15 1159 06/28/15 1722 06/29/15 0748 06/29/15 1528  HGB 15.9*  --   --  12.6  --   --   --   --   HCT 48.5*  --   --  39.0  --   --   --   --   PLT 169  --   --  186  --   --   --   --   LABPROT  --   --   --   --  14.1  --   --   --   INR  --   --   --   --  1.07  --   --   --   HEPARINUNFRC  --   --   --   --   --  0.58  --  0.38  CREATININE 0.92  --   --  1.34*  --   --  1.58*  --   TROPONINI <0.03 0.10* 0.47* 1.65*  --   --   --   --     Estimated Creatinine Clearance: 38.1 mL/min (by C-G formula based on Cr of 1.58).    Assessment: 24 YOF with ACS transferred from [facility] for further management.  She continues on IV heparin.  Daily heparin level and CBC were not drawn this AM.  RN reported that her heparin infusion has been off for cath; however, cath was postponed until the attending MD evaluates the patient given a rise in creatinine.  RN to resume heparin now.  No bleeding reported.   PM heparin level therapeutic at 0.38  Goal of Therapy:  Heparin level 0.3-0.7 units/ml Monitor platelets by anticoagulation protocol: Yes    Plan:  - Continue heparin gtt at 850 units/hr - Daily HL / CBC  Thank you Anette Guarneri, PharmD [phone]  06/29/2015, 4:21 PM

## 2015-06-30 ENCOUNTER — Encounter (HOSPITAL_COMMUNITY)
Admission: EM | Disposition: A | Discharge status: Home / Self Care | Payer: Self-pay | Source: Home / Self Care | Attending: Cardiovascular Disease

## 2015-06-30 DIAGNOSIS — N289 Disorder of kidney and ureter, unspecified: Secondary | ICD-10-CM

## 2015-06-30 DIAGNOSIS — E669 Obesity, unspecified: Secondary | ICD-10-CM | POA: Diagnosis present

## 2015-06-30 DIAGNOSIS — I119 Hypertensive heart disease without heart failure: Secondary | ICD-10-CM | POA: Diagnosis present

## 2015-06-30 DIAGNOSIS — Z951 Presence of aortocoronary bypass graft: Secondary | ICD-10-CM

## 2015-06-30 DIAGNOSIS — I350 Nonrheumatic aortic (valve) stenosis: Secondary | ICD-10-CM

## 2015-06-30 DIAGNOSIS — I25110 Atherosclerotic heart disease of native coronary artery with unstable angina pectoris: Secondary | ICD-10-CM | POA: Insufficient documentation

## 2015-06-30 HISTORY — PX: CARDIAC CATHETERIZATION: SHX172

## 2015-06-30 LAB — BASIC METABOLIC PANEL
ANION GAP: 9 (ref 5–15)
BUN: 35 mg/dL — AB (ref 6–20)
CHLORIDE: 106 mmol/L (ref 101–111)
CO2: 22 mmol/L (ref 22–32)
Calcium: 9.1 mg/dL (ref 8.9–10.3)
Creatinine, Ser: 1.2 mg/dL — ABNORMAL HIGH (ref 0.44–1.00)
GFR calc Af Amer: 55 mL/min — ABNORMAL LOW (ref >60)
GFR, EST NON AFRICAN AMERICAN: 47 mL/min — AB (ref >60)
Glucose, Bld: 164 mg/dL — ABNORMAL HIGH (ref 65–99)
POTASSIUM: 3.7 mmol/L (ref 3.5–5.1)
SODIUM: 137 mmol/L (ref 135–145)

## 2015-06-30 LAB — CBC
HCT: 35.1 % — ABNORMAL LOW (ref 36.0–46.0)
HEMOGLOBIN: 11.3 g/dL — AB (ref 12.0–15.0)
MCH: 30.1 pg (ref 26.0–34.0)
MCHC: 32.2 g/dL (ref 30.0–36.0)
MCV: 93.4 fL (ref 78.0–100.0)
Platelets: 168 10*3/uL (ref 150–400)
RBC: 3.76 MIL/uL — AB (ref 3.87–5.11)
RDW: 14.8 % (ref 11.5–15.5)
WBC: 17 10*3/uL — AB (ref 4.0–10.5)

## 2015-06-30 LAB — HEPARIN LEVEL (UNFRACTIONATED): Heparin Unfractionated: 0.58 IU/mL (ref 0.30–0.70)

## 2015-06-30 SURGERY — LEFT HEART CATH AND CORS/GRAFTS ANGIOGRAPHY

## 2015-06-30 MED ORDER — SODIUM CHLORIDE 0.9% FLUSH: 3.0000 mL | INTRAVENOUS | Status: DC | PRN

## 2015-06-30 MED ORDER — ALPRAZOLAM 0.5 MG PO TABS
0.5000 mg | ORAL_TABLET | Freq: Three times a day (TID) | ORAL | Status: DC | PRN
  Administered 2015-06-30 – 2015-07-03 (×5): 0.5 mg via ORAL
  Filled 2015-06-30 (×5): qty 1

## 2015-06-30 MED ORDER — SODIUM CHLORIDE 0.9% FLUSH
3.0000 mL | Freq: Two times a day (BID) | INTRAVENOUS | Status: DC
  Administered 2015-07-01 – 2015-07-03 (×4): 3 mL via INTRAVENOUS

## 2015-06-30 MED ORDER — LIDOCAINE HCL (PF) 1 % IJ SOLN
INTRAMUSCULAR | Status: DC | PRN
  Administered 2015-06-30: 4 mL

## 2015-06-30 MED ORDER — IOPAMIDOL (ISOVUE-370) INJECTION 76%
INTRAVENOUS | Status: AC
  Filled 2015-06-30: qty 125

## 2015-06-30 MED ORDER — FENTANYL CITRATE (PF) 100 MCG/2ML IJ SOLN
INTRAMUSCULAR | Status: AC
  Filled 2015-06-30: qty 2

## 2015-06-30 MED ORDER — HEPARIN SODIUM (PORCINE) 1000 UNIT/ML IJ SOLN
INTRAMUSCULAR | Status: DC | PRN
  Administered 2015-06-30: 6000 [IU] via INTRAVENOUS

## 2015-06-30 MED ORDER — LIDOCAINE HCL (PF) 1 % IJ SOLN
INTRAMUSCULAR | Status: AC
  Filled 2015-06-30: qty 30

## 2015-06-30 MED ORDER — CLOPIDOGREL BISULFATE 75 MG PO TABS
300.0000 mg | ORAL_TABLET | Freq: Once | ORAL | Status: AC
  Administered 2015-06-30: 300 mg via ORAL
  Filled 2015-06-30: qty 4

## 2015-06-30 MED ORDER — ANGIOPLASTY BOOK
Freq: Once | Status: DC
  Filled 2015-06-30: qty 1

## 2015-06-30 MED ORDER — SODIUM CHLORIDE 0.9 % WEIGHT BASED INFUSION: 3.0000 mL/kg/h | INTRAVENOUS | Status: DC

## 2015-06-30 MED ORDER — MIDAZOLAM HCL 2 MG/2ML IJ SOLN
INTRAMUSCULAR | Status: DC | PRN
  Administered 2015-06-30: 1 mg via INTRAVENOUS

## 2015-06-30 MED ORDER — SODIUM CHLORIDE 0.9 % WEIGHT BASED INFUSION
1.0000 mL/kg/h | INTRAVENOUS | Status: DC
  Administered 2015-06-30: 10:00:00 1 mL/kg/h via INTRAVENOUS

## 2015-06-30 MED ORDER — HYDRALAZINE HCL 20 MG/ML IJ SOLN
10.0000 mg | INTRAMUSCULAR | Status: DC | PRN
  Administered 2015-07-01 – 2015-07-02 (×6): 10 mg via INTRAVENOUS
  Filled 2015-06-30 (×6): qty 1

## 2015-06-30 MED ORDER — MIDAZOLAM HCL 2 MG/2ML IJ SOLN
INTRAMUSCULAR | Status: AC
  Filled 2015-06-30: qty 2

## 2015-06-30 MED ORDER — HEPARIN SODIUM (PORCINE) 1000 UNIT/ML IJ SOLN
INTRAMUSCULAR | Status: AC
  Filled 2015-06-30: qty 1

## 2015-06-30 MED ORDER — HEPARIN (PORCINE) IN NACL 2-0.9 UNIT/ML-% IJ SOLN
INTRAMUSCULAR | Status: AC
  Filled 2015-06-30: qty 1000

## 2015-06-30 MED ORDER — SODIUM CHLORIDE 0.9 % IV SOLN: 250.0000 mL | INTRAVENOUS | Status: DC | PRN

## 2015-06-30 MED ORDER — ASPIRIN 81 MG PO CHEW
243.0000 mg | CHEWABLE_TABLET | ORAL | Status: AC
  Administered 2015-06-30: 243 mg via ORAL
  Filled 2015-06-30: qty 3

## 2015-06-30 MED ORDER — SODIUM CHLORIDE 0.9% FLUSH: 3.0000 mL | Freq: Two times a day (BID) | INTRAVENOUS | Status: DC

## 2015-06-30 MED ORDER — LABETALOL HCL 5 MG/ML IV SOLN
INTRAVENOUS | Status: AC
  Filled 2015-06-30: qty 4

## 2015-06-30 MED ORDER — CLOPIDOGREL BISULFATE 75 MG PO TABS
75.0000 mg | ORAL_TABLET | Freq: Every day | ORAL | Status: DC
  Administered 2015-07-01 – 2015-07-04 (×5): 75 mg via ORAL
  Filled 2015-06-30 (×6): qty 1

## 2015-06-30 MED ORDER — HYDRALAZINE HCL 25 MG PO TABS
25.0000 mg | ORAL_TABLET | Freq: Three times a day (TID) | ORAL | Status: DC
  Administered 2015-06-30 – 2015-07-01 (×2): 25 mg via ORAL
  Filled 2015-06-30 (×2): qty 1

## 2015-06-30 MED ORDER — VERAPAMIL HCL 2.5 MG/ML IV SOLN
INTRAVENOUS | Status: DC | PRN
  Administered 2015-06-30: 15:00:00 via INTRA_ARTERIAL

## 2015-06-30 MED ORDER — IOPAMIDOL (ISOVUE-370) INJECTION 76%
INTRAVENOUS | Status: AC
  Filled 2015-06-30: qty 100

## 2015-06-30 MED ORDER — HEART ATTACK BOUNCING BOOK
Freq: Once | Status: AC
  Administered 2015-06-30: 22:00:00
  Filled 2015-06-30: qty 1

## 2015-06-30 MED ORDER — SODIUM CHLORIDE 0.9 % WEIGHT BASED INFUSION
3.0000 mL/kg/h | INTRAVENOUS | Status: AC
  Administered 2015-06-30 (×2): 3 mL/kg/h via INTRAVENOUS

## 2015-06-30 MED ORDER — NITROGLYCERIN 1 MG/10 ML FOR IR/CATH LAB
INTRA_ARTERIAL | Status: AC
  Filled 2015-06-30: qty 10

## 2015-06-30 MED ORDER — IOPAMIDOL (ISOVUE-370) INJECTION 76%
INTRAVENOUS | Status: DC | PRN
  Administered 2015-06-30: 220 mL via INTRA_ARTERIAL

## 2015-06-30 MED ORDER — LABETALOL HCL 5 MG/ML IV SOLN
INTRAVENOUS | Status: DC | PRN
  Administered 2015-06-30: 10 mg via INTRAVENOUS

## 2015-06-30 MED ORDER — HYDRALAZINE HCL 20 MG/ML IJ SOLN
10.0000 mg | Freq: Once | INTRAMUSCULAR | Status: AC
Start: 2015-06-30 — End: 2015-06-30
  Administered 2015-06-30: 10 mg via INTRAVENOUS
  Filled 2015-06-30: qty 1

## 2015-06-30 MED ORDER — HEPARIN (PORCINE) IN NACL 2-0.9 UNIT/ML-% IJ SOLN
INTRAMUSCULAR | Status: DC | PRN
  Administered 2015-06-30: 1000 mL

## 2015-06-30 MED ORDER — VERAPAMIL HCL 2.5 MG/ML IV SOLN
INTRAVENOUS | Status: AC
  Filled 2015-06-30: qty 2

## 2015-06-30 MED ORDER — FENTANYL CITRATE (PF) 100 MCG/2ML IJ SOLN
INTRAMUSCULAR | Status: DC | PRN
  Administered 2015-06-30: 50 ug via INTRAVENOUS

## 2015-06-30 MED ORDER — HEPARIN (PORCINE) IN NACL 100-0.45 UNIT/ML-% IJ SOLN
800.0000 [IU]/h | INTRAMUSCULAR | Status: DC
  Administered 2015-07-01 – 2015-07-02 (×2): 900 [IU]/h via INTRAVENOUS
  Filled 2015-06-30 (×2): qty 250

## 2015-06-30 SURGICAL SUPPLY — 10 items
CATH INFINITI 5 FR IM (CATHETERS) ×3 IMPLANT
CATH INFINITI 5FR MULTPACK ANG (CATHETERS) ×3 IMPLANT
DEVICE RAD COMP TR BAND LRG (VASCULAR PRODUCTS) ×3 IMPLANT
GLIDESHEATH SLEND A-KIT 6F 22G (SHEATH) ×3 IMPLANT
KIT HEART LEFT (KITS) ×3 IMPLANT
PACK CARDIAC CATHETERIZATION (CUSTOM PROCEDURE TRAY) ×3 IMPLANT
TRANSDUCER W/STOPCOCK (MISCELLANEOUS) ×3 IMPLANT
TUBING CIL FLEX 10 FLL-RA (TUBING) ×3 IMPLANT
WIRE HI TORQ VERSACORE-J 145CM (WIRE) ×3 IMPLANT
WIRE SAFE-T 1.5MM-J .035X260CM (WIRE) ×3 IMPLANT

## 2015-06-30 NOTE — Progress Notes (Signed)
Patient Name: Christina Boyle Date of Encounter: 06/30/2015  Principal Problem:   NSTEMI (non-ST elevated myocardial infarction) [facility]) Active Problems:   Hx of CABG 2007   Hypertension   COPD (chronic obstructive pulmonary disease) (HCC)   Acute renal insufficiency   Hypertensive cardiovascular disease   Aortic stenosis-mild to moderate   Hyperlipemia   TOBACCO ABUSE   COARCTATION OF AORTA   Morbid obesity- BMI 35   Primary Cardiologist: Dr. Harl Bowie Patient Profile:   SUBJECTIVE: [name] is an 60 y.o. female status post CABG in 2007 with an intermediate Lexiscan in Nov 2015, HLD, HTN, obesity, COPD, and current smoker. Patient was seen 06/28/15 at [facility] with multiple complaints including chest tightness with left jaw pain lasted several hours. She ruled in for a NSTEMI- Troponin pk 1.65. She was transferred for cath but SCr was 1.58 on admission and cath was put off 24 hrs for hydration.   OBJECTIVE Filed Vitals:   06/29/15 1923 06/29/15 2028 06/30/15 0120 06/30/15 0403  BP: 185/38  163/39 173/43  Pulse: 67  59 59  Temp: 97.4 F (36.3 C)   98 F (36.7 C)  TempSrc: Oral   Oral  Resp: 24  15 18   Height:      Weight:    199 lb 8.3 oz (90.5 kg)  SpO2: 93% 92% 95% 95%    Intake/Output Summary (Last 24 hours) at 06/30/15 0819 Last data filed at 06/30/15 0205  Gross per 24 hour  Intake 1972.76 ml  Output   1400 ml  Net 572.76 ml   Filed Weights   06/28/15 2138 06/29/15 0600 06/30/15 0403  Weight: 194 lb 14.2 oz (88.4 kg) 194 lb 14.2 oz (88.4 kg) 199 lb 8.3 oz (90.5 kg)    PHYSICAL EXAM General: Obese Caucasian female in no acute distress. Head: Normocephalic, atraumatic.  Neck: Supple without bruits, JVD. Lungs:  Resp regular and unlabored, CTA but overall decreased breath sounds Heart: RRR, 2/6 systolic murmur LSB, no rub. Abdomen: Obese, Soft, non-tender, non-distended Extremities: No clubbing, cyanosis, edema.  Neuro: Alert and oriented X 3.  Moves all extremities spontaneously. Psych: Normal affect.  LABS: CBC:  Recent Labs  06/27/15 1057 06/28/15 0447 06/30/15 0513  WBC 7.8 14.5* 17.0*  NEUTROABS 5.0  --   --   HGB 15.9* 12.6 11.3*  HCT 48.5* 39.0 35.1*  MCV 93.1 93.5 93.4  PLT 169 186 168   INR:  Recent Labs  06/28/15 1159  INR [id number]   Basic Metabolic Panel:  Recent Labs  06/29/15 0748 06/30/15 0513  NA 136 137  K 4.4 3.7  CL 103 106  CO2 21* 22  GLUCOSE 165* 164*  BUN 36* 35*  CREATININE 1.58* 1.20*  CALCIUM 9.7 9.1   Liver Function Tests:  Recent Labs  06/28/15 0447  AST 31  ALT 21  ALKPHOS 70  BILITOT 0.5  PROT 7.2  ALBUMIN 4.1   Cardiac Enzymes:  Recent Labs  06/27/15 1657 06/27/15 2134 06/28/15 0447  TROPONINI 0.10* 0.47* 1.65*    Current facility-administered medications:  .  0.9 %  sodium chloride infusion, , Intravenous, Continuous, Oswald Hillock, MD, Stopped at 06/28/15 2145 .  0.9 %  sodium chloride infusion, 250 mL, Intravenous, PRN, Imogene Burn, PA-C .  0.9 %  sodium chloride infusion, , Intravenous, Continuous, Belva Crome, MD, Last Rate: 75 mL/hr at 06/30/15 0128 .  0.9 %  sodium chloride infusion, 250 mL, Intravenous, PRN,  Arbutus Leas, NP .  0.9 %  sodium chloride infusion, , Intravenous, Continuous, Arbutus Leas, NP, Last Rate: 75 mL/hr at 06/30/15 0400 .  acetaminophen (TYLENOL) tablet 1,000 mg, 1,000 mg, Oral, Q6H PRN, Oswald Hillock, MD, 1,000 mg at 06/27/15 2239 .  antiseptic oral rinse (CPC / CETYLPYRIDINIUM CHLORIDE 0.05%) solution 7 mL, 7 mL, Mouth Rinse, BID, Sinda Du, MD, 7 mL at 06/29/15 0946 .  atorvastatin (LIPITOR) tablet 80 mg, 80 mg, Oral, Daily, Oswald Hillock, MD, 80 mg at 06/29/15 0946 .  [COMPLETED] heparin bolus via infusion 4,000 Units, 4,000 Units, Intravenous, Once, 4,000 Units at 06/28/15 1215 **FOLLOWED BY** heparin ADULT infusion 100 units/mL (25000 units/250 mL), 850 Units/hr, Intravenous, Continuous, Sinda Du, MD, Last Rate:  8.5 mL/hr at 06/30/15 0100, 850 Units/hr at 06/30/15 0100 .  hydrALAZINE (APRESOLINE) injection 10 mg, 10 mg, Intravenous, Q6H PRN, Arbutus Leas, NP .  ipratropium (ATROVENT) nebulizer solution 0.5 mg, 0.5 mg, Nebulization, TID, Sherren Mocha, MD, 0.5 mg at 06/29/15 2028 .  isosorbide mononitrate (IMDUR) 24 hr tablet 60 mg, 60 mg, Oral, Daily, Imogene Burn, PA-C, 60 mg at 06/29/15 0910 .  levalbuterol (XOPENEX) nebulizer solution 0.63 mg, 0.63 mg, Nebulization, TID, Sherren Mocha, MD, 0.63 mg at 06/29/15 2028 .  Melatonin TABS 3 mg, 3 mg, Oral, QHS PRN, Sinda Du, MD, 3 mg at 06/29/15 2200 .  methylPREDNISolone sodium succinate (SOLU-MEDROL) 125 mg/2 mL injection 60 mg, 60 mg, Intravenous, Q6H, Oswald Hillock, MD, 60 mg at 06/30/15 0729 .  metoprolol tartrate (LOPRESSOR) tablet 75 mg, 75 mg, Oral, BID, Oswald Hillock, MD, 75 mg at 06/29/15 2159 .  nicotine (NICODERM CQ - dosed in mg/24 hours) patch 21 mg, 21 mg, Transdermal, Daily, Oswald Hillock, MD, 21 mg at 06/29/15 0914 .  ondansetron (ZOFRAN) tablet 4 mg, 4 mg, Oral, Q6H PRN **OR** ondansetron (ZOFRAN) injection 4 mg, 4 mg, Intravenous, Q6H PRN, Oswald Hillock, MD .  phenol (CHLORASEPTIC) mouth spray 1 spray, 1 spray, Mouth/Throat, PRN, Sinda Du, MD .  sodium chloride flush (NS) 0.9 % injection 3 mL, 3 mL, Intravenous, Q12H, Jennye Moccasin Lenze, PA-C, 3 mL at 06/29/15 1000 .  sodium chloride flush (NS) 0.9 % injection 3 mL, 3 mL, Intravenous, PRN, Imogene Burn, PA-C .  sodium chloride flush (NS) 0.9 % injection 3 mL, 3 mL, Intravenous, Q12H, Arbutus Leas, NP, 3 mL at 06/29/15 2200 .  sodium chloride flush (NS) 0.9 % injection 3 mL, 3 mL, Intravenous, PRN, Arbutus Leas, NP . sodium chloride Stopped (06/28/15 2145)  . sodium chloride 75 mL/hr at 06/30/15 0128  . sodium chloride 75 mL/hr at 06/30/15 0400  . heparin 850 Units/hr (06/30/15 0100)    TELE:  NSR      ECG: NSR, T wave inversion V1-V6.   Radiology/Studies: No results  found.   Current Medications:  . antiseptic oral rinse  7 mL Mouth Rinse BID  . atorvastatin  80 mg Oral Daily  . ipratropium  0.5 mg Nebulization TID  . isosorbide mononitrate  60 mg Oral Daily  . levalbuterol  0.63 mg Nebulization TID  . methylPREDNISolone (SOLU-MEDROL) injection  60 mg Intravenous Q6H  . metoprolol  75 mg Oral BID  . nicotine  21 mg Transdermal Daily  . sodium chloride flush  3 mL Intravenous Q12H  . sodium chloride flush  3 mL Intravenous Q12H   . sodium chloride Stopped (06/28/15 2145)  . sodium chloride  75 mL/hr at 06/30/15 0128  . sodium chloride 75 mL/hr at 06/30/15 0400  . heparin 850 Units/hr (06/30/15 0100)    ASSESSMENT AND PLAN: Principal Problem:   NSTEMI (non-ST elevated myocardial infarction) [facility]) Active Problems:   Hx of CABG 2007   Hypertension   COPD (chronic obstructive pulmonary disease) (HCC)   Acute renal insufficiency   Hypertensive cardiovascular disease   Aortic stenosis-mild to moderate   Hyperlipemia   TOBACCO ABUSE   COARCTATION OF AORTA   Morbid obesity- BMI 35  1. NSTEMI: Troponin peaked at 1.65, EKG showed LVH and t wave inversion in leads V1-V6. Plan is for cath today, however creatinine was elevated, cath will be delayed for hydration. ACE stopped  2. HLD: Last LDL was 122.  She is on high intensity statin.   3. COPD: lung sounds diminished, no wheezing today, continue nebulizer treatments.  She is a current smoker.    Plan: SCr improved today. The patient was counseled to undergo left heart catheterization, coronary angiography, and possible percutaneous coronary intervention with stent implantation. The procedural risks and benefits were discussed in detail. The risks discussed included death, stroke, myocardial infarction, life-threatening bleeding, limb ischemia, kidney injury, allergy, and possible emergency cardiac surgery. The risk of these significant complications were estimated to occur less than 1% of the time.  After discussion, the patient has agreed to proceed.  Henri Medal , PA 8:19 AM 06/30/2015 The patient has been seen in conjunction with Kerin Ransom, PAC. All aspects of care have been considered and discussed. The patient has been personally interviewed, examined, and all clinical data has been reviewed.   For cath.

## 2015-06-30 NOTE — Progress Notes (Signed)
BP 211/91, SR 85.  PO BP meds given now. Pt irritable and c/o butt sore from lying.  Pt feels anxious because MD was unable to fix blockage today.  Nell Range PA notifed, orders received.  Lungs diminished w/ exp wheezes bil but no crackles.  95% on 2L.

## 2015-06-30 NOTE — Progress Notes (Signed)
ANTICOAGULATION CONSULT NOTE - Follow Up Consult  Pharmacy Consult:  Heparin Indication:  ACS  No Known Allergies  Patient Measurements: Height: 5\' 2"  (157.5 cm) Weight: 199 lb 8.3 oz (90.5 kg) IBW/kg (Calculated) : 50.1 Heparin Dosing Weight: 70 kg  Vital Signs: Temp: 98.3 F (36.8 C) (04/21 0832) Temp Source: Oral (04/21 0832) BP: 190/51 mmHg (04/21 0832) Pulse Rate: 61 (04/21 0832)  Labs:  Recent Labs  06/27/15 1057 06/27/15 1657 06/27/15 2134 06/28/15 0447 06/28/15 1159 06/28/15 1722 06/29/15 0748 06/29/15 1528 06/30/15 0513  HGB 15.9*  --   --  12.6  --   --   --   --  11.3*  HCT 48.5*  --   --  39.0  --   --   --   --  35.1*  PLT 169  --   --  186  --   --   --   --  168  LABPROT  --   --   --   --  14.1  --   --   --   --   INR  --   --   --   --  1.07  --   --   --   --   HEPARINUNFRC  --   --   --   --   --  0.58  --  0.38 0.58  CREATININE 0.92  --   --  1.34*  --   --  1.58*  --  1.20*  TROPONINI <0.03 0.10* 0.47* 1.65*  --   --   --   --   --     Estimated Creatinine Clearance: 50.9 mL/min (by C-G formula based on Cr of 1.2).      Assessment: 35 YOF with ACS to continue on IV heparin.  Cath delayed until today due to AKI, now resolving.  Heparin level therapeutic; no bleeding reported.   Goal of Therapy:  Heparin level 0.3-0.7 units/ml Monitor platelets by anticoagulation protocol: Yes    Plan:  - Continue heparin gtt at 850 units/hr - Daily HL / CBC - F/U maintenance ASA order post cath, WBC trend, BP   Nishat Livingston D. Mina Marble, PharmD, BCPS Pager:  [phone] 06/30/2015, 8:58 AM

## 2015-06-30 NOTE — Progress Notes (Signed)
Pt more relaxed, lying in bed without complaints, speech clear.  SR 78.  BP 209/55.  01% on 2L.  IVF completed.  Will continue to monitor.

## 2015-06-30 NOTE — Progress Notes (Signed)
TR BAND REMOVAL  LOCATION:    left radial  DEFLATED PER PROTOCOL:    Yes.    TIME BAND OFF / DRESSING APPLIED:   2100   SITE UPON ARRIVAL:    Level 0  SITE AFTER BAND REMOVAL:    Level 0  CIRCULATION SENSATION AND MOVEMENT:    Within Normal Limits   Yes.    COMMENTS:   Post radial cath instructions reviewed w/ pt and daughter.  Pt dozing quietly, states feels much better since Xanax.  BP down to 186/48.  Awakens easily to voice but drifts back to sleep quickly.  Bed exit alarm on.  Reminded pt not to get up without assist.

## 2015-06-30 NOTE — Interval H&P Note (Signed)
History and Physical Interval Note:   Reason for Cardiac Catheterization: Chest pain - NSTEMI Referring Physician: Dr. Sinda Du Cardiologist: Dr. Carlyle Dolly  06/30/2015 2:16 PM  Rito Ehrlich  has presented today for surgery, with the diagnosis of non-ST elevation MI.  The various methods of treatment have been discussed with the patient and family. After consideration of risks, benefits and other options for treatment, the patient has consented to  Procedure(s): Left Heart Cath and Cors/Grafts Angiography (N/A) as a surgical intervention .  The patient's history has been reviewed, patient examined, no change in status, stable for surgery.  I have reviewed the patient's chart and labs.  Questions were answered to the patient's satisfaction.    Cath Lab Visit (complete for each Cath Lab visit)  Clinical Evaluation Leading to the Procedure:   ACS: Yes.    Non-ACS:    Anginal Classification: CCS III  Anti-ischemic medical therapy: Maximal Therapy (2 or more classes of medications)  Non-Invasive Test Results: No non-invasive testing performed  Prior CABG: Previous CABG  TIMI Score  Patient Information:  TIMI Score is 5  Revascularization of the presumed culprit artery  A (9)  Indication: 11; Score: 9 TIMI Score  Patient Information:  TIMI Score is 5  Revascularization of multiple coronary arteries when the culprit artery cannot clearly be determined  A (9)  Indication: 12; Score: 9   HARDING, DAVID W

## 2015-06-30 NOTE — Progress Notes (Signed)
ANTICOAGULATION CONSULT NOTE - Follow Up Consult  Pharmacy Consult:  Heparin Indication:  ACS  No Known Allergies  Patient Measurements: Height: 5\' 2"  (157.5 cm) Weight: 199 lb 8.3 oz (90.5 kg) IBW/kg (Calculated) : 50.1 Heparin Dosing Weight: 70 kg  Vital Signs: Temp: 97.6 F (36.4 C) (04/21 1920) Temp Source: Oral (04/21 1920) BP: 209/55 mmHg (04/21 2032) Pulse Rate: 78 (04/21 2032)  Labs:  Recent Labs  06/27/15 2134 06/28/15 0447 06/28/15 1159 06/28/15 1722 06/29/15 0748 06/29/15 1528 06/30/15 0513  HGB  --  12.6  --   --   --   --  11.3*  HCT  --  39.0  --   --   --   --  35.1*  PLT  --  186  --   --   --   --  168  LABPROT  --   --  14.1  --   --   --   --   INR  --   --  1.07  --   --   --   --   HEPARINUNFRC  --   --   --  0.58  --  0.38 0.58  CREATININE  --  1.34*  --   --  1.58*  --  1.20*  TROPONINI 0.47* 1.65*  --   --   --   --   --     Estimated Creatinine Clearance: 50.9 mL/min (by C-G formula based on Cr of 1.2).    Assessment: 1 YOF with ACS transferred from [facility] for further management.   S/p cath today with plans for PCI on Monday, heparin to resume 8 hours post TR band removal at 9  pm   Goal of Therapy:  Heparin level 0.3-0.7 units/ml Monitor platelets by anticoagulation protocol: Yes    Plan:  - Heparin gtt at 900 units/hr starting at 5 am 4/22 - 8 hour heparin level - Daily HL / CBC  Thank you Anette Guarneri, PharmD [phone]  06/30/2015, 9:01 PM

## 2015-06-30 NOTE — Progress Notes (Addendum)
       Patient Name: Christina Boyle Date of Encounter: 06/30/2015    SUBJECTIVE: No problems over night.   TELEMETRY:  NSR: Filed Vitals:   06/29/15 1923 06/29/15 2028 06/30/15 0120 06/30/15 0403  BP: 185/38  163/39 173/43  Pulse: 67  59 59  Temp: 97.4 F (36.3 C)   98 F (36.7 C)  TempSrc: Oral   Oral  Resp: 24  15 18   Height:      Weight:    199 lb 8.3 oz (90.5 kg)  SpO2: 93% 92% 95% 95%    Intake/Output Summary (Last 24 hours) at 06/30/15 0759 Last data filed at 06/30/15 0205  Gross per 24 hour  Intake 1972.76 ml  Output   1400 ml  Net 572.76 ml   LABS: Basic Metabolic Panel:  Recent Labs  06/29/15 0748 06/30/15 0513  NA 136 137  K 4.4 3.7  CL 103 106  CO2 21* 22  GLUCOSE 165* 164*  BUN 36* 35*  CREATININE 1.58* 1.20*  CALCIUM 9.7 9.1   CBC:  Recent Labs  06/27/15 1057 06/28/15 0447 06/30/15 0513  WBC 7.8 14.5* 17.0*  NEUTROABS 5.0  --   --   HGB 15.9* 12.6 11.3*  HCT 48.5* 39.0 35.1*  MCV 93.1 93.5 93.4  PLT 169 186 168   Cardiac Enzymes:  Recent Labs  06/27/15 1657 06/27/15 2134 06/28/15 0447  TROPONINI 0.10* 0.47* 1.65*     Radiology/Studies:  CXR with NAD  Physical Exam: Blood pressure 173/43, pulse 59, temperature 98 F (36.7 C), temperature source Oral, resp. rate 18, height 5\' 2"  (1.575 m), weight 199 lb 8.3 oz (90.5 kg), SpO2 95 %. Weight change: 4 lb 10.1 oz (2.1 kg)  Wt Readings from Last 3 Encounters:  06/30/15 199 lb 8.3 oz (90.5 kg)  06/12/15 195 lb 12.8 oz (88.814 kg)  03/21/15 194 lb (87.998 kg)   Chest clear  ASSESSMENT:  1. NSTEMI 2. Aortic coarctation 3. COPD 4. CKD with improved creatinine following hydration.  Plan:  1. Cath today. 2. Continue hydration  Signed, Sinclair Grooms 06/30/2015, 7:59 AM

## 2015-06-30 NOTE — Care Management Note (Addendum)
Case Management Note  Patient Details  Name: Christina Boyle MRN: [id number] Date of Birth: 10-Oct-1955  Subjective/Objective:    Patient is from home, for cath today, will be on plavix, NCM will cont to follow for dc needs.                 Action/Plan:   Expected Discharge Date:                  Expected Discharge Plan:  Home/Self Care (Patient will transfer to [facility].)  In-House Referral:     Discharge planning Services  CM Consult  Post Acute Care Choice:    Choice offered to:     DME Arranged:    DME Agency:     HH Arranged:    [place] Agency:     Status of Service:  Completed, signed off  Medicare Important Message Given:    Date Medicare IM Given:    Medicare IM give by:    Date Additional Medicare IM Given:    Additional Medicare Important Message give by:     If discussed at [place] of Stay Meetings, dates discussed:    Additional Comments:  Zenon Mayo, RN 06/30/2015, 10:34 AM

## 2015-06-30 NOTE — H&P (View-Only) (Signed)
Patient Name: Christina Boyle Date of Encounter: 06/30/2015  Principal Problem:   NSTEMI (non-ST elevated myocardial infarction) [facility]) Active Problems:   Hx of CABG 2007   Hypertension   COPD (chronic obstructive pulmonary disease) (HCC)   Acute renal insufficiency   Hypertensive cardiovascular disease   Aortic stenosis-mild to moderate   Hyperlipemia   TOBACCO ABUSE   COARCTATION OF AORTA   Morbid obesity- BMI 35   Primary Cardiologist: Dr. Harl Bowie Patient Profile:   SUBJECTIVE: Christina Boyle is an 60 y.o. female status post CABG in 2007 with an intermediate Lexiscan in Nov 2015, HLD, HTN, obesity, COPD, and current smoker. Patient was seen 06/28/15 at [facility] with multiple complaints including chest tightness with left jaw pain lasted several hours. She ruled in for a NSTEMI- Troponin pk 1.65. She was transferred for cath but SCr was 1.58 on admission and cath was put off 24 hrs for hydration.   OBJECTIVE Filed Vitals:   06/29/15 1923 06/29/15 2028 06/30/15 0120 06/30/15 0403  BP: 185/38  163/39 173/43  Pulse: 67  59 59  Temp: 97.4 F (36.3 C)   98 F (36.7 C)  TempSrc: Oral   Oral  Resp: 24  15 18   Height:      Weight:    199 lb 8.3 oz (90.5 kg)  SpO2: 93% 92% 95% 95%    Intake/Output Summary (Last 24 hours) at 06/30/15 0819 Last data filed at 06/30/15 0205  Gross per 24 hour  Intake 1972.76 ml  Output   1400 ml  Net 572.76 ml   Filed Weights   06/28/15 2138 06/29/15 0600 06/30/15 0403  Weight: 194 lb 14.2 oz (88.4 kg) 194 lb 14.2 oz (88.4 kg) 199 lb 8.3 oz (90.5 kg)    PHYSICAL EXAM General: Obese Caucasian female in no acute distress. Head: Normocephalic, atraumatic.  Neck: Supple without bruits, JVD. Lungs:  Resp regular and unlabored, CTA but overall decreased breath sounds Heart: RRR, 2/6 systolic murmur LSB, no rub. Abdomen: Obese, Soft, non-tender, non-distended Extremities: No clubbing, cyanosis, edema.  Neuro: Alert and oriented X 3.  Moves all extremities spontaneously. Psych: Normal affect.  LABS: CBC:  Recent Labs  06/27/15 1057 06/28/15 0447 06/30/15 0513  WBC 7.8 14.5* 17.0*  NEUTROABS 5.0  --   --   HGB 15.9* 12.6 11.3*  HCT 48.5* 39.0 35.1*  MCV 93.1 93.5 93.4  PLT 169 186 168   INR:  Recent Labs  06/28/15 1159  INR [id number]   Basic Metabolic Panel:  Recent Labs  06/29/15 0748 06/30/15 0513  NA 136 137  K 4.4 3.7  CL 103 106  CO2 21* 22  GLUCOSE 165* 164*  BUN 36* 35*  CREATININE 1.58* 1.20*  CALCIUM 9.7 9.1   Liver Function Tests:  Recent Labs  06/28/15 0447  AST 31  ALT 21  ALKPHOS 70  BILITOT 0.5  PROT 7.2  ALBUMIN 4.1   Cardiac Enzymes:  Recent Labs  06/27/15 1657 06/27/15 2134 06/28/15 0447  TROPONINI 0.10* 0.47* 1.65*    Current facility-administered medications:  .  0.9 %  sodium chloride infusion, , Intravenous, Continuous, Oswald Hillock, MD, Stopped at 06/28/15 2145 .  0.9 %  sodium chloride infusion, 250 mL, Intravenous, PRN, Imogene Burn, PA-C .  0.9 %  sodium chloride infusion, , Intravenous, Continuous, Belva Crome, MD, Last Rate: 75 mL/hr at 06/30/15 0128 .  0.9 %  sodium chloride infusion, 250 mL, Intravenous, PRN,  Arbutus Leas, NP .  0.9 %  sodium chloride infusion, , Intravenous, Continuous, Arbutus Leas, NP, Last Rate: 75 mL/hr at 06/30/15 0400 .  acetaminophen (TYLENOL) tablet 1,000 mg, 1,000 mg, Oral, Q6H PRN, Oswald Hillock, MD, 1,000 mg at 06/27/15 2239 .  antiseptic oral rinse (CPC / CETYLPYRIDINIUM CHLORIDE 0.05%) solution 7 mL, 7 mL, Mouth Rinse, BID, Sinda Du, MD, 7 mL at 06/29/15 0946 .  atorvastatin (LIPITOR) tablet 80 mg, 80 mg, Oral, Daily, Oswald Hillock, MD, 80 mg at 06/29/15 0946 .  [COMPLETED] heparin bolus via infusion 4,000 Units, 4,000 Units, Intravenous, Once, 4,000 Units at 06/28/15 1215 **FOLLOWED BY** heparin ADULT infusion 100 units/mL (25000 units/250 mL), 850 Units/hr, Intravenous, Continuous, Sinda Du, MD, Last Rate:  8.5 mL/hr at 06/30/15 0100, 850 Units/hr at 06/30/15 0100 .  hydrALAZINE (APRESOLINE) injection 10 mg, 10 mg, Intravenous, Q6H PRN, Arbutus Leas, NP .  ipratropium (ATROVENT) nebulizer solution 0.5 mg, 0.5 mg, Nebulization, TID, Sherren Mocha, MD, 0.5 mg at 06/29/15 2028 .  isosorbide mononitrate (IMDUR) 24 hr tablet 60 mg, 60 mg, Oral, Daily, Imogene Burn, PA-C, 60 mg at 06/29/15 0910 .  levalbuterol (XOPENEX) nebulizer solution 0.63 mg, 0.63 mg, Nebulization, TID, Sherren Mocha, MD, 0.63 mg at 06/29/15 2028 .  Melatonin TABS 3 mg, 3 mg, Oral, QHS PRN, Sinda Du, MD, 3 mg at 06/29/15 2200 .  methylPREDNISolone sodium succinate (SOLU-MEDROL) 125 mg/2 mL injection 60 mg, 60 mg, Intravenous, Q6H, Oswald Hillock, MD, 60 mg at 06/30/15 0729 .  metoprolol tartrate (LOPRESSOR) tablet 75 mg, 75 mg, Oral, BID, Oswald Hillock, MD, 75 mg at 06/29/15 2159 .  nicotine (NICODERM CQ - dosed in mg/24 hours) patch 21 mg, 21 mg, Transdermal, Daily, Oswald Hillock, MD, 21 mg at 06/29/15 0914 .  ondansetron (ZOFRAN) tablet 4 mg, 4 mg, Oral, Q6H PRN **OR** ondansetron (ZOFRAN) injection 4 mg, 4 mg, Intravenous, Q6H PRN, Oswald Hillock, MD .  phenol (CHLORASEPTIC) mouth spray 1 spray, 1 spray, Mouth/Throat, PRN, Sinda Du, MD .  sodium chloride flush (NS) 0.9 % injection 3 mL, 3 mL, Intravenous, Q12H, Jennye Moccasin Lenze, PA-C, 3 mL at 06/29/15 1000 .  sodium chloride flush (NS) 0.9 % injection 3 mL, 3 mL, Intravenous, PRN, Imogene Burn, PA-C .  sodium chloride flush (NS) 0.9 % injection 3 mL, 3 mL, Intravenous, Q12H, Arbutus Leas, NP, 3 mL at 06/29/15 2200 .  sodium chloride flush (NS) 0.9 % injection 3 mL, 3 mL, Intravenous, PRN, Arbutus Leas, NP . sodium chloride Stopped (06/28/15 2145)  . sodium chloride 75 mL/hr at 06/30/15 0128  . sodium chloride 75 mL/hr at 06/30/15 0400  . heparin 850 Units/hr (06/30/15 0100)    TELE:  NSR      ECG: NSR, T wave inversion V1-V6.   Radiology/Studies: No results  found.   Current Medications:  . antiseptic oral rinse  7 mL Mouth Rinse BID  . atorvastatin  80 mg Oral Daily  . ipratropium  0.5 mg Nebulization TID  . isosorbide mononitrate  60 mg Oral Daily  . levalbuterol  0.63 mg Nebulization TID  . methylPREDNISolone (SOLU-MEDROL) injection  60 mg Intravenous Q6H  . metoprolol  75 mg Oral BID  . nicotine  21 mg Transdermal Daily  . sodium chloride flush  3 mL Intravenous Q12H  . sodium chloride flush  3 mL Intravenous Q12H   . sodium chloride Stopped (06/28/15 2145)  . sodium chloride  75 mL/hr at 06/30/15 0128  . sodium chloride 75 mL/hr at 06/30/15 0400  . heparin 850 Units/hr (06/30/15 0100)    ASSESSMENT AND PLAN: Principal Problem:   NSTEMI (non-ST elevated myocardial infarction) [facility]) Active Problems:   Hx of CABG 2007   Hypertension   COPD (chronic obstructive pulmonary disease) (HCC)   Acute renal insufficiency   Hypertensive cardiovascular disease   Aortic stenosis-mild to moderate   Hyperlipemia   TOBACCO ABUSE   COARCTATION OF AORTA   Morbid obesity- BMI 35  1. NSTEMI: Troponin peaked at 1.65, EKG showed LVH and t wave inversion in leads V1-V6. Plan is for cath today, however creatinine was elevated, cath will be delayed for hydration. ACE stopped  2. HLD: Last LDL was 122.  She is on high intensity statin.   3. COPD: lung sounds diminished, no wheezing today, continue nebulizer treatments.  She is a current smoker.    Plan: SCr improved today. The patient was counseled to undergo left heart catheterization, coronary angiography, and possible percutaneous coronary intervention with stent implantation. The procedural risks and benefits were discussed in detail. The risks discussed included death, stroke, myocardial infarction, life-threatening bleeding, limb ischemia, kidney injury, allergy, and possible emergency cardiac surgery. The risk of these significant complications were estimated to occur less than 1% of the time.  After discussion, the patient has agreed to proceed.  Henri Medal , PA 8:19 AM 06/30/2015 The patient has been seen in conjunction with Kerin Ransom, PAC. All aspects of care have been considered and discussed. The patient has been personally interviewed, examined, and all clinical data has been reviewed.   For cath.

## 2015-07-01 ENCOUNTER — Encounter (HOSPITAL_COMMUNITY): Payer: Self-pay | Admitting: *Deleted

## 2015-07-01 ENCOUNTER — Inpatient Hospital Stay (HOSPITAL_COMMUNITY): Payer: Medicaid Other

## 2015-07-01 DIAGNOSIS — E669 Obesity, unspecified: Secondary | ICD-10-CM

## 2015-07-01 DIAGNOSIS — Z951 Presence of aortocoronary bypass graft: Secondary | ICD-10-CM

## 2015-07-01 DIAGNOSIS — F17200 Nicotine dependence, unspecified, uncomplicated: Secondary | ICD-10-CM

## 2015-07-01 DIAGNOSIS — I119 Hypertensive heart disease without heart failure: Secondary | ICD-10-CM

## 2015-07-01 DIAGNOSIS — I350 Nonrheumatic aortic (valve) stenosis: Secondary | ICD-10-CM

## 2015-07-01 DIAGNOSIS — N289 Disorder of kidney and ureter, unspecified: Secondary | ICD-10-CM

## 2015-07-01 LAB — BLOOD GAS, ARTERIAL
Acid-base deficit: 4.9 mmol/L — ABNORMAL HIGH (ref 0.0–2.0)
BICARBONATE: 18.7 meq/L — AB (ref 20.0–24.0)
Drawn by: 235881
FIO2: 0.21
O2 Saturation: 89.7 %
PATIENT TEMPERATURE: 98.6
PH ART: 7.424 (ref 7.350–7.450)
TCO2: 19.6 mmol/L (ref 0–100)
pCO2 arterial: 29 mmHg — ABNORMAL LOW (ref 35.0–45.0)
pO2, Arterial: 59.1 mmHg — ABNORMAL LOW (ref 80.0–100.0)

## 2015-07-01 LAB — CBC
HEMATOCRIT: 35.6 % — AB (ref 36.0–46.0)
HEMOGLOBIN: 11.4 g/dL — AB (ref 12.0–15.0)
MCH: 29.9 pg (ref 26.0–34.0)
MCHC: 32 g/dL (ref 30.0–36.0)
MCV: 93.4 fL (ref 78.0–100.0)
Platelets: 154 10*3/uL (ref 150–400)
RBC: 3.81 MIL/uL — ABNORMAL LOW (ref 3.87–5.11)
RDW: 15.1 % (ref 11.5–15.5)
WBC: 14 10*3/uL — ABNORMAL HIGH (ref 4.0–10.5)

## 2015-07-01 LAB — BASIC METABOLIC PANEL
Anion gap: 11 (ref 5–15)
BUN: 31 mg/dL — AB (ref 6–20)
CHLORIDE: 107 mmol/L (ref 101–111)
CO2: 20 mmol/L — ABNORMAL LOW (ref 22–32)
Calcium: 9 mg/dL (ref 8.9–10.3)
Creatinine, Ser: 1.09 mg/dL — ABNORMAL HIGH (ref 0.44–1.00)
GFR calc Af Amer: >60 mL/min (ref >60)
GFR calc non Af Amer: 53 mL/min — ABNORMAL LOW (ref >60)
GLUCOSE: 154 mg/dL — AB (ref 65–99)
POTASSIUM: 3.7 mmol/L (ref 3.5–5.1)
SODIUM: 138 mmol/L (ref 135–145)

## 2015-07-01 LAB — HEPARIN LEVEL (UNFRACTIONATED): HEPARIN UNFRACTIONATED: 0.37 [IU]/mL (ref 0.30–0.70)

## 2015-07-01 LAB — PLATELET INHIBITION P2Y12: Platelet Function  P2Y12: 225 [PRU] (ref 194–418)

## 2015-07-01 LAB — BRAIN NATRIURETIC PEPTIDE: B Natriuretic Peptide: 418.8 pg/mL — ABNORMAL HIGH (ref 0.0–100.0)

## 2015-07-01 MED ORDER — HEART ATTACK BOUNCING BOOK
Freq: Once | Status: AC
  Administered 2015-07-01: 11:00:00
  Filled 2015-07-01: qty 1

## 2015-07-01 MED ORDER — HYDRALAZINE HCL 50 MG PO TABS
50.0000 mg | ORAL_TABLET | Freq: Three times a day (TID) | ORAL | Status: DC
  Administered 2015-07-01 – 2015-07-04 (×8): 50 mg via ORAL
  Filled 2015-07-01 (×9): qty 1

## 2015-07-01 MED ORDER — FUROSEMIDE 40 MG PO TABS
40.0000 mg | ORAL_TABLET | Freq: Every day | ORAL | Status: DC
  Administered 2015-07-01 – 2015-07-02 (×2): 40 mg via ORAL
  Filled 2015-07-01 (×2): qty 1

## 2015-07-01 MED ORDER — HYDRALAZINE HCL 25 MG PO TABS
25.0000 mg | ORAL_TABLET | Freq: Once | ORAL | Status: AC
  Administered 2015-07-01: 25 mg via ORAL
  Filled 2015-07-01: qty 1

## 2015-07-01 MED ORDER — ANGIOPLASTY BOOK
Freq: Once | Status: AC
  Administered 2015-07-01: 11:00:00
  Filled 2015-07-01: qty 1

## 2015-07-01 MED ORDER — FUROSEMIDE 10 MG/ML IJ SOLN
20.0000 mg | Freq: Once | INTRAMUSCULAR | Status: AC
  Administered 2015-07-01: 20 mg via INTRAVENOUS
  Filled 2015-07-01: qty 2

## 2015-07-01 MED ORDER — FUROSEMIDE 10 MG/ML IJ SOLN
INTRAMUSCULAR | Status: AC
  Filled 2015-07-01: qty 2

## 2015-07-01 NOTE — Progress Notes (Signed)
ANTICOAGULATION CONSULT NOTE - Follow Up Consult  Pharmacy Consult:  Heparin Indication:  ACS  No Known Allergies  Patient Measurements: Height: 5\' 2"  (157.5 cm) Weight: 198 lb 13.7 oz (90.2 kg) IBW/kg (Calculated) : 50.1 Heparin Dosing Weight: 70 kg  Vital Signs: Temp: 98 F (36.7 C) (04/22 1204) Temp Source: Oral (04/22 1204) BP: 165/48 mmHg (04/22 1204) Pulse Rate: 67 (04/22 1204)  Labs:  Recent Labs  06/29/15 0748 06/29/15 1528 06/30/15 0513 07/01/15 0408 07/01/15 1433  HGB  --   --  11.3* 11.4*  --   HCT  --   --  35.1* 35.6*  --   PLT  --   --  168 154  --   HEPARINUNFRC  --  0.38 0.58  --  0.37  CREATININE 1.58*  --  1.20* 1.09*  --     Estimated Creatinine Clearance: 55.8 mL/min (by C-G formula based on Cr of 1.09).   Assessment: 4 YOF with ACS transferred from [facility] for [facility] management.   S/p cath today with plans for PCI on Monday, heparin resumed 8 hours post TR band removal at 9pm.  HL in range this afternoon at 0.37 units/mL. Hgb 11.4, plts 154- no bleeding noted.  Goal of Therapy:  Heparin level 0.3-0.7 units/ml Monitor platelets by anticoagulation protocol: Yes  Plan:  - Continue Heparin gtt at 900 units/hr  - Daily HL / CBC - Follow s/s bleeding and changes to cards plans  Ivi Griffith D. Shaily Librizzi, PharmD, BCPS Clinical Pharmacist Pager: [phone] 07/01/2015 4:01 PM

## 2015-07-01 NOTE — Progress Notes (Signed)
Patient refused to have bed alarm on. Educated on Chief Strategy Officer. States her daughter will be in here with her. [address] rn

## 2015-07-01 NOTE — Progress Notes (Addendum)
Patient Name: Christina Boyle Date of Encounter: 07/01/2015  Principal Problem:   NSTEMI (non-ST elevated myocardial infarction) [facility]) Active Problems:   Hyperlipemia   TOBACCO ABUSE   COARCTATION OF AORTA   Hypertension   COPD exacerbation (HCC)   Acute renal insufficiency   Obesity (BMI 30-39.9)   Hx of CABG 2007   Hypertensive cardiovascular disease   Aortic stenosis-mild to moderate   Primary Cardiologist: Dr Harl Bowie  Patient Profile: 60 y.o. female CABG in 2007, LIMA to LAD, SVG-OM, SVG to PDA, echo EF 60-65% 2014, HTN, moderate aortic stenosis, echo 12/2014 EF 55-60% mean grad 15, AVA VTI 1.36, Coarctation of the aorta, carotid disease, tobacco abuse 1/2 ppd.  SUBJECTIVE: Does not feel well, feels SOB, even with O2 on. Has to sit up to breathe. Feels anxious as well.  OBJECTIVE Filed Vitals:   07/01/15 0500 07/01/15 0600 07/01/15 0650 07/01/15 0700  BP: 194/42 208/55 220/55 214/64  Pulse: 72 75 76 84  Temp:    98 F (36.7 C)  TempSrc:    Oral  Resp: 21 21 26 22   Height:      Weight:      SpO2: 95% 94% 92% 91%    Intake/Output Summary (Last 24 hours) at 07/01/15 0810 Last data filed at 07/01/15 0300  Gross per 24 hour  Intake   1466 ml  Output   2100 ml  Net   -634 ml   Filed Weights   06/29/15 0600 06/30/15 0403 07/01/15 0417  Weight: 194 lb 14.2 oz (88.4 kg) 199 lb 8.3 oz (90.5 kg) 198 lb 13.7 oz (90.2 kg)    PHYSICAL EXAM General: Well developed, well nourished, female in no acute distress. Head: Normocephalic, atraumatic.  Neck: Supple without bruits, JVD 10 cm. Lungs:  Resp regular and unlabored, decreased BS bases bilaterally. Heart: RRR, S1, S2, no S3, S4, 3/6 murmur; no rub. Abdomen: Soft, non-tender, non-distended, BS + x 4.  Extremities: No clubbing, cyanosis, no edema. Left radial cath site without ecchymosis or hematoma Neuro: Alert and oriented X 3. Moves all extremities spontaneously. Psych: Normal affect.  LABS: CBC:  Recent  Labs  06/30/15 0513 07/01/15 0408  WBC 17.0* 14.0*  HGB 11.3* 11.4*  HCT 35.1* 35.6*  MCV 93.4 93.4  PLT 168 154   INR:  Recent Labs  06/28/15 1159  INR [id number]   Basic Metabolic Panel:  Recent Labs  06/30/15 0513 07/01/15 0408  NA 137 138  K 3.7 3.7  CL 106 107  CO2 22 20*  GLUCOSE 164* 154*  BUN 35* 31*  CREATININE 1.20* 1.09*  CALCIUM 9.1 9.0   Lab Results  Component Value Date   CHOL 196 06/29/2014   HDL 45* 06/29/2014   LDLCALC 122* 06/29/2014   TRIG 143 06/29/2014   CHOLHDL 4.4 06/29/2014   Lab Results  Component Value Date   ALT 21 06/28/2015   AST 31 06/28/2015   ALKPHOS 70 06/28/2015   BILITOT 0.5 06/28/2015    TELE:   SR, ST    Cath: 06/30/2015 1. Ost RCA lesion, 95% stenosed. Mid RCA lesion, 50% stenosed. Dist RCA lesion, 70% stenosed. Post Atrio lesion, 80% stenosed. 2. 100% occluded SVG-RPDA at the origin 3. Ost LM to LM lesion, 60% stenosed. 4. Likely ostial left main disease but with brisk antegrade flow in the LAD system. Most notable lesion is the diagonal lesion. 5. 3rd Diag lesion, 80% stenosed. 6. Ost Cx to Prox Cx lesion, 80% stenosed.  Ost 1st Mrg to 1st Mrg lesion, 80% stenosed. 7. SVG-OM is widely patent with the exception of a 99% stenosis in the origin 8. LIMA was visualized by non-selective angiography due to inability to cannulate and . Significant disease in that portion of the left subclavian artery. The patient truly has 2 culprit lesions: Ostial SVG-OM and the occluded SVG-RCA with now ostial RCA disease. Both lesions are likely be difficult PCI. As the diagnostic procedure was somewhat difficult as far as engaging the LIMA graft, and significant amount of contrast was used with recent renal insufficiency, I felt it best to stop the procedure today with plans for having the patient return for 2 site PCI early next week. Plan:  Patient will return to nursing unit for continued medical care.  Would start heparin back 6-8 hours  following sheath removal.  I have loaded with Plavix 300 mg and ordered 75 mg daily.  She needs aggressive blood pressure management. I tentatively posted her for PCI on Monday, pending renal function reassessment.  Radiology/Studies: Dg Chest 2 View 06/27/2015  CLINICAL DATA:  60 year old with productive cough, congestion and shortness of breath for 5 days. History of COPD. EXAM: CHEST  2 VIEW COMPARISON:  03/21/2015. FINDINGS: The heart size and mediastinal contours are stable status post CABG. There is stable mild chronic lung disease with central airway thickening and mild hyperinflation. No evidence of edema, airspace disease, pleural effusion or pneumothorax. The bones appear unchanged. IMPRESSION: Stable postoperative chest.  No acute cardiopulmonary process. Electronically Signed   By: Richardean Sale M.D.   On: 06/27/2015 11:18   Current Medications:   . angioplasty book   Does not apply Once  . antiseptic oral rinse  7 mL Mouth Rinse BID  . atorvastatin  80 mg Oral Daily  . clopidogrel  75 mg Oral Q breakfast  . hydrALAZINE  50 mg Oral Q8H  . ipratropium  0.5 mg Nebulization TID  . isosorbide mononitrate  60 mg Oral Daily  . levalbuterol  0.63 mg Nebulization TID  . methylPREDNISolone (SOLU-MEDROL) injection  60 mg Intravenous Q6H  . metoprolol  75 mg Oral BID  . nicotine  21 mg Transdermal Daily  . sodium chloride flush  3 mL Intravenous Q12H   . heparin 900 Units/hr (07/01/15 0553)     ASSESSMENT AND PLAN: 1. NSTEMI: Troponin peaked at 1.65, EKG showed LVH and t wave inversion in leads V1-V6.  - s/p cath 04/21, needs PCI - PCI scheduled for Monday - loaded with Plavix, ck P2y12  2. HLD: LDL was 122. She is on high intensity statin.   3. SOB/COPD: lung sounds diminished, no wheezing today, continue nebulizer treatments. She is a current smoker - SOB is worse today. She has consistently needed O2 (sats mid-80s without it), ck ABG and BNP.  - weight up about 5 lbs,  Some volume overload by exam, will give IV Lasix 20 mg x 1  4.   Hypertension - BP higher today, getting prn hydralazine with little effect - Lasix will help, cannot increase BB 2nd HR 50s at times - lisinopril/HCTZ is on hold - increase hydralazine to 50 mg tid  Otherwise, continue current therapy Principal Problem:   NSTEMI (non-ST elevated myocardial infarction) ([place]) Active Problems:   Hyperlipemia   TOBACCO ABUSE   COARCTATION OF AORTA   COPD (chronic obstructive pulmonary disease) (HCC)   Acute renal insufficiency   Morbid obesity- BMI 35   Hx of CABG 2007   Hypertensive cardiovascular  disease   Aortic stenosis-mild to moderate   Signed, Barrett, Rhonda , PA-C 8:10 AM 07/01/2015  The patient was seen, examined and discussed with Rosaria Ferries, PA-C and I agree with the above.   60 y.o. female CABG in 2007, LIMA to LAD, SVG-OM, SVG to PDA, echo EF 60-65% 2014, HTN, moderate aortic stenosis, echo 12/2014 EF 55-60% mean grad 15, AVA VTI 1.36, Coarctation of the aorta, carotid disease, tobacco abuse 1/2 ppd, who presented with non-STEMI with maximum 1.65 and underwent left cardiac cath yesterday that showed 2 culprit lesions , ostial SVG-OM and the occluded SVG-RCA with now ostial RCA disease, both lesions are likely be difficult PCI. The patient is scheduled for dose interventions on Monday. She is currently asymptomatic her heparin is being restarted, she will be loaded with Plavix and continued on aspirin, her blood pressure severely elevated we're increasing her hydralazine to 50 mg 3 times a day and continue Imdur 60 daily. ACE inhibitor is on hold unable to increase beta blocker if she is bradycardic at baseline. We will also increase Lasix as she is fluid overloaded. Creatinine today 1.0 and that his improvement from 1.2.  Dorothy Spark 07/01/2015

## 2015-07-02 DIAGNOSIS — I110 Hypertensive heart disease with heart failure: Secondary | ICD-10-CM

## 2015-07-02 LAB — BASIC METABOLIC PANEL
Anion gap: 15 (ref 5–15)
BUN: 32 mg/dL — AB (ref 6–20)
CALCIUM: 9.2 mg/dL (ref 8.9–10.3)
CHLORIDE: 105 mmol/L (ref 101–111)
CO2: 19 mmol/L — AB (ref 22–32)
CREATININE: 1.1 mg/dL — AB (ref 0.44–1.00)
GFR calc non Af Amer: 53 mL/min — ABNORMAL LOW (ref >60)
Glucose, Bld: 153 mg/dL — ABNORMAL HIGH (ref 65–99)
Potassium: 3.4 mmol/L — ABNORMAL LOW (ref 3.5–5.1)
SODIUM: 139 mmol/L (ref 135–145)

## 2015-07-02 LAB — CBC
HCT: 38.1 % (ref 36.0–46.0)
HEMOGLOBIN: 12.7 g/dL (ref 12.0–15.0)
MCH: 30.7 pg (ref 26.0–34.0)
MCHC: 33.3 g/dL (ref 30.0–36.0)
MCV: 92 fL (ref 78.0–100.0)
Platelets: 173 10*3/uL (ref 150–400)
RBC: 4.14 MIL/uL (ref 3.87–5.11)
RDW: 15.4 % (ref 11.5–15.5)
WBC: 17.5 10*3/uL — ABNORMAL HIGH (ref 4.0–10.5)

## 2015-07-02 LAB — HEPARIN LEVEL (UNFRACTIONATED): HEPARIN UNFRACTIONATED: 0.59 [IU]/mL (ref 0.30–0.70)

## 2015-07-02 MED ORDER — HYDRALAZINE HCL 20 MG/ML IJ SOLN
10.0000 mg | INTRAMUSCULAR | Status: DC | PRN
  Administered 2015-07-02 – 2015-07-03 (×4): 10 mg via INTRAVENOUS
  Filled 2015-07-02 (×4): qty 1

## 2015-07-02 MED ORDER — ALBUTEROL SULFATE (2.5 MG/3ML) 0.083% IN NEBU: 2.5000 mg | INHALATION_SOLUTION | RESPIRATORY_TRACT | Status: DC | PRN

## 2015-07-02 MED ORDER — MAGNESIUM HYDROXIDE 400 MG/5ML PO SUSP: 15.0000 mL | Freq: Every day | ORAL | Status: DC | PRN

## 2015-07-02 MED ORDER — FUROSEMIDE 40 MG PO TABS
40.0000 mg | ORAL_TABLET | Freq: Two times a day (BID) | ORAL | Status: DC
  Administered 2015-07-02 – 2015-07-04 (×4): 40 mg via ORAL
  Filled 2015-07-02 (×4): qty 1

## 2015-07-02 MED ORDER — ISOSORBIDE MONONITRATE ER 60 MG PO TB24
120.0000 mg | ORAL_TABLET | Freq: Every day | ORAL | Status: DC
  Administered 2015-07-03 – 2015-07-04 (×2): 120 mg via ORAL
  Filled 2015-07-02 (×2): qty 2

## 2015-07-02 MED ORDER — ISOSORBIDE MONONITRATE ER 60 MG PO TB24
60.0000 mg | ORAL_TABLET | Freq: Once | ORAL | Status: AC
  Administered 2015-07-02: 60 mg via ORAL
  Filled 2015-07-02: qty 1

## 2015-07-02 NOTE — Progress Notes (Signed)
Pt complaining that [place] was bothering her nose and making it dry. SpO2 93% on 2L. [place] taken off pt and laid on pillow where pt could reach it and RT explained how to put it on if she felt SOB or needed it. [place] remained turned on at 2L. Pt stated she agreed with the plan. RT will continue to monitor.

## 2015-07-02 NOTE — Progress Notes (Signed)
ANTICOAGULATION CONSULT NOTE - Follow Up Consult  Pharmacy Consult:  Heparin Indication:  ACS  No Known Allergies  Patient Measurements: Height: 5\' 2"  (157.5 cm) Weight: 197 lb (89.359 kg) IBW/kg (Calculated) : 50.1 Heparin Dosing Weight: 70 kg  Vital Signs: Temp: 98.6 F (37 C) (04/23 0425) Temp Source: Oral (04/23 0425) BP: 188/45 mmHg (04/23 0630)  Labs:  Recent Labs  06/30/15 0513 07/01/15 0408 07/01/15 1433 07/02/15 0529 07/02/15 0539  HGB 11.3* 11.4*  --   --  12.7  HCT 35.1* 35.6*  --   --  38.1  PLT 168 154  --   --  173  HEPARINUNFRC 0.58  --  0.37 0.59  --   CREATININE 1.20* 1.09*  --   --  1.10*    Estimated Creatinine Clearance: 55.1 mL/min (by C-G formula based on Cr of 1.1).   Assessment: 60 yo f with ACS transferred from [facility] for further management. Pt is s/p cath with plans for PCI tomorrow 4/24, heparin resumed 8 hours post TR band removal on 4/22.  HL remains therapeutic on 900 units/hr. CBC stable, no issues.  Goal of Therapy:  Heparin level 0.3-0.7 units/ml Monitor platelets by anticoagulation protocol: Yes  Plan:  - Continue heparin infusion at 900 units/hr  - Daily HL / CBC - Follow s/s bleeding and changes to cards plans  Falisha Osment L. Nicole Kindred, PharmD PGY2 Infectious Diseases Pharmacy Resident Pager: [phone] 07/02/2015 10:27 AM

## 2015-07-02 NOTE — Progress Notes (Signed)
Patient Name: Christina Boyle Date of Encounter: 07/02/2015  Principal Problem:   NSTEMI (non-ST elevated myocardial infarction) [facility]) Active Problems:   Hyperlipemia   TOBACCO ABUSE   COARCTATION OF AORTA   Hypertension   COPD exacerbation (HCC)   Acute renal insufficiency   Obesity (BMI 30-39.9)   Hx of CABG 2007   Hypertensive cardiovascular disease   Aortic stenosis-mild to moderate   Primary Cardiologist: Dr Harl Bowie  Patient Profile: 60 y.o. female CABG in 2007, LIMA to LAD, SVG-OM, SVG to PDA, echo EF 60-65% 2014, HTN, moderate aortic stenosis, echo 12/2014 EF 55-60% mean grad 15, AVA VTI 1.36, Coarctation of the aorta, carotid disease, tobacco abuse 1/2 ppd.  SUBJECTIVE: The patient feels better, no chest pain or SON, but mild LE edema.   OBJECTIVE Filed Vitals:   07/02/15 0911 07/02/15 1038 07/02/15 1042 07/02/15 1112  BP:  208/67 208/67 188/62  Pulse:  82    Temp:      TempSrc:      Resp:      Height:      Weight:      SpO2: 92%       Intake/Output Summary (Last 24 hours) at 07/02/15 1245 Last data filed at 07/02/15 1100  Gross per 24 hour  Intake 1279.2 ml  Output   1600 ml  Net -320.8 ml   Filed Weights   06/30/15 0403 07/01/15 0417 07/02/15 0425  Weight: 199 lb 8.3 oz (90.5 kg) 198 lb 13.7 oz (90.2 kg) 197 lb (89.359 kg)    PHYSICAL EXAM General: Well developed, well nourished, female in no acute distress. Head: Normocephalic, atraumatic.  Neck: Supple without bruits, JVD 10 cm. Lungs:  Resp regular and unlabored, decreased BS bases bilaterally. Heart: RRR, S1, S2, no S3, S4, 3/6 murmur; no rub. Abdomen: Soft, non-tender, non-distended, BS + x 4.  Extremities: No clubbing, cyanosis, no edema. Left radial cath site without ecchymosis or hematoma Neuro: Alert and oriented X 3. Moves all extremities spontaneously. Psych: Normal affect.  LABS: CBC:  Recent Labs  07/01/15 0408 07/02/15 0539  WBC 14.0* 17.5*  HGB 11.4* 12.7  HCT 35.6*  38.1  MCV 93.4 92.0  PLT 154 173   INR: No results for input(s): INR in the last 72 hours. Basic Metabolic Panel:  Recent Labs  07/01/15 0408 07/02/15 0539  NA 138 139  K 3.7 3.4*  CL 107 105  CO2 20* 19*  GLUCOSE 154* 153*  BUN 31* 32*  CREATININE 1.09* 1.10*  CALCIUM 9.0 9.2   Lab Results  Component Value Date   CHOL 196 06/29/2014   HDL 45* 06/29/2014   LDLCALC 122* 06/29/2014   TRIG 143 06/29/2014   CHOLHDL 4.4 06/29/2014   Lab Results  Component Value Date   ALT 21 06/28/2015   AST 31 06/28/2015   ALKPHOS 70 06/28/2015   BILITOT 0.5 06/28/2015    TELE:   SR, ST    Cath: 06/30/2015 1. Ost RCA lesion, 95% stenosed. Mid RCA lesion, 50% stenosed. Dist RCA lesion, 70% stenosed. Post Atrio lesion, 80% stenosed. 2. 100% occluded SVG-RPDA at the origin 3. Ost LM to LM lesion, 60% stenosed. 4. Likely ostial left main disease but with brisk antegrade flow in the LAD system. Most notable lesion is the diagonal lesion. 5. 3rd Diag lesion, 80% stenosed. 6. Ost Cx to Prox Cx lesion, 80% stenosed. Ost 1st Mrg to 1st Mrg lesion, 80% stenosed. 7. SVG-OM is widely patent with the  exception of a 99% stenosis in the origin 8. LIMA was visualized by non-selective angiography due to inability to cannulate and . Significant disease in that portion of the left subclavian artery. The patient truly has 2 culprit lesions: Ostial SVG-OM and the occluded SVG-RCA with now ostial RCA disease. Both lesions are likely be difficult PCI. As the diagnostic procedure was somewhat difficult as far as engaging the LIMA graft, and significant amount of contrast was used with recent renal insufficiency, I felt it best to stop the procedure today with plans for having the patient return for 2 site PCI early next week. Plan:  Patient will return to nursing unit for continued medical care.  Would start heparin back 6-8 hours following sheath removal.  I have loaded with Plavix 300 mg and ordered 75  mg daily.  She needs aggressive blood pressure management. I tentatively posted her for PCI on Monday, pending renal function reassessment.  Radiology/Studies: Dg Chest 2 View 06/27/2015  CLINICAL DATA:  60 year old with productive cough, congestion and shortness of breath for 5 days. History of COPD. EXAM: CHEST  2 VIEW COMPARISON:  03/21/2015. FINDINGS: The heart size and mediastinal contours are stable status post CABG. There is stable mild chronic lung disease with central airway thickening and mild hyperinflation. No evidence of edema, airspace disease, pleural effusion or pneumothorax. The bones appear unchanged. IMPRESSION: Stable postoperative chest.  No acute cardiopulmonary process. Electronically Signed   By: Richardean Sale M.D.   On: 06/27/2015 11:18   Current Medications:   . angioplasty book   Does not apply Once  . antiseptic oral rinse  7 mL Mouth Rinse BID  . atorvastatin  80 mg Oral Daily  . clopidogrel  75 mg Oral Q breakfast  . furosemide  40 mg Oral Daily  . hydrALAZINE  50 mg Oral Q8H  . ipratropium  0.5 mg Nebulization TID  . isosorbide mononitrate  60 mg Oral Daily  . levalbuterol  0.63 mg Nebulization TID  . methylPREDNISolone (SOLU-MEDROL) injection  60 mg Intravenous Q6H  . metoprolol  75 mg Oral BID  . nicotine  21 mg Transdermal Daily  . sodium chloride flush  3 mL Intravenous Q12H   . heparin 900 Units/hr (07/02/15 1100)     ASSESSMENT AND PLAN:  Otherwise, continue current therapy Principal Problem:   NSTEMI (non-ST elevated myocardial infarction) ([place]) Active Problems:   Hyperlipemia   TOBACCO ABUSE   COARCTATION OF AORTA   COPD (chronic obstructive pulmonary disease) (HCC)   Acute renal insufficiency   Morbid obesity- BMI 35   Hx of CABG 2007   Hypertensive cardiovascular disease   Aortic stenosis-mild to moderate  60 y.o. female CABG in 2007, LIMA to LAD, SVG-OM, SVG to PDA, echo EF 60-65% 2014, HTN, moderate aortic stenosis, echo 12/2014  EF 55-60% mean grad 15, AVA VTI 1.36, Coarctation of the aorta, carotid disease, tobacco abuse 1/2 ppd, who presented with non-STEMI with maximum 1.65 and underwent left cardiac cath on 06/30/15 that showed 2 culprit lesions , ostial SVG-OM and the occluded SVG-RCA with now ostial RCA disease, both lesions are likely be difficult PCI. The patient is scheduled for dose interventions on Monday.  She is currently asymptomatic, on iv heparin drip, she was oaded with Plavix and continued on aspirin, her blood pressure continues to be severely elevated, I will increase imdur to 120 mg po daily and metoprolol to 100 mg po BID.  We will also increase Lasix to 40 mg po BID  as she is fluid overloaded, crea stable at 1.1.  Dorothy Spark 07/02/2015

## 2015-07-03 ENCOUNTER — Encounter (HOSPITAL_COMMUNITY)
Admission: EM | Disposition: A | Discharge status: Home / Self Care | Payer: Self-pay | Source: Home / Self Care | Attending: Cardiovascular Disease

## 2015-07-03 ENCOUNTER — Encounter (HOSPITAL_COMMUNITY): Payer: Self-pay | Admitting: Cardiology

## 2015-07-03 DIAGNOSIS — I25810 Atherosclerosis of coronary artery bypass graft(s) without angina pectoris: Secondary | ICD-10-CM

## 2015-07-03 HISTORY — PX: CARDIAC CATHETERIZATION: SHX172

## 2015-07-03 LAB — CBC
HEMATOCRIT: 37.1 % (ref 36.0–46.0)
Hemoglobin: 12 g/dL (ref 12.0–15.0)
MCH: 29.6 pg (ref 26.0–34.0)
MCHC: 32.3 g/dL (ref 30.0–36.0)
MCV: 91.6 fL (ref 78.0–100.0)
Platelets: 177 10*3/uL (ref 150–400)
RBC: 4.05 MIL/uL (ref 3.87–5.11)
RDW: 15.1 % (ref 11.5–15.5)
WBC: 17.1 10*3/uL — AB (ref 4.0–10.5)

## 2015-07-03 LAB — BASIC METABOLIC PANEL
ANION GAP: 15 (ref 5–15)
BUN: 29 mg/dL — AB (ref 6–20)
CALCIUM: 9 mg/dL (ref 8.9–10.3)
CO2: 20 mmol/L — AB (ref 22–32)
Chloride: 104 mmol/L (ref 101–111)
Creatinine, Ser: 1.13 mg/dL — ABNORMAL HIGH (ref 0.44–1.00)
GFR calc Af Amer: 59 mL/min — ABNORMAL LOW (ref >60)
GFR calc non Af Amer: 51 mL/min — ABNORMAL LOW (ref >60)
GLUCOSE: 159 mg/dL — AB (ref 65–99)
POTASSIUM: 3.2 mmol/L — AB (ref 3.5–5.1)
Sodium: 139 mmol/L (ref 135–145)

## 2015-07-03 LAB — HEPARIN LEVEL (UNFRACTIONATED): Heparin Unfractionated: 0.78 IU/mL — ABNORMAL HIGH (ref 0.30–0.70)

## 2015-07-03 LAB — POCT ACTIVATED CLOTTING TIME: Activated Clotting Time: 472 seconds

## 2015-07-03 SURGERY — CORONARY STENT INTERVENTION
Anesthesia: LOCAL

## 2015-07-03 MED ORDER — MIDAZOLAM HCL 2 MG/2ML IJ SOLN
INTRAMUSCULAR | Status: AC
  Filled 2015-07-03: qty 2

## 2015-07-03 MED ORDER — SODIUM CHLORIDE 0.9 % IV SOLN
250.0000 mg | INTRAVENOUS | Status: DC | PRN
  Administered 2015-07-03: 1.75 mg/kg/h via INTRAVENOUS

## 2015-07-03 MED ORDER — HYDRALAZINE HCL 20 MG/ML IJ SOLN
INTRAMUSCULAR | Status: DC | PRN
  Administered 2015-07-03: 10 mg via INTRAVENOUS

## 2015-07-03 MED ORDER — HEPARIN (PORCINE) IN NACL 2-0.9 UNIT/ML-% IJ SOLN
INTRAMUSCULAR | Status: DC | PRN
  Administered 2015-07-03: 10 mL via INTRA_ARTERIAL

## 2015-07-03 MED ORDER — HYDRALAZINE HCL 20 MG/ML IJ SOLN
INTRAMUSCULAR | Status: AC
  Filled 2015-07-03: qty 1

## 2015-07-03 MED ORDER — SODIUM CHLORIDE 0.9% FLUSH
3.0000 mL | Freq: Two times a day (BID) | INTRAVENOUS | Status: DC
  Administered 2015-07-04: 3 mL via INTRAVENOUS

## 2015-07-03 MED ORDER — LIDOCAINE HCL (PF) 1 % IJ SOLN
INTRAMUSCULAR | Status: DC | PRN
  Administered 2015-07-03: 3 mL

## 2015-07-03 MED ORDER — SODIUM CHLORIDE 0.9 % WEIGHT BASED INFUSION
3.0000 mL/kg/h | INTRAVENOUS | Status: DC
  Administered 2015-07-03: 3 mL/kg/h via INTRAVENOUS

## 2015-07-03 MED ORDER — HEPARIN (PORCINE) IN NACL 2-0.9 UNIT/ML-% IJ SOLN
INTRAMUSCULAR | Status: DC | PRN
  Administered 2015-07-03: 1500 mL

## 2015-07-03 MED ORDER — SODIUM CHLORIDE 0.9 % IV SOLN: 250.0000 mL | INTRAVENOUS | Status: DC | PRN

## 2015-07-03 MED ORDER — HEPARIN (PORCINE) IN NACL 2-0.9 UNIT/ML-% IJ SOLN
INTRAMUSCULAR | Status: AC
  Filled 2015-07-03: qty 500

## 2015-07-03 MED ORDER — BIVALIRUDIN BOLUS VIA INFUSION - CUPID
INTRAVENOUS | Status: DC | PRN
  Administered 2015-07-03: 66.825 mg via INTRAVENOUS

## 2015-07-03 MED ORDER — HEPARIN (PORCINE) IN NACL 2-0.9 UNIT/ML-% IJ SOLN
INTRAMUSCULAR | Status: AC
  Filled 2015-07-03: qty 1000

## 2015-07-03 MED ORDER — POTASSIUM CHLORIDE CRYS ER 20 MEQ PO TBCR
20.0000 meq | EXTENDED_RELEASE_TABLET | Freq: Once | ORAL | Status: AC
  Administered 2015-07-03: 20 meq via ORAL
  Filled 2015-07-03: qty 1

## 2015-07-03 MED ORDER — ASPIRIN 81 MG PO CHEW
81.0000 mg | CHEWABLE_TABLET | ORAL | Status: AC
  Administered 2015-07-03: 81 mg via ORAL
  Filled 2015-07-03: qty 1

## 2015-07-03 MED ORDER — FENTANYL CITRATE (PF) 100 MCG/2ML IJ SOLN
INTRAMUSCULAR | Status: DC | PRN
  Administered 2015-07-03: 25 ug via INTRAVENOUS

## 2015-07-03 MED ORDER — SODIUM CHLORIDE 0.9% FLUSH: 3.0000 mL | INTRAVENOUS | Status: DC | PRN

## 2015-07-03 MED ORDER — SODIUM CHLORIDE 0.9 % WEIGHT BASED INFUSION
1.0000 mL/kg/h | INTRAVENOUS | Status: DC
Start: 2015-07-03 — End: 2015-07-03
  Administered 2015-07-03: 1 mL/kg/h via INTRAVENOUS

## 2015-07-03 MED ORDER — IOPAMIDOL (ISOVUE-370) INJECTION 76%
INTRAVENOUS | Status: AC
  Filled 2015-07-03: qty 125

## 2015-07-03 MED ORDER — BIVALIRUDIN 250 MG IV SOLR
INTRAVENOUS | Status: AC
  Filled 2015-07-03: qty 250

## 2015-07-03 MED ORDER — MIDAZOLAM HCL 2 MG/2ML IJ SOLN
INTRAMUSCULAR | Status: DC | PRN
  Administered 2015-07-03 (×2): 1 mg via INTRAVENOUS

## 2015-07-03 MED ORDER — FENTANYL CITRATE (PF) 100 MCG/2ML IJ SOLN
INTRAMUSCULAR | Status: AC
  Filled 2015-07-03: qty 2

## 2015-07-03 MED ORDER — NITROGLYCERIN 1 MG/10 ML FOR IR/CATH LAB
INTRA_ARTERIAL | Status: AC
  Filled 2015-07-03: qty 10

## 2015-07-03 MED ORDER — SODIUM CHLORIDE 0.9 % WEIGHT BASED INFUSION
1.0000 mL/kg/h | INTRAVENOUS | Status: AC
Start: 2015-07-03 — End: 2015-07-03

## 2015-07-03 MED ORDER — VERAPAMIL HCL 2.5 MG/ML IV SOLN
INTRAVENOUS | Status: AC
  Filled 2015-07-03: qty 2

## 2015-07-03 MED ORDER — LIDOCAINE HCL (PF) 1 % IJ SOLN
INTRAMUSCULAR | Status: AC
  Filled 2015-07-03: qty 30

## 2015-07-03 MED ORDER — SODIUM CHLORIDE 0.9% FLUSH
3.0000 mL | Freq: Two times a day (BID) | INTRAVENOUS | Status: DC
  Administered 2015-07-03: 3 mL via INTRAVENOUS

## 2015-07-03 MED ORDER — IOPAMIDOL (ISOVUE-370) INJECTION 76%
INTRAVENOUS | Status: DC | PRN
  Administered 2015-07-03: 90 mL via INTRAVENOUS

## 2015-07-03 SURGICAL SUPPLY — 13 items
BALLN EMERGE MR 2.0X12 (BALLOONS) ×2
BALLOON EMERGE MR 2.0X12 (BALLOONS) ×1 IMPLANT
CATH VISTA GUIDE 6FR AL1 (CATHETERS) ×2 IMPLANT
DEVICE RAD COMP TR BAND LRG (VASCULAR PRODUCTS) ×2 IMPLANT
GLIDESHEATH SLEND SS 6F .021 (SHEATH) ×2 IMPLANT
KIT ENCORE 26 ADVANTAGE (KITS) ×2 IMPLANT
KIT HEART LEFT (KITS) ×2 IMPLANT
PACK CARDIAC CATHETERIZATION (CUSTOM PROCEDURE TRAY) ×2 IMPLANT
STENT PROMUS PREM MR 3.5X16 (Permanent Stent) ×2 IMPLANT
TRANSDUCER W/STOPCOCK (MISCELLANEOUS) ×2 IMPLANT
TUBING CIL FLEX 10 FLL-RA (TUBING) ×2 IMPLANT
WIRE ASAHI PROWATER 180CM (WIRE) ×2 IMPLANT
WIRE SAFE-T 1.5MM-J .035X260CM (WIRE) ×2 IMPLANT

## 2015-07-03 NOTE — Interval H&P Note (Signed)
History and Physical Interval Note:  07/03/2015 12:33 PM  Christina Boyle  has presented today for surgery, with the diagnosis of cad  The various methods of treatment have been discussed with the patient and family. After consideration of risks, benefits and other options for treatment, the patient has consented to  Procedure(s): Coronary Stent Intervention (N/A) as a surgical intervention .  The patient's history has been reviewed, patient examined, no change in status, stable for surgery.  I have reviewed the patient's chart and labs.  Questions were answered to the patient's satisfaction.   Cath Lab Visit (complete for each Cath Lab visit)  Clinical Evaluation Leading to the Procedure:   ACS: Yes.    Non-ACS:    Anginal Classification: CCS IV  Anti-ischemic medical therapy: Minimal Therapy (1 class of medications)  Non-Invasive Test Results: No non-invasive testing performed  Prior CABG: Previous CABG        Collier Salina [facility] 07/03/2015 12:33 PM

## 2015-07-03 NOTE — Progress Notes (Signed)
ANTICOAGULATION CONSULT NOTE - Follow Up Consult  Pharmacy Consult:  Heparin Indication:  ACS  No Known Allergies  Patient Measurements: Height: 5\' 2"  (157.5 cm) Weight: 196 lb 6.4 oz (89.086 kg) IBW/kg (Calculated) : 50.1 Heparin Dosing Weight: 70 kg  Vital Signs: Temp: 98.1 F (36.7 C) (04/24 0537) Temp Source: Oral (04/24 0537) BP: 214/61 mmHg (04/24 0537) Pulse Rate: 64 (04/24 0537)  Labs:  Recent Labs  07/01/15 0408 07/01/15 1433 07/02/15 0529 07/02/15 0539 07/03/15 0411  HGB 11.4*  --   --  12.7 12.0  HCT 35.6*  --   --  38.1 37.1  PLT 154  --   --  173 177  HEPARINUNFRC  --  0.37 0.59  --  0.78*  CREATININE 1.09*  --   --  1.10* 1.13*    Estimated Creatinine Clearance: 53.5 mL/min (by C-G formula based on Cr of 1.13).   Assessment: 60 yo f with ACS, s/p cath with plans for PCI today (scheduled at 1200). Heparin level now supratherapeutic on 900 units/hr. CBC stable, no bleeding noted.  Goal of Therapy:  Heparin level 0.3-0.7 units/ml Monitor platelets by anticoagulation protocol: Yes  Plan:  - Decrease heparin to 800 units/hr  - F/u post PCI  Sherlon Handing, PharmD, BCPS Clinical pharmacist, pager [phone] 07/03/2015 5:54 AM

## 2015-07-03 NOTE — Progress Notes (Signed)
Interventional cardiology note:  I have reviewed her cardiac cath films from Friday. She has a patent LIMA to the LAD. The SVG to the OM has a critical ostial stenosis 99%. The SVG to the RCA is also occluded. The native RCA has a severe 90% ostial stenosis with heavy calcification of the native RCA. There is also severe disease at the distal bifurcation.   I think the SVG to OM is her culprit lesion and we will plan on treating this today with DES. I would recommend seeing how her anginal symptoms respond to this. If she continues to have significant angina then I think the RCA should be done later as a staged procedure due to renal insufficiency. This would likely require rotational atherectomy.  The procedure and risks were reviewed including but not limited to death, myocardial infarction, stroke, arrythmias, bleeding, transfusion, emergency surgery, dye allergy, or renal dysfunction. The patient voices understanding and is agreeable to proceed.Marland Kitchen  Peter Martinique MD, [facility]

## 2015-07-03 NOTE — Care Management Note (Signed)
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 Nstemi- post cath 06-30-15. PCI 07-03-15. Pt was transferred to [facility]                    Action/Plan: CM to assist with disposition needs.  Expected Discharge Date:                  Expected Discharge Plan:  Home/Self Care (Patient will transfer to [facility].)  In-House Referral:     Discharge planning Services  CM Consult  Post Acute Care Choice:    Choice offered to:     DME Arranged:    DME Agency:     HH Arranged:    [place] Agency:     Status of Service:  Completed, signed off  Medicare Important Message Given:    Date Medicare IM Given:    Medicare IM give by:    Date Additional Medicare IM Given:    Additional Medicare Important Message give by:     If discussed at [place] of Stay Meetings, dates discussed:    Additional Comments:  Bethena Roys, RN 07/03/2015, 3:19 PM

## 2015-07-03 NOTE — H&P (View-Only) (Signed)
Patient Name: Christina Boyle Date of Encounter: 07/02/2015  Principal Problem:   NSTEMI (non-ST elevated myocardial infarction) [facility]) Active Problems:   Hyperlipemia   TOBACCO ABUSE   COARCTATION OF AORTA   Hypertension   COPD exacerbation (HCC)   Acute renal insufficiency   Obesity (BMI 30-39.9)   Hx of CABG 2007   Hypertensive cardiovascular disease   Aortic stenosis-mild to moderate   Primary Cardiologist: Dr Harl Bowie  Patient Profile: 60 y.o. female CABG in 2007, LIMA to LAD, SVG-OM, SVG to PDA, echo EF 60-65% 2014, HTN, moderate aortic stenosis, echo 12/2014 EF 55-60% mean grad 15, AVA VTI 1.36, Coarctation of the aorta, carotid disease, tobacco abuse 1/2 ppd.  SUBJECTIVE: The patient feels better, no chest pain or SON, but mild LE edema.   OBJECTIVE Filed Vitals:   07/02/15 0911 07/02/15 1038 07/02/15 1042 07/02/15 1112  BP:  208/67 208/67 188/62  Pulse:  82    Temp:      TempSrc:      Resp:      Height:      Weight:      SpO2: 92%       Intake/Output Summary (Last 24 hours) at 07/02/15 1245 Last data filed at 07/02/15 1100  Gross per 24 hour  Intake 1279.2 ml  Output   1600 ml  Net -320.8 ml   Filed Weights   06/30/15 0403 07/01/15 0417 07/02/15 0425  Weight: 199 lb 8.3 oz (90.5 kg) 198 lb 13.7 oz (90.2 kg) 197 lb (89.359 kg)    PHYSICAL EXAM General: Well developed, well nourished, female in no acute distress. Head: Normocephalic, atraumatic.  Neck: Supple without bruits, JVD 10 cm. Lungs:  Resp regular and unlabored, decreased BS bases bilaterally. Heart: RRR, S1, S2, no S3, S4, 3/6 murmur; no rub. Abdomen: Soft, non-tender, non-distended, BS + x 4.  Extremities: No clubbing, cyanosis, no edema. Left radial cath site without ecchymosis or hematoma Neuro: Alert and oriented X 3. Moves all extremities spontaneously. Psych: Normal affect.  LABS: CBC:  Recent Labs  07/01/15 0408 07/02/15 0539  WBC 14.0* 17.5*  HGB 11.4* 12.7  HCT 35.6*  38.1  MCV 93.4 92.0  PLT 154 173   INR: No results for input(s): INR in the last 72 hours. Basic Metabolic Panel:  Recent Labs  07/01/15 0408 07/02/15 0539  NA 138 139  K 3.7 3.4*  CL 107 105  CO2 20* 19*  GLUCOSE 154* 153*  BUN 31* 32*  CREATININE 1.09* 1.10*  CALCIUM 9.0 9.2   Lab Results  Component Value Date   CHOL 196 06/29/2014   HDL 45* 06/29/2014   LDLCALC 122* 06/29/2014   TRIG 143 06/29/2014   CHOLHDL 4.4 06/29/2014   Lab Results  Component Value Date   ALT 21 06/28/2015   AST 31 06/28/2015   ALKPHOS 70 06/28/2015   BILITOT 0.5 06/28/2015    TELE:   SR, ST    Cath: 06/30/2015 1. Ost RCA lesion, 95% stenosed. Mid RCA lesion, 50% stenosed. Dist RCA lesion, 70% stenosed. Post Atrio lesion, 80% stenosed. 2. 100% occluded SVG-RPDA at the origin 3. Ost LM to LM lesion, 60% stenosed. 4. Likely ostial left main disease but with brisk antegrade flow in the LAD system. Most notable lesion is the diagonal lesion. 5. 3rd Diag lesion, 80% stenosed. 6. Ost Cx to Prox Cx lesion, 80% stenosed. Ost 1st Mrg to 1st Mrg lesion, 80% stenosed. 7. SVG-OM is widely patent with the  exception of a 99% stenosis in the origin 8. LIMA was visualized by non-selective angiography due to inability to cannulate and . Significant disease in that portion of the left subclavian artery. The patient truly has 2 culprit lesions: Ostial SVG-OM and the occluded SVG-RCA with now ostial RCA disease. Both lesions are likely be difficult PCI. As the diagnostic procedure was somewhat difficult as far as engaging the LIMA graft, and significant amount of contrast was used with recent renal insufficiency, I felt it best to stop the procedure today with plans for having the patient return for 2 site PCI early next week. Plan:  Patient will return to nursing unit for continued medical care.  Would start heparin back 6-8 hours following sheath removal.  I have loaded with Plavix 300 mg and ordered 75  mg daily.  She needs aggressive blood pressure management. I tentatively posted her for PCI on Monday, pending renal function reassessment.  Radiology/Studies: Dg Chest 2 View 06/27/2015  CLINICAL DATA:  60 year old with productive cough, congestion and shortness of breath for 5 days. History of COPD. EXAM: CHEST  2 VIEW COMPARISON:  03/21/2015. FINDINGS: The heart size and mediastinal contours are stable status post CABG. There is stable mild chronic lung disease with central airway thickening and mild hyperinflation. No evidence of edema, airspace disease, pleural effusion or pneumothorax. The bones appear unchanged. IMPRESSION: Stable postoperative chest.  No acute cardiopulmonary process. Electronically Signed   By: Richardean Sale M.D.   On: 06/27/2015 11:18   Current Medications:   . angioplasty book   Does not apply Once  . antiseptic oral rinse  7 mL Mouth Rinse BID  . atorvastatin  80 mg Oral Daily  . clopidogrel  75 mg Oral Q breakfast  . furosemide  40 mg Oral Daily  . hydrALAZINE  50 mg Oral Q8H  . ipratropium  0.5 mg Nebulization TID  . isosorbide mononitrate  60 mg Oral Daily  . levalbuterol  0.63 mg Nebulization TID  . methylPREDNISolone (SOLU-MEDROL) injection  60 mg Intravenous Q6H  . metoprolol  75 mg Oral BID  . nicotine  21 mg Transdermal Daily  . sodium chloride flush  3 mL Intravenous Q12H   . heparin 900 Units/hr (07/02/15 1100)     ASSESSMENT AND PLAN:  Otherwise, continue current therapy Principal Problem:   NSTEMI (non-ST elevated myocardial infarction) ([place]) Active Problems:   Hyperlipemia   TOBACCO ABUSE   COARCTATION OF AORTA   COPD (chronic obstructive pulmonary disease) (HCC)   Acute renal insufficiency   Morbid obesity- BMI 35   Hx of CABG 2007   Hypertensive cardiovascular disease   Aortic stenosis-mild to moderate  60 y.o. female CABG in 2007, LIMA to LAD, SVG-OM, SVG to PDA, echo EF 60-65% 2014, HTN, moderate aortic stenosis, echo 12/2014  EF 55-60% mean grad 15, AVA VTI 1.36, Coarctation of the aorta, carotid disease, tobacco abuse 1/2 ppd, who presented with non-STEMI with maximum 1.65 and underwent left cardiac cath on 06/30/15 that showed 2 culprit lesions , ostial SVG-OM and the occluded SVG-RCA with now ostial RCA disease, both lesions are likely be difficult PCI. The patient is scheduled for dose interventions on Monday.  She is currently asymptomatic, on iv heparin drip, she was oaded with Plavix and continued on aspirin, her blood pressure continues to be severely elevated, I will increase imdur to 120 mg po daily and metoprolol to 100 mg po BID.  We will also increase Lasix to 40 mg po BID  as she is fluid overloaded, crea stable at 1.1.  Dorothy Spark 07/02/2015

## 2015-07-04 ENCOUNTER — Encounter (HOSPITAL_COMMUNITY): Payer: Self-pay | Admitting: Physician Assistant

## 2015-07-04 LAB — BASIC METABOLIC PANEL
ANION GAP: 11 (ref 5–15)
BUN: 35 mg/dL — AB (ref 6–20)
CALCIUM: 8.5 mg/dL — AB (ref 8.9–10.3)
CO2: 23 mmol/L (ref 22–32)
CREATININE: 1.15 mg/dL — AB (ref 0.44–1.00)
Chloride: 103 mmol/L (ref 101–111)
GFR calc Af Amer: 58 mL/min — ABNORMAL LOW (ref >60)
GFR, EST NON AFRICAN AMERICAN: 50 mL/min — AB (ref >60)
GLUCOSE: 152 mg/dL — AB (ref 65–99)
Potassium: 3.2 mmol/L — ABNORMAL LOW (ref 3.5–5.1)
SODIUM: 137 mmol/L (ref 135–145)

## 2015-07-04 LAB — CBC
HCT: 35.6 % — ABNORMAL LOW (ref 36.0–46.0)
Hemoglobin: 11.7 g/dL — ABNORMAL LOW (ref 12.0–15.0)
MCH: 29.6 pg (ref 26.0–34.0)
MCHC: 32.9 g/dL (ref 30.0–36.0)
MCV: 90.1 fL (ref 78.0–100.0)
Platelets: 166 10*3/uL (ref 150–400)
RBC: 3.95 MIL/uL (ref 3.87–5.11)
RDW: 15 % (ref 11.5–15.5)
WBC: 15.7 10*3/uL — ABNORMAL HIGH (ref 4.0–10.5)

## 2015-07-04 MED ORDER — POTASSIUM CHLORIDE CRYS ER 20 MEQ PO TBCR
40.0000 meq | EXTENDED_RELEASE_TABLET | Freq: Once | ORAL | Status: AC
  Administered 2015-07-04: 40 meq via ORAL
  Filled 2015-07-04: qty 2

## 2015-07-04 MED ORDER — AMLODIPINE BESYLATE 5 MG PO TABS
5.0000 mg | ORAL_TABLET | Freq: Every day | ORAL | Status: DC
  Administered 2015-07-04: 5 mg via ORAL
  Filled 2015-07-04: qty 1

## 2015-07-04 MED ORDER — PREDNISONE 10 MG PO TABS: ORAL_TABLET | ORAL | Status: DC

## 2015-07-04 MED ORDER — FUROSEMIDE 40 MG PO TABS: 40.0000 mg | ORAL_TABLET | Freq: Two times a day (BID) | ORAL | Status: DC

## 2015-07-04 MED ORDER — AMLODIPINE BESYLATE 5 MG PO TABS: 5.0000 mg | ORAL_TABLET | Freq: Every day | ORAL | Status: DC

## 2015-07-04 MED ORDER — HYDRALAZINE HCL 50 MG PO TABS: 50.0000 mg | ORAL_TABLET | Freq: Three times a day (TID) | ORAL | Status: DC

## 2015-07-04 MED ORDER — LISINOPRIL 20 MG PO TABS
20.0000 mg | ORAL_TABLET | Freq: Every day | ORAL | Status: DC
  Administered 2015-07-04: 20 mg via ORAL
  Filled 2015-07-04: qty 1

## 2015-07-04 MED ORDER — LISINOPRIL-HYDROCHLOROTHIAZIDE 20-12.5 MG PO TABS: 1.0000 | ORAL_TABLET | Freq: Every day | ORAL | Status: DC

## 2015-07-04 MED ORDER — METOPROLOL TARTRATE 75 MG PO TABS: 75.0000 mg | ORAL_TABLET | Freq: Two times a day (BID) | ORAL | Status: DC

## 2015-07-04 MED ORDER — CLOPIDOGREL BISULFATE 75 MG PO TABS: 75.0000 mg | ORAL_TABLET | Freq: Every day | ORAL | Status: DC

## 2015-07-04 MED ORDER — LISINOPRIL 20 MG PO TABS: 20.0000 mg | ORAL_TABLET | Freq: Every day | ORAL | Status: DC

## 2015-07-04 MED ORDER — ISOSORBIDE MONONITRATE ER 120 MG PO TB24: 120.0000 mg | ORAL_TABLET | Freq: Every day | ORAL | Status: DC

## 2015-07-04 MED FILL — Nitroglycerin IV Soln 100 MCG/ML in D5W: INTRA_ARTERIAL | Qty: 10 | Status: AC

## 2015-07-04 NOTE — Progress Notes (Addendum)
DAILY PROGRESS NOTE  Subjective:  No events overnight. PCI to ostial SVG to OM - suboptimal result with 20% residual stenosis. No chest pain. BP remains elevated. Plan to defer PCI to the RCA if she develops any more angina.  Objective:  Temp:  [97.4 F (36.3 C)-98.4 F (36.9 C)] 97.4 F (36.3 C) (04/25 0747) Pulse Rate:  [0-78] 64 (04/25 0700) Resp:  [0-33] 18 (04/25 0700) BP: (159-223)/(49-141) 185/63 mmHg (04/25 0700) SpO2:  [0 %-97 %] 94 % (04/25 0739) Weight change:   Intake/Output from previous day: 04/24 0701 - 04/25 0700 In: 559.5 [P.O.:120; I.V.:439.5] Out: 2700 [Urine:2700]  Intake/Output from this shift:    Medications: Current Facility-Administered Medications  Medication Dose Route Frequency Provider Last Rate Last Dose  . 0.9 %  sodium chloride infusion  250 mL Intravenous PRN Leonie Man, MD 10 mL/hr at 07/03/15 1900 250 mL at 07/03/15 1900  . 0.9 %  sodium chloride infusion  250 mL Intravenous PRN Peter M Martinique, MD      . acetaminophen (TYLENOL) tablet 1,000 mg  1,000 mg Oral Q6H PRN Oswald Hillock, MD   1,000 mg at 07/01/15 0040  . albuterol (PROVENTIL) (2.5 MG/3ML) 0.083% nebulizer solution 2.5 mg  2.5 mg Nebulization Q4H PRN Sherren Mocha, MD      . ALPRAZolam Duanne Moron) tablet 0.5 mg  0.5 mg Oral TID PRN Eileen Stanford, PA-C   0.5 mg at 07/03/15 1150  . amLODipine (NORVASC) tablet 5 mg  5 mg Oral Daily Pixie Casino, MD      . angioplasty book   Does not apply Once Sherren Mocha, MD      . antiseptic oral rinse ([place] / CETYLPYRIDINIUM CHLORIDE 0.05%) solution 7 mL  7 mL Mouth Rinse BID Sinda Du, MD   7 mL at 07/03/15 2111  . atorvastatin (LIPITOR) tablet 80 mg  80 mg Oral Daily Oswald Hillock, MD   80 mg at 07/03/15 0752  . clopidogrel (PLAVIX) tablet 75 mg  75 mg Oral Q breakfast Leonie Man, MD   75 mg at 07/04/15 0755  . furosemide (LASIX) tablet 40 mg  40 mg Oral BID Dorothy Spark, MD   40 mg at 07/04/15 0755  . hydrALAZINE  (APRESOLINE) injection 10 mg  10 mg Intravenous Q4H PRN Rogelia Mire, NP   10 mg at 07/03/15 2110  . hydrALAZINE (APRESOLINE) tablet 50 mg  50 mg Oral Q8H Rhonda G Barrett, PA-C   50 mg at 07/04/15 0518  . ipratropium (ATROVENT) nebulizer solution 0.5 mg  0.5 mg Nebulization TID Sherren Mocha, MD   0.5 mg at 07/04/15 0737  . isosorbide mononitrate (IMDUR) 24 hr tablet 120 mg  120 mg Oral Daily Dorothy Spark, MD   120 mg at 07/03/15 0752  . levalbuterol (XOPENEX) nebulizer solution 0.63 mg  0.63 mg Nebulization TID Sherren Mocha, MD   0.63 mg at 07/04/15 0737  . lisinopril (PRINIVIL,ZESTRIL) tablet 20 mg  20 mg Oral Daily Pixie Casino, MD      . magnesium hydroxide (MILK OF MAGNESIA) suspension 15 mL  15 mL Oral Daily PRN Rogelia Mire, NP      . Melatonin TABS 3 mg  3 mg Oral QHS PRN Sinda Du, MD   3 mg at 06/29/15 2200  . methylPREDNISolone sodium succinate (SOLU-MEDROL) 125 mg/2 mL injection 60 mg  60 mg Intravenous Q6H Oswald Hillock, MD   60 mg at 07/04/15  0518  . metoprolol tartrate (LOPRESSOR) tablet 75 mg  75 mg Oral BID Oswald Hillock, MD   75 mg at 07/03/15 2111  . nicotine (NICODERM CQ - dosed in mg/24 hours) patch 21 mg  21 mg Transdermal Daily Oswald Hillock, MD   21 mg at 07/03/15 0755  . ondansetron (ZOFRAN) tablet 4 mg  4 mg Oral Q6H PRN Oswald Hillock, MD       Or  . ondansetron (ZOFRAN) injection 4 mg  4 mg Intravenous Q6H PRN Oswald Hillock, MD      . phenol (CHLORASEPTIC) mouth spray 1 spray  1 spray Mouth/Throat PRN Sinda Du, MD   1 spray at 06/30/15 1205  . sodium chloride flush (NS) 0.9 % injection 3 mL  3 mL Intravenous Q12H Leonie Man, MD   3 mL at 07/03/15 2112  . sodium chloride flush (NS) 0.9 % injection 3 mL  3 mL Intravenous PRN Leonie Man, MD      . sodium chloride flush (NS) 0.9 % injection 3 mL  3 mL Intravenous Q12H Peter M Martinique, MD   3 mL at 07/03/15 2111  . sodium chloride flush (NS) 0.9 % injection 3 mL  3 mL Intravenous PRN  Peter M Martinique, MD        Physical Exam: General appearance: alert and no distress Neck: no carotid bruit and no JVD Lungs: clear to auscultation bilaterally Heart: regular rate and rhythm, S1, S2 normal and systolic murmur: early systolic 3/6, crescendo at 2nd right intercostal space Abdomen: soft, non-tender; bowel sounds normal; no masses,  no organomegaly Extremities: extremities normal, atraumatic, no cyanosis or edema and radial cath site without bruit, +pulse Pulses: 2+ and symmetric Skin: Skin color, texture, turgor normal. No rashes or lesions Neurologic: Grossly normal  Lab Results: Results for orders placed or performed during the hospital encounter of 06/27/15 (from the past 48 hour(s))  Heparin level (unfractionated)     Status: Abnormal   Collection Time: 07/03/15  4:11 AM  Result Value Ref Range   Heparin Unfractionated 0.78 (H) 0.30 - 0.70 IU/mL    Comment:        IF HEPARIN RESULTS ARE BELOW EXPECTED VALUES, AND PATIENT DOSAGE HAS BEEN CONFIRMED, SUGGEST FOLLOW UP TESTING OF ANTITHROMBIN III LEVELS.   CBC     Status: Abnormal   Collection Time: 07/03/15  4:11 AM  Result Value Ref Range   WBC 17.1 (H) 4.0 - 10.5 K/uL   RBC 4.05 3.87 - 5.11 MIL/uL   Hemoglobin 12.0 12.0 - 15.0 g/dL   HCT 37.1 36.0 - 46.0 %   MCV 91.6 78.0 - 100.0 fL   MCH 29.6 26.0 - 34.0 pg   MCHC 32.3 30.0 - 36.0 g/dL   RDW 15.1 11.5 - 15.5 %   Platelets 177 150 - 400 K/uL  Basic metabolic panel     Status: Abnormal   Collection Time: 07/03/15  4:11 AM  Result Value Ref Range   Sodium 139 135 - 145 mmol/L   Potassium 3.2 (L) 3.5 - 5.1 mmol/L   Chloride 104 101 - 111 mmol/L   CO2 20 (L) 22 - 32 mmol/L   Glucose, Bld 159 (H) 65 - 99 mg/dL   BUN 29 (H) 6 - 20 mg/dL   Creatinine, Ser 1.13 (H) 0.44 - 1.00 mg/dL   Calcium 9.0 8.9 - 10.3 mg/dL   GFR calc non Af Amer 51 (L) >60 mL/min   GFR calc  Af Amer 59 (L) >60 mL/min    Comment: (NOTE) The eGFR has been calculated using the CKD EPI  equation. This calculation has not been validated in all clinical situations. eGFR's persistently <60 mL/min signify possible Chronic Kidney Disease.    Anion gap 15 5 - 15  POCT Activated clotting time     Status: None   Collection Time: 07/03/15  1:06 PM  Result Value Ref Range   Activated Clotting Time 472 seconds  Basic metabolic panel     Status: Abnormal   Collection Time: 07/04/15  3:08 AM  Result Value Ref Range   Sodium 137 135 - 145 mmol/L   Potassium 3.2 (L) 3.5 - 5.1 mmol/L   Chloride 103 101 - 111 mmol/L   CO2 23 22 - 32 mmol/L   Glucose, Bld 152 (H) 65 - 99 mg/dL   BUN 35 (H) 6 - 20 mg/dL   Creatinine, Ser 1.15 (H) 0.44 - 1.00 mg/dL   Calcium 8.5 (L) 8.9 - 10.3 mg/dL   GFR calc non Af Amer 50 (L) >60 mL/min   GFR calc Af Amer 58 (L) >60 mL/min    Comment: (NOTE) The eGFR has been calculated using the CKD EPI equation. This calculation has not been validated in all clinical situations. eGFR's persistently <60 mL/min signify possible Chronic Kidney Disease.    Anion gap 11 5 - 15  CBC     Status: Abnormal   Collection Time: 07/04/15  3:08 AM  Result Value Ref Range   WBC 15.7 (H) 4.0 - 10.5 K/uL   RBC 3.95 3.87 - 5.11 MIL/uL   Hemoglobin 11.7 (L) 12.0 - 15.0 g/dL   HCT 35.6 (L) 36.0 - 46.0 %   MCV 90.1 78.0 - 100.0 fL   MCH 29.6 26.0 - 34.0 pg   MCHC 32.9 30.0 - 36.0 g/dL   RDW 15.0 11.5 - 15.5 %   Platelets 166 150 - 400 K/uL    Imaging: No results found.  Assessment:  1. Principal Problem: 2.   NSTEMI (non-ST elevated myocardial infarction) ([place]) 3. Active Problems: 4.   Hyperlipemia 5.   TOBACCO ABUSE 6.   COARCTATION OF AORTA 7.   Hypertension 8.   COPD exacerbation ([place]) 9.   Acute renal insufficiency 10.   Obesity (BMI 30-39.9) 11.   Hx of CABG 2007 12.   Hypertensive cardiovascular disease 13.   Aortic stenosis-mild to moderate 14.   Plan:  1. Feels well today, however, blood pressure remains poorly controlled. Will restart home  lisinopril 20 mg daily. Stop HCTZ - she is on lasix. Add amlodipine 5 mg daily. Likely okay for d/c home today if BP better than 350 systolic at time of discharge. Follow-up with Dr. Harl Bowie in [place] in 7-10 days.  Time Spent Directly with Patient:  15 minutes  Length of Stay:  LOS: 6 days   Pixie Casino, MD, [facility] Attending Cardiologist [place] 07/04/2015, 7:57 AM

## 2015-07-04 NOTE — Progress Notes (Signed)
CARDIAC REHAB PHASE I   PRE:  Rate/Rhythm: 56 SB    BP: sitting 170/70 manually    SaO2:   MODE:  Ambulation: 310 ft   POST:  Rate/Rhythm: 63 SR    BP: sitting 194/78 manually, recheck 25 min later 164/74     SaO2: 95 RA  Pt weak and slightly wobbly at first. Sts she has been in the bed all week. Steadiness improved with distance. Pt with SOB with distance, rest x2. Sts this is her baseline due to COPD. Denied CP. BP elevated after walk however after discussing her education BP down to 164/74. Ed completed including importance of Plavix/asa. Pt is ready to quit smoking and I gave her resources. Interested in [facility] and will send referral to [facility].  [place], ACSM 07/04/2015 12:15 PM

## 2015-07-04 NOTE — Progress Notes (Signed)
Pt discharged to home with all belongings. Transported to car per wheelchair and family members with pt.

## 2015-07-04 NOTE — Discharge Summary (Signed)
Discharge Summary    Patient ID: Christina Boyle,  MRN: [id number], DOB/AGE: 62/26/1957 62 y.o.  Admit date: 06/27/2015 Discharge date: 07/04/2015  Primary Care Provider: [name],[name] Primary Cardiologist: Dr. Harl Bowie  Discharge Diagnoses    Principal Problem:   NSTEMI (non-ST elevated myocardial infarction) [facility]) Active Problems:   Hyperlipemia   TOBACCO ABUSE   COARCTATION OF AORTA   Hypertension, uncontrolled   COPD exacerbation (H[facility])   Acute renal insufficiency   Obesity (BMI 30-39.9)   Hx of CABG 2007   Hypertensive cardiovascular disease   Aortic stenosis-mild to moderate   Allergies No Known Allergies  Diagnostic Studies/Procedures    Echo 06/28/15 LV EF: 65% - 70%  ------------------------------------------------------------------- Indications: Chest pain 786.51.  ------------------------------------------------------------------- History: PMH: Chronic obstructive pulmonary disease. Risk factors: Current tobacco use. Hypertension. Dyslipidemia.  ------------------------------------------------------------------- Study Conclusions  - Left ventricle: The cavity size was normal. Wall thickness was  increased in a pattern of moderate LVH. Systolic function was  vigorous. The estimated ejection fraction was in the range of 65%  to 70%. Wall motion was normal; there were no regional wall  motion abnormalities. Doppler parameters are consistent with  abnormal left ventricular relaxation (grade 1 diastolic  dysfunction). Doppler parameters are consistent with high  ventricular filling pressure. - Aortic valve: Moderately calcified annulus. Trileaflet; mildly  thickened, mildly calcified leaflets. There was mild to moderate  stenosis. Peak velocity (S): 237 cm/s. Mean gradient (S): 14 mm  Hg. Valve area (VTI): 1.56 cm^2. Valve area (Vmax): 1.46 cm^2.  Valve area (Vmean): 1.43 cm^2. - Mitral valve: Mildly calcified  annulus. - Right ventricle: Systolic function was mildly reduced.  Cath: 06/30/2015  Ost RCA lesion, 95% stenosed. Mid RCA lesion, 50% stenosed. Dist RCA lesion, 70% stenosed. Post Atrio lesion, 80% stenosed.  100% occluded SVG-RPDA at the origin  Ost LM to LM lesion, 60% stenosed.  Likely ostial left main disease but with brisk antegrade flow in the LAD system. Most notable lesion is the diagonal lesion.  3rd Diag lesion, 80% stenosed.  Ost Cx to Prox Cx lesion, 80% stenosed. Ost 1st Mrg to 1st Mrg lesion, 80% stenosed.  SVG-OM is widely patent with the exception of a 99% stenosis in the origin  LIMA was visualized by non-selective angiography due to inability to cannulate and . Significant disease in that portion of the left subclavian artery. The patient truly has 2 culprit lesions: Ostial SVG-OM and the occluded SVG-RCA with now ostial RCA disease. Both lesions are likely be difficult PCI. As the diagnostic procedure was somewhat difficult as far as engaging the LIMA graft, and significant amount of contrast was used with recent renal insufficiency, I felt it best to stop the procedure today with plans for having the patient return for 2 site PCI early next week. Plan:  Patient will return to nursing unit for continued medical care.  Would start heparin back 6-8 hours following sheath removal.  I have loaded with Plavix 300 mg and ordered 75 mg daily.  She needs aggressive blood pressure management. I tentatively posted her for PCI on Monday, pending renal function reassessment.   Coronary Stent Intervention 06/30/15    Conclusion     Origin lesion, 99% stenosed. Post intervention, there is a 20% residual stenosis.  1. Successful but suboptimal stenting of the ostial SVG to the OM due to difficulty positioning the stent at the origin. As a result the stent was deployed but only the distal portion of the stent was  in the SVG.   Plan: continue DAPT indefinitely. Would  postpone any further PCI and allow stent to re- endothelialize. If she does have refractory symptoms I would consider her for PCI of the native RCA with rotational atherectomy at a later date. If asymptomatic I would treat this medically.      History of Present Illness     Christina Boyle is an 60 y.o. female with multivessel CAD status post CABG in 2007, just saw Dr. Harl Bowie in office 06/12/15 and doing well. History of LIMA to LAD, SVG-OM, SVG to PDA, echo EF 60-65% 2014, 01/2014 exercise MPI didn't reach THR converted to [place]. Apical anterior and anterolateral ischemia, intermediate risk LVEF 62%. Started on Imdur 30 mg daily with improvement of symptoms. Also has HTN, moderate aortic stenosis, echo 12/2014 EF 55-60% mean grad 15, AVA VTI 1.36, Coarctation of the aorta, mild to moderate AS, carotid disease, tobacco abuse 1/2 ppd who initially came to [facility] 06/[facility]15 with CP and SOB and later transferred to [facility] with NSTEMI.   Patient complains of feeling bad for 5 days - coughing like bronchitis, head hurting, nausea, vomiting. Next had left chest tightness with left jaw pain lasted several hours. Didn't think to use a NTG. She has chest tightness several times a month, and states that it is getting more frequent. Occurs at rest or with exertion. Felt somewhat better in ER with nebulizer and NTG. ECG shows sinus tachycardia at 121/m with diffuse ST depression inf/lat witch appeared chronic.   She is admitted to the [facility]  with chest tightness and bronchitis symptoms, reporting some cough and shortness of breath, no fevers or chills.     Hospital Course     Consultants @ [facility]:  Pulmonary and family medicine   The patient was initially admitted to [facility] on family medicine service for COPD exacerbation and started on start Solu-Medrol 60 mg IV every 6 hours, Xopenex nebulizers every 6 hours and ipratropium nebulizer every 6 hours.  On admission her troponin was 0.01 which increased to 1.65 next leading to  evaluation by cardiologist. Echo 06/28/15 showed LV EF of 65-70%, moderate LVH, no wm abnormality, grade 1 DD, mild to moderate AS. Due to enzymatic evidence of NSTEMI with recent progressive and more prolonged chest tightness consistent with angina she was transferred to [facility] for cardiac catheterization. ACE inhibitor was held and she was hydrated temporarily due to minimally elevated creatinine. She is also started on IV heparin. Cath 4/21  showed 2 culprit lesions, ostial SVG-OM and the occluded SVG-RCA with now ostial RCA disease, both lesions are likely be difficult PCI. Due tos ignificant amount of contrast was used with recent renal insufficiency, plan made to bring her back for PCI. Due to elevated BP her medication were adjusted. She is s/p PCI to ostial SVG to OM - suboptimal result with 20% residual stenosis. Plan to defer PCI to the RCA if she develops any more angina. She ambulated well with cardiac rehab. No further chest pain. Due to uncontrolled BP --> restart home lisinopril 20 mg daily. Stop HCTZ - she is on lasix. Add amlodipine 5 mg daily. Titrate medication as needed.   The patient has been seen by Dr. Debara Pickett today and deemed ready for discharge home. All follow-up appointments have been scheduled. Discharge medications are listed below.   If BP still elevated during TCM, consider renal artery vascular ultrasound to rule-out stenosis or a 24-hour urine protein/electrolyte. She is willing to quit and she has  nicotine patch Rx for PCP which she will use. She was treated with IV steroid 60mg  q 6 hours during admission for COPD exacerbation> will taper down the dose.      Discharge Vitals Blood pressure 180/65, pulse 60, temperature 97.4 F (36.3 C), temperature source Oral, resp. rate 17, height 5\' 2"  (1.575 m), weight 196 lb 6.4 oz (89.086 kg), SpO2 93 %.  Filed Weights   07/01/15 0417 07/02/15 0425 07/03/15 0542  Weight: 198 lb 13.7 oz (90.2 kg) 197 lb (89.359 kg) 196 lb 6.4 oz (89.086  kg)    Labs & Radiologic Studies     CBC  Recent Labs  07/03/15 0411 07/04/15 0308  WBC 17.1* 15.7*  HGB 12.0 11.7*  HCT 37.1 35.6*  MCV 91.6 90.1  PLT 177 [id number]   Basic Metabolic Panel  Recent Labs  07/03/15 0411 07/04/15 0308  NA 139 137  K 3.2* 3.2*  CL 104 103  CO2 20* 23  GLUCOSE 159* 152*  BUN 29* 35*  CREATININE 1.13* 1.15*  CALCIUM 9.0 8.5*   Liver Function Tests No results for input(s): AST, ALT, ALKPHOS, BILITOT, PROT, ALBUMIN in the last 72 hours. No results for input(s): LIPASE, AMYLASE in the last 72 hours. Cardiac Enzymes No results for input(s): CKTOTAL, CKMB, CKMBINDEX, TROPONINI in the last 72 hours. BNP Invalid input(s): POCBNP D-Dimer No results for input(s): DDIMER in the last 72 hours. Hemoglobin A1C No results for input(s): HGBA1C in the last 72 hours. Fasting Lipid Panel No results for input(s): CHOL, HDL, LDLCALC, TRIG, CHOLHDL, LDLDIRECT in the last 72 hours. Thyroid Function Tests No results for input(s): TSH, T4TOTAL, T3FREE, THYROIDAB in the last 72 hours.  Invalid input(s): FREET3  Dg Chest 2 View  07/01/2015  CLINICAL DATA:  Shortness of breath beginning yesterday. COPD. Coronary artery disease. EXAM: CHEST  2 VIEW COMPARISON:  06/27/2015 FINDINGS: The heart size and mediastinal contours are within normal limits. Both lungs remain clear allowing for ladder radiograph technique. No evidence of pulmonary consolidation or pleural effusion. Prior CABG again noted. The visualized skeletal structures are unremarkable. IMPRESSION: No active cardiopulmonary disease. Electronically Signed   By: Earle Gell M.D.   On: 07/01/2015 09:39   Dg Chest 2 View  06/27/2015  CLINICAL DATA:  60 year old with productive cough, congestion and shortness of breath for 5 days. History of COPD. EXAM: CHEST  2 VIEW COMPARISON:  03/21/2015. FINDINGS: The heart size and mediastinal contours are stable status post CABG. There is stable mild chronic lung disease  with central airway thickening and mild hyperinflation. No evidence of edema, airspace disease, pleural effusion or pneumothorax. The bones appear unchanged. IMPRESSION: Stable postoperative chest.  No acute cardiopulmonary process. Electronically Signed   By: Richardean Sale M.D.   On: 06/27/2015 11:18    Disposition   Pt is being discharged home today in good condition.  Follow-up Plans & Appointments    Follow-up Information    Follow up with Jory Sims, NP. Go on 07/14/2015.   Specialties:  Nurse Practitioner, Radiology, Cardiology   Why:  @1 :50 for TCM    Contact information:   [place] [address] [phone]      Discharge Instructions    AMB Referral to Cardiac Rehabilitation - Phase II    Complete by:  As directed   Diagnosis:  NSTEMI     Amb Referral to Cardiac Rehabilitation    Complete by:  As directed   Diagnosis:   Coronary Stents PTCA  NSTEMI       Diet - low sodium heart healthy    Complete by:  As directed      Discharge instructions    Complete by:  As directed   NO HEAVY LIFTING (>10lbs) X 2 WEEKS. NO SEXUAL ACTIVITY X 2 WEEKS. NO DRIVING X 1 WEEK. NO SOAKING BATHS, HOT TUBS, POOLS, ETC., X 7 DAYS. Do not return to work until seen in clinic     Increase activity slowly    Complete by:  As directed            Discharge Medications   Current Discharge Medication List    START taking these medications   Details  amLODipine (NORVASC) 5 MG tablet Take 1 tablet (5 mg total) by mouth daily. Qty: 30 tablet, Refills: 6    clopidogrel (PLAVIX) 75 MG tablet Take 1 tablet (75 mg total) by mouth daily with breakfast. Qty: 30 tablet, Refills: 11    furosemide (LASIX) 40 MG tablet Take 1 tablet (40 mg total) by mouth 2 (two) times daily. Qty: 60 tablet, Refills: 3    hydrALAZINE (APRESOLINE) 50 MG tablet Take 1 tablet (50 mg total) by mouth every 8 (eight) hours. Qty: 90 tablet, Refills: 6    lisinopril (PRINIVIL,ZESTRIL) 20 MG  tablet Take 1 tablet (20 mg total) by mouth daily. Qty: 30 tablet, Refills: 6    predniSONE (DELTASONE) 10 MG tablet Take 6 tablets (60mg ) 4/25 and 4/26 then 5 tablets (50mg ) 4/27 and 4/28 then 4 tables 4/29 and 4/30 then 3 tablets (30mg ) 5/1 and 5/2 then 2 tablet (20mg ) 5/3 and 5/4 then 1 tablet (10mg ) 5/5 and 5/6 by mouth as described Qty: 32 tablet, Refills: 0      CONTINUE these medications which have CHANGED   Details  isosorbide mononitrate (IMDUR) 120 MG 24 hr tablet Take 1 tablet (120 mg total) by mouth daily. Qty: 30 tablet, Refills: 6    metoprolol 75 MG TABS Take 75 mg by mouth 2 (two) times daily. Qty: 60 tablet, Refills: 6      CONTINUE these medications which have NOT CHANGED   Details  aspirin EC 81 MG EC tablet Take 1 tablet (81 mg total) by mouth daily.    atorvastatin (LIPITOR) 80 MG tablet TAKE ONE TABLET BY MOUTH ONCE DAILY Qty: 90 tablet, Refills: 3    nitroGLYCERIN (NITROSTAT) 0.4 MG SL tablet Place 1 tablet (0.4 mg total) under the tongue every 5 (five) minutes as needed for chest pain. Qty: 25 tablet, Refills: 3    Tiotropium Bromide Monohydrate (SPIRIVA RESPIMAT) 2.5 MCG/ACT AERS Inhale 1 puff into the lungs as needed.     acetaminophen (TYLENOL) 500 MG tablet Take 1,000 mg by mouth every 6 (six) hours as needed for moderate pain.    albuterol (PROVENTIL HFA;VENTOLIN HFA) 108 (90 Base) MCG/ACT inhaler Inhale 1-2 puffs into the lungs every 6 (six) hours as needed for wheezing or shortness of breath.    nicotine (NICODERM CQ) 14 mg/24hr patch PLACE 1 14MG  PATCH DAILY FOR 2 WEEKS Qty: 14 patch, Refills: 0    nicotine (NICODERM CQ) 21 mg/24hr patch PLACE 1 21 MG PATCH DAILY FOR 6 WEEKS Qty: 42 patch, Refills: 0    nicotine (NICODERM CQ) 7 mg/24hr patch PLACE 1 7 MG PATCH DAILY FOR 2 WEEKS Qty: 14 patch, Refills: 0      STOP taking these medications     doxycycline (VIBRAMYCIN) 100 MG capsule      lisinopril-hydrochlorothiazide (PRINZIDE,ZESTORETIC)  20-12.5 MG tablet          Aspirin prescribed at discharge?  Yes High Intensity Statin Prescribed? (Lipitor 40-80mg  or Crestor 20-40mg ): Yes Beta Blocker Prescribed? Yes For EF 45% or less, Was ACEI/ARB Prescribed? Yes, EF of 65-70% ADP Receptor Inhibitor Prescribed? (i.e. Plavix etc.-Includes Medically Managed Patients): Yes For EF <40%, Aldosterone Inhibitor Prescribed? N/A Was EF assessed during THIS hospitalization? Yes Was Cardiac Rehab II ordered? (Included Medically managed Patients): Yes   Outstanding Labs/Studies   BMET during TCM  Duration of Discharge Encounter   Greater than 30 minutes including physician time.  Signed, Kailin Leu PA-C 07/04/2015, 1:08 PM

## 2015-07-11 ENCOUNTER — Encounter (HOSPITAL_COMMUNITY): Payer: Self-pay | Admitting: Emergency Medicine

## 2015-07-11 ENCOUNTER — Inpatient Hospital Stay (HOSPITAL_COMMUNITY)
Admission: EM | Admit: 2015-07-11 | Discharge: 2015-07-15 | DRG: 377 | Disposition: A | Discharge status: Home Health | Payer: Medicaid Other | Attending: Pulmonary Disease | Admitting: Pulmonary Disease

## 2015-07-11 ENCOUNTER — Emergency Department (HOSPITAL_COMMUNITY): Payer: Medicaid Other

## 2015-07-11 DIAGNOSIS — F17210 Nicotine dependence, cigarettes, uncomplicated: Secondary | ICD-10-CM | POA: Diagnosis present

## 2015-07-11 DIAGNOSIS — N183 Chronic kidney disease, stage 3 unspecified: Secondary | ICD-10-CM | POA: Diagnosis present

## 2015-07-11 DIAGNOSIS — Z955 Presence of coronary angioplasty implant and graft: Secondary | ICD-10-CM

## 2015-07-11 DIAGNOSIS — B3781 Candidal esophagitis: Secondary | ICD-10-CM | POA: Diagnosis present

## 2015-07-11 DIAGNOSIS — E861 Hypovolemia: Secondary | ICD-10-CM | POA: Diagnosis present

## 2015-07-11 DIAGNOSIS — K922 Gastrointestinal hemorrhage, unspecified: Secondary | ICD-10-CM | POA: Diagnosis not present

## 2015-07-11 DIAGNOSIS — I129 Hypertensive chronic kidney disease with stage 1 through stage 4 chronic kidney disease, or unspecified chronic kidney disease: Secondary | ICD-10-CM | POA: Diagnosis present

## 2015-07-11 DIAGNOSIS — I25119 Atherosclerotic heart disease of native coronary artery with unspecified angina pectoris: Secondary | ICD-10-CM | POA: Diagnosis not present

## 2015-07-11 DIAGNOSIS — N179 Acute kidney failure, unspecified: Secondary | ICD-10-CM | POA: Diagnosis present

## 2015-07-11 DIAGNOSIS — Z7982 Long term (current) use of aspirin: Secondary | ICD-10-CM

## 2015-07-11 DIAGNOSIS — K219 Gastro-esophageal reflux disease without esophagitis: Secondary | ICD-10-CM | POA: Diagnosis present

## 2015-07-11 DIAGNOSIS — Z951 Presence of aortocoronary bypass graft: Secondary | ICD-10-CM

## 2015-07-11 DIAGNOSIS — Z8541 Personal history of malignant neoplasm of cervix uteri: Secondary | ICD-10-CM | POA: Diagnosis not present

## 2015-07-11 DIAGNOSIS — Z7901 Long term (current) use of anticoagulants: Secondary | ICD-10-CM

## 2015-07-11 DIAGNOSIS — F329 Major depressive disorder, single episode, unspecified: Secondary | ICD-10-CM | POA: Diagnosis present

## 2015-07-11 DIAGNOSIS — Z8711 Personal history of peptic ulcer disease: Secondary | ICD-10-CM

## 2015-07-11 DIAGNOSIS — Z803 Family history of malignant neoplasm of breast: Secondary | ICD-10-CM

## 2015-07-11 DIAGNOSIS — J449 Chronic obstructive pulmonary disease, unspecified: Secondary | ICD-10-CM | POA: Diagnosis present

## 2015-07-11 DIAGNOSIS — K269 Duodenal ulcer, unspecified as acute or chronic, without hemorrhage or perforation: Secondary | ICD-10-CM | POA: Diagnosis present

## 2015-07-11 DIAGNOSIS — K228 Other specified diseases of esophagus: Secondary | ICD-10-CM | POA: Diagnosis not present

## 2015-07-11 DIAGNOSIS — E785 Hyperlipidemia, unspecified: Secondary | ICD-10-CM | POA: Diagnosis present

## 2015-07-11 DIAGNOSIS — E669 Obesity, unspecified: Secondary | ICD-10-CM | POA: Diagnosis present

## 2015-07-11 DIAGNOSIS — Z7902 Long term (current) use of antithrombotics/antiplatelets: Secondary | ICD-10-CM | POA: Diagnosis not present

## 2015-07-11 DIAGNOSIS — Z8249 Family history of ischemic heart disease and other diseases of the circulatory system: Secondary | ICD-10-CM | POA: Diagnosis not present

## 2015-07-11 DIAGNOSIS — I959 Hypotension, unspecified: Secondary | ICD-10-CM | POA: Diagnosis present

## 2015-07-11 DIAGNOSIS — D696 Thrombocytopenia, unspecified: Secondary | ICD-10-CM | POA: Diagnosis present

## 2015-07-11 DIAGNOSIS — K259 Gastric ulcer, unspecified as acute or chronic, without hemorrhage or perforation: Secondary | ICD-10-CM | POA: Diagnosis not present

## 2015-07-11 DIAGNOSIS — K921 Melena: Principal | ICD-10-CM | POA: Diagnosis present

## 2015-07-11 DIAGNOSIS — R7989 Other specified abnormal findings of blood chemistry: Secondary | ICD-10-CM | POA: Diagnosis not present

## 2015-07-11 DIAGNOSIS — D649 Anemia, unspecified: Secondary | ICD-10-CM

## 2015-07-11 DIAGNOSIS — K625 Hemorrhage of anus and rectum: Secondary | ICD-10-CM | POA: Diagnosis present

## 2015-07-11 DIAGNOSIS — I214 Non-ST elevation (NSTEMI) myocardial infarction: Secondary | ICD-10-CM | POA: Diagnosis not present

## 2015-07-11 DIAGNOSIS — Z6834 Body mass index (BMI) 34.0-34.9, adult: Secondary | ICD-10-CM

## 2015-07-11 DIAGNOSIS — E860 Dehydration: Secondary | ICD-10-CM | POA: Diagnosis present

## 2015-07-11 DIAGNOSIS — F17200 Nicotine dependence, unspecified, uncomplicated: Secondary | ICD-10-CM | POA: Diagnosis present

## 2015-07-11 DIAGNOSIS — I2510 Atherosclerotic heart disease of native coronary artery without angina pectoris: Secondary | ICD-10-CM | POA: Diagnosis present

## 2015-07-11 DIAGNOSIS — D62 Acute posthemorrhagic anemia: Secondary | ICD-10-CM | POA: Diagnosis present

## 2015-07-11 DIAGNOSIS — Z801 Family history of malignant neoplasm of trachea, bronchus and lung: Secondary | ICD-10-CM

## 2015-07-11 HISTORY — DX: Essential (primary) hypertension: I10

## 2015-07-11 HISTORY — DX: Atherosclerotic heart disease of native coronary artery without angina pectoris: I25.10

## 2015-07-11 HISTORY — DX: Non-ST elevation (NSTEMI) myocardial infarction: I21.4

## 2015-07-11 LAB — CBC WITH DIFFERENTIAL/PLATELET
BASOS PCT: 0 %
Basophils Absolute: 0 10*3/uL (ref 0.0–0.1)
EOS ABS: 0 10*3/uL (ref 0.0–0.7)
EOS PCT: 0 %
HCT: 24.8 % — ABNORMAL LOW (ref 36.0–46.0)
Hemoglobin: 8.1 g/dL — ABNORMAL LOW (ref 12.0–15.0)
LYMPHS ABS: 1.2 10*3/uL (ref 0.7–4.0)
Lymphocytes Relative: 6 %
MCH: 30.5 pg (ref 26.0–34.0)
MCHC: 32.7 g/dL (ref 30.0–36.0)
MCV: 93.2 fL (ref 78.0–100.0)
MONOS PCT: 4 %
Monocytes Absolute: 0.9 10*3/uL (ref 0.1–1.0)
Neutro Abs: 19.4 10*3/uL — ABNORMAL HIGH (ref 1.7–7.7)
Neutrophils Relative %: 90 %
PLATELETS: 163 10*3/uL (ref 150–400)
RBC: 2.66 MIL/uL — ABNORMAL LOW (ref 3.87–5.11)
RDW: 14.7 % (ref 11.5–15.5)
WBC: 21.5 10*3/uL — ABNORMAL HIGH (ref 4.0–10.5)

## 2015-07-11 LAB — URINALYSIS, ROUTINE W REFLEX MICROSCOPIC
BILIRUBIN URINE: NEGATIVE
Glucose, UA: NEGATIVE mg/dL
HGB URINE DIPSTICK: NEGATIVE
KETONES UR: NEGATIVE mg/dL
Leukocytes, UA: NEGATIVE
NITRITE: NEGATIVE
PROTEIN: NEGATIVE mg/dL
SPECIFIC GRAVITY, URINE: 1.02 (ref 1.005–1.030)
pH: 5 (ref 5.0–8.0)

## 2015-07-11 LAB — I-STAT CHEM 8, ED
BUN: 88 mg/dL — ABNORMAL HIGH (ref 6–20)
CREATININE: 1.9 mg/dL — AB (ref 0.44–1.00)
Calcium, Ion: 0.96 mmol/L — ABNORMAL LOW (ref 1.13–1.30)
Chloride: 103 mmol/L (ref 101–111)
GLUCOSE: 213 mg/dL — AB (ref 65–99)
HEMATOCRIT: 22 % — AB (ref 36.0–46.0)
HEMOGLOBIN: 7.5 g/dL — AB (ref 12.0–15.0)
POTASSIUM: 4.1 mmol/L (ref 3.5–5.1)
Sodium: 132 mmol/L — ABNORMAL LOW (ref 135–145)
TCO2: 17 mmol/L (ref 0–100)

## 2015-07-11 LAB — CBC
HEMATOCRIT: 24.1 % — AB (ref 36.0–46.0)
HEMOGLOBIN: 7.9 g/dL — AB (ref 12.0–15.0)
MCH: 30.3 pg (ref 26.0–34.0)
MCHC: 32.8 g/dL (ref 30.0–36.0)
MCV: 92.3 fL (ref 78.0–100.0)
Platelets: 164 10*3/uL (ref 150–400)
RBC: 2.61 MIL/uL — AB (ref 3.87–5.11)
RDW: 14.7 % (ref 11.5–15.5)
WBC: 18.9 10*3/uL — ABNORMAL HIGH (ref 4.0–10.5)

## 2015-07-11 LAB — ABO/RH: ABO/RH(D): A POS

## 2015-07-11 LAB — I-STAT TROPONIN, ED
TROPONIN I, POC: 0.09 ng/mL — AB (ref 0.00–0.08)
TROPONIN I, POC: 0.07 ng/mL (ref 0.00–0.08)

## 2015-07-11 LAB — PREPARE RBC (CROSSMATCH)

## 2015-07-11 MED ORDER — ACETAMINOPHEN 650 MG RE SUPP: 650.0000 mg | Freq: Four times a day (QID) | RECTAL | Status: DC | PRN

## 2015-07-11 MED ORDER — ATORVASTATIN CALCIUM 40 MG PO TABS
80.0000 mg | ORAL_TABLET | Freq: Every day | ORAL | Status: DC
  Administered 2015-07-11 – 2015-07-14 (×3): 80 mg via ORAL
  Filled 2015-07-11 (×3): qty 2

## 2015-07-11 MED ORDER — ALBUTEROL SULFATE (2.5 MG/3ML) 0.083% IN NEBU: 3.0000 mL | INHALATION_SOLUTION | Freq: Four times a day (QID) | RESPIRATORY_TRACT | Status: DC | PRN

## 2015-07-11 MED ORDER — PANTOPRAZOLE SODIUM 40 MG IV SOLR
40.0000 mg | Freq: Two times a day (BID) | INTRAVENOUS | Status: DC
  Administered 2015-07-15: 40 mg via INTRAVENOUS
  Filled 2015-07-11: qty 40

## 2015-07-11 MED ORDER — ASPIRIN EC 81 MG PO TBEC
81.0000 mg | DELAYED_RELEASE_TABLET | Freq: Every day | ORAL | Status: DC
  Administered 2015-07-13 – 2015-07-15 (×3): 81 mg via ORAL
  Filled 2015-07-11 (×4): qty 1

## 2015-07-11 MED ORDER — PANTOPRAZOLE SODIUM 40 MG IV SOLR
INTRAVENOUS | Status: AC
  Filled 2015-07-11: qty 160

## 2015-07-11 MED ORDER — ONDANSETRON HCL 4 MG/2ML IJ SOLN: 4.0000 mg | Freq: Four times a day (QID) | INTRAMUSCULAR | Status: DC | PRN

## 2015-07-11 MED ORDER — CLOPIDOGREL BISULFATE 75 MG PO TABS
75.0000 mg | ORAL_TABLET | Freq: Every day | ORAL | Status: DC
  Administered 2015-07-13 – 2015-07-15 (×3): 75 mg via ORAL
  Filled 2015-07-11 (×4): qty 1

## 2015-07-11 MED ORDER — SODIUM CHLORIDE 0.9 % IV BOLUS (SEPSIS)
500.0000 mL | Freq: Once | INTRAVENOUS | Status: AC
  Administered 2015-07-11: 500 mL via INTRAVENOUS

## 2015-07-11 MED ORDER — ONDANSETRON HCL 4 MG PO TABS: 4.0000 mg | ORAL_TABLET | Freq: Four times a day (QID) | ORAL | Status: DC | PRN

## 2015-07-11 MED ORDER — ISOSORBIDE MONONITRATE ER 60 MG PO TB24
120.0000 mg | ORAL_TABLET | Freq: Every day | ORAL | Status: DC
Start: 2015-07-12 — End: 2015-07-15
  Administered 2015-07-13 – 2015-07-15 (×3): 120 mg via ORAL
  Filled 2015-07-11 (×4): qty 2

## 2015-07-11 MED ORDER — ACETAMINOPHEN 325 MG PO TABS
650.0000 mg | ORAL_TABLET | Freq: Four times a day (QID) | ORAL | Status: DC | PRN
  Administered 2015-07-12 – 2015-07-13 (×2): 650 mg via ORAL
  Filled 2015-07-11 (×2): qty 2

## 2015-07-11 MED ORDER — DEXTROSE-NACL 5-0.9 % IV SOLN
INTRAVENOUS | Status: DC
  Administered 2015-07-12 – 2015-07-13 (×5): via INTRAVENOUS
  Administered 2015-07-14: 1000 mL via INTRAVENOUS
  Administered 2015-07-14 (×2): via INTRAVENOUS

## 2015-07-11 MED ORDER — NICOTINE 14 MG/24HR TD PT24
14.0000 mg | MEDICATED_PATCH | Freq: Every day | TRANSDERMAL | Status: DC
  Administered 2015-07-11: 14 mg via TRANSDERMAL
  Filled 2015-07-11 (×2): qty 1

## 2015-07-11 MED ORDER — METOPROLOL TARTRATE 50 MG PO TABS
75.0000 mg | ORAL_TABLET | Freq: Two times a day (BID) | ORAL | Status: DC
  Administered 2015-07-11 – 2015-07-15 (×7): 75 mg via ORAL
  Filled 2015-07-11 (×8): qty 1

## 2015-07-11 MED ORDER — SODIUM CHLORIDE 0.9 % IV SOLN: Freq: Once | INTRAVENOUS | Status: DC

## 2015-07-11 MED ORDER — SODIUM CHLORIDE 0.9% FLUSH
3.0000 mL | Freq: Two times a day (BID) | INTRAVENOUS | Status: DC
  Administered 2015-07-11 – 2015-07-14 (×4): 3 mL via INTRAVENOUS

## 2015-07-11 MED ORDER — SODIUM CHLORIDE 0.9 % IV SOLN
8.0000 mg/h | INTRAVENOUS | Status: DC
  Administered 2015-07-12 – 2015-07-13 (×4): 8 mg/h via INTRAVENOUS
  Filled 2015-07-11 (×8): qty 80

## 2015-07-11 MED ORDER — SODIUM CHLORIDE 0.9 % IV SOLN
80.0000 mg | Freq: Once | INTRAVENOUS | Status: AC
  Administered 2015-07-11: 80 mg via INTRAVENOUS
  Filled 2015-07-11: qty 80

## 2015-07-11 NOTE — H&P (Signed)
History and Physical    Christina Boyle [id number] DOB: Nov 09, 1955 DOA: 07/11/2015  Referring MD/NP/PA: Dr. Daleen Bo PCP: Alonza Bogus, MD  Outpatient Specialists: Cardiology, Dr. Harl Bowie.  Patient coming from: Home  Chief Complaint: Black stool, fatigue, and low BP.   HPI: Christina Boyle is a 60 y.o. female with medical history significant of tobacco use, cervical cancer, HLD, GERD, HTN and ASCVD presented with complaints of dizziness and generalized weakness onset this morning. She also complains of passing out this morning while using the commode. Denies any injuries with the fall, but recalls feeling flushed and diaphoresis. Black and tarry stool for the last few days. Denies taking any supplemental iron, PPIs and NSAIDS. Per records patient had stent placed one week ago after her MI, and has been on DUAT. Patient reports feeling fine after procedure and had no trouble breathing or chest pain. Denies abdominal pain. She reports taking prednisone for recent bronchitis for the last two days. She had hx of antral ulcer and had EGD by Dr [name] about a year ago.   ED Course: BP 92/42; Sodium 132; creatinine 1.90; WBC 21.5; Hgb 8.1; UA and CXR unremarkable. She has been referred for admission. She has BUN of 88.  Case discussed with GI, Dr. Oneida Alar.   Review of Systems: As per HPI otherwise 10 point review of systems negative.    Past Medical History  Diagnosis Date  . ASCVD (arteriosclerotic cardiovascular disease) 02/2006    LM CAD % Ostial RCA-Ostial rPDA disease: CABG x 3 LIMA-LAD, SVG-CxOM, SVG-rPDA b. cath 4/21 - >  ostial SVG-OM and the occluded SVG-RCA with now ostial RCA disease  later PCI to ostial SVG to OM (4/21) --> plan to defer PCI to RCA if recurrent angina  . Tobacco abuse   . Chronic bronchitis   . Coarctation of aorta   . Hyperlipidemia   . Hypertension   . COPD (chronic obstructive pulmonary disease) ([place])   . Shortness of breath dyspnea   .  Depression   . GERD (gastroesophageal reflux disease)   . Cervical cancer ([place])   . Obesity (BMI 30-39.9) 06/30/2015    Past Surgical History  Procedure Laterality Date  . Left breast biopsy      for benign disease  . Cholecystectomy    . Coronary artery bypass graft  02/2006  . Cervical cone biopsy    . Esophagogastroduodenoscopy N/A 07/22/2014    Procedure: ESOPHAGOGASTRODUODENOSCOPY (EGD);  Surgeon: [name], MD;  Location[facility];  Service: Endoscopy;  Laterality: N/A;  . Cardiac catheterization N/A 06/30/2015    Procedure: Left Heart Cath and Cors/Grafts Angiography;  Surgeon: Leonie Man, MD;  Location: [place] CV LAB;  Service: Cardiovascular;  Laterality: N/A;  . Cardiac catheterization N/A 07/03/2015    Procedure: Coronary Stent Intervention;  Surgeon: Astou Lada M Martinique, MD;  Location: [place] [facility];  Service: Cardiovascular;  Laterality: N/A;     reports that she has been smoking Cigarettes.  She started smoking about 46 years ago. She has a 50 pack-year smoking history. She has never used smokeless tobacco. She reports that she does not drink alcohol or use illicit drugs.  No Known Allergies  Family History  Problem Relation Age of Onset  . Heart attack Father 41  . Lung cancer Mother 54    Prior to Admission medications   Medication Sig Start Date End Date Taking? Authorizing Provider  acetaminophen (TYLENOL) 500 MG tablet Take 1,000 mg by mouth  every 6 (six) hours as needed for moderate pain.   Yes Historical Provider, MD  albuterol (PROVENTIL HFA;VENTOLIN HFA) 108 (90 Base) MCG/ACT inhaler Inhale 1-2 puffs into the lungs every 6 (six) hours as needed for wheezing or shortness of breath.   Yes Historical Provider, MD  amLODipine (NORVASC) 5 MG tablet Take 1 tablet (5 mg total) by mouth daily. 07/04/15  Yes Bhavinkumar Bhagat, PA  aspirin EC 81 MG EC tablet Take 1 tablet (81 mg total) by mouth daily. 07/23/14  Yes [name], MD  atorvastatin  (LIPITOR) 80 MG tablet TAKE ONE TABLET BY MOUTH ONCE DAILY 01/26/15  Yes [name], MD  clopidogrel (PLAVIX) 75 MG tablet Take 1 tablet (75 mg total) by mouth daily with breakfast. 07/04/15  Yes Bhavinkumar Bhagat, PA  furosemide (LASIX) 40 MG tablet Take 1 tablet (40 mg total) by mouth 2 (two) times daily. 07/04/15  Yes Bhavinkumar Bhagat, PA  hydrALAZINE (APRESOLINE) 50 MG tablet Take 1 tablet (50 mg total) by mouth every 8 (eight) hours. 07/04/15  Yes Bhavinkumar Bhagat, PA  isosorbide mononitrate (IMDUR) 120 MG 24 hr tablet Take 1 tablet (120 mg total) by mouth daily. 07/05/15  Yes Bhavinkumar Bhagat, PA  lisinopril (PRINIVIL,ZESTRIL) 20 MG tablet Take 1 tablet (20 mg total) by mouth daily. 07/04/15  Yes Bhavinkumar Bhagat, PA  metoprolol 75 MG TABS Take 75 mg by mouth 2 (two) times daily. 07/04/15  Yes Bhavinkumar Bhagat, PA  nitroGLYCERIN (NITROSTAT) 0.4 MG SL tablet Place 1 tablet (0.4 mg total) under the tongue every 5 (five) minutes as needed for chest pain. 01/26/15  Yes [name], MD  predniSONE (DELTASONE) 10 MG tablet Take 6 tablets (60mg ) 4/25 and 4/26 then 5 tablets (50mg ) 4/27 and 4/28 then 4 tables 4/29 and 4/30 then 3 tablets (30mg ) 5/1 and 5/2 then 2 tablet (20mg ) 5/3 and 5/4 then 1 tablet (10mg ) 5/5 and 5/6 by mouth as described Patient taking differently: Take 10-60 mg by mouth See admin instructions. Take 6 tablets (60mg ) 4/25 and 4/26 then 5 tablets (50mg ) 4/27 and 4/28 then 4 tables 4/29 and 4/30 then 3 tablets (30mg ) 5/1 and 5/2 then 2 tablet (20mg ) 5/3 and 5/4 then 1 tablet (10mg ) 5/5 and 5/6 by mouth as described STARTING ON 07/04/15 07/04/15  Yes Bhavinkumar Bhagat, PA  Tiotropium Bromide Monohydrate (SPIRIVA RESPIMAT) 2.5 MCG/ACT AERS Inhale 1 puff into the lungs as needed.    Yes Historical Provider, MD  nicotine (NICODERM CQ) 14 mg/24hr patch PLACE 1 14MG  PATCH DAILY FOR 2 WEEKS 06/12/15   [name], MD  nicotine (NICODERM CQ) 21 mg/24hr patch PLACE 1 21 MG  PATCH DAILY FOR 6 WEEKS 06/12/15   [name], MD  nicotine (NICODERM CQ) 7 mg/24hr patch PLACE 1 7 MG PATCH DAILY FOR 2 WEEKS 06/12/15   [name], MD    Physical Exam: Filed Vitals:   07/11/15 1558 07/11/15 1630 07/11/15 1711 07/11/15 1831  BP: 103/52 103/35 103/65 92/42  Pulse: 73 67 70 73  Temp: 97.9 F (36.6 C)     TempSrc: Oral     Resp: 18 17 16 15   Height: 5\' 2"  (1.575 m)     Weight: 85.73 kg (189 lb)     SpO2: 92% 96% 98% 99%      Constitutional: NAD, calm, comfortable Filed Vitals:   07/11/15 1558 07/11/15 1630 07/11/15 1711 07/11/15 1831  BP: 103/52 103/35 103/65 92/42  Pulse: 73 67 70 73  Temp:  97.9 F (36.6 C)     TempSrc: Oral     Resp: 18 17 16 15   Height: 5\' 2"  (1.575 m)     Weight: 85.73 kg (189 lb)     SpO2: 92% 96% 98% 99%   Respiratory: clear to auscultation bilaterally, no wheezing, no crackles. Normal respiratory effort. No accessory muscle use.  Cardiovascular: Regular rate and rhythm, no murmurs / rubs / gallops. No extremity edema. 2+ pedal pulses. No carotid bruits.  Abdomen: no tenderness, no masses palpated. No hepatosplenomegaly. Bowel sounds positive.  Musculoskeletal: no clubbing / cyanosis. No joint deformity upper and lower extremities. Good ROM, no contractures. Normal muscle tone.  Neurologic: CN 2-12 grossly intact. Sensation intact, DTR normal. Strength 5/5 in all 4.  Psychiatric: Normal judgment and insight. Alert and oriented x 3. Normal mood.   Labs on Admission: I have personally reviewed following labs and imaging studies  CBC:  Recent Labs Lab 07/11/15 1635 07/11/15 1713  WBC 21.5*  --   NEUTROABS 19.4*  --   HGB 8.1* 7.5*  HCT 24.8* 22.0*  MCV 93.2  --   PLT 163  --    Basic Metabolic Panel:  Recent Labs Lab 07/11/15 1713  NA 132*  K 4.1  CL 103  GLUCOSE 213*  BUN 88*  CREATININE 1.90*   Urine analysis:    Component Value Date/Time   COLORURINE YELLOW 07/11/2015 1800   APPEARANCEUR CLEAR  07/11/2015 1800   LABSPEC 1.020 07/11/2015 1800   PHURINE 5.0 07/11/2015 1800   GLUCOSEU NEGATIVE 07/11/2015 1800   HGBUR NEGATIVE 07/11/2015 1800   BILIRUBINUR NEGATIVE 07/11/2015 1800   KETONESUR NEGATIVE 07/11/2015 1800   PROTEINUR NEGATIVE 07/11/2015 1800   UROBILINOGEN 0.2 07/21/2014 0333   NITRITE NEGATIVE 07/11/2015 1800   LEUKOCYTESUR NEGATIVE 07/11/2015 1800    Assessment/Plan  1. Upper GI Bleed:  Will admit her to the ICU, start PPI with bolus and drip.  She will be given 2 units of PRBCs, and likely will need EGD tomorrow.   I spoke with Dr Oneida Alar tonight, and will start NPO after midnight.  Suspect a combination of DUAT and steroid. Given her very recent stent placement, will continue with ASA and Plavix.  Will check H and H every 4 hours. Hold diuretics and BP except for her betablocker.  2. AKI, will hold lisinopril. Continue IVF and monitor. 3. Dehydration, continue IVF. 4. Hypotension, secondary to GI bleed with volume depletion, and lisinopril. Continue IVF. Lisinopril held.  5. Anemia, secondary to GI bleed. Symptomatic, plan to transfuse 2U PRBC. 6. Acute upper GI bleed. Due to recent stent placement continue Plavix and monitor. Clear liquid diet . Plan for GI consult and scope in the am.  7. COPD, stable.  8. Hx of CABG 2007. Stable 9. Heart catherization and stent placement 4/21. Continue Plavix.     DVT prophylaxis: SCDs Code Status: Full Family Communication: Carlean Purl bedside.  Disposition Plan: Anticipate discharge in 1-2 days.  Consults called: Dr. Oneida Alar, GI Admission status: Admit to sdu   Orvan Falconer MD FACP. [facility] Pager [phone] If 7PM-7AM, please contact night-coverage [email or web address] Password [email or web address]  07/11/2015, 7:32 PM    By signing my name below, I, Rennis Harding, attest that this documentation has been prepared under the direction and in the presence of Orvan Falconer, MD. Electronically signed: Rennis Harding, Scribe.  07/11/2015 7:45pm   I, Dr. Orvan Falconer, personally performed the services described in this documentation. All medical record entries made by  the scribe were at my discretion and in my presence. Orvan Falconer, MD 07/11/2015

## 2015-07-11 NOTE — ED Notes (Signed)
Report given to Robbie RN in ICU 

## 2015-07-11 NOTE — ED Provider Notes (Signed)
CSN: [id number]     Arrival date & time 07/11/15  1553 History   First MD Initiated Contact with Patient 07/11/15 1559     Chief Complaint  Patient presents with  . Hypotension     (Consider location/radiation/quality/duration/timing/severity/associated sxs/prior Treatment) HPI   Christina Boyle is a 60 y.o. female who presents for evaluation, dizziness, syncope, and weak feeling, all onset this morning. She had been doing well, following hospital discharge one week ago after cardiac stenting. Up until today, she's been eating well, taking her usual medications, and not having any chest pain or trouble breathing. This morning which got out of bed she felt flushed, was sweating, and then passed out in the bathroom. No one was with her. Her daughter reports that she had been sitting on the commode, and that she had passed stool. There was black and tarry in consistency. She did not hurt yourself in the fall. It is unknown how long she was unconscious for. Since then she has been a bleed some, but has continued to feel very weak. A home health nurse came to visit her, for routine visit, and felt she should come to the hospital, to get checked out. She's not had constipation, diarrhea, dysuria, urinary frequency, fever, chills, cough or chest pain. There are no other known modifying factors.  Past Medical History  Diagnosis Date  . ASCVD (arteriosclerotic cardiovascular disease) 02/2006    LM CAD % Ostial RCA-Ostial rPDA disease: CABG x 3 LIMA-LAD, SVG-CxOM, SVG-rPDA b. cath 4/21 - >  ostial SVG-OM and the occluded SVG-RCA with now ostial RCA disease  later PCI to ostial SVG to OM (4/21) --> plan to defer PCI to RCA if recurrent angina  . Tobacco abuse   . Chronic bronchitis   . Coarctation of aorta   . Hyperlipidemia   . Hypertension   . COPD (chronic obstructive pulmonary disease) ([place])   . Shortness of breath dyspnea   . Depression   . GERD (gastroesophageal reflux disease)   . Cervical  cancer ([place])   . Obesity (BMI 30-39.9) 06/30/2015   Past Surgical History  Procedure Laterality Date  . Left breast biopsy      for benign disease  . Cholecystectomy    . Coronary artery bypass graft  02/2006  . Cervical cone biopsy    . Esophagogastroduodenoscopy N/A 07/22/2014    Procedure: ESOPHAGOGASTRODUODENOSCOPY (EGD);  Surgeon: [name], MD;  Location: [facility] ENDOSCOPY;  Service: Endoscopy;  Laterality: N/A;  . Cardiac catheterization N/A 06/30/2015    Procedure: Left Heart Cath and Cors/Grafts Angiography;  Surgeon: Leonie Man, MD;  Location: [place] [facility] LAB;  Service: Cardiovascular;  Laterality: N/A;  . Cardiac catheterization N/A 07/03/2015    Procedure: Coronary Stent Intervention;  Surgeon: Peter M Martinique, MD;  Location: [place] [facility] LAB;  Service: Cardiovascular;  Laterality: N/A;   Family History  Problem Relation Age of Onset  . Heart attack Father 58  . Lung cancer Mother 59   Social History  Substance Use Topics  . Smoking status: Current Every Day Smoker -- 1.00 packs/day for 50 years    Types: Cigarettes    Start date: 05/21/1969  . Smokeless tobacco: Never Used  . Alcohol Use: No   OB History    No data available     Review of Systems  All other systems reviewed and are negative.     Allergies  Review of patient's allergies indicates no known allergies.  Home Medications  Prior to Admission medications   Medication Sig Start Date End Date Taking? Authorizing Provider  acetaminophen (TYLENOL) 500 MG tablet Take 1,000 mg by mouth every 6 (six) hours as needed for moderate pain.   Yes Historical Provider, MD  albuterol (PROVENTIL HFA;VENTOLIN HFA) 108 (90 Base) MCG/ACT inhaler Inhale 1-2 puffs into the lungs every 6 (six) hours as needed for wheezing or shortness of breath.   Yes Historical Provider, MD  amLODipine (NORVASC) 5 MG tablet Take 1 tablet (5 mg total) by mouth daily. 07/04/15  Yes Bhavinkumar Bhagat, PA  aspirin EC 81 MG EC  tablet Take 1 tablet (81 mg total) by mouth daily. 07/23/14  Yes [name], MD  atorvastatin (LIPITOR) 80 MG tablet TAKE ONE TABLET BY MOUTH ONCE DAILY 01/26/15  Yes [name], MD  clopidogrel (PLAVIX) 75 MG tablet Take 1 tablet (75 mg total) by mouth daily with breakfast. 07/04/15  Yes Bhavinkumar Bhagat, PA  furosemide (LASIX) 40 MG tablet Take 1 tablet (40 mg total) by mouth 2 (two) times daily. 07/04/15  Yes Bhavinkumar Bhagat, PA  hydrALAZINE (APRESOLINE) 50 MG tablet Take 1 tablet (50 mg total) by mouth every 8 (eight) hours. 07/04/15  Yes Bhavinkumar Bhagat, PA  isosorbide mononitrate (IMDUR) 120 MG 24 hr tablet Take 1 tablet (120 mg total) by mouth daily. 07/05/15  Yes Bhavinkumar Bhagat, PA  lisinopril (PRINIVIL,ZESTRIL) 20 MG tablet Take 1 tablet (20 mg total) by mouth daily. 07/04/15  Yes Bhavinkumar Bhagat, PA  metoprolol 75 MG TABS Take 75 mg by mouth 2 (two) times daily. 07/04/15  Yes Bhavinkumar Bhagat, PA  nitroGLYCERIN (NITROSTAT) 0.4 MG SL tablet Place 1 tablet (0.4 mg total) under the tongue every 5 (five) minutes as needed for chest pain. 01/26/15  Yes [name], MD  predniSONE (DELTASONE) 10 MG tablet Take 6 tablets (60mg ) 4/25 and 4/26 then 5 tablets (50mg ) 4/27 and 4/28 then 4 tables 4/29 and 4/30 then 3 tablets (30mg ) 5/1 and 5/2 then 2 tablet (20mg ) 5/3 and 5/4 then 1 tablet (10mg ) 5/5 and 5/6 by mouth as described Patient taking differently: Take 10-60 mg by mouth See admin instructions. Take 6 tablets (60mg ) 4/25 and 4/26 then 5 tablets (50mg ) 4/27 and 4/28 then 4 tables 4/29 and 4/30 then 3 tablets (30mg ) 5/1 and 5/2 then 2 tablet (20mg ) 5/3 and 5/4 then 1 tablet (10mg ) 5/5 and 5/6 by mouth as described STARTING ON 07/04/15 07/04/15  Yes Bhavinkumar Bhagat, PA  Tiotropium Bromide Monohydrate (SPIRIVA RESPIMAT) 2.5 MCG/ACT AERS Inhale 1 puff into the lungs as needed.    Yes Historical Provider, MD  nicotine (NICODERM CQ) 14 mg/24hr patch PLACE 1 14MG  PATCH DAILY  FOR 2 WEEKS 06/12/15   [name], MD  nicotine (NICODERM CQ) 21 mg/24hr patch PLACE 1 21 MG PATCH DAILY FOR 6 WEEKS 06/12/15   [name], MD  nicotine (NICODERM CQ) 7 mg/24hr patch PLACE 1 7 MG PATCH DAILY FOR 2 WEEKS 06/12/15   [name], MD   BP 92/42 mmHg  Pulse 73  Temp(Src) 97.9 F (36.6 C) (Oral)  Resp 15  Ht 5\' 2"  (1.575 m)  Wt 189 lb (85.73 kg)  BMI 34.56 kg/m2  SpO2 99% Physical Exam  ED Course  Procedures (including critical care time)  Initial clinical impression- hypotension associated with recent initiation of treatment plan for hypertension, and cardiac disease. I suspect that she has already depletion as the etiology. We'll evaluate for cardiac insufficiency, metabolic instability, and  occult infectious processes. SIRS negative.  Medications  0.9 %  sodium chloride infusion (not administered)  sodium chloride 0.9 % bolus 500 mL (0 mLs Intravenous Stopped 07/11/15 1749)    Patient Vitals for the past 24 hrs:  BP Temp Temp src Pulse Resp SpO2 Height Weight  07/11/15 1831 (!) 92/42 mmHg - - 73 15 99 % - -  07/11/15 1711 103/65 mmHg - - 70 16 98 % - -  07/11/15 1630 (!) 103/35 mmHg - - 67 17 96 % - -  07/11/15 1558 (!) 103/52 mmHg 97.9 F (36.6 C) Oral 73 18 92 % 5\' 2"  (1.575 m) 189 lb (85.73 kg)    7:02 PM Reevaluation with update and discussion. After initial assessment and treatment, an updated evaluation reveals blood pressure trending down, despite IV fluids.. Patient feels weak at this time. She is updated on the findings and the need for intervention with blood transfusion, additional IV fluids, and hospitalization for decompensated state.Daleen Bo L   19:30- stool, black in color, guaiac positive.  7:41 PM-Consult complete with Dr. Marin Comment. Patient case explained and discussed. He agrees to admit patient for further evaluation and treatment. Call ended at 19:49  CRITICAL CARE Performed by: Richarda Blade Total critical care time: 45  minutes Critical care time was exclusive of separately billable procedures and treating other patients. Critical care was necessary to treat or prevent imminent or life-threatening deterioration. Critical care was time spent personally by me on the following activities: development of treatment plan with patient and/or surrogate as well as nursing, discussions with consultants, evaluation of patient's response to treatment, examination of patient, obtaining history from patient or surrogate, ordering and performing treatments and interventions, ordering and review of laboratory studies, ordering and review of radiographic studies, pulse oximetry and re-evaluation of patient's condition.   Labs Review Labs Reviewed  CBC WITH DIFFERENTIAL/PLATELET - Abnormal; Notable for the following:    WBC 21.5 (*)    RBC 2.66 (*)    Hemoglobin 8.1 (*)    HCT 24.8 (*)    Neutro Abs 19.4 (*)    All other components within normal limits  I-STAT CHEM 8, ED - Abnormal; Notable for the following:    Sodium 132 (*)    BUN 88 (*)    Creatinine, Ser 1.90 (*)    Glucose, Bld 213 (*)    Calcium, Ion 0.96 (*)    Hemoglobin 7.5 (*)    HCT 22.0 (*)    All other components within normal limits  I-STAT TROPOININ, ED - Abnormal; Notable for the following:    Troponin i, poc 0.09 (*)    All other components within normal limits  URINALYSIS, ROUTINE W REFLEX MICROSCOPIC (NOT AT [facility])  I-STAT TROPOININ, ED  POC OCCULT BLOOD, ED  TYPE AND SCREEN  PREPARE RBC (CROSSMATCH)    Imaging Review Dg Chest Port 1 View  07/11/2015  CLINICAL DATA:  Unresponsive today. EXAM: PORTABLE CHEST 1 VIEW COMPARISON:  July 01, 2015 FINDINGS: The heart size and mediastinal contours are stable. The heart size is mildly enlarged. Both lungs are clear. The visualized skeletal structures are unremarkable. IMPRESSION: No active cardiopulmonary disease. Electronically Signed   By: Abelardo Diesel M.D.   On: 07/11/2015 19:33   I have personally  reviewed and evaluated these images and lab results as part of my medical decision-making.   EKG Interpretation   Date/Time:  Tuesday Jul 11 2015 16:29:17 EDT Ventricular Rate:  73 PR Interval:  175 QRS  Duration: 85 QT Interval:  377 QTC Calculation: 415 R Axis:   34 Text Interpretation:  Sinus rhythm Probable left atrial enlargement  Abnormal R-wave progression, early transition LVH with secondary  repolarization abnormality Since last tracing of earlier today No  significant change was found Confirmed by Eulis Foster  MD, Kodee Drury [phone]) on  07/11/2015 4:44:44 PM      MDM   Final diagnoses:  Rectal bleeding  Hypotension, unspecified hypotension type  Anemia, unspecified anemia type  AKI (acute kidney injury) ([place])    Rectal bleeding causing weakness, malaise, and hypotension. Associated a GI related to diuresis as well as recent initiation of ACE inhibitor. Patient is symptomatic, with new anemia from blood loss, therefore will require transfusion for stabilization. Recently had a cardiac catheterization, which appears to be stable. EKG is unchanged, and troponin is decreasing from baseline. Glucose elevated is likely secondary to current treatment for bronchitis, with prednisone. She will require admission for treatment, stabilization, and close observation.  Nursing Notes Reviewed/ Care Coordinated, and agree without changes. Applicable Imaging Reviewed.  Interpretation of Laboratory Data incorporated into ED treatment  Plan: Admit   Daleen Bo, MD 07/11/15 [phone]

## 2015-07-11 NOTE — ED Notes (Addendum)
Per EMS, pt from home initially called out for HA. Before EMS arrived, daughter helped pt to bathroom. Pt became unresponsive while sitting on toilet. Upon arrival pt was cold, clammy, unresponsive with thready pulses. Pt took about 5 min to arouse. EMS states that in toilet was thick, tarry stool. Pt denies any pain at this time. Pt had cardiac stents placed last week. Pt also found to be hypotensive with BP 90/47.

## 2015-07-11 NOTE — ED Notes (Signed)
Nurse attempting IV with [place] machine

## 2015-07-11 NOTE — ED Notes (Signed)
Nurse able to obtain IV with [place] but unable to draw blood. EDP aware

## 2015-07-11 NOTE — ED Notes (Signed)
Family states pt is on about four new blood pressure medications

## 2015-07-11 NOTE — ED Notes (Signed)
Phlebotomy at bedside.

## 2015-07-11 NOTE — ED Notes (Signed)
Nurse able to draw enough lab for a CBC. Lab attempted twice to draw blood but unsuccessful

## 2015-07-12 ENCOUNTER — Encounter (HOSPITAL_COMMUNITY): Payer: Self-pay | Admitting: Cardiology

## 2015-07-12 ENCOUNTER — Encounter (HOSPITAL_COMMUNITY)
Admission: EM | Disposition: A | Discharge status: Home Health | Payer: Self-pay | Source: Home / Self Care | Attending: Pulmonary Disease

## 2015-07-12 DIAGNOSIS — K259 Gastric ulcer, unspecified as acute or chronic, without hemorrhage or perforation: Secondary | ICD-10-CM

## 2015-07-12 DIAGNOSIS — K625 Hemorrhage of anus and rectum: Secondary | ICD-10-CM

## 2015-07-12 DIAGNOSIS — D696 Thrombocytopenia, unspecified: Secondary | ICD-10-CM

## 2015-07-12 DIAGNOSIS — D649 Anemia, unspecified: Secondary | ICD-10-CM

## 2015-07-12 DIAGNOSIS — E861 Hypovolemia: Secondary | ICD-10-CM | POA: Diagnosis present

## 2015-07-12 DIAGNOSIS — K921 Melena: Secondary | ICD-10-CM

## 2015-07-12 DIAGNOSIS — N179 Acute kidney failure, unspecified: Secondary | ICD-10-CM

## 2015-07-12 DIAGNOSIS — R7989 Other specified abnormal findings of blood chemistry: Secondary | ICD-10-CM

## 2015-07-12 DIAGNOSIS — I25119 Atherosclerotic heart disease of native coronary artery with unspecified angina pectoris: Secondary | ICD-10-CM

## 2015-07-12 DIAGNOSIS — D62 Acute posthemorrhagic anemia: Secondary | ICD-10-CM

## 2015-07-12 DIAGNOSIS — K269 Duodenal ulcer, unspecified as acute or chronic, without hemorrhage or perforation: Secondary | ICD-10-CM

## 2015-07-12 DIAGNOSIS — K228 Other specified diseases of esophagus: Secondary | ICD-10-CM

## 2015-07-12 HISTORY — PX: ESOPHAGOGASTRODUODENOSCOPY: SHX5428

## 2015-07-12 LAB — CBC
HCT: 30.6 % — ABNORMAL LOW (ref 36.0–46.0)
HEMATOCRIT: 29.7 % — AB (ref 36.0–46.0)
HEMATOCRIT: 30 % — AB (ref 36.0–46.0)
HEMOGLOBIN: 10.6 g/dL — AB (ref 12.0–15.0)
Hemoglobin: 10.7 g/dL — ABNORMAL LOW (ref 12.0–15.0)
Hemoglobin: 10.2 g/dL — ABNORMAL LOW (ref 12.0–15.0)
MCH: 30.4 pg (ref 26.0–34.0)
MCH: 29.9 pg (ref 26.0–34.0)
MCH: 30.6 pg (ref 26.0–34.0)
MCHC: 35 g/dL (ref 30.0–36.0)
MCHC: 34.3 g/dL (ref 30.0–36.0)
MCHC: 35.3 g/dL (ref 30.0–36.0)
MCV: 86.9 fL (ref 78.0–100.0)
MCV: 87.1 fL (ref 78.0–100.0)
MCV: 86.7 fL (ref 78.0–100.0)
PLATELETS: 71 10*3/uL — AB (ref 150–400)
Platelets: 121 10*3/uL — ABNORMAL LOW (ref 150–400)
Platelets: 122 10*3/uL — ABNORMAL LOW (ref 150–400)
RBC: 3.52 MIL/uL — AB (ref 3.87–5.11)
RBC: 3.41 MIL/uL — ABNORMAL LOW (ref 3.87–5.11)
RBC: 3.46 MIL/uL — ABNORMAL LOW (ref 3.87–5.11)
RDW: 16 % — ABNORMAL HIGH (ref 11.5–15.5)
RDW: 16.5 % — AB (ref 11.5–15.5)
RDW: 16.7 % — ABNORMAL HIGH (ref 11.5–15.5)
WBC: 14 10*3/uL — ABNORMAL HIGH (ref 4.0–10.5)
WBC: 16.1 10*3/uL — ABNORMAL HIGH (ref 4.0–10.5)
WBC: 16 10*3/uL — AB (ref 4.0–10.5)

## 2015-07-12 LAB — COMPREHENSIVE METABOLIC PANEL
ALT: 22 U/L (ref 14–54)
ANION GAP: 6 (ref 5–15)
AST: 16 U/L (ref 15–41)
Albumin: 2.8 g/dL — ABNORMAL LOW (ref 3.5–5.0)
Alkaline Phosphatase: 36 U/L — ABNORMAL LOW (ref 38–126)
BILIRUBIN TOTAL: 2.1 mg/dL — AB (ref 0.3–1.2)
BUN: 74 mg/dL — ABNORMAL HIGH (ref 6–20)
CO2: 21 mmol/L — ABNORMAL LOW (ref 22–32)
Calcium: 8.3 mg/dL — ABNORMAL LOW (ref 8.9–10.3)
Chloride: 111 mmol/L (ref 101–111)
Creatinine, Ser: 1.32 mg/dL — ABNORMAL HIGH (ref 0.44–1.00)
GFR, EST AFRICAN AMERICAN: 49 mL/min — AB (ref >60)
GFR, EST NON AFRICAN AMERICAN: 42 mL/min — AB (ref >60)
Glucose, Bld: 137 mg/dL — ABNORMAL HIGH (ref 65–99)
POTASSIUM: 4.1 mmol/L (ref 3.5–5.1)
Sodium: 138 mmol/L (ref 135–145)
TOTAL PROTEIN: 4.9 g/dL — AB (ref 6.5–8.1)

## 2015-07-12 LAB — KOH PREP: KOH PREP: NONE SEEN

## 2015-07-12 LAB — TROPONIN I
TROPONIN I: 0.19 ng/mL — AB (ref <0.031)
Troponin I: 0.46 ng/mL — ABNORMAL HIGH (ref <0.031)
Troponin I: 0.37 ng/mL — ABNORMAL HIGH (ref <0.031)

## 2015-07-12 LAB — OCCULT BLOOD, POC DEVICE: Fecal Occult Bld: POSITIVE — AB

## 2015-07-12 SURGERY — EGD (ESOPHAGOGASTRODUODENOSCOPY)
Anesthesia: Moderate Sedation

## 2015-07-12 MED ORDER — NYSTATIN 100000 UNIT/ML MT SUSP
5.0000 mL | Freq: Four times a day (QID) | OROMUCOSAL | Status: DC
  Administered 2015-07-12 – 2015-07-15 (×10): 500000 [IU] via ORAL
  Filled 2015-07-12 (×10): qty 5

## 2015-07-12 MED ORDER — MIDAZOLAM HCL 5 MG/5ML IJ SOLN
INTRAMUSCULAR | Status: DC | PRN
  Administered 2015-07-12: 1 mg via INTRAVENOUS
  Administered 2015-07-12: 2 mg via INTRAVENOUS
  Administered 2015-07-12 (×2): 1 mg via INTRAVENOUS

## 2015-07-12 MED ORDER — SUCRALFATE 1 GM/10ML PO SUSP
1.0000 g | Freq: Three times a day (TID) | ORAL | Status: DC
  Administered 2015-07-12 – 2015-07-15 (×11): 1 g via ORAL
  Filled 2015-07-12 (×11): qty 10

## 2015-07-12 MED ORDER — SODIUM CHLORIDE 0.9 % IV SOLN: INTRAVENOUS | Status: DC

## 2015-07-12 MED ORDER — MEPERIDINE HCL 50 MG/ML IJ SOLN
INTRAMUSCULAR | Status: DC | PRN
  Administered 2015-07-12 (×2): 25 mg via INTRAVENOUS

## 2015-07-12 MED ORDER — BUTAMBEN-TETRACAINE-BENZOCAINE 2-2-14 % EX AERO
INHALATION_SPRAY | CUTANEOUS | Status: DC | PRN
  Administered 2015-07-12: 2 via TOPICAL

## 2015-07-12 MED ORDER — MEPERIDINE HCL 50 MG/ML IJ SOLN
INTRAMUSCULAR | Status: AC
  Filled 2015-07-12: qty 1

## 2015-07-12 MED ORDER — STERILE WATER FOR IRRIGATION IR SOLN
Status: DC | PRN
  Administered 2015-07-12: 17:00:00

## 2015-07-12 MED ORDER — SODIUM CHLORIDE 0.9% FLUSH
INTRAVENOUS | Status: AC
  Filled 2015-07-12: qty 10

## 2015-07-12 MED ORDER — MIDAZOLAM HCL 5 MG/5ML IJ SOLN
INTRAMUSCULAR | Status: AC
  Filled 2015-07-12: qty 10

## 2015-07-12 NOTE — Progress Notes (Addendum)
Subjective: She was admitted yesterday with acute GI bleed. Her situation is very complicated and she had recent non-STEMI and stent placement. She is on anticoagulation because of her stent placement. She says she has a little bit of abdominal pain. She's not having any chest pain. Her hemoglobin level has been replaced and is over 10 again. She has no other new complaints. She does have COPD and had recent exacerbation in addition to all of the other problems.  Objective: Vital signs in last 24 hours: Temp:  [97.9 F (36.6 C)-98.9 F (37.2 C)] 98.5 F (36.9 C) (05/03 0729) Pulse Rate:  [60-79] 71 (05/03 0600) Resp:  [7-20] 20 (05/03 0600) BP: (89-134)/(35-73) 101/50 mmHg (05/03 0600) SpO2:  [92 %-100 %] 96 % (05/03 0600) Weight:  [85.73 kg (189 lb)-85.9 kg (189 lb 6 oz)] 85.9 kg (189 lb 6 oz) (05/03 0500) Weight change:  Last BM Date: 07/11/15  Intake/Output from previous day: 05/02 0701 - 05/03 0700 In: 1421.5 [I.V.:572.5; Blood:749; IV Piggyback:100] Out: 5643 [Urine:1650]  PHYSICAL EXAM General appearance: alert, cooperative and mild distress Resp: rhonchi bilaterally Cardio: regular rate and rhythm, S1, S2 normal, no murmur, click, rub or gallop GI: Mildly tender in the epigastric region Extremities: extremities normal, atraumatic, no cyanosis or edema  Lab Results:  Results for orders placed or performed during the hospital encounter of 07/11/15 (from the past 48 hour(s))  CBC with Differential     Status: Abnormal   Collection Time: 07/11/15  4:35 PM  Result Value Ref Range   WBC 21.5 (H) 4.0 - 10.5 K/uL   RBC 2.66 (L) 3.87 - 5.11 MIL/uL   Hemoglobin 8.1 (L) 12.0 - 15.0 g/dL   HCT 24.8 (L) 36.0 - 46.0 %   MCV 93.2 78.0 - 100.0 fL   MCH 30.5 26.0 - 34.0 pg   MCHC 32.7 30.0 - 36.0 g/dL   RDW 14.7 11.5 - 15.5 %   Platelets 163 150 - 400 K/uL   Neutrophils Relative % 90 %   Neutro Abs 19.4 (H) 1.7 - 7.7 K/uL   Lymphocytes Relative 6 %   Lymphs Abs 1.2 0.7 - 4.0 K/uL    Monocytes Relative 4 %   Monocytes Absolute 0.9 0.1 - 1.0 K/uL   Eosinophils Relative 0 %   Eosinophils Absolute 0.0 0.0 - 0.7 K/uL   Basophils Relative 0 %   Basophils Absolute 0.0 0.0 - 0.1 K/uL  I-stat troponin, ED     Status: Abnormal   Collection Time: 07/11/15  5:11 PM  Result Value Ref Range   Troponin i, poc 0.09 (HH) 0.00 - 0.08 ng/mL   Comment 3            Comment: Due to the release kinetics of cTnI, a negative result within the first hours of the onset of symptoms does not rule out myocardial infarction with certainty. If myocardial infarction is still suspected, repeat the test at appropriate intervals.   I-stat Chem 8, ED     Status: Abnormal   Collection Time: 07/11/15  5:13 PM  Result Value Ref Range   Sodium 132 (L) 135 - 145 mmol/L   Potassium 4.1 3.5 - 5.1 mmol/L   Chloride 103 101 - 111 mmol/L   BUN 88 (H) 6 - 20 mg/dL   Creatinine, Ser 1.90 (H) 0.44 - 1.00 mg/dL   Glucose, Bld 213 (H) 65 - 99 mg/dL   Calcium, Ion 0.96 (L) 1.13 - 1.30 mmol/L   TCO2 17 0 -  100 mmol/L   Hemoglobin 7.5 (L) 12.0 - 15.0 g/dL   HCT 22.0 (L) 36.0 - 46.0 %  I-stat troponin, ED     Status: None   Collection Time: 07/11/15  5:40 PM  Result Value Ref Range   Troponin i, poc 0.07 0.00 - 0.08 ng/mL   Comment 3            Comment: Due to the release kinetics of cTnI, a negative result within the first hours of the onset of symptoms does not rule out myocardial infarction with certainty. If myocardial infarction is still suspected, repeat the test at appropriate intervals.   Urinalysis, Routine w reflex microscopic     Status: None   Collection Time: 07/11/15  6:00 PM  Result Value Ref Range   Color, Urine YELLOW YELLOW   APPearance CLEAR CLEAR   Specific Gravity, Urine 1.020 1.005 - 1.030   pH 5.0 5.0 - 8.0   Glucose, UA NEGATIVE NEGATIVE mg/dL   Hgb urine dipstick NEGATIVE NEGATIVE   Bilirubin Urine NEGATIVE NEGATIVE   Ketones, ur NEGATIVE NEGATIVE mg/dL   Protein, ur  NEGATIVE NEGATIVE mg/dL   Nitrite NEGATIVE NEGATIVE   Leukocytes, UA NEGATIVE NEGATIVE    Comment: MICROSCOPIC NOT DONE ON URINES WITH NEGATIVE PROTEIN, BLOOD, LEUKOCYTES, NITRITE, OR GLUCOSE <1000 mg/dL.  Occult blood, poc device     Status: Abnormal   Collection Time: 07/11/15  7:23 PM  Result Value Ref Range   Fecal Occult Bld POSITIVE (A) NEGATIVE  Type and screen [facility]     Status: None (Preliminary result)   Collection Time: 07/11/15  7:45 PM  Result Value Ref Range   ABO/RH(D) A POS    Antibody Screen NEG    Sample Expiration 07/14/2015    Unit Number [id number]    Blood Component Type RED CELLS,LR    Unit division 00    Status of Unit ISSUED    Transfusion Status OK TO TRANSFUSE    Crossmatch Result Compatible    Unit Number [id number]    Blood Component Type RED CELLS,LR    Unit division 00    Status of Unit ISSUED    Transfusion Status OK TO TRANSFUSE    Crossmatch Result Compatible   Prepare RBC     Status: None   Collection Time: 07/11/15  7:45 PM  Result Value Ref Range   Order Confirmation ORDER PROCESSED BY BLOOD BANK   ABO/Rh     Status: None   Collection Time: 07/11/15  7:45 PM  Result Value Ref Range   ABO/RH(D) A POS   CBC     Status: Abnormal   Collection Time: 07/11/15  7:45 PM  Result Value Ref Range   WBC 18.9 (H) 4.0 - 10.5 K/uL   RBC 2.61 (L) 3.87 - 5.11 MIL/uL   Hemoglobin 7.9 (L) 12.0 - 15.0 g/dL   HCT 24.1 (L) 36.0 - 46.0 %   MCV 92.3 78.0 - 100.0 fL   MCH 30.3 26.0 - 34.0 pg   MCHC 32.8 30.0 - 36.0 g/dL   RDW 14.7 11.5 - 15.5 %   Platelets 164 150 - 400 K/uL  CBC     Status: Abnormal   Collection Time: 07/12/15  5:58 AM  Result Value Ref Range   WBC 14.0 (H) 4.0 - 10.5 K/uL   RBC 3.52 (L) 3.87 - 5.11 MIL/uL   Hemoglobin 10.7 (L) 12.0 - 15.0 g/dL    Comment: DELTA CHECK NOTED RESULT REPEATED AND  VERIFIED    HCT 30.6 (L) 36.0 - 46.0 %   MCV 86.9 78.0 - 100.0 fL   MCH 30.4 26.0 - 34.0 pg   MCHC 35.0 30.0 - 36.0  g/dL   RDW 16.0 (H) 11.5 - 15.5 %   Platelets 71 (L) 150 - 400 K/uL    Comment: SPECIMEN CHECKED FOR CLOTS PLATELET COUNT CONFIRMED BY SMEAR   Comprehensive metabolic panel     Status: Abnormal   Collection Time: 07/12/15  5:58 AM  Result Value Ref Range   Sodium 138 135 - 145 mmol/L   Potassium 4.1 3.5 - 5.1 mmol/L   Chloride 111 101 - 111 mmol/L   CO2 21 (L) 22 - 32 mmol/L   Glucose, Bld 137 (H) 65 - 99 mg/dL   BUN 74 (H) 6 - 20 mg/dL   Creatinine, Ser 1.32 (H) 0.44 - 1.00 mg/dL   Calcium 8.3 (L) 8.9 - 10.3 mg/dL   Total Protein 4.9 (L) 6.5 - 8.1 g/dL   Albumin 2.8 (L) 3.5 - 5.0 g/dL   AST 16 15 - 41 U/L   ALT 22 14 - 54 U/L   Alkaline Phosphatase 36 (L) 38 - 126 U/L   Total Bilirubin 2.1 (H) 0.3 - 1.2 mg/dL   GFR calc non Af Amer 42 (L) >60 mL/min   GFR calc Af Amer 49 (L) >60 mL/min    Comment: (NOTE) The eGFR has been calculated using the CKD EPI equation. This calculation has not been validated in all clinical situations. eGFR's persistently <60 mL/min signify possible Chronic Kidney Disease.    Anion gap 6 5 - 15    ABGS  Recent Labs  07/11/15 1713  TCO2 17   CULTURES No results found for this or any previous visit (from the past 240 hour(s)). Studies/Results: Dg Chest Port 1 View  07/11/2015  CLINICAL DATA:  Unresponsive today. EXAM: PORTABLE CHEST 1 VIEW COMPARISON:  July 01, 2015 FINDINGS: The heart size and mediastinal contours are stable. The heart size is mildly enlarged. Both lungs are clear. The visualized skeletal structures are unremarkable. IMPRESSION: No active cardiopulmonary disease. Electronically Signed   By: Abelardo Diesel M.D.   On: 07/11/2015 19:33    Medications:  Prior to Admission:  Prescriptions prior to admission  Medication Sig Dispense Refill Last Dose  . acetaminophen (TYLENOL) 500 MG tablet Take 1,000 mg by mouth every 6 (six) hours as needed for moderate pain.   UNKWOWN  . albuterol (PROVENTIL HFA;VENTOLIN HFA) 108 (90 Base)  MCG/ACT inhaler Inhale 1-2 puffs into the lungs every 6 (six) hours as needed for wheezing or shortness of breath.   07/11/2015 at Unknown time  . amLODipine (NORVASC) 5 MG tablet Take 1 tablet (5 mg total) by mouth daily. 30 tablet 6 07/11/2015 at Unknown time  . aspirin EC 81 MG EC tablet Take 1 tablet (81 mg total) by mouth daily.   07/11/2015 at Unknown time  . atorvastatin (LIPITOR) 80 MG tablet TAKE ONE TABLET BY MOUTH ONCE DAILY 90 tablet 3 07/10/2015 at Unknown time  . clopidogrel (PLAVIX) 75 MG tablet Take 1 tablet (75 mg total) by mouth daily with breakfast. 30 tablet 11 07/11/2015 at Unknown time  . furosemide (LASIX) 40 MG tablet Take 1 tablet (40 mg total) by mouth 2 (two) times daily. 60 tablet 3 07/11/2015 at Unknown time  . hydrALAZINE (APRESOLINE) 50 MG tablet Take 1 tablet (50 mg total) by mouth every 8 (eight) hours. 90 tablet 6 07/11/2015  at Unknown time  . isosorbide mononitrate (IMDUR) 120 MG 24 hr tablet Take 1 tablet (120 mg total) by mouth daily. 30 tablet 6 07/11/2015 at Unknown time  . lisinopril (PRINIVIL,ZESTRIL) 20 MG tablet Take 1 tablet (20 mg total) by mouth daily. 30 tablet 6 07/11/2015 at Unknown time  . metoprolol 75 MG TABS Take 75 mg by mouth 2 (two) times daily. 60 tablet 6 07/11/2015 at Unknown time  . nitroGLYCERIN (NITROSTAT) 0.4 MG SL tablet Place 1 tablet (0.4 mg total) under the tongue every 5 (five) minutes as needed for chest pain. 25 tablet 3 UNKNOWN  . predniSONE (DELTASONE) 10 MG tablet Take 6 tablets (85m) 4/25 and 4/26 then 5 tablets (532m 4/27 and 4/28 then 4 tables 4/29 and 4/30 then 3 tablets (3022m5/1 and 5/2 then 2 tablet (17m44m/3 and 5/4 then 1 tablet (10mg51m5 and 5/6 by mouth as described (Patient taking differently: Take 10-60 mg by mouth See admin instructions. Take 6 tablets (60mg)6m5 and 4/26 then 5 tablets (50mg) 3m and 4/28 then 4 tables 4/29 and 4/30 then 3 tablets (30mg) 537mnd 5/2 then 2 tablet (17mg) 5/87md 5/4 then 1 tablet (10mg) 5/551m 5/6  by mouth as described STARTING ON 07/04/15) 32 tablet 0 07/11/2015 at Unknown time  . Tiotropium Bromide Monohydrate (SPIRIVA RESPIMAT) 2.5 MCG/ACT AERS Inhale 1 puff into the lungs as needed.    UNKNOWN  . nicotine (NICODERM CQ) 14 mg/24hr patch PLACE 1 14MG PATCH DAILY FOR 2 WEEKS 14 patch 0   . nicotine (NICODERM CQ) 21 mg/24hr patch PLACE 1 21 MG PATCH DAILY FOR 6 WEEKS 42 patch 0   . nicotine (NICODERM CQ) 7 mg/24hr patch PLACE 1 7 MG PATCH DAILY FOR 2 WEEKS 14 patch 0    Scheduled: . aspirin EC  81 mg Oral Daily  . atorvastatin  80 mg Oral q1800  . clopidogrel  75 mg Oral Q breakfast  . isosorbide mononitrate  120 mg Oral Daily  . metoprolol  75 mg Oral BID  . nicotine  14 mg Transdermal Daily  . [START ON 07/15/2015] pantoprazole (PROTONIX) IV  40 mg Intravenous Q12H  . sodium chloride flush  3 mL Intravenous Q12H   Continuous: . dextrose 5 % and 0.9% NaCl 100 mL/hr at 07/12/15 0623  . pantoprozole (PROTONIX) infusion 8 mg/hr (07/12/15 0623)   [id number], albuterol, ondansetron **OR** ondansetron (ZOFRAN) IV  Assesment: She has acute upper GI bleeding. She has acute blood loss anemia and she's had a blood transfusion. She had hypovolemia but not shock. That is better. She had acute kidney injury probably from dehydration and hypovolemia and that is better.  Her acute blood loss anemia is improved  She had non-STEMI and stent placement and is on anticoagulation which we have continued  She has COPD and had recent exacerbation.   Her platelet count is down and I'm not sure if that's from consumption. She's not on any heparin. This will be rechecked. Principal Problem:   Acute upper GI bleed Active Problems:   TOBACCO ABUSE   COPD (chronic obstructive pulmonary disease) (HCC)   NSTEMI (non-ST elevated myocardial infarction) (HCC)   Obesity (BMI 30-39.9)   Hx of CABG 2007   AKI (acute kidney injury) (HCC)   [place]    Plan: Continue  treatments. Monitor hemoglobin level closely. Continue with her anticoagulation. GI consultation is been requested and she's tentatively plan for upper endoscopy today. Follow troponin levels.  She remains critically ill with high risk for further cardiac problems and/or increased GI bleeding    LOS: 1 day   Voncile Schwarz L 07/12/2015, 7:46 AM

## 2015-07-12 NOTE — Op Note (Signed)
[facility] Patient Name: Christina Boyle Procedure Date: 07/12/2015 4:35 PM MRN: [id number] Date of Birth: [birth year] Attending MD: Hildred Laser , MD CSN: [id number] Age: 60 Admit Type: Inpatient Procedure:                Upper GI endoscopy Indications:              Acute post hemorrhagic anemia, Melena.patient has                            history of gastric ulcers diagnosed in May 2016.                            Biopsy revealed benign etiology and H. pylori                            stains were negative. Patient did not return for                            follow-up visit as recommended by Dr. [name]. Providers:                Hildred Laser, MD, Rosina Lowenstein, RN, Randa Spike, Technician Referring MD:             Jasper Loser. Luan Pulling, MD Medicines:                Cetacaine spray, Meperidine 50 mg IV, Midazolam 5                            mg IV Complications:            No immediate complications. Estimated Blood Loss:     Estimated blood loss: none. Procedure:                Pre-Anesthesia Assessment:                           - Prior to the procedure, a History and Physical                            was performed, and patient medications and                            allergies were reviewed. The patient's tolerance of                            previous anesthesia was also reviewed. The risks                            and benefits of the procedure and the sedation                            options and risks were discussed with the patient.  All questions were answered, and informed consent                            was obtained. Prior Anticoagulants: The patient                            last took aspirin 1 day and Plavix (clopidogrel) 1                            day prior to the procedure. ASA Grade Assessment:                            III - A patient with severe systemic disease. After             reviewing the risks and benefits, the patient was                            deemed in satisfactory condition to undergo the                            procedure.                           After obtaining informed consent, the endoscope was                            passed under direct vision. Throughout the                            procedure, the patient's blood pressure, pulse, and                            oxygen saturations were monitored continuously. The                            [id number]) scope was introduced through the                            mouth, and advanced to the second part of duodenum.                            The upper GI endoscopy was accomplished without                            difficulty. The patient tolerated the procedure                            well. Scope In: 4:59:59 PM Scope Out: 5:09:43 PM Total Procedure Duration: 0 hours 9 minutes 44 seconds  Findings:      The proximal esophagus and mid esophagus were normal.      Small plaques were found in the lower third of the esophagus. Cells for       cytology were obtained by brushing.      Three non-bleeding small gastric ulcers with no  stigmata of bleeding       were found in the gastric antrum. The largest lesion was 5 mm in largest       dimension.      One non-bleeding cratered gastric ulcer with pigmented material was       found in the gastric antrum. The lesion was 10 mm in largest dimension.      The exam of the stomach was otherwise normal.      A few erosions without bleeding were found in the duodenal bulb.      A few erosions without bleeding were found in the second portion of the       duodenum. Impression:               - Normal proximal esophagus and mid esophagus.                           - Plaques in the lower third of the esophagus.                            Cells for cytology obtained.                           - Non-bleeding gastric ulcers with no stigmata  of                            bleeding.                           - Non-bleeding gastric ulcer with pigmented                            material.                           - Duodenal erosions without bleeding.                           - Duodenal erosions without bleeding. Moderate Sedation:      Moderate (conscious) sedation was administered by the endoscopy nurse       and supervised by the endoscopist. The following parameters were       monitored: oxygen saturation, heart rate, blood pressure, CO2       capnography and response to care. Total physician intraservice time was       14 minutes. Recommendation:           - Return patient to ICU for ongoing care.                           - Full liquid diet today.                           - Use sucralfate tablets 1 gram PO QID today.                           - Resume aspirin tomorrow and Plavix (clopidogrel)  tomorrow at prior dosesas recommended by Dr. Johnny Bridge.                           - Patient can be discharge in 2 days if no evidence                            of recurrent bleed Procedure Code(s):        --- Professional ---                           [phone], Esophagogastroduodenoscopy, flexible,                            transoral; diagnostic, including collection of                            specimen(s) by brushing or washing, when performed                            (separate procedure)                           99152, Moderate sedation services provided by the                            same physician or other qualified health care                            professional performing the diagnostic or                            therapeutic service that the sedation supports,                            requiring the presence of an independent trained                            observer to assist in the monitoring of the                            patient's level of  consciousness and physiological                            status; initial 15 minutes of intraservice time,                            patient age 49 years or older Diagnosis Code(s):        --- Professional ---                           K22.8, Other specified diseases of esophagus  K25.9, Gastric ulcer, unspecified as acute or                            chronic, without hemorrhage or perforation                           K26.9, Duodenal ulcer, unspecified as acute or                            chronic, without hemorrhage or perforation                           D62, Acute posthemorrhagic anemia                           K92.1, Melena (includes Hematochezia) CPT copyright 2016 [employer]. All rights reserved. The codes documented in this report are preliminary and upon coder review may  be revised to meet current compliance requirements. Hildred Laser, MD Hildred Laser, MD 07/12/2015 5:38:51 PM This report has been signed electronically. Number of Addenda: 0

## 2015-07-12 NOTE — Consult Note (Signed)
Referring Provider: No ref. provider found Primary Care Physician:  Alonza Bogus, MD Primary Gastroenterologist:  Dr. Laural Golden  Reason for Consultation:    Melena and anemia the patient was and don't antiplatelet therapy and has history of gastric ulcers.  HPI:   Patient 60 year old Caucasian female with multiple medical problems will call feeling weak and profusely sweating. She went to the bathroom passed out. Patient was brought to emergency room for evaluation and noted to be hypotensive with a hemoglobin of 8.1 g. She was noted to have tarry stool which was guaiac positive in emergency room. Patient was admitted to ICU for further evaluation. Her hemoglobin dropped to 7.5 g. She has received 2 units of PRBCs and hemoglobin is decreased to 10.5 g. Since he has been ICU she hasn't had any more episodes of melena. She did experience transient chest tightness when she was in emergency room. Patient was noted to have mildly elevated troponin level. She's been evaluated by Dr. Zettie Cooley and felt to have demand ischemia. Patient's cardiac history significant for coronary artery disease. She had coronary artery bypass graft in December 2007. But 2 weeks ago she was admitted to this facility for axis ablation of COPD and subsequently transferred to [facility] for non-STEMI. She underwent cardiac cath and 06/30/2015 and 1 07/03/2015 she underwent stenting of ostial SVG to OM. Patient did well and was discharged on 07/04/2015. Patient states that she's been having tarry stools the last 1 week. She did not realize the significance. She complains of daily heartburn. Yesterday she did feel "funny in her abdomen" but did not have nausea vomiting or hematemesis.   she was hospitalized one year ago at [facility] with melena and anemia. She received 2 units of PRBCs. EGD by Dr. [name] revealed 2 gastric ulcers. Biopsy revealed benign etiology and H. pylori stains were negative. Patient was advised follow-up visit in  July 2016 but this never materialize. Patient is not sure when she stopped her PPI.   she is on low-dose aspirin and clopidogrel does not take other OTC NSAIDs.  Patient lives alone. She smokes cigarettes for over 40 years quit about 2 weeks ago. She does not drink alcohol. She has 3 daughters and 2 sons. Family history significant for an coronary artery disease in younger rather. Father died of MI at age 47. Mother died of metastatic breast carcinoma in her 87s.   Past Medical History  Diagnosis Date  . CAD (coronary artery disease)     Multivessel disease s/p CABG, DES SVG to OM  July 03 2015 (occluded SVG to PDA)  . Chronic bronchitis   . Coarctation of aorta   . Hyperlipidemia   . Essential hypertension   . COPD (chronic obstructive pulmonary disease) ([place])   . Depression   . GERD (gastroesophageal reflux disease)   . Cervical cancer ([place])   . Obesity (BMI 30-39.9)   . NSTEMI (non-ST elevated myocardial infarction) [facility])     April 2017    Past Surgical History  Procedure Laterality Date  . Breast biopsy Left     Benign  . Cholecystectomy    . Coronary artery bypass graft  02/2006    LIMA to LAD, SVG to OM, SVG to PDA  . Cervical cone biopsy    . Esophagogastroduodenoscopy N/A 07/22/2014    Procedure: ESOPHAGOGASTRODUODENOSCOPY (EGD);  Surgeon: [name], MD;  Location: [facility] ENDOSCOPY;  Service: Endoscopy;  Laterality: N/A;  . Cardiac catheterization N/A 06/30/2015    Procedure: Left Heart  Cath and Cors/Grafts Angiography;  Surgeon: Leonie Man, MD;  Location: [place] CV LAB;  Service: Cardiovascular;  Laterality: N/A;  . Cardiac catheterization N/A 07/03/2015    Procedure: Coronary Stent Intervention;  Surgeon: Peter M Martinique, MD;  Location: [place] CV LAB;  Service: Cardiovascular;  Laterality: N/A;    Prior to Admission medications   Medication Sig Start Date End Date Taking? Authorizing Provider  acetaminophen (TYLENOL) 500 MG tablet Take 1,000 mg by  mouth every 6 (six) hours as needed for moderate pain.   Yes Historical Provider, MD  albuterol (PROVENTIL HFA;VENTOLIN HFA) 108 (90 Base) MCG/ACT inhaler Inhale 1-2 puffs into the lungs every 6 (six) hours as needed for wheezing or shortness of breath.   Yes Historical Provider, MD  amLODipine (NORVASC) 5 MG tablet Take 1 tablet (5 mg total) by mouth daily. 07/04/15  Yes Bhavinkumar Bhagat, PA  aspirin EC 81 MG EC tablet Take 1 tablet (81 mg total) by mouth daily. 07/23/14  Yes [name], MD  atorvastatin (LIPITOR) 80 MG tablet TAKE ONE TABLET BY MOUTH ONCE DAILY 01/26/15  Yes [name], MD  clopidogrel (PLAVIX) 75 MG tablet Take 1 tablet (75 mg total) by mouth daily with breakfast. 07/04/15  Yes Bhavinkumar Bhagat, PA  furosemide (LASIX) 40 MG tablet Take 1 tablet (40 mg total) by mouth 2 (two) times daily. 07/04/15  Yes Bhavinkumar Bhagat, PA  hydrALAZINE (APRESOLINE) 50 MG tablet Take 1 tablet (50 mg total) by mouth every 8 (eight) hours. 07/04/15  Yes Bhavinkumar Bhagat, PA  isosorbide mononitrate (IMDUR) 120 MG 24 hr tablet Take 1 tablet (120 mg total) by mouth daily. 07/05/15  Yes Bhavinkumar Bhagat, PA  lisinopril (PRINIVIL,ZESTRIL) 20 MG tablet Take 1 tablet (20 mg total) by mouth daily. 07/04/15  Yes Bhavinkumar Bhagat, PA  metoprolol 75 MG TABS Take 75 mg by mouth 2 (two) times daily. 07/04/15  Yes Bhavinkumar Bhagat, PA  nitroGLYCERIN (NITROSTAT) 0.4 MG SL tablet Place 1 tablet (0.4 mg total) under the tongue every 5 (five) minutes as needed for chest pain. 01/26/15  Yes [name], MD  predniSONE (DELTASONE) 10 MG tablet Take 6 tablets (60mg ) 4/25 and 4/26 then 5 tablets (50mg ) 4/27 and 4/28 then 4 tables 4/29 and 4/30 then 3 tablets (30mg ) 5/1 and 5/2 then 2 tablet (20mg ) 5/3 and 5/4 then 1 tablet (10mg ) 5/5 and 5/6 by mouth as described Patient taking differently: Take 10-60 mg by mouth See admin instructions. Take 6 tablets (60mg ) 4/25 and 4/26 then 5 tablets (50mg ) 4/27  and 4/28 then 4 tables 4/29 and 4/30 then 3 tablets (30mg ) 5/1 and 5/2 then 2 tablet (20mg ) 5/3 and 5/4 then 1 tablet (10mg ) 5/5 and 5/6 by mouth as described STARTING ON 07/04/15 07/04/15  Yes Bhavinkumar Bhagat, PA  Tiotropium Bromide Monohydrate (SPIRIVA RESPIMAT) 2.5 MCG/ACT AERS Inhale 1 puff into the lungs as needed.    Yes Historical Provider, MD  nicotine (NICODERM CQ) 14 mg/24hr patch PLACE 1 14MG  PATCH DAILY FOR 2 WEEKS 06/12/15   [name], MD  nicotine (NICODERM CQ) 21 mg/24hr patch PLACE 1 21 MG PATCH DAILY FOR 6 WEEKS 06/12/15   [name], MD  nicotine (NICODERM CQ) 7 mg/24hr patch PLACE 1 7 MG PATCH DAILY FOR 2 WEEKS 06/12/15   [name], MD    Current Facility-Administered Medications  Medication Dose Route Frequency Provider Last Rate Last Dose  . acetaminophen (TYLENOL) tablet 650 mg  650 mg Oral  Q6H PRN Orvan Falconer, MD       Or  . acetaminophen (TYLENOL) suppository 650 mg  650 mg Rectal Q6H PRN Orvan Falconer, MD      . albuterol (PROVENTIL) (2.5 MG/3ML) 0.083% nebulizer solution 3 mL  3 mL Inhalation Q6H PRN Orvan Falconer, MD      . aspirin EC tablet 81 mg  81 mg Oral Daily Orvan Falconer, MD   81 mg at 07/12/15 1000  . atorvastatin (LIPITOR) tablet 80 mg  80 mg Oral q1800 Orvan Falconer, MD   80 mg at 07/11/15 2153  . clopidogrel (PLAVIX) tablet 75 mg  75 mg Oral Q breakfast Orvan Falconer, MD   75 mg at 07/12/15 0800  . dextrose 5 %-0.9 % sodium chloride infusion   Intravenous Continuous Orvan Falconer, MD 100 mL/hr at 07/12/15 1341    . isosorbide mononitrate (IMDUR) 24 hr tablet 120 mg  120 mg Oral Daily Orvan Falconer, MD   120 mg at 07/12/15 1000  . metoprolol tartrate (LOPRESSOR) tablet 75 mg  75 mg Oral BID Orvan Falconer, MD   75 mg at 07/11/15 2153  . ondansetron (ZOFRAN) tablet 4 mg  4 mg Oral Q6H PRN Orvan Falconer, MD       Or  . ondansetron [facility]) injection 4 mg  4 mg Intravenous Q6H PRN Orvan Falconer, MD      . pantoprazole (PROTONIX) 80 mg in sodium chloride 0.9 % 250 mL (0.32 mg/mL) infusion  8  mg/hr Intravenous Continuous Orvan Falconer, MD 25 mL/hr at 07/12/15 1200 8 mg/hr at 07/12/15 1200  . [START ON 07/15/2015] pantoprazole (PROTONIX) injection 40 mg  40 mg Intravenous Q12H Orvan Falconer, MD      . sodium chloride flush (NS) 0.9 % injection 3 mL  3 mL Intravenous Q12H Orvan Falconer, MD   3 mL at 07/11/15 2154    Allergies as of 07/11/2015  . (No Known Allergies)    Family History  Problem Relation Age of Onset  . Heart attack Father 66  . Lung cancer Mother 57    Social History   Social History  . Marital Status: Legally Separated    Spouse Name: N/A  . Number of Children: 5  . Years of Education: N/A   Occupational History  . Not on file.   Social History Main Topics  . Smoking status: Current Every Day Smoker -- 1.00 packs/day for 50 years    Types: Cigarettes    Start date: 05/21/1969  . Smokeless tobacco: Never Used  . Alcohol Use: No  . Drug Use: No  . Sexual Activity: Not Currently    Birth Control/ Protection: None   Other Topics Concern  . Not on file   Social History Narrative   Lives with sister.      Review of Systems: See HPI, otherwise normal ROS  Physical Exam: Temp:  [98.2 F (36.8 C)-98.9 F (37.2 C)] 98.5 F (36.9 C) (05/03 0729) Pulse Rate:  [60-79] 70 (05/03 1200) Resp:  [7-24] 24 (05/03 1200) BP: (89-134)/(35-73) 104/45 mmHg (05/03 1200) SpO2:  [95 %-100 %] 95 % (05/03 1200) Weight:  [189 lb 6 oz (85.9 kg)] 189 lb 6 oz (85.9 kg) (05/03 0500) Last BM Date: 07/11/15   Patient seen at bedside in ICU. Patient is alert and in no acute distress. Conjunctiva was pink. Sclerae nonicteric. Oropharyngeal mucosa is normal. Patient is edentulous. No neck masses or thyromegaly noted. Cardiac exam with regular rhythm normal S1 and S2. Grade  3/6 systolic ejection murmur best heard at aortic area. Auscultation of lungs revealed few rhonchi. Abdomen is full. Bowel sounds normal. On palpation is soft mild midepigastric tenderness. No organomegaly or  masses noted. Peripheral edema or clubbing noted.   Lab Results:  Recent Labs  07/12/15 0558 07/12/15 0822 07/12/15 1325  WBC 14.0* 16.1* 16.0*  HGB 10.7* 10.2* 10.6*  HCT 30.6* 29.7* 30.0*  PLT 71* 121* 122*   BMET  Recent Labs  07/11/15 1713 07/12/15 0558  NA 132* 138  K 4.1 4.1  CL 103 111  CO2  --  21*  GLUCOSE 213* 137*  BUN 88* 74*  CREATININE 1.90* 1.32*  CALCIUM  --  8.3*   LFT  Recent Labs  07/12/15 0558  PROT 4.9*  ALBUMIN 2.8*  AST 16  ALT 22  ALKPHOS 36*  BILITOT 2.1*   PT/INR No results for input(s): LABPROT, INR in the last 72 hours. Hepatitis Panel No results for input(s): HEPBSAG, HCVAB, HEPAIGM, HEPBIGM in the last 72 hours.  Studies/Results: Dg Chest Port 1 View  07/11/2015  CLINICAL DATA:  Unresponsive today. EXAM: PORTABLE CHEST 1 VIEW COMPARISON:  July 01, 2015 FINDINGS: The heart size and mediastinal contours are stable. The heart size is mildly enlarged. Both lungs are clear. The visualized skeletal structures are unremarkable. IMPRESSION: No active cardiopulmonary disease. Electronically Signed   By: Abelardo Diesel M.D.   On: 07/11/2015 19:33    Assessment;  Patient is 60 year old Caucasian female who presents with melena and anemia in the setting of dual antiplatelet therapy for coronary artery disease. Patient is status post stenting about 2 weeks ago. Patient has received 2 units of PRBCs and is hemodynamically stable. She has mildly elevated troponin levels felt to be due to demand ischemia. Suspect GI bleed secondary to peptic ulcer disease. She has history of gastric ulcers diagnosed 1 year ago when she presented with melena and anemia and received 2 units of PRBCs. Patient is doing to be stable from medical and cardiac standpoint for endoscopic evaluation and treatment.   Recommendations;  Diagnostic EGD this afternoon with therapeutic intention. Of reviewed the procedures to the patient and she is agreeable. Also talk with  her sister-in-law Butch Penny was at bedside. Patient will need chronic PPI therapy.   LOS: 1 day   Wendell Nicoson U  07/12/2015, 4:06 PM

## 2015-07-12 NOTE — Consult Note (Signed)
Reason for Consult: Positive troponins, GI bleed, recent NSTEMI and DES Referring Physician: Dr. Sinda Du Cardiologist: Dr. Carlyle Dolly Consulting Cardiologist: Dr. Satira Sark  Christina Boyle is a 60 y.o. female with multivessel CAD status post CABG in 2007 (LIMA to LAD, SVG-OM, SVG to PDA), recent NSTEMI in April with cardiac catheterization showing SVG-OM lesion 99% stenosed and occluded SVG-PDA. She underwent successful but suboptimal stenting of the ostial SVG to the OM due to difficulty positioning the stent at the origin. As a result the stent was deployed but only the distal portion of the stent was in the SVG. Plan per Dr. Martinique was to continue DAPT indefinitely. Would postpone any further PCI and allow stent to re-endothelialize. If she does have refractory symptoms, consider her for PCI of the native RCA with rotational atherectomy at a later date.   Patient now is admitted with a GI bleed, Hbg 7, hypotension and positive troponins of 0.09, 0.07, 0.46. She says she has been dizzy and having black stools for past week or so. Denies any chest pain, palpitations, dyspnea. Also having some vague epigastric discomfort this past weekend. Family present in room confirms this.  She has been NPO on the hospitalist service in anticipation of possible EGD per discussion with GI on call. Aspirin and Plavix have not been discontinued. She has received packed red cells and hemoglobin has come up to 10 range.  Also has HTN, mild-moderate aortic stenosis, echo 06/2015 EF 65-70% mean grad 14, AVA VTI 1.56, Coarctation of the aorta, carotid disease, tobacco abuse 1/2 ppd.  Past Medical History  Diagnosis Date  . CAD (coronary artery disease)     Multivessel disease s/p CABG, DES SVG to OM  July 03 2015 (occluded SVG to PDA)  . Chronic bronchitis   . Coarctation of aorta   . Hyperlipidemia   . Essential hypertension   . COPD (chronic obstructive pulmonary disease) ([place])   .  Depression   . GERD (gastroesophageal reflux disease)   . Cervical cancer ([place])   . Obesity (BMI 30-39.9)   . NSTEMI (non-ST elevated myocardial infarction) [facility])     April 2017    Past Surgical History  Procedure Laterality Date  . Breast biopsy Left     Benign  . Cholecystectomy    . Coronary artery bypass graft  02/2006    LIMA to LAD, SVG to OM, SVG to PDA  . Cervical cone biopsy    . Esophagogastroduodenoscopy N/A 07/22/2014    Procedure: ESOPHAGOGASTRODUODENOSCOPY (EGD);  Surgeon: [name], MD;  Location: [facility] ENDOSCOPY;  Service: Endoscopy;  Laterality: N/A;  . Cardiac catheterization N/A 06/30/2015    Procedure: Left Heart Cath and Cors/Grafts Angiography;  Surgeon: Leonie Man, MD;  Location: [place] CV LAB;  Service: Cardiovascular;  Laterality: N/A;  . Cardiac catheterization N/A 07/03/2015    Procedure: Coronary Stent Intervention;  Surgeon: Peter M Martinique, MD;  Location: [place] CV LAB;  Service: Cardiovascular;  Laterality: N/A;    Family History  Problem Relation Age of Onset  . Heart attack Father 40  . Lung cancer Mother 32    Social History:  reports that she has been smoking Cigarettes.  She started smoking about 46 years ago. She has a 50 pack-year smoking history. She has never used smokeless tobacco. She reports that she does not drink alcohol or use illicit drugs.  Allergies: No Known Allergies  Medications:  Scheduled Meds: . aspirin EC  81 mg Oral Daily  . atorvastatin  80 mg Oral q1800  . clopidogrel  75 mg Oral Q breakfast  . isosorbide mononitrate  120 mg Oral Daily  . metoprolol  75 mg Oral BID  . nicotine  14 mg Transdermal Daily  . [START ON 07/15/2015] pantoprazole (PROTONIX) IV  40 mg Intravenous Q12H  . sodium chloride flush  3 mL Intravenous Q12H   Continuous Infusions: . dextrose 5 % and 0.9% NaCl 100 mL/hr at 07/12/15 0800  . pantoprozole (PROTONIX) infusion 8 mg/hr (07/12/15 1030)   PRN Meds:.acetaminophen **OR**  acetaminophen, albuterol, ondansetron **OR** ondansetron (ZOFRAN) IV   Results for orders placed or performed during the hospital encounter of 07/11/15 (from the past 48 hour(s))  CBC with Differential     Status: Abnormal   Collection Time: 07/11/15  4:35 PM  Result Value Ref Range   WBC 21.5 (H) 4.0 - 10.5 K/uL   RBC 2.66 (L) 3.87 - 5.11 MIL/uL   Hemoglobin 8.1 (L) 12.0 - 15.0 g/dL   HCT 14.0 (L) 69.1 - 45.8 %   MCV 93.2 78.0 - 100.0 fL   MCH 30.5 26.0 - 34.0 pg   MCHC 32.7 30.0 - 36.0 g/dL   RDW 86.9 19.1 - 24.4 %   Platelets 163 150 - 400 K/uL   Neutrophils Relative % 90 %   Neutro Abs 19.4 (H) 1.7 - 7.7 K/uL   Lymphocytes Relative 6 %   Lymphs Abs 1.2 0.7 - 4.0 K/uL   Monocytes Relative 4 %   Monocytes Absolute 0.9 0.1 - 1.0 K/uL   Eosinophils Relative 0 %   Eosinophils Absolute 0.0 0.0 - 0.7 K/uL   Basophils Relative 0 %   Basophils Absolute 0.0 0.0 - 0.1 K/uL  I-stat troponin, ED     Status: Abnormal   Collection Time: 07/11/15  5:11 PM  Result Value Ref Range   Troponin i, poc 0.09 (HH) 0.00 - 0.08 ng/mL   Comment 3            Comment: Due to the release kinetics of cTnI, a negative result within the first hours of the onset of symptoms does not rule out myocardial infarction with certainty. If myocardial infarction is still suspected, repeat the test at appropriate intervals.   I-stat Chem 8, ED     Status: Abnormal   Collection Time: 07/11/15  5:13 PM  Result Value Ref Range   Sodium 132 (L) 135 - 145 mmol/L   Potassium 4.1 3.5 - 5.1 mmol/L   Chloride 103 101 - 111 mmol/L   BUN 88 (H) 6 - 20 mg/dL   Creatinine, Ser 3.90 (H) 0.44 - 1.00 mg/dL   Glucose, Bld 983 (H) 65 - 99 mg/dL   Calcium, Ion 1.80 (L) 1.13 - 1.30 mmol/L   TCO2 17 0 - 100 mmol/L   Hemoglobin 7.5 (L) 12.0 - 15.0 g/dL   HCT 15.2 (L) 70.7 - 03.1 %  I-stat troponin, ED     Status: None   Collection Time: 07/11/15  5:40 PM  Result Value Ref Range   Troponin i, poc 0.07 0.00 - 0.08 ng/mL    Comment 3            Comment: Due to the release kinetics of cTnI, a negative result within the first hours of the onset of symptoms does not rule out myocardial infarction with certainty. If myocardial infarction is still suspected, repeat the test at appropriate intervals.   Urinalysis, Routine w  reflex microscopic     Status: None   Collection Time: 07/11/15  6:00 PM  Result Value Ref Range   Color, Urine YELLOW YELLOW   APPearance CLEAR CLEAR   Specific Gravity, Urine 1.020 1.005 - 1.030   pH 5.0 5.0 - 8.0   Glucose, UA NEGATIVE NEGATIVE mg/dL   Hgb urine dipstick NEGATIVE NEGATIVE   Bilirubin Urine NEGATIVE NEGATIVE   Ketones, ur NEGATIVE NEGATIVE mg/dL   Protein, ur NEGATIVE NEGATIVE mg/dL   Nitrite NEGATIVE NEGATIVE   Leukocytes, UA NEGATIVE NEGATIVE    Comment: MICROSCOPIC NOT DONE ON URINES WITH NEGATIVE PROTEIN, BLOOD, LEUKOCYTES, NITRITE, OR GLUCOSE <1000 mg/dL.  Occult blood, poc device     Status: Abnormal   Collection Time: 07/11/15  7:23 PM  Result Value Ref Range   Fecal Occult Bld POSITIVE (A) NEGATIVE  Type and screen [facility]     Status: None (Preliminary result)   Collection Time: 07/11/15  7:45 PM  Result Value Ref Range   ABO/RH(D) A POS    Antibody Screen NEG    Sample Expiration 07/14/2015    Unit Number [id number]    Blood Component Type RED CELLS,LR    Unit division 00    Status of Unit ISSUED,FINAL    Transfusion Status OK TO TRANSFUSE    Crossmatch Result Compatible    Unit Number [id number]    Blood Component Type RED CELLS,LR    Unit division 00    Status of Unit ISSUED    Transfusion Status OK TO TRANSFUSE    Crossmatch Result Compatible   Prepare RBC     Status: None   Collection Time: 07/11/15  7:45 PM  Result Value Ref Range   Order Confirmation ORDER PROCESSED BY BLOOD BANK   ABO/Rh     Status: None   Collection Time: 07/11/15  7:45 PM  Result Value Ref Range   ABO/RH(D) A POS   CBC     Status: Abnormal    Collection Time: 07/11/15  7:45 PM  Result Value Ref Range   WBC 18.9 (H) 4.0 - 10.5 K/uL   RBC 2.61 (L) 3.87 - 5.11 MIL/uL   Hemoglobin 7.9 (L) 12.0 - 15.0 g/dL   HCT 65.0 (L) 52.5 - 79.8 %   MCV 92.3 78.0 - 100.0 fL   MCH 30.3 26.0 - 34.0 pg   MCHC 32.8 30.0 - 36.0 g/dL   RDW 87.6 78.1 - 18.3 %   Platelets 164 150 - 400 K/uL  CBC     Status: Abnormal   Collection Time: 07/12/15  5:58 AM  Result Value Ref Range   WBC 14.0 (H) 4.0 - 10.5 K/uL   RBC 3.52 (L) 3.87 - 5.11 MIL/uL   Hemoglobin 10.7 (L) 12.0 - 15.0 g/dL    Comment: DELTA CHECK NOTED RESULT REPEATED AND VERIFIED    HCT 30.6 (L) 36.0 - 46.0 %   MCV 86.9 78.0 - 100.0 fL   MCH 30.4 26.0 - 34.0 pg   MCHC 35.0 30.0 - 36.0 g/dL   RDW 74.0 (H) 02.1 - 40.6 %   Platelets 71 (L) 150 - 400 K/uL    Comment: SPECIMEN CHECKED FOR CLOTS PLATELET COUNT CONFIRMED BY SMEAR   Comprehensive metabolic panel     Status: Abnormal   Collection Time: 07/12/15  5:58 AM  Result Value Ref Range   Sodium 138 135 - 145 mmol/L   Potassium 4.1 3.5 - 5.1 mmol/L   Chloride 111 101 - 111  mmol/L   CO2 21 (L) 22 - 32 mmol/L   Glucose, Bld 137 (H) 65 - 99 mg/dL   BUN 74 (H) 6 - 20 mg/dL   Creatinine, Ser 1.32 (H) 0.44 - 1.00 mg/dL   Calcium 8.3 (L) 8.9 - 10.3 mg/dL   Total Protein 4.9 (L) 6.5 - 8.1 g/dL   Albumin 2.8 (L) 3.5 - 5.0 g/dL   AST 16 15 - 41 U/L   ALT 22 14 - 54 U/L   Alkaline Phosphatase 36 (L) 38 - 126 U/L   Total Bilirubin 2.1 (H) 0.3 - 1.2 mg/dL   GFR calc non Af Amer 42 (L) >60 mL/min   GFR calc Af Amer 49 (L) >60 mL/min    Comment: (NOTE) The eGFR has been calculated using the CKD EPI equation. This calculation has not been validated in all clinical situations. eGFR's persistently <60 mL/min signify possible Chronic Kidney Disease.    Anion gap 6 5 - 15  CBC     Status: Abnormal   Collection Time: 07/12/15  8:22 AM  Result Value Ref Range   WBC 16.1 (H) 4.0 - 10.5 K/uL   RBC 3.41 (L) 3.87 - 5.11 MIL/uL   Hemoglobin  10.2 (L) 12.0 - 15.0 g/dL   HCT 29.7 (L) 36.0 - 46.0 %   MCV 87.1 78.0 - 100.0 fL   MCH 29.9 26.0 - 34.0 pg   MCHC 34.3 30.0 - 36.0 g/dL   RDW 16.5 (H) 11.5 - 15.5 %   Platelets 121 (L) 150 - 400 K/uL  Troponin I (q 6hr x 3)     Status: Abnormal   Collection Time: 07/12/15  8:22 AM  Result Value Ref Range   Troponin I 0.46 (H) <0.031 ng/mL    Comment:        PERSISTENTLY INCREASED TROPONIN VALUES IN THE RANGE OF 0.04-0.49 ng/mL CAN BE SEEN IN:       -UNSTABLE ANGINA       -CONGESTIVE HEART FAILURE       -MYOCARDITIS       -CHEST TRAUMA       -ARRYHTHMIAS       -LATE PRESENTING MYOCARDIAL INFARCTION       -COPD   CLINICAL FOLLOW-UP RECOMMENDED.     Dg Chest Port 1 View  07/11/2015  CLINICAL DATA:  Unresponsive today. EXAM: PORTABLE CHEST 1 VIEW COMPARISON:  July 01, 2015 FINDINGS: The heart size and mediastinal contours are stable. The heart size is mildly enlarged. Both lungs are clear. The visualized skeletal structures are unremarkable. IMPRESSION: No active cardiopulmonary disease. Electronically Signed   By: Abelardo Diesel M.D.   On: 07/11/2015 19:33    Review of Systems  Constitutional: Positive for malaise/fatigue.  HENT: Negative.   Eyes: Negative.   Respiratory: Positive for shortness of breath.   Cardiovascular: Negative.   Gastrointestinal: Positive for abdominal pain and melena.  Genitourinary: Negative.   Musculoskeletal: Negative.   Neurological: Positive for dizziness and weakness.  Endo/Heme/Allergies: Bruises/bleeds easily.      Blood pressure 116/50, pulse 63, temperature 98.5 F (36.9 C), temperature source Oral, resp. rate 16, height '5\' 2"'$  (1.575 m), weight 189 lb 6 oz (85.9 kg), SpO2 95 %. Physical Exam PHYSICAL EXAM: Well-nournished, in no acute distress. Neck: bilateral carotid bruits vs murmur portrayedNo JVD, HJR, or thyroid enlargement Lungs: Decreased breath sounds with some wheezing without  rales, or rhonchi Cardiovascular: RRR, PMI not  displaced, 3/6 harsh systolic murmur LSB RSB,no gallops, bruit, thrill,  or heave. Abdomen: BS normal. Soft without organomegaly, masses, lesions or tenderness. Extremities: without cyanosis, clubbing or edema. Good distal pulses bilateral SKin: Warm, no lesions or rashes  Musculoskeletal: No deformities Neuro: no focal signs Echo 06/28/15 LV EF: 65% -   70%   ------------------------------------------------------------------- Indications:      Chest pain 786.51.   ------------------------------------------------------------------- History:   PMH:   Chronic obstructive pulmonary disease.  Risk factors:  Current tobacco use. Hypertension. Dyslipidemia.   ------------------------------------------------------------------- Study Conclusions   - Left ventricle: The cavity size was normal. Wall thickness was   increased in a pattern of moderate LVH. Systolic function was   vigorous. The estimated ejection fraction was in the range of 65%   to 70%. Wall motion was normal; there were no regional wall   motion abnormalities. Doppler parameters are consistent with   abnormal left ventricular relaxation (grade 1 diastolic   dysfunction). Doppler parameters are consistent with high   ventricular filling pressure. - Aortic valve: Moderately calcified annulus. Trileaflet; mildly   thickened, mildly calcified leaflets. There was mild to moderate   stenosis. Peak velocity (S): 237 cm/s. Mean gradient (S): 14 mm   Hg. Valve area (VTI): 1.56 cm^2. Valve area (Vmax): 1.46 cm^2.   Valve area (Vmean): 1.43 cm^2. - Mitral valve: Mildly calcified annulus. - Right ventricle: Systolic function was mildly reduced.  Cath: 06/30/2015  Ost RCA lesion, 95% stenosed. Mid RCA lesion, 50% stenosed. Dist RCA lesion, 70% stenosed. Post Atrio lesion, 80% stenosed.  100% occluded SVG-RPDA at the origin  Ost LM to LM lesion, 60% stenosed.  Likely ostial left main disease but with brisk antegrade flow in the LAD  system. Most notable lesion is the diagonal lesion.  3rd Diag lesion, 80% stenosed.  Ost Cx to Prox Cx lesion, 80% stenosed. Ost 1st Mrg to 1st Mrg lesion, 80% stenosed.  SVG-OM is widely patent with the exception of a 99% stenosis in the origin  LIMA was visualized by non-selective angiography due to inability to cannulate and . Significant disease in that portion of the left subclavian artery. The patient truly has 2 culprit lesions: Ostial SVG-OM and the occluded SVG-RCA with now ostial RCA disease. Both lesions are likely be difficult PCI. As the diagnostic procedure was somewhat difficult as far as engaging the LIMA graft, and significant amount of contrast was used with recent renal insufficiency, I felt it best to stop the procedure today with plans for having the patient return for 2 site PCI early next week. Plan:  Patient will return to nursing unit for continued medical care.  Would start heparin back 6-8 hours following sheath removal.  I have loaded with Plavix 300 mg and ordered 75 mg daily.  She needs aggressive blood pressure management. I tentatively posted her for PCI on Monday, pending renal function reassessment.     Coronary Stent Intervention 06/30/15       Conclusion       Origin lesion, 99% stenosed. Post intervention, there is a 20% residual stenosis.   1. Successful but suboptimal stenting of the ostial SVG to the OM due to difficulty positioning the stent at the origin. As a result the stent was deployed but only the distal portion of the stent was in the SVG.   Plan: continue DAPT indefinitely. Would postpone any further PCI and allow stent to re- endothelialize. If she does have refractory symptoms I would consider her for PCI of the native RCA with rotational atherectomy at a later date.  If asymptomatic I would treat this medically.      EKG NSR with LVH, repolarization changes, no acute change  Assessment/Plan:  Acute GI Bleed with hypotension on  dual antiplatelets after recent PCI. Hbg 7.9, now up to 10.7 after PRBC transfusions. Awaiting endoscopy. Plan to continue Plavix and ASA 81 mg for now in light of recent NSTEMI and DES intervention to vein graft just last week.. She has a prior history of antral ulcer based on chart review.  Elevated troponins without EKG changes most likely secondary to demand ishemia  NSTEMI 06/28/15 with suboptimal stenting of SVG-OM secondary to difficulty positioning stent. Also 100 SVG-PDA and 95% native RCA-treat medically but could potentially intervene at later date. DAPT recommended indefinitely-see above  Mild-Moderate Aortic stenosis  COPD with recent exacerbation-just quit smoking  History of antral ulcers without bleed 07/2014  Acute on CKD  Estella Husk PA-C  07/12/2015, 10:30 AM    Attending note:  Patient seen and examined. Reviewed records and updated the chart. Case discussed with Ms. Bonnell Public PA-C. Ms. Kela Millin presents with evidence of GI bleed, reporting dark stools over the last week, vague abdominal discomfort this past weekend, dizziness. She has had no chest pain. History includes NSTEMI in April with complex DES graft intervention just this past week by Dr. Martinique. I reviewed the catheterization notes. She has been on aspirin and Plavix is plan to continue indefinitely. Her history includes prior antral ulcers without active bleeding in 2016. She is currently nothing by mouth with GI consultation pending and plan for EGD. With packed red cell transfusion her blood pressure is stable and hemoglobin has gone up from 7.9 to the 10 range.  On examination this morning she denies any chest pain. Systolic blood pressure 159 to 115, heart rate in the 60s in sinus rhythm. Lungs are clear without labored breathing. Cardiac exam reveals RRR with 3/6 systolic murmur consistent with history of aortic stenosis. Lab work shows creatinine 1.3, troponin I of 0.46, most recent hemoglobin 10.2. ECG shows  sinus rhythm with LVH and stable repolarization abnormalities.  GI bleed as outlined above with prior history of antral ulcers based on chart review. She has been on dual antiplatelet therapy in light of recent NSTEMI and complex DES intervention to vein graft last week by Dr. Martinique. She is NPO with GI consultation pending. Elevated troponin I is most likely a component of demand ischemia rather than recurrent ACS. She should be able to proceed with EGD as planned. Agree with trying to maintain aspirin and Plavix for now, particularly in light of hemodynamic stability and improved hemoglobin after PRBC transfusion. We have a call in to interventional team to get additional input. Hopefully source of GI bleed can be documented and managed without interrupting DAPT, but this can be considered if critical. Risk of stent thrombosis would be quite high at this time off DAPT.  Satira Sark, M.D., F.A.C.C.

## 2015-07-13 DIAGNOSIS — I10 Essential (primary) hypertension: Secondary | ICD-10-CM

## 2015-07-13 DIAGNOSIS — E785 Hyperlipidemia, unspecified: Secondary | ICD-10-CM

## 2015-07-13 LAB — CBC WITH DIFFERENTIAL/PLATELET
BASOS ABS: 0 10*3/uL (ref 0.0–0.1)
Basophils Relative: 0 %
Eosinophils Absolute: 0.1 10*3/uL (ref 0.0–0.7)
Eosinophils Relative: 1 %
HEMATOCRIT: 29.2 % — AB (ref 36.0–46.0)
HEMOGLOBIN: 9.9 g/dL — AB (ref 12.0–15.0)
LYMPHS PCT: 27 %
Lymphs Abs: 3 10*3/uL (ref 0.7–4.0)
MCH: 30.2 pg (ref 26.0–34.0)
MCHC: 33.9 g/dL (ref 30.0–36.0)
MCV: 89 fL (ref 78.0–100.0)
Monocytes Absolute: 0.9 10*3/uL (ref 0.1–1.0)
Monocytes Relative: 8 %
NEUTROS ABS: 7.2 10*3/uL (ref 1.7–7.7)
NEUTROS PCT: 64 %
Platelets: 116 10*3/uL — ABNORMAL LOW (ref 150–400)
RBC: 3.28 MIL/uL — AB (ref 3.87–5.11)
RDW: 17.1 % — ABNORMAL HIGH (ref 11.5–15.5)
WBC: 11.2 10*3/uL — AB (ref 4.0–10.5)

## 2015-07-13 LAB — TYPE AND SCREEN
ABO/RH(D): A POS
ANTIBODY SCREEN: NEGATIVE
Unit division: 0
Unit division: 0

## 2015-07-13 MED ORDER — PANTOPRAZOLE SODIUM 40 MG PO TBEC
40.0000 mg | DELAYED_RELEASE_TABLET | Freq: Two times a day (BID) | ORAL | Status: DC
  Administered 2015-07-14 – 2015-07-15 (×3): 40 mg via ORAL
  Filled 2015-07-13 (×3): qty 1

## 2015-07-13 MED ORDER — LISINOPRIL 10 MG PO TABS
20.0000 mg | ORAL_TABLET | Freq: Every day | ORAL | Status: DC
  Administered 2015-07-13 – 2015-07-15 (×3): 20 mg via ORAL
  Filled 2015-07-13 (×3): qty 2

## 2015-07-13 MED ORDER — AMLODIPINE BESYLATE 5 MG PO TABS
5.0000 mg | ORAL_TABLET | Freq: Every day | ORAL | Status: DC
  Administered 2015-07-13 – 2015-07-15 (×3): 5 mg via ORAL
  Filled 2015-07-13 (×3): qty 1

## 2015-07-13 NOTE — Progress Notes (Signed)
  Subjective:  Patient feels better. She denies chest pain or shortness of breath. She also denies abdominal pain nausea or vomiting. No bowel movement reported today. She is hungry.  Objective: Blood pressure 158/38, pulse 68, temperature 97.6 F (36.4 C), temperature source Oral, resp. rate 18, height 5\' 2"  (1.575 m), weight 188 lb 15 oz (85.7 kg), SpO2 99 %. Patient is alert and in no acute distress. Abdomen is full but soft and nontender without organomegaly or masses. No LE edema or clubbing noted.  Labs/studies Results:   Recent Labs  07/12/15 0822 07/12/15 1325 07/13/15 0442  WBC 16.1* 16.0* 11.2*  HGB 10.2* 10.6* 9.9*  HCT 29.7* 30.0* 29.2*  PLT 121* 122* 116*    BMET   Recent Labs  07/11/15 1713 07/12/15 0558  NA 132* 138  K 4.1 4.1  CL 103 111  CO2  --  21*  GLUCOSE 213* 137*  BUN 88* 74*  CREATININE 1.90* 1.32*  CALCIUM  --  8.3*    LFT   Recent Labs  07/12/15 0558  PROT 4.9*  ALBUMIN 2.8*  AST 16  ALT 22  ALKPHOS 36*  BILITOT 2.1*     Assessment:  #1.Upper GI bleed secondary to multiple gastric ulcers noted on EGD yesterday. She and did not require therapeutic intervention. No evidence of recurrent GI bleed. Patient is back on low-dose aspirin and clopidogrel. Need for double dose PPI discuss with Dr. Johnny Bridge since there is potential for interaction with clopidogrel. He is in agreement with twice a day schedule of PPI given serious nature of her condition. #2. Anemia secondary to upper GI bleed. Patient has received 2 units of PRBCs. #3. Mild esophageal candidiasis esophagitis. KOH prep negative due to sampling error. #3. Coronary artery disease recent prevention with stenting. Mildly elevated troponin levels felt to be due to demand ischemia. #4. Thrombocytopenia possibly secondary to GI bleed. It is interesting to note that she had thrombocytopenia last May when she was hospitalized for GI bleed.  Recommendations:  Continue IV  pantoprazole until tomorrow morning at which time she will be transitioned to oral route.  CBC in a.m. Stool antigen for H. pylori.

## 2015-07-13 NOTE — Progress Notes (Signed)
Subjective: Events of yesterday noted. Cardiology and GI consultation noted and appreciated. She says she feels okay. She has no new complaints. She's not having any abdominal pain. She has no chest pain. She says her breathing is about like it always is when she is coughing up a lot of sputum. Her blood pressure has been erratic but mostly high and she is off most of her medications for blood pressure of a need to be restarted now that she is hemodynamically stable.  Objective: Vital signs in last 24 hours: Temp:  [97.2 F (36.2 C)-98.7 F (37.1 C)] 98.1 F (36.7 C) (05/04 0400) Pulse Rate:  [56-90] 57 (05/04 0500) Resp:  [14-49] 18 (05/04 0500) BP: (100-177)/(33-128) 177/55 mmHg (05/04 0500) SpO2:  [94 %-100 %] 96 % (05/04 0500) Weight:  [85.7 kg (188 lb 15 oz)] 85.7 kg (188 lb 15 oz) (05/04 0500) Weight change: -0.03 kg (-1.1 oz) Last BM Date: 07/11/15  Intake/Output from previous day: 05/03 0701 - 05/04 0700 In: 1640.4 [I.V.:1640.4] Out: 1950 [Urine:1950]  PHYSICAL EXAM General appearance: alert, cooperative and no distress Resp: clear to auscultation bilaterally Cardio: regular rate and rhythm, S1, S2 normal, no murmur, click, rub or gallop GI: soft, non-tender; bowel sounds normal; no masses,  no organomegaly Extremities: extremities normal, atraumatic, no cyanosis or edema  Lab Results:  Results for orders placed or performed during the hospital encounter of 07/11/15 (from the past 48 hour(s))  CBC with Differential     Status: Abnormal   Collection Time: 07/11/15  4:35 PM  Result Value Ref Range   WBC 21.5 (H) 4.0 - 10.5 K/uL   RBC 2.66 (L) 3.87 - 5.11 MIL/uL   Hemoglobin 8.1 (L) 12.0 - 15.0 g/dL   HCT 24.8 (L) 36.0 - 46.0 %   MCV 93.2 78.0 - 100.0 fL   MCH 30.5 26.0 - 34.0 pg   MCHC 32.7 30.0 - 36.0 g/dL   RDW 14.7 11.5 - 15.5 %   Platelets 163 150 - 400 K/uL   Neutrophils Relative % 90 %   Neutro Abs 19.4 (H) 1.7 - 7.7 K/uL   Lymphocytes Relative 6 %   Lymphs  Abs 1.2 0.7 - 4.0 K/uL   Monocytes Relative 4 %   Monocytes Absolute 0.9 0.1 - 1.0 K/uL   Eosinophils Relative 0 %   Eosinophils Absolute 0.0 0.0 - 0.7 K/uL   Basophils Relative 0 %   Basophils Absolute 0.0 0.0 - 0.1 K/uL  I-stat troponin, ED     Status: Abnormal   Collection Time: 07/11/15  5:11 PM  Result Value Ref Range   Troponin i, poc 0.09 (HH) 0.00 - 0.08 ng/mL   Comment 3            Comment: Due to the release kinetics of cTnI, a negative result within the first hours of the onset of symptoms does not rule out myocardial infarction with certainty. If myocardial infarction is still suspected, repeat the test at appropriate intervals.   I-stat Chem 8, ED     Status: Abnormal   Collection Time: 07/11/15  5:13 PM  Result Value Ref Range   Sodium 132 (L) 135 - 145 mmol/L   Potassium 4.1 3.5 - 5.1 mmol/L   Chloride 103 101 - 111 mmol/L   BUN 88 (H) 6 - 20 mg/dL   Creatinine, Ser 1.90 (H) 0.44 - 1.00 mg/dL   Glucose, Bld 213 (H) 65 - 99 mg/dL   Calcium, Ion 0.96 (L) 1.13 - 1.30  mmol/L   TCO2 17 0 - 100 mmol/L   Hemoglobin 7.5 (L) 12.0 - 15.0 g/dL   HCT 22.0 (L) 36.0 - 46.0 %  I-stat troponin, ED     Status: None   Collection Time: 07/11/15  5:40 PM  Result Value Ref Range   Troponin i, poc 0.07 0.00 - 0.08 ng/mL   Comment 3            Comment: Due to the release kinetics of cTnI, a negative result within the first hours of the onset of symptoms does not rule out myocardial infarction with certainty. If myocardial infarction is still suspected, repeat the test at appropriate intervals.   Urinalysis, Routine w reflex microscopic     Status: None   Collection Time: 07/11/15  6:00 PM  Result Value Ref Range   Color, Urine YELLOW YELLOW   APPearance CLEAR CLEAR   Specific Gravity, Urine 1.020 1.005 - 1.030   pH 5.0 5.0 - 8.0   Glucose, UA NEGATIVE NEGATIVE mg/dL   Hgb urine dipstick NEGATIVE NEGATIVE   Bilirubin Urine NEGATIVE NEGATIVE   Ketones, ur NEGATIVE NEGATIVE  mg/dL   Protein, ur NEGATIVE NEGATIVE mg/dL   Nitrite NEGATIVE NEGATIVE   Leukocytes, UA NEGATIVE NEGATIVE    Comment: MICROSCOPIC NOT DONE ON URINES WITH NEGATIVE PROTEIN, BLOOD, LEUKOCYTES, NITRITE, OR GLUCOSE <1000 mg/dL.  Occult blood, poc device     Status: Abnormal   Collection Time: 07/11/15  7:23 PM  Result Value Ref Range   Fecal Occult Bld POSITIVE (A) NEGATIVE  Type and screen [facility]     Status: None (Preliminary result)   Collection Time: 07/11/15  7:45 PM  Result Value Ref Range   ABO/RH(D) A POS    Antibody Screen NEG    Sample Expiration 07/14/2015    Unit Number [id number]    Blood Component Type RED CELLS,LR    Unit division 00    Status of Unit ISSUED,FINAL    Transfusion Status OK TO TRANSFUSE    Crossmatch Result Compatible    Unit Number [id number]    Blood Component Type RED CELLS,LR    Unit division 00    Status of Unit ISSUED    Transfusion Status OK TO TRANSFUSE    Crossmatch Result Compatible   Prepare RBC     Status: None   Collection Time: 07/11/15  7:45 PM  Result Value Ref Range   Order Confirmation ORDER PROCESSED BY BLOOD BANK   ABO/Rh     Status: None   Collection Time: 07/11/15  7:45 PM  Result Value Ref Range   ABO/RH(D) A POS   CBC     Status: Abnormal   Collection Time: 07/11/15  7:45 PM  Result Value Ref Range   WBC 18.9 (H) 4.0 - 10.5 K/uL   RBC 2.61 (L) 3.87 - 5.11 MIL/uL   Hemoglobin 7.9 (L) 12.0 - 15.0 g/dL   HCT 24.1 (L) 36.0 - 46.0 %   MCV 92.3 78.0 - 100.0 fL   MCH 30.3 26.0 - 34.0 pg   MCHC 32.8 30.0 - 36.0 g/dL   RDW 14.7 11.5 - 15.5 %   Platelets 164 150 - 400 K/uL  CBC     Status: Abnormal   Collection Time: 07/12/15  5:58 AM  Result Value Ref Range   WBC 14.0 (H) 4.0 - 10.5 K/uL   RBC 3.52 (L) 3.87 - 5.11 MIL/uL   Hemoglobin 10.7 (L) 12.0 - 15.0 g/dL  Comment: DELTA CHECK NOTED RESULT REPEATED AND VERIFIED    HCT 30.6 (L) 36.0 - 46.0 %   MCV 86.9 78.0 - 100.0 fL   MCH 30.4 26.0 - 34.0 pg    MCHC 35.0 30.0 - 36.0 g/dL   RDW 16.0 (H) 11.5 - 15.5 %   Platelets 71 (L) 150 - 400 K/uL    Comment: SPECIMEN CHECKED FOR CLOTS PLATELET COUNT CONFIRMED BY SMEAR   Comprehensive metabolic panel     Status: Abnormal   Collection Time: 07/12/15  5:58 AM  Result Value Ref Range   Sodium 138 135 - 145 mmol/L   Potassium 4.1 3.5 - 5.1 mmol/L   Chloride 111 101 - 111 mmol/L   CO2 21 (L) 22 - 32 mmol/L   Glucose, Bld 137 (H) 65 - 99 mg/dL   BUN 74 (H) 6 - 20 mg/dL   Creatinine, Ser 1.32 (H) 0.44 - 1.00 mg/dL   Calcium 8.3 (L) 8.9 - 10.3 mg/dL   Total Protein 4.9 (L) 6.5 - 8.1 g/dL   Albumin 2.8 (L) 3.5 - 5.0 g/dL   AST 16 15 - 41 U/L   ALT 22 14 - 54 U/L   Alkaline Phosphatase 36 (L) 38 - 126 U/L   Total Bilirubin 2.1 (H) 0.3 - 1.2 mg/dL   GFR calc non Af Amer 42 (L) >60 mL/min   GFR calc Af Amer 49 (L) >60 mL/min    Comment: (NOTE) The eGFR has been calculated using the CKD EPI equation. This calculation has not been validated in all clinical situations. eGFR's persistently <60 mL/min signify possible Chronic Kidney Disease.    Anion gap 6 5 - 15  CBC     Status: Abnormal   Collection Time: 07/12/15  8:22 AM  Result Value Ref Range   WBC 16.1 (H) 4.0 - 10.5 K/uL   RBC 3.41 (L) 3.87 - 5.11 MIL/uL   Hemoglobin 10.2 (L) 12.0 - 15.0 g/dL   HCT 29.7 (L) 36.0 - 46.0 %   MCV 87.1 78.0 - 100.0 fL   MCH 29.9 26.0 - 34.0 pg   MCHC 34.3 30.0 - 36.0 g/dL   RDW 16.5 (H) 11.5 - 15.5 %   Platelets 121 (L) 150 - 400 K/uL  Troponin I (q 6hr x 3)     Status: Abnormal   Collection Time: 07/12/15  8:22 AM  Result Value Ref Range   Troponin I 0.46 (H) <0.031 ng/mL    Comment:        PERSISTENTLY INCREASED TROPONIN VALUES IN THE RANGE OF 0.04-0.49 ng/mL CAN BE SEEN IN:       -UNSTABLE ANGINA       -CONGESTIVE HEART FAILURE       -MYOCARDITIS       -CHEST TRAUMA       -ARRYHTHMIAS       -LATE PRESENTING MYOCARDIAL INFARCTION       -COPD   CLINICAL FOLLOW-UP RECOMMENDED.   CBC      Status: Abnormal   Collection Time: 07/12/15  1:25 PM  Result Value Ref Range   WBC 16.0 (H) 4.0 - 10.5 K/uL   RBC 3.46 (L) 3.87 - 5.11 MIL/uL   Hemoglobin 10.6 (L) 12.0 - 15.0 g/dL   HCT 30.0 (L) 36.0 - 46.0 %   MCV 86.7 78.0 - 100.0 fL   MCH 30.6 26.0 - 34.0 pg   MCHC 35.3 30.0 - 36.0 g/dL   RDW 16.7 (H) 11.5 - 15.5 %  Platelets 122 (L) 150 - 400 K/uL  Troponin I (q 6hr x 3)     Status: Abnormal   Collection Time: 07/12/15  1:25 PM  Result Value Ref Range   Troponin I 0.37 (H) <0.031 ng/mL    Comment:        PERSISTENTLY INCREASED TROPONIN VALUES IN THE RANGE OF 0.04-0.49 ng/mL CAN BE SEEN IN:       -UNSTABLE ANGINA       -CONGESTIVE HEART FAILURE       -MYOCARDITIS       -CHEST TRAUMA       -ARRYHTHMIAS       -LATE PRESENTING MYOCARDIAL INFARCTION       -COPD   CLINICAL FOLLOW-UP RECOMMENDED.   KOH prep     Status: None   Collection Time: 07/12/15  5:12 PM  Result Value Ref Range   Specimen Description ESOPHAGUS BRUSHING    Special Requests NONE    KOH Prep      NO YEAST OR FUNGAL ELEMENTS SEEN Performed at [facility]    Report Status 07/12/2015 FINAL   Troponin I (q 6hr x 3)     Status: Abnormal   Collection Time: 07/12/15  8:22 PM  Result Value Ref Range   Troponin I 0.19 (H) <0.031 ng/mL    Comment:        PERSISTENTLY INCREASED TROPONIN VALUES IN THE RANGE OF 0.04-0.49 ng/mL CAN BE SEEN IN:       -UNSTABLE ANGINA       -CONGESTIVE HEART FAILURE       -MYOCARDITIS       -CHEST TRAUMA       -ARRYHTHMIAS       -LATE PRESENTING MYOCARDIAL INFARCTION       -COPD   CLINICAL FOLLOW-UP RECOMMENDED.   CBC with Differential/Platelet     Status: Abnormal   Collection Time: 07/13/15  4:42 AM  Result Value Ref Range   WBC 11.2 (H) 4.0 - 10.5 K/uL   RBC 3.28 (L) 3.87 - 5.11 MIL/uL   Hemoglobin 9.9 (L) 12.0 - 15.0 g/dL   HCT 29.2 (L) 36.0 - 46.0 %   MCV 89.0 78.0 - 100.0 fL   MCH 30.2 26.0 - 34.0 pg   MCHC 33.9 30.0 - 36.0 g/dL   RDW 17.1 (H) 11.5 -  15.5 %   Platelets 116 (L) 150 - 400 K/uL    Comment: SPECIMEN CHECKED FOR CLOTS CONSISTENT WITH PREVIOUS RESULT    Neutrophils Relative % 64 %   Neutro Abs 7.2 1.7 - 7.7 K/uL   Lymphocytes Relative 27 %   Lymphs Abs 3.0 0.7 - 4.0 K/uL   Monocytes Relative 8 %   Monocytes Absolute 0.9 0.1 - 1.0 K/uL   Eosinophils Relative 1 %   Eosinophils Absolute 0.1 0.0 - 0.7 K/uL   Basophils Relative 0 %   Basophils Absolute 0.0 0.0 - 0.1 K/uL    ABGS  Recent Labs  07/11/15 1713  TCO2 17   CULTURES Recent Results (from the past 240 hour(s))  KOH prep     Status: None   Collection Time: 07/12/15  5:12 PM  Result Value Ref Range Status   Specimen Description ESOPHAGUS BRUSHING  Final   Special Requests NONE  Final   KOH Prep   Final    NO YEAST OR FUNGAL ELEMENTS SEEN Performed at [facility]    Report Status 07/12/2015 FINAL  Final   Studies/Results: Dg Chest [place]  1 View  07/11/2015  CLINICAL DATA:  Unresponsive today. EXAM: PORTABLE CHEST 1 VIEW COMPARISON:  July 01, 2015 FINDINGS: The heart size and mediastinal contours are stable. The heart size is mildly enlarged. Both lungs are clear. The visualized skeletal structures are unremarkable. IMPRESSION: No active cardiopulmonary disease. Electronically Signed   By: Abelardo Diesel M.D.   On: 07/11/2015 19:33    Medications:  Prior to Admission:  Prescriptions prior to admission  Medication Sig Dispense Refill Last Dose  . acetaminophen (TYLENOL) 500 MG tablet Take 1,000 mg by mouth every 6 (six) hours as needed for moderate pain.   UNKWOWN  . albuterol (PROVENTIL HFA;VENTOLIN HFA) 108 (90 Base) MCG/ACT inhaler Inhale 1-2 puffs into the lungs every 6 (six) hours as needed for wheezing or shortness of breath.   07/11/2015 at Unknown time  . amLODipine (NORVASC) 5 MG tablet Take 1 tablet (5 mg total) by mouth daily. 30 tablet 6 07/11/2015 at Unknown time  . aspirin EC 81 MG EC tablet Take 1 tablet (81 mg total) by mouth daily.    07/11/2015 at Unknown time  . atorvastatin (LIPITOR) 80 MG tablet TAKE ONE TABLET BY MOUTH ONCE DAILY 90 tablet 3 07/10/2015 at Unknown time  . clopidogrel (PLAVIX) 75 MG tablet Take 1 tablet (75 mg total) by mouth daily with breakfast. 30 tablet 11 07/11/2015 at Unknown time  . furosemide (LASIX) 40 MG tablet Take 1 tablet (40 mg total) by mouth 2 (two) times daily. 60 tablet 3 07/11/2015 at Unknown time  . hydrALAZINE (APRESOLINE) 50 MG tablet Take 1 tablet (50 mg total) by mouth every 8 (eight) hours. 90 tablet 6 07/11/2015 at Unknown time  . isosorbide mononitrate (IMDUR) 120 MG 24 hr tablet Take 1 tablet (120 mg total) by mouth daily. 30 tablet 6 07/11/2015 at Unknown time  . lisinopril (PRINIVIL,ZESTRIL) 20 MG tablet Take 1 tablet (20 mg total) by mouth daily. 30 tablet 6 07/11/2015 at Unknown time  . metoprolol 75 MG TABS Take 75 mg by mouth 2 (two) times daily. 60 tablet 6 07/11/2015 at Unknown time  . nitroGLYCERIN (NITROSTAT) 0.4 MG SL tablet Place 1 tablet (0.4 mg total) under the tongue every 5 (five) minutes as needed for chest pain. 25 tablet 3 UNKNOWN  . predniSONE (DELTASONE) 10 MG tablet Take 6 tablets (70m) 4/25 and 4/26 then 5 tablets (586m 4/27 and 4/28 then 4 tables 4/29 and 4/30 then 3 tablets (3039m5/1 and 5/2 then 2 tablet (40m91m/3 and 5/4 then 1 tablet (10mg47m5 and 5/6 by mouth as described (Patient taking differently: Take 10-60 mg by mouth See admin instructions. Take 6 tablets (60mg)63m5 and 4/26 then 5 tablets (50mg) 53m and 4/28 then 4 tables 4/29 and 4/30 then 3 tablets (30mg) 532mnd 5/2 then 2 tablet (40mg) 5/68md 5/4 then 1 tablet (10mg) 5/553m 5/6 by mouth as described STARTING ON 07/04/15) 32 tablet 0 07/11/2015 at Unknown time  . Tiotropium Bromide Monohydrate (SPIRIVA RESPIMAT) 2.5 MCG/ACT AERS Inhale 1 puff into the lungs as needed.    UNKNOWN  . nicotine (NICODERM CQ) 14 mg/24hr patch PLACE 1 14MG PATCH DAILY FOR 2 WEEKS 14 patch 0   . nicotine (NICODERM CQ) 21 mg/24hr  patch PLACE 1 21 MG PATCH DAILY FOR 6 WEEKS 42 patch 0   . nicotine (NICODERM CQ) 7 mg/24hr patch PLACE 1 7 MG PATCH DAILY FOR 2 WEEKS 14 patch 0    Scheduled: . amLODipine  5 mg  Oral Daily  . aspirin EC  81 mg Oral Daily  . atorvastatin  80 mg Oral q1800  . clopidogrel  75 mg Oral Q breakfast  . isosorbide mononitrate  120 mg Oral Daily  . lisinopril  20 mg Oral Daily  . metoprolol  75 mg Oral BID  . nystatin  5 mL Oral QID  . [START ON 07/15/2015] pantoprazole (PROTONIX) IV  40 mg Intravenous Q12H  . sodium chloride flush  3 mL Intravenous Q12H  . sucralfate  1 g Oral TID WC & HS   Continuous: . dextrose 5 % and 0.9% NaCl 100 mL/hr at 07/13/15 0717  . pantoprozole (PROTONIX) infusion 8 mg/hr (07/12/15 2130)   [id number] **OR** acetaminophen, albuterol, ondansetron **OR** ondansetron (ZOFRAN) IV  Assesment: She was admitted with upper GI bleeding. The situation is complicated by her cardiac issues. She is much improved now. There does not appear to be any active bleeding and there was no bleeding noted on EGD. She's not having any chest pain. She has no other new complaints. She has severe COPD at baseline but has now stopped smoking. Her blood pressure has been up so she is going to restart some of her blood pressure medication now. She will continue on the Protonix drip. She still has some thrombocytopenia which also complicates things. Principal Problem:   Acute upper GI bleed Active Problems:   TOBACCO ABUSE   COPD (chronic obstructive pulmonary disease) (HCC)   Acute blood loss anemia   NSTEMI (non-ST elevated myocardial infarction) (HCC)   Obesity (BMI 30-39.9)   Hx of CABG 2007   AKI (acute kidney injury) ([place])   Upper GI bleeding   Thrombocytopenia (HCC)   Hypovolemia    Plan: She will stay in the ICU for today. She will continue all of her other treatments. No other new medications.    LOS: 2 days   Jafar Poffenberger L 07/13/2015, 7:42 AM

## 2015-07-13 NOTE — Progress Notes (Signed)
Primary cardiologist: Dr. Carlyle Dolly  Seen for followup: GI bleed, CAD with recent DES intervention  Subjective:    Tolerating clear liquids. No chest pain or shortness of breath at rest. No obvious melena.  Objective:   Temp:  [97.2 F (36.2 C)-98.7 F (37.1 C)] 98.1 F (36.7 C) (05/04 0400) Pulse Rate:  [56-90] 57 (05/04 0500) Resp:  [14-49] 18 (05/04 0500) BP: (100-177)/(33-128) 177/55 mmHg (05/04 0500) SpO2:  [94 %-100 %] 96 % (05/04 0500) Weight:  [188 lb 15 oz (85.7 kg)] 188 lb 15 oz (85.7 kg) (05/04 0500) Last BM Date: 07/11/15  Filed Weights   07/11/15 2139 07/12/15 0500 07/13/15 0500  Weight: 189 lb 6 oz (85.9 kg) 189 lb 6 oz (85.9 kg) 188 lb 15 oz (85.7 kg)    Intake/Output Summary (Last 24 hours) at 07/13/15 0746 Last data filed at 07/13/15 0230  Gross per 24 hour  Intake 1540.42 ml  Output   1300 ml  Net 240.42 ml    Telemetry: Sinus rhythm.  Exam:  General: No distress.  Lungs: Clear, nonlabored.  Cardiac: RRR with 3/6 systolic murmur consistent with aortic stenosis.  Abdomen: NABS.  Extremities: No pitting edema.  Lab Results:  Basic Metabolic Panel:  Recent Labs Lab 07/11/15 1713 07/12/15 0558  NA 132* 138  K 4.1 4.1  CL 103 111  CO2  --  21*  GLUCOSE 213* 137*  BUN 88* 74*  CREATININE 1.90* 1.32*  CALCIUM  --  8.3*    Liver Function Tests:  Recent Labs Lab 07/12/15 0558  AST 16  ALT 22  ALKPHOS 36*  BILITOT 2.1*  PROT 4.9*  ALBUMIN 2.8*    CBC:  Recent Labs Lab 07/12/15 0822 07/12/15 1325 07/13/15 0442  WBC 16.1* 16.0* 11.2*  HGB 10.2* 10.6* 9.9*  HCT 29.7* 30.0* 29.2*  MCV 87.1 86.7 89.0  PLT 121* 122* 116*    Cardiac Enzymes:  Recent Labs Lab 07/12/15 0822 07/12/15 1325 07/12/15 2022  TROPONINI 0.46* 0.37* 0.19*    Medications:   Scheduled Medications: . amLODipine  5 mg Oral Daily  . aspirin EC  81 mg Oral Daily  . atorvastatin  80 mg Oral q1800  . clopidogrel  75 mg Oral Q  breakfast  . isosorbide mononitrate  120 mg Oral Daily  . lisinopril  20 mg Oral Daily  . metoprolol  75 mg Oral BID  . nystatin  5 mL Oral QID  . [START ON 07/15/2015] pantoprazole (PROTONIX) IV  40 mg Intravenous Q12H  . sodium chloride flush  3 mL Intravenous Q12H  . sucralfate  1 g Oral TID WC & HS    Infusions: . dextrose 5 % and 0.9% NaCl 100 mL/hr at 07/13/15 0717  . pantoprozole (PROTONIX) infusion 8 mg/hr (07/12/15 2130)    PRN Medications: acetaminophen **OR** acetaminophen, albuterol, ondansetron **OR** ondansetron (ZOFRAN) IV   Assessment:   1. Recent melena and suspected upper GI bleed. Patient evaluated by Dr. Laural Golden and underwent EGD yesterday, report reviewed. Astra ulcers noted as well as duodenal erosions, however no active bleeding documented. She is being treated with sucralfate and IV Protonix. Hgb 9.9 yesterday down from 10.2.  2. Minor increase in troponin I, likely demand ischemia in the setting of recent severe anemia and hypotension.  3. Multivessel CAD status post CABG with documented graft disease and recent DES intervention (April 24) to the SVG to OM (suboptimal), also documented occlusion of the SVG to PDA. She is being  continued on aspirin and Plavix at this time given high risk for stent thrombosis with discontinuation. Situation reviewed with Dr. [place] yesterday.  4. Essential hypertension, blood pressure stable.  5. Hyperlipidemia,, continues on Lipitor.   Plan/Discussion:    Complicated situation, patient remains at high risk for adverse complications including recurrent bleeding and ACS. Plan is to continue medical therapy including aspirin and Plavix at this point. She is being followed closely in the ICU with input from GI.   Satira Sark, M.D., F.A.C.C.

## 2015-07-13 NOTE — Clinical Documentation Improvement (Signed)
Cardiology Internal Medicine  Can the diagnosis of "AoCKD" be further specified by stage of CKD ? Thank you    CKD Stage I - GFR greater than or equal to 90  CKD Stage II - GFR 60-89  CKD Stage III - GFR 30-59  CKD Stage IV - GFR 15-29  CKD Stage V - GFR < 15  Other condition  Unable to clinically determine   Supporting Information: Demand Ischemia, HTN, Dehydration,   GFR 42/ BUN 88,  74/ Crt 1.90, 1.32  Treatment: IVF @100  ml/hr  Please exercise your independent, professional judgment when responding. A specific answer is not anticipated or expected.   Thank You, [place] [phone]

## 2015-07-14 ENCOUNTER — Ambulatory Visit: Payer: Medicaid Other | Admitting: Adult Health

## 2015-07-14 DIAGNOSIS — N183 Chronic kidney disease, stage 3 unspecified: Secondary | ICD-10-CM | POA: Diagnosis present

## 2015-07-14 DIAGNOSIS — I2510 Atherosclerotic heart disease of native coronary artery without angina pectoris: Secondary | ICD-10-CM

## 2015-07-14 LAB — CBC
HCT: 27.7 % — ABNORMAL LOW (ref 36.0–46.0)
HEMOGLOBIN: 9.5 g/dL — AB (ref 12.0–15.0)
MCH: 30.8 pg (ref 26.0–34.0)
MCHC: 34.3 g/dL (ref 30.0–36.0)
MCV: 89.9 fL (ref 78.0–100.0)
Platelets: 106 10*3/uL — ABNORMAL LOW (ref 150–400)
RBC: 3.08 MIL/uL — ABNORMAL LOW (ref 3.87–5.11)
RDW: 16.9 % — AB (ref 11.5–15.5)
WBC: 10.7 10*3/uL — AB (ref 4.0–10.5)

## 2015-07-14 NOTE — Progress Notes (Signed)
Pt left the floor with nursing staff.  Pt in no distress.  Belongings, paper chart, and medications sent with patient.  Report called to Dept. 300 RN.   Vitals signs as follows:  Temp: 98.2 F (36.8 C) (05/05 0800) Temp Source: Oral (05/05 0800) BP: 136/44 mmHg (05/05 1000) Pulse Rate: 65 (05/05 1000) Respirations: 0

## 2015-07-14 NOTE — Care Management Note (Signed)
Case Management Note  Patient Details  Name: Christina Boyle MRN: [id number] Date of Birth: [birth year]  Subjective/Objective:                  Pt is from home lives alone and is ind with ADL's. Pt has a cane but needs a rollator. Pt states she uses [employer] for DME. Order will be faxed once received from ED and pt will pick up rollator from store. Pt has PCP, transportation and no difficulty affording medications. Pt plans to return home with self care at DC.   Action/Plan: Order for rollator will be faxed to [facility] once received, anticipate DC home this weekend. No further CM needs.   Expected Discharge Date:  07/15/15               Expected Discharge Plan:  Home/Self Care  In-House Referral:  NA  Discharge planning Services  CM Consult  Post Acute Care Choice:  Durable Medical Equipment Choice offered to:  Patient  DME Arranged:  [place]ford Rile rolling with seat DME Agency:  [employer]  HH Arranged:    [place] Agency:     Status of Service:  Completed, signed off  Medicare Important Message Given:    Date Medicare IM Given:    Medicare IM give by:    Date Additional Medicare IM Given:    Additional Medicare Important Message give by:     If discussed at [place] of Stay Meetings, dates discussed:    Additional Comments:  Sherald Barge, RN 07/14/2015, 1:02 PM

## 2015-07-14 NOTE — Progress Notes (Signed)
Primary cardiologist: Dr. Carlyle Dolly  Seen for followup: GI bleed, CAD with recent DES intervention  Subjective:    Patient in bedside chair this morning, ate breakfast. She has had no nausea or abdominal pain, no obvious bleeding. No angina.  Objective:   Temp:  [97.5 F (36.4 C)-99.4 F (37.4 C)] 98.7 F (37.1 C) (05/05 0400) Pulse Rate:  [58-75] 66 (05/05 0741) Resp:  [0-36] 0 (05/05 0500) BP: (123-160)/(28-106) 160/55 mmHg (05/05 0741) SpO2:  [94 %-99 %] 97 % (05/05 0500) Weight:  [188 lb 15 oz (85.7 kg)] 188 lb 15 oz (85.7 kg) (05/05 0500) Last BM Date: 07/11/15  Filed Weights   07/12/15 0500 07/13/15 0500 07/14/15 0500  Weight: 189 lb 6 oz (85.9 kg) 188 lb 15 oz (85.7 kg) 188 lb 15 oz (85.7 kg)    Intake/Output Summary (Last 24 hours) at 07/14/15 0820 Last data filed at 07/13/15 1900  Gross per 24 hour  Intake 2413.33 ml  Output      0 ml  Net 2413.33 ml    Telemetry: Sinus rhythm.  Exam:  General: No distress.  Lungs: Clear, nonlabored.  Cardiac: RRR with 3/6 systolic murmur consistent with aortic stenosis.  Abdomen: NABS.  Extremities: No pitting edema.  Lab Results:  Basic Metabolic Panel:  Recent Labs Lab 07/11/15 1713 07/12/15 0558  NA 132* 138  K 4.1 4.1  CL 103 111  CO2  --  21*  GLUCOSE 213* 137*  BUN 88* 74*  CREATININE 1.90* 1.32*  CALCIUM  --  8.3*    CBC:  Recent Labs Lab 07/12/15 1325 07/13/15 0442 07/14/15 0430  WBC 16.0* 11.2* 10.7*  HGB 10.6* 9.9* 9.5*  HCT 30.0* 29.2* 27.7*  MCV 86.7 89.0 89.9  PLT 122* 116* 106*    Cardiac Enzymes:  Recent Labs Lab 07/12/15 0822 07/12/15 1325 07/12/15 2022  TROPONINI 0.46* 0.37* 0.19*    Medications:   Scheduled Medications: . amLODipine  5 mg Oral Daily  . aspirin EC  81 mg Oral Daily  . atorvastatin  80 mg Oral q1800  . clopidogrel  75 mg Oral Q breakfast  . isosorbide mononitrate  120 mg Oral Daily  . lisinopril  20 mg Oral Daily  . metoprolol  75 mg  Oral BID  . nystatin  5 mL Oral QID  . pantoprazole  40 mg Oral BID AC  . [START ON 07/15/2015] pantoprazole (PROTONIX) IV  40 mg Intravenous Q12H  . sodium chloride flush  3 mL Intravenous Q12H  . sucralfate  1 g Oral TID WC & HS    Infusions: . dextrose 5 % and 0.9% NaCl 100 mL/hr at 07/14/15 0239    PRN Medications: acetaminophen **OR** acetaminophen, albuterol, ondansetron **OR** ondansetron (ZOFRAN) IV   Assessment:   1. Upper GI bleed with multiple gastric ulcers documented by EGD per Dr. Laural Golden  She is on twice daily scheduled PPI at this time. Hemoglobin 9.9 down to 9.5.  2. Minor increase in troponin I, likely demand ischemia in the setting of recent severe anemia and hypotension.  3. Multivessel CAD status post CABG with documented graft disease and recent DES intervention (April 24) to the SVG to OM (suboptimal), also documented occlusion of the SVG to PDA. She is being continued on aspirin and Plavix at this time given high risk for stent thrombosis with discontinuation. Situation reviewed with Dr. Martinique.  4. Essential hypertension, blood pressure stable.  5. Hyperlipidemia,, continues on Lipitor.   Plan/Discussion:  No change in current cardiac regimen. Complicated situation, patient remains at risk for further adverse events (recurrent GI bleed or ACS), but at this point is clinically stable. She is likely transitioning out of telemetry today with advancing diet.   Satira Sark, M.D., F.A.C.C.

## 2015-07-14 NOTE — Progress Notes (Signed)
Subjective: She says she feels better. She has no new complaints. No chest pain. No nausea or vomiting. No abdominal pain.  Objective: Vital signs in last 24 hours: Temp:  [97.5 F (36.4 C)-99.4 F (37.4 C)] 98.2 F (36.8 C) (05/05 0800) Pulse Rate:  [58-75] 66 (05/05 0741) Resp:  [0-36] 0 (05/05 0500) BP: (123-160)/(28-106) 160/55 mmHg (05/05 0741) SpO2:  [94 %-99 %] 97 % (05/05 0500) Weight:  [85.7 kg (188 lb 15 oz)] 85.7 kg (188 lb 15 oz) (05/05 0500) Weight change: 0 kg (0 lb) Last BM Date: 07/13/15  Intake/Output from previous day: 05/04 0701 - 05/05 0700 In: 4290 [P.O.:1020; I.V.:3270] Out: -   PHYSICAL EXAM General appearance: alert, cooperative and no distress Resp: rhonchi bilaterally Cardio: regular rate and rhythm, S1, S2 normal, no murmur, click, rub or gallop GI: soft, non-tender; bowel sounds normal; no masses,  no organomegaly Extremities: extremities normal, atraumatic, no cyanosis or edema  Lab Results:  Results for orders placed or performed during the hospital encounter of 07/11/15 (from the past 48 hour(s))  CBC     Status: Abnormal   Collection Time: 07/12/15  1:25 PM  Result Value Ref Range   WBC 16.0 (H) 4.0 - 10.5 K/uL   RBC 3.46 (L) 3.87 - 5.11 MIL/uL   Hemoglobin 10.6 (L) 12.0 - 15.0 g/dL   HCT 30.0 (L) 36.0 - 46.0 %   MCV 86.7 78.0 - 100.0 fL   MCH 30.6 26.0 - 34.0 pg   MCHC 35.3 30.0 - 36.0 g/dL   RDW 16.7 (H) 11.5 - 15.5 %   Platelets 122 (L) 150 - 400 K/uL  Troponin I (q 6hr x 3)     Status: Abnormal   Collection Time: 07/12/15  1:25 PM  Result Value Ref Range   Troponin I 0.37 (H) <0.031 ng/mL    Comment:        PERSISTENTLY INCREASED TROPONIN VALUES IN THE RANGE OF 0.04-0.49 ng/mL CAN BE SEEN IN:       -UNSTABLE ANGINA       -CONGESTIVE HEART FAILURE       -MYOCARDITIS       -CHEST TRAUMA       -ARRYHTHMIAS       -LATE PRESENTING MYOCARDIAL INFARCTION       -COPD   CLINICAL FOLLOW-UP RECOMMENDED.   KOH prep     Status: None    Collection Time: 07/12/15  5:12 PM  Result Value Ref Range   Specimen Description ESOPHAGUS BRUSHING    Special Requests NONE    KOH Prep      NO YEAST OR FUNGAL ELEMENTS SEEN Performed at [facility]    Report Status 07/12/2015 FINAL   Troponin I (q 6hr x 3)     Status: Abnormal   Collection Time: 07/12/15  8:22 PM  Result Value Ref Range   Troponin I 0.19 (H) <0.031 ng/mL    Comment:        PERSISTENTLY INCREASED TROPONIN VALUES IN THE RANGE OF 0.04-0.49 ng/mL CAN BE SEEN IN:       -UNSTABLE ANGINA       -CONGESTIVE HEART FAILURE       -MYOCARDITIS       -CHEST TRAUMA       -ARRYHTHMIAS       -LATE PRESENTING MYOCARDIAL INFARCTION       -COPD   CLINICAL FOLLOW-UP RECOMMENDED.   CBC with Differential/Platelet     Status: Abnormal   Collection  Time: 07/13/15  4:42 AM  Result Value Ref Range   WBC 11.2 (H) 4.0 - 10.5 K/uL   RBC 3.28 (L) 3.87 - 5.11 MIL/uL   Hemoglobin 9.9 (L) 12.0 - 15.0 g/dL   HCT 29.2 (L) 36.0 - 46.0 %   MCV 89.0 78.0 - 100.0 fL   MCH 30.2 26.0 - 34.0 pg   MCHC 33.9 30.0 - 36.0 g/dL   RDW 17.1 (H) 11.5 - 15.5 %   Platelets 116 (L) 150 - 400 K/uL    Comment: SPECIMEN CHECKED FOR CLOTS CONSISTENT WITH PREVIOUS RESULT    Neutrophils Relative % 64 %   Neutro Abs 7.2 1.7 - 7.7 K/uL   Lymphocytes Relative 27 %   Lymphs Abs 3.0 0.7 - 4.0 K/uL   Monocytes Relative 8 %   Monocytes Absolute 0.9 0.1 - 1.0 K/uL   Eosinophils Relative 1 %   Eosinophils Absolute 0.1 0.0 - 0.7 K/uL   Basophils Relative 0 %   Basophils Absolute 0.0 0.0 - 0.1 K/uL  CBC     Status: Abnormal   Collection Time: 07/14/15  4:30 AM  Result Value Ref Range   WBC 10.7 (H) 4.0 - 10.5 K/uL   RBC 3.08 (L) 3.87 - 5.11 MIL/uL   Hemoglobin 9.5 (L) 12.0 - 15.0 g/dL   HCT 27.7 (L) 36.0 - 46.0 %   MCV 89.9 78.0 - 100.0 fL   MCH 30.8 26.0 - 34.0 pg   MCHC 34.3 30.0 - 36.0 g/dL   RDW 16.9 (H) 11.5 - 15.5 %   Platelets 106 (L) 150 - 400 K/uL    Comment: SPECIMEN CHECKED FOR  CLOTS CONSISTENT WITH PREVIOUS RESULT     ABGS  Recent Labs  07/11/15 1713  TCO2 17   CULTURES Recent Results (from the past 240 hour(s))  KOH prep     Status: None   Collection Time: 07/12/15  5:12 PM  Result Value Ref Range Status   Specimen Description ESOPHAGUS BRUSHING  Final   Special Requests NONE  Final   KOH Prep   Final    NO YEAST OR FUNGAL ELEMENTS SEEN Performed at [facility]    Report Status 07/12/2015 FINAL  Final   Studies/Results: No results found.  Medications:  Prior to Admission:  Prescriptions prior to admission  Medication Sig Dispense Refill Last Dose  . acetaminophen (TYLENOL) 500 MG tablet Take 1,000 mg by mouth every 6 (six) hours as needed for moderate pain.   UNKWOWN  . albuterol (PROVENTIL HFA;VENTOLIN HFA) 108 (90 Base) MCG/ACT inhaler Inhale 1-2 puffs into the lungs every 6 (six) hours as needed for wheezing or shortness of breath.   07/11/2015 at Unknown time  . amLODipine (NORVASC) 5 MG tablet Take 1 tablet (5 mg total) by mouth daily. 30 tablet 6 07/11/2015 at Unknown time  . aspirin EC 81 MG EC tablet Take 1 tablet (81 mg total) by mouth daily.   07/11/2015 at Unknown time  . atorvastatin (LIPITOR) 80 MG tablet TAKE ONE TABLET BY MOUTH ONCE DAILY 90 tablet 3 07/10/2015 at Unknown time  . clopidogrel (PLAVIX) 75 MG tablet Take 1 tablet (75 mg total) by mouth daily with breakfast. 30 tablet 11 07/11/2015 at Unknown time  . furosemide (LASIX) 40 MG tablet Take 1 tablet (40 mg total) by mouth 2 (two) times daily. 60 tablet 3 07/11/2015 at Unknown time  . hydrALAZINE (APRESOLINE) 50 MG tablet Take 1 tablet (50 mg total) by mouth every 8 (eight)  hours. 90 tablet 6 07/11/2015 at Unknown time  . isosorbide mononitrate (IMDUR) 120 MG 24 hr tablet Take 1 tablet (120 mg total) by mouth daily. 30 tablet 6 07/11/2015 at Unknown time  . lisinopril (PRINIVIL,ZESTRIL) 20 MG tablet Take 1 tablet (20 mg total) by mouth daily. 30 tablet 6 07/11/2015 at Unknown time  .  metoprolol 75 MG TABS Take 75 mg by mouth 2 (two) times daily. 60 tablet 6 07/11/2015 at Unknown time  . nitroGLYCERIN (NITROSTAT) 0.4 MG SL tablet Place 1 tablet (0.4 mg total) under the tongue every 5 (five) minutes as needed for chest pain. 25 tablet 3 UNKNOWN  . predniSONE (DELTASONE) 10 MG tablet Take 6 tablets (60mg ) 4/25 and 4/26 then 5 tablets (50mg ) 4/27 and 4/28 then 4 tables 4/29 and 4/30 then 3 tablets (30mg ) 5/1 and 5/2 then 2 tablet (20mg ) 5/3 and 5/4 then 1 tablet (10mg ) 5/5 and 5/6 by mouth as described (Patient taking differently: Take 10-60 mg by mouth See admin instructions. Take 6 tablets (60mg ) 4/25 and 4/26 then 5 tablets (50mg ) 4/27 and 4/28 then 4 tables 4/29 and 4/30 then 3 tablets (30mg ) 5/1 and 5/2 then 2 tablet (20mg ) 5/3 and 5/4 then 1 tablet (10mg ) 5/5 and 5/6 by mouth as described STARTING ON 07/04/15) 32 tablet 0 07/11/2015 at Unknown time  . Tiotropium Bromide Monohydrate (SPIRIVA RESPIMAT) 2.5 MCG/ACT AERS Inhale 1 puff into the lungs as needed.    UNKNOWN  . nicotine (NICODERM CQ) 14 mg/24hr patch PLACE 1 14MG  PATCH DAILY FOR 2 WEEKS 14 patch 0   . nicotine (NICODERM CQ) 21 mg/24hr patch PLACE 1 21 MG PATCH DAILY FOR 6 WEEKS 42 patch 0   . nicotine (NICODERM CQ) 7 mg/24hr patch PLACE 1 7 MG PATCH DAILY FOR 2 WEEKS 14 patch 0    Scheduled: . amLODipine  5 mg Oral Daily  . aspirin EC  81 mg Oral Daily  . atorvastatin  80 mg Oral q1800  . clopidogrel  75 mg Oral Q breakfast  . isosorbide mononitrate  120 mg Oral Daily  . lisinopril  20 mg Oral Daily  . metoprolol  75 mg Oral BID  . nystatin  5 mL Oral QID  . pantoprazole  40 mg Oral BID AC  . [START ON 07/15/2015] pantoprazole (PROTONIX) IV  40 mg Intravenous Q12H  . sodium chloride flush  3 mL Intravenous Q12H  . sucralfate  1 g Oral TID WC & HS   Continuous: . dextrose 5 % and 0.9% NaCl 100 mL/hr at 07/14/15 0239   [id number] **OR** acetaminophen, albuterol, ondansetron **OR** ondansetron (ZOFRAN)  IV  Assesment: She was admitted with an acute upper GI bleed. She had a non-STEMI. She has thrombocytopenia probably related to her GI bleeding. She had acute kidney injury and at baseline has chronic kidney disease. She has no longer been bleeding. She generally feels better. At baseline she has pretty severe COPD and that is stable. Principal Problem:   Acute upper GI bleed Active Problems:   TOBACCO ABUSE   COPD (chronic obstructive pulmonary disease) (HCC)   Acute blood loss anemia   NSTEMI (non-ST elevated myocardial infarction) (HCC)   Obesity (BMI 30-39.9)   Hx of CABG 2007   AKI (acute kidney injury) ([place])   Upper GI bleeding   Thrombocytopenia (HCC)   Hypovolemia   CKD (chronic kidney disease) stage 3, GFR 30-59 ml/min    Plan: I think she is ready to transfer out of ICU now.  Discontinue IV Protonix and switch to oral    LOS: 3 days   Deandra Goering L 07/14/2015, 9:52 AM

## 2015-07-14 NOTE — Progress Notes (Signed)
  Subjective:  Patient feels better. She denies nausea vomiting abdominal pain chest pain or shortness of breath. She did have bowel movement during the night. She states she has not been able to sleep well in hospital bed.  Objective: Blood pressure 160/55, pulse 66, temperature 98.2 F (36.8 C), temperature source Oral, resp. rate 0, height 5\' 2"  (1.575 m), weight 188 lb 15 oz (85.7 kg), SpO2 97 %. Patient is sitting in chair and eating breakfast. Abdomen is full but soft and nontender without organomegaly or masses.  No LE edema or clubbing noted.  Labs/studies Results:   Recent Labs  07/12/15 1325 07/13/15 0442 07/14/15 0430  WBC 16.0* 11.2* 10.7*  HGB 10.6* 9.9* 9.5*  HCT 30.0* 29.2* 27.7*  PLT 122* 116* 106*    BMET   Recent Labs  07/11/15 1713 07/12/15 0558  NA 132* 138  K 4.1 4.1  CL 103 111  CO2  --  21*  GLUCOSE 213* 137*  BUN 88* 74*  CREATININE 1.90* 1.32*  CALCIUM  --  8.3*    LFT   Recent Labs  07/12/15 0558  PROT 4.9*  ALBUMIN 2.8*  AST 16  ALT 22  ALKPHOS 36*  BILITOT 2.1*    H. pylori stool antigen is pending.  Assessment:  #1. Upper GI bleed secondary to multiple gastric ulcers. She underwent EGD 2 days ago and no therapy rendered. No evidence of recurrent GI bleed. Patient is back on low-dose aspirin and clopidogrel. #2. Anemia secondary to upper GI bleed. Hemoglobin slowly drifting down most likely due to hemodilution.  #3. Coronary artery disease with recent non-STEMI and stenting.   Recommendations:  Patient transitioned oral pantoprazole at 40 mg by mouth twice a day. Continue sucralfate for 4 weeks. Repeat EGD in 12 weeks.

## 2015-07-15 LAB — BASIC METABOLIC PANEL
Anion gap: 6 (ref 5–15)
BUN: 5 mg/dL — AB (ref 6–20)
CALCIUM: 8.3 mg/dL — AB (ref 8.9–10.3)
CO2: 23 mmol/L (ref 22–32)
Chloride: 112 mmol/L — ABNORMAL HIGH (ref 101–111)
Creatinine, Ser: 0.8 mg/dL (ref 0.44–1.00)
GFR calc Af Amer: >60 mL/min (ref >60)
GLUCOSE: 113 mg/dL — AB (ref 65–99)
Potassium: 3.3 mmol/L — ABNORMAL LOW (ref 3.5–5.1)
Sodium: 141 mmol/L (ref 135–145)

## 2015-07-15 LAB — CBC WITH DIFFERENTIAL/PLATELET
BASOS ABS: 0 10*3/uL (ref 0.0–0.1)
Basophils Relative: 0 %
EOS PCT: 3 %
Eosinophils Absolute: 0.3 10*3/uL (ref 0.0–0.7)
HEMATOCRIT: 27.7 % — AB (ref 36.0–46.0)
Hemoglobin: 9.2 g/dL — ABNORMAL LOW (ref 12.0–15.0)
LYMPHS ABS: 1.6 10*3/uL (ref 0.7–4.0)
LYMPHS PCT: 16 %
MCH: 30.3 pg (ref 26.0–34.0)
MCHC: 33.2 g/dL (ref 30.0–36.0)
MCV: 91.1 fL (ref 78.0–100.0)
MONO ABS: 0.6 10*3/uL (ref 0.1–1.0)
MONOS PCT: 7 %
NEUTROS ABS: 7.1 10*3/uL (ref 1.7–7.7)
Neutrophils Relative %: 74 %
PLATELETS: 114 10*3/uL — AB (ref 150–400)
RBC: 3.04 MIL/uL — ABNORMAL LOW (ref 3.87–5.11)
RDW: 16.4 % — AB (ref 11.5–15.5)
WBC: 9.6 10*3/uL (ref 4.0–10.5)

## 2015-07-15 MED ORDER — PANTOPRAZOLE SODIUM 40 MG PO TBEC: 40.0000 mg | DELAYED_RELEASE_TABLET | Freq: Two times a day (BID) | ORAL | Status: DC

## 2015-07-15 MED ORDER — SUCRALFATE 1 GM/10ML PO SUSP: 1.0000 g | Freq: Three times a day (TID) | ORAL | Status: DC

## 2015-07-15 MED ORDER — NYSTATIN 100000 UNIT/ML MT SUSP: 5.0000 mL | Freq: Four times a day (QID) | OROMUCOSAL | Status: DC

## 2015-07-15 NOTE — Progress Notes (Addendum)
CM spoke with patient and family regarding services and walker.  Information faxed to [employer] spoke with [facility] and information received.  Patient will need her medicaid card to receive  Equipment.  The home health information was faxed to [facility] informed patient discharging from hospital today and will need to start services.  CM followed up with [facility] information received and patient will receive a call on 07/16/15.

## 2015-07-15 NOTE — Discharge Summary (Signed)
Physician Discharge Summary  Patient ID: Christina Boyle MRN: [id number] DOB/AGE: 1955-04-18 60 y.o. Primary Care Physician:Favio Moder L, MD Admit date: 07/11/2015 Discharge date: 07/15/2015    Discharge Diagnoses:   Principal Problem:   Acute upper GI bleed Active Problems:   TOBACCO ABUSE   COPD (chronic obstructive pulmonary disease) (HCC)   Acute blood loss anemia   NSTEMI (non-ST elevated myocardial infarction) (HCC)   Obesity (BMI 30-39.9)   Hx of CABG 2007   AKI (acute kidney injury) ([place])   Upper GI bleeding   Thrombocytopenia (HCC)   Hypovolemia   CKD (chronic kidney disease) stage 3, GFR 30-59 ml/min     Medication List    TAKE these medications        acetaminophen 500 MG tablet  Commonly known as:  TYLENOL  Take 1,000 mg by mouth every 6 (six) hours as needed for moderate pain.     albuterol 108 (90 Base) MCG/ACT inhaler  Commonly known as:  PROVENTIL HFA;VENTOLIN HFA  Inhale 1-2 puffs into the lungs every 6 (six) hours as needed for wheezing or shortness of breath.     amLODipine 5 MG tablet  Commonly known as:  NORVASC  Take 1 tablet (5 mg total) by mouth daily.     aspirin 81 MG EC tablet  Take 1 tablet (81 mg total) by mouth daily.     atorvastatin 80 MG tablet  Commonly known as:  LIPITOR  TAKE ONE TABLET BY MOUTH ONCE DAILY     clopidogrel 75 MG tablet  Commonly known as:  PLAVIX  Take 1 tablet (75 mg total) by mouth daily with breakfast.     furosemide 40 MG tablet  Commonly known as:  LASIX  Take 1 tablet (40 mg total) by mouth 2 (two) times daily.     hydrALAZINE 50 MG tablet  Commonly known as:  APRESOLINE  Take 1 tablet (50 mg total) by mouth every 8 (eight) hours.     isosorbide mononitrate 120 MG 24 hr tablet  Commonly known as:  IMDUR  Take 1 tablet (120 mg total) by mouth daily.     lisinopril 20 MG tablet  Commonly known as:  PRINIVIL,ZESTRIL  Take 1 tablet (20 mg total) by mouth daily.     Metoprolol Tartrate 75 MG  Tabs  Take 75 mg by mouth 2 (two) times daily.     nicotine 21 mg/24hr patch  Commonly known as:  NICODERM CQ  PLACE 1 21 MG PATCH DAILY FOR 6 WEEKS     nicotine 14 mg/24hr patch  Commonly known as:  NICODERM CQ  PLACE 1 14MG  PATCH DAILY FOR 2 WEEKS     nicotine 7 mg/24hr patch  Commonly known as:  NICODERM CQ  PLACE 1 7 MG PATCH DAILY FOR 2 WEEKS     nitroGLYCERIN 0.4 MG SL tablet  Commonly known as:  NITROSTAT  Place 1 tablet (0.4 mg total) under the tongue every 5 (five) minutes as needed for chest pain.     nystatin 100000 UNIT/ML suspension  Commonly known as:  MYCOSTATIN  Take 5 mLs (500,000 Units total) by mouth 4 (four) times daily.     pantoprazole 40 MG tablet  Commonly known as:  PROTONIX  Take 1 tablet (40 mg total) by mouth 2 (two) times daily before a meal.     predniSONE 10 MG tablet  Commonly known as:  DELTASONE  Take 6 tablets (60mg ) 4/25 and 4/26 then 5 tablets (50mg ) 4/27 and  4/28 then 4 tables 4/29 and 4/30 then 3 tablets (30mg ) 5/1 and 5/2 then 2 tablet (20mg ) 5/3 and 5/4 then 1 tablet (10mg ) 5/5 and 5/6 by mouth as described     SPIRIVA RESPIMAT 2.5 MCG/ACT Aers  Generic drug:  Tiotropium Bromide Monohydrate  Inhale 1 puff into the lungs as needed.     sucralfate 1 GM/10ML suspension  Commonly known as:  CARAFATE  Take 10 mLs (1 g total) by mouth 4 (four) times daily -  with meals and at bedtime.        Discharged Condition:Improved    Consults: GI/cardiology  Significant Diagnostic Studies: Dg Chest 2 View  07/01/2015  CLINICAL DATA:  Shortness of breath beginning yesterday. COPD. Coronary artery disease. EXAM: CHEST  2 VIEW COMPARISON:  06/27/2015 FINDINGS: The heart size and mediastinal contours are within normal limits. Both lungs remain clear allowing for ladder radiograph technique. No evidence of pulmonary consolidation or pleural effusion. Prior CABG again noted. The visualized skeletal structures are unremarkable. IMPRESSION: No active  cardiopulmonary disease. Electronically Signed   By: Earle Gell M.D.   On: 07/01/2015 09:39   Dg Chest 2 View  06/27/2015  CLINICAL DATA:  60 year old with productive cough, congestion and shortness of breath for 5 days. History of COPD. EXAM: CHEST  2 VIEW COMPARISON:  03/21/2015. FINDINGS: The heart size and mediastinal contours are stable status post CABG. There is stable mild chronic lung disease with central airway thickening and mild hyperinflation. No evidence of edema, airspace disease, pleural effusion or pneumothorax. The bones appear unchanged. IMPRESSION: Stable postoperative chest.  No acute cardiopulmonary process. Electronically Signed   By: Richardean Sale M.D.   On: 06/27/2015 11:18   Dg Chest Port 1 View  07/11/2015  CLINICAL DATA:  Unresponsive today. EXAM: PORTABLE CHEST 1 VIEW COMPARISON:  July 01, 2015 FINDINGS: The heart size and mediastinal contours are stable. The heart size is mildly enlarged. Both lungs are clear. The visualized skeletal structures are unremarkable. IMPRESSION: No active cardiopulmonary disease. Electronically Signed   By: Abelardo Diesel M.D.   On: 07/11/2015 19:33    Lab Results: Basic Metabolic Panel:  Recent Labs  07/15/15 0550  NA 141  K 3.3*  CL 112*  CO2 23  GLUCOSE 113*  BUN 5*  CREATININE 0.80  CALCIUM 8.3*   Liver Function Tests: No results for input(s): AST, ALT, ALKPHOS, BILITOT, PROT, ALBUMIN in the last 72 hours.   CBC:  Recent Labs  07/13/15 0442 07/14/15 0430 07/15/15 0550  WBC 11.2* 10.7* 9.6  NEUTROABS 7.2  --  7.1  HGB 9.9* 9.5* 9.2*  HCT 29.2* 27.7* 27.7*  MCV 89.0 89.9 91.1  PLT 116* 106* 114*    Recent Results (from the past 240 hour(s))  KOH prep     Status: None   Collection Time: 07/12/15  5:12 PM  Result Value Ref Range Status   Specimen Description ESOPHAGUS BRUSHING  Final   Special Requests NONE  Final   KOH Prep   Final    NO YEAST OR FUNGAL ELEMENTS SEEN Performed at [facility]     Report Status 07/12/2015 FINAL  Final     Hospital Course: This is a 60 year old who had a recent cardiac event as he came to the emergency department with bleeding. She was noted to have a hemoglobin level in the 7 range and received blood transfusion. Her troponin level was positive. She had GI consultation and was noted to have ulcer disease  but nothing that was bleeding at the time. She did not seem to have any further bleeding and did not require any further blood transfusions had consultation with GI and cardiology and eventually improved. Hemoglobin level was 9.2 at the time of discharge. She had no further chest pain abdominal pain or bleeding at the time of discharge  Discharge Exam: Blood pressure 163/34, pulse 63, temperature 98.3 F (36.8 C), temperature source Oral, resp. rate 20, height 5\' 2"  (1.575 m), weight 85.7 kg (188 lb 15 oz), SpO2 97 %. She is awake and alert. She looks comfortable. She is able to ambulate.  Disposition: Discharge with home health services CBC in 2 days      Discharge Instructions    Discharge patient    Complete by:  As directed      Face-to-face encounter (required for Medicare/Medicaid patients)    Complete by:  As directed   I Tevis Dunavan L certify that this patient is under my care and that I, or a nurse practitioner or physician's assistant working with me, had a face-to-face encounter that meets the physician face-to-face encounter requirements with this patient on 07/15/2015. The encounter with the patient was in whole, or in part for the following medical condition(s) which is the primary reason for home health care (List medical condition): GI bleeding  The encounter with the patient was in whole, or in part, for the following medical condition, which is the primary reason for home health care:  GI bleeding  I certify that, based on my findings, the following services are medically necessary home health services:  Nursing  Reason for Medically  Necessary Home Health Services:  Skilled Nursing- Change/Decline in Patient Status  My clinical findings support the need for the above services:  Shortness of breath with activity  Further, I certify that my clinical findings support that this patient is homebound due to:  Shortness of Breath with activity     Home Health    Complete by:  As directed   To provide the following care/treatments:  RN  Please check CBC on 07/17/2015             Signed: Dafne Nield L   07/15/2015, 10:42 AM

## 2015-07-15 NOTE — Progress Notes (Signed)
Patient is doing well and going home today. She still has not provider stool specimen to check for H. pylori antigen. Patient will bring stool sample directly to the lab when she can. Nursing staff advised to give her stool container. Will see her in the office in one month and plan EGD in 12 weeks.

## 2015-07-15 NOTE — Progress Notes (Signed)
Subjective: She feels better. She wants to go home. She's not having any chest pain. No abdominal pain. No bleeding is noted.  Objective: Vital signs in last 24 hours: Temp:  [97.7 F (36.5 C)-98.3 F (36.8 C)] 98.3 F (36.8 C) (05/06 1607) Pulse Rate:  [63-71] 63 (05/06 0608) Resp:  [20] 20 (05/06 0608) BP: (151-163)/(34-42) 163/34 mmHg (05/06 0608) SpO2:  [97 %] 97 % (05/06 3710) Weight change:  Last BM Date: 07/13/15  Intake/Output from previous day: 05/05 0701 - 05/06 0700 In: 6269 [P.O.:240; I.V.:3175] Out: 300 [Urine:300]  PHYSICAL EXAM General appearance: alert, cooperative and no distress Resp: clear to auscultation bilaterally Cardio: regular rate and rhythm, S1, S2 normal, no murmur, click, rub or gallop GI: soft, non-tender; bowel sounds normal; no masses,  no organomegaly Extremities: extremities normal, atraumatic, no cyanosis or edema  Lab Results:  Results for orders placed or performed during the hospital encounter of 07/11/15 (from the past 48 hour(s))  CBC     Status: Abnormal   Collection Time: 07/14/15  4:30 AM  Result Value Ref Range   WBC 10.7 (H) 4.0 - 10.5 K/uL   RBC 3.08 (L) 3.87 - 5.11 MIL/uL   Hemoglobin 9.5 (L) 12.0 - 15.0 g/dL   HCT 27.7 (L) 36.0 - 46.0 %   MCV 89.9 78.0 - 100.0 fL   MCH 30.8 26.0 - 34.0 pg   MCHC 34.3 30.0 - 36.0 g/dL   RDW 16.9 (H) 11.5 - 15.5 %   Platelets 106 (L) 150 - 400 K/uL    Comment: SPECIMEN CHECKED FOR CLOTS CONSISTENT WITH PREVIOUS RESULT   CBC with Differential/Platelet     Status: Abnormal   Collection Time: 07/15/15  5:50 AM  Result Value Ref Range   WBC 9.6 4.0 - 10.5 K/uL   RBC 3.04 (L) 3.87 - 5.11 MIL/uL   Hemoglobin 9.2 (L) 12.0 - 15.0 g/dL   HCT 27.7 (L) 36.0 - 46.0 %   MCV 91.1 78.0 - 100.0 fL   MCH 30.3 26.0 - 34.0 pg   MCHC 33.2 30.0 - 36.0 g/dL   RDW 16.4 (H) 11.5 - 15.5 %   Platelets 114 (L) 150 - 400 K/uL    Comment: SPECIMEN CHECKED FOR CLOTS PLATELET COUNT CONFIRMED BY SMEAR     Neutrophils Relative % 74 %   Neutro Abs 7.1 1.7 - 7.7 K/uL   Lymphocytes Relative 16 %   Lymphs Abs 1.6 0.7 - 4.0 K/uL   Monocytes Relative 7 %   Monocytes Absolute 0.6 0.1 - 1.0 K/uL   Eosinophils Relative 3 %   Eosinophils Absolute 0.3 0.0 - 0.7 K/uL   Basophils Relative 0 %   Basophils Absolute 0.0 0.0 - 0.1 K/uL  Basic metabolic panel     Status: Abnormal   Collection Time: 07/15/15  5:50 AM  Result Value Ref Range   Sodium 141 135 - 145 mmol/L   Potassium 3.3 (L) 3.5 - 5.1 mmol/L   Chloride 112 (H) 101 - 111 mmol/L   CO2 23 22 - 32 mmol/L   Glucose, Bld 113 (H) 65 - 99 mg/dL   BUN 5 (L) 6 - 20 mg/dL   Creatinine, Ser 0.80 0.44 - 1.00 mg/dL   Calcium 8.3 (L) 8.9 - 10.3 mg/dL   GFR calc non Af Amer >60 >60 mL/min   GFR calc Af Amer >60 >60 mL/min    Comment: (NOTE) The eGFR has been calculated using the CKD EPI equation. This calculation has not  been validated in all clinical situations. eGFR's persistently <60 mL/min signify possible Chronic Kidney Disease.    Anion gap 6 5 - 15    ABGS No results for input(s): PHART, PO2ART, TCO2, HCO3 in the last 72 hours.  Invalid input(s): PCO2 CULTURES Recent Results (from the past 240 hour(s))  KOH prep     Status: None   Collection Time: 07/12/15  5:12 PM  Result Value Ref Range Status   Specimen Description ESOPHAGUS BRUSHING  Final   Special Requests NONE  Final   KOH Prep   Final    NO YEAST OR FUNGAL ELEMENTS SEEN Performed at [facility], [place]    Report Status 07/12/2015 FINAL  Final   Studies/Results: No results found.  Medications:  Prior to Admission:  Prescriptions prior to admission  Medication Sig Dispense Refill Last Dose  . acetaminophen (TYLENOL) 500 MG tablet Take 1,000 mg by mouth every 6 (six) hours as needed for moderate pain.   UNKWOWN  . albuterol (PROVENTIL HFA;VENTOLIN HFA) 108 (90 Base) MCG/ACT inhaler Inhale 1-2 puffs into the lungs every 6 (six) hours as needed for wheezing or shortness  of breath.   07/11/2015 at Unknown time  . amLODipine (NORVASC) 5 MG tablet Take 1 tablet (5 mg total) by mouth daily. 30 tablet 6 07/11/2015 at Unknown time  . aspirin EC 81 MG EC tablet Take 1 tablet (81 mg total) by mouth daily.   07/11/2015 at Unknown time  . atorvastatin (LIPITOR) 80 MG tablet TAKE ONE TABLET BY MOUTH ONCE DAILY 90 tablet 3 07/10/2015 at Unknown time  . clopidogrel (PLAVIX) 75 MG tablet Take 1 tablet (75 mg total) by mouth daily with breakfast. 30 tablet 11 07/11/2015 at Unknown time  . furosemide (LASIX) 40 MG tablet Take 1 tablet (40 mg total) by mouth 2 (two) times daily. 60 tablet 3 07/11/2015 at Unknown time  . hydrALAZINE (APRESOLINE) 50 MG tablet Take 1 tablet (50 mg total) by mouth every 8 (eight) hours. 90 tablet 6 07/11/2015 at Unknown time  . isosorbide mononitrate (IMDUR) 120 MG 24 hr tablet Take 1 tablet (120 mg total) by mouth daily. 30 tablet 6 07/11/2015 at Unknown time  . lisinopril (PRINIVIL,ZESTRIL) 20 MG tablet Take 1 tablet (20 mg total) by mouth daily. 30 tablet 6 07/11/2015 at Unknown time  . metoprolol 75 MG TABS Take 75 mg by mouth 2 (two) times daily. 60 tablet 6 07/11/2015 at Unknown time  . nitroGLYCERIN (NITROSTAT) 0.4 MG SL tablet Place 1 tablet (0.4 mg total) under the tongue every 5 (five) minutes as needed for chest pain. 25 tablet 3 UNKNOWN  . predniSONE (DELTASONE) 10 MG tablet Take 6 tablets (12m) 4/25 and 4/26 then 5 tablets (511m 4/27 and 4/28 then 4 tables 4/29 and 4/30 then 3 tablets (3042m5/1 and 5/2 then 2 tablet (23m74m/3 and 5/4 then 1 tablet (10mg95m5 and 5/6 by mouth as described (Patient taking differently: Take 10-60 mg by mouth See admin instructions. Take 6 tablets (60mg)64m5 and 4/26 then 5 tablets (50mg) 23m and 4/28 then 4 tables 4/29 and 4/30 then 3 tablets (30mg) 531mnd 5/2 then 2 tablet (23mg) 5/87md 5/4 then 1 tablet (10mg) 5/566m 5/6 by mouth as described STARTING ON 07/04/15) 32 tablet 0 07/11/2015 at Unknown time  . Tiotropium Bromide  Monohydrate (SPIRIVA RESPIMAT) 2.5 MCG/ACT AERS Inhale 1 puff into the lungs as needed.    UNKNOWN  . nicotine (NICODERM CQ) 14 mg/24hr patch PLACE  1 14MG PATCH DAILY FOR 2 WEEKS 14 patch 0   . nicotine (NICODERM CQ) 21 mg/24hr patch PLACE 1 21 MG PATCH DAILY FOR 6 WEEKS 42 patch 0   . nicotine (NICODERM CQ) 7 mg/24hr patch PLACE 1 7 MG PATCH DAILY FOR 2 WEEKS 14 patch 0    Scheduled: . amLODipine  5 mg Oral Daily  . aspirin EC  81 mg Oral Daily  . atorvastatin  80 mg Oral q1800  . clopidogrel  75 mg Oral Q breakfast  . isosorbide mononitrate  120 mg Oral Daily  . lisinopril  20 mg Oral Daily  . metoprolol  75 mg Oral BID  . nystatin  5 mL Oral QID  . pantoprazole  40 mg Oral BID AC  . pantoprazole (PROTONIX) IV  40 mg Intravenous Q12H  . sodium chloride flush  3 mL Intravenous Q12H  . sucralfate  1 g Oral TID WC & HS   Continuous: . dextrose 5 % and 0.9% NaCl 100 mL/hr at 07/14/15 2300   DHR:CBULAGTXMIWOE **OR** acetaminophen, albuterol, ondansetron **OR** ondansetron (ZOFRAN) IV  Assesment: She was admitted with acute upper GI bleeding and acute blood loss anemia. She had non-STEMI. She had a cardiac event about 2 or 3 weeks prior to this episode. She was hypovolemic on admission. She is generally better. She had acute kidney injury which has improved. Her hemoglobin level is continuing to drift down slightly I think due to hemodilution. She is ready for discharge Principal Problem:   Acute upper GI bleed Active Problems:   TOBACCO ABUSE   COPD (chronic obstructive pulmonary disease) (HCC)   Acute blood loss anemia   NSTEMI (non-ST elevated myocardial infarction) (HCC)   Obesity (BMI 30-39.9)   Hx of CABG 2007   AKI (acute kidney injury) ([place])   Upper GI bleeding   Thrombocytopenia (HCC)   Hypovolemia   CKD (chronic kidney disease) stage 3, GFR 30-59 ml/min    Plan: Discharge home today. She will have home health services repeat CBC in 2 days.    LOS: 4 days    [name],[name] 07/15/2015, 10:35 AM

## 2015-07-17 ENCOUNTER — Encounter (HOSPITAL_COMMUNITY): Payer: Self-pay | Admitting: Internal Medicine

## 2015-07-20 LAB — H.PYLORI ANTIGEN, STOOL: H. pylori ag, stool: DETECTED

## 2015-07-26 ENCOUNTER — Encounter: Payer: Self-pay | Admitting: Pulmonary Disease

## 2015-08-01 ENCOUNTER — Encounter (INDEPENDENT_AMBULATORY_CARE_PROVIDER_SITE_OTHER): Payer: Self-pay | Admitting: *Deleted

## 2015-08-22 ENCOUNTER — Telehealth (INDEPENDENT_AMBULATORY_CARE_PROVIDER_SITE_OTHER): Payer: Self-pay | Admitting: *Deleted

## 2015-08-22 ENCOUNTER — Ambulatory Visit (INDEPENDENT_AMBULATORY_CARE_PROVIDER_SITE_OTHER): Payer: Medicaid Other | Admitting: Internal Medicine

## 2015-08-22 ENCOUNTER — Encounter (INDEPENDENT_AMBULATORY_CARE_PROVIDER_SITE_OTHER): Payer: Self-pay | Admitting: Internal Medicine

## 2015-08-22 ENCOUNTER — Encounter (INDEPENDENT_AMBULATORY_CARE_PROVIDER_SITE_OTHER): Payer: Self-pay | Admitting: *Deleted

## 2015-08-22 ENCOUNTER — Other Ambulatory Visit (INDEPENDENT_AMBULATORY_CARE_PROVIDER_SITE_OTHER): Payer: Self-pay | Admitting: Internal Medicine

## 2015-08-22 VITALS — BP 164/60 | HR 76 | Temp 98.2°F | Ht 62.0 in | Wt 181.2 lb

## 2015-08-22 DIAGNOSIS — K922 Gastrointestinal hemorrhage, unspecified: Secondary | ICD-10-CM

## 2015-08-22 LAB — CBC WITH DIFFERENTIAL/PLATELET
BASOS ABS: 86 {cells}/uL (ref 0–200)
BASOS PCT: 1 %
EOS ABS: 172 {cells}/uL (ref 15–500)
Eosinophils Relative: 2 %
HEMATOCRIT: 32.4 % — AB (ref 35.0–45.0)
Hemoglobin: 10.5 g/dL — ABNORMAL LOW (ref 11.7–15.5)
LYMPHS PCT: 26 %
Lymphs Abs: 2236 cells/uL (ref 850–3900)
MCH: 28.8 pg (ref 27.0–33.0)
MCHC: 32.4 g/dL (ref 32.0–36.0)
MCV: 89 fL (ref 80.0–100.0)
MONO ABS: 1032 {cells}/uL — AB (ref 200–950)
MPV: 11 fL (ref 7.5–12.5)
Monocytes Relative: 12 %
NEUTROS PCT: 59 %
Neutro Abs: 5074 cells/uL (ref 1500–7800)
Platelets: 237 10*3/uL (ref 140–400)
RBC: 3.64 MIL/uL — ABNORMAL LOW (ref 3.80–5.10)
RDW: 15.3 % — AB (ref 11.0–15.0)
WBC: 8.6 10*3/uL (ref 3.8–10.8)

## 2015-08-22 NOTE — Telephone Encounter (Signed)
Patient is scheduled for endoscopy on 10/18/15 and needs to stop Plavix 5 days and ASA 2 days prior, please advise if this is ok, thanks

## 2015-08-22 NOTE — Progress Notes (Signed)
Subjective:    Patient ID: Christina Boyle, female    DOB: 1955-09-24, 60 y.o.   MRN: [id number]  HPI   Recently admitted to [facility] for UGI bleed in May. Noted on admission to hae a hemoglobin of 8.1. Also noted to have tarry stools and was guaiac positive in the ED.  Patient does have a cardiac hx and had a coronary artery bypass graft in 2007.   She also suffered an MI in May and underwent stenting of ostial SVG to OM.  She was hospitalized last year at [facility] with melena and anemia. She received 2 units of PRBCs. EGD by Dr. [name] revealed 2 gastric ulcers.  H. pylori was negative. She was advised to follow up but she never did.  She is on low dose ASA Plavix. She does not take OTC NSAIDs.   07/15/2015 Hemoglobin 9.2 She tells me she is doing okay. Her blood pressure is up and down. She denies having any black stools.  Appetite is good. No weight loss. No abdominal pain.  Hx significant for recent MI and aortic stenosis and maintained on Plavix and ASA Quit smoking in May after smoking since age 55. Smoked 1 pack a day.   CBC    Component Value Date/Time   WBC 9.6 07/15/2015 0550   RBC 3.04* 07/15/2015 0550   RBC 2.22* 07/21/2014 0740   HGB 9.2* 07/15/2015 0550   HCT 27.7* 07/15/2015 0550   PLT 114* 07/15/2015 0550   MCV 91.1 07/15/2015 0550   MCH 30.3 07/15/2015 0550   MCHC 33.2 07/15/2015 0550   RDW 16.4* 07/15/2015 0550   LYMPHSABS 1.6 07/15/2015 0550   MONOABS 0.6 07/15/2015 0550   EOSABS 0.3 07/15/2015 0550   BASOSABS 0.0 07/15/2015 0550          07/12/2015    Upper GI endoscopy Indications: Acute post hemorrhagic anemia,history of gastric ulcers diagnosed in May 2016.   Biopsy revealed benign etiology and H. Pylori was negative .   Impression:               - Normal proximal esophagus and mid esophagus.                           - Plaques in the lower third of the esophagus.                             Cells for cytology obtained.                           -  Non-bleeding gastric ulcers with no stigmata of                             bleeding.                           - Non-bleeding gastric ulcer with pigmented                             material.                           - Duodenal erosions without bleeding.                           -  Duodenal erosions without bleeding.                      Review of Systems Past Medical History  Diagnosis Date  . CAD (coronary artery disease)     Multivessel disease s/p CABG, DES SVG to OM  July 03 2015 (occluded SVG to PDA)  . Chronic bronchitis   . Coarctation of aorta   . Hyperlipidemia   . Essential hypertension   . COPD (chronic obstructive pulmonary disease) ([place])   . Depression   . GERD (gastroesophageal reflux disease)   . Cervical cancer ([place])   . Obesity (BMI 30-39.9)   . NSTEMI (non-ST elevated myocardial infarction) [facility])     April 2017    Past Surgical History  Procedure Laterality Date  . Breast biopsy Left     Benign  . Cholecystectomy    . Coronary artery bypass graft  02/2006    LIMA to LAD, SVG to OM, SVG to PDA  . Cervical cone biopsy    . Esophagogastroduodenoscopy N/A 07/22/2014    Procedure: ESOPHAGOGASTRODUODENOSCOPY (EGD);  Surgeon: [name], MD;  Location: [facility] ENDOSCOPY;  Service: Endoscopy;  Laterality: N/A;  . Cardiac catheterization N/A 06/30/2015    Procedure: Left Heart Cath and Cors/Grafts Angiography;  Surgeon: Leonie Man, MD;  Location: [place] CV LAB;  Service: Cardiovascular;  Laterality: N/A;  . Cardiac catheterization N/A 07/03/2015    Procedure: Coronary Stent Intervention;  Surgeon: Peter M Martinique, MD;  Location: [place] CV LAB;  Service: Cardiovascular;  Laterality: N/A;  . Esophagogastroduodenoscopy N/A 07/12/2015    Procedure: ESOPHAGOGASTRODUODENOSCOPY (EGD);  Surgeon: Rogene Houston, MD;  Location: [facility] ENDO SUITE;  Service: Endoscopy;  Laterality: N/A;    No Known Allergies  Current Outpatient Prescriptions on File Prior to Visit    Medication Sig Dispense Refill  . acetaminophen (TYLENOL) 500 MG tablet Take 1,000 mg by mouth every 6 (six) hours as needed for moderate pain.    Marland Kitchen albuterol (PROVENTIL HFA;VENTOLIN HFA) 108 (90 Base) MCG/ACT inhaler Inhale 1-2 puffs into the lungs every 6 (six) hours as needed for wheezing or shortness of breath.    Marland Kitchen amLODipine (NORVASC) 5 MG tablet Take 1 tablet (5 mg total) by mouth daily. 30 tablet 6  . aspirin EC 81 MG EC tablet Take 1 tablet (81 mg total) by mouth daily.    Marland Kitchen atorvastatin (LIPITOR) 80 MG tablet TAKE ONE TABLET BY MOUTH ONCE DAILY 90 tablet 3  . clopidogrel (PLAVIX) 75 MG tablet Take 1 tablet (75 mg total) by mouth daily with breakfast. 30 tablet 11  . furosemide (LASIX) 40 MG tablet Take 1 tablet (40 mg total) by mouth 2 (two) times daily. 60 tablet 3  . hydrALAZINE (APRESOLINE) 50 MG tablet Take 1 tablet (50 mg total) by mouth every 8 (eight) hours. 90 tablet 6  . isosorbide mononitrate (IMDUR) 120 MG 24 hr tablet Take 1 tablet (120 mg total) by mouth daily. 30 tablet 6  . lisinopril (PRINIVIL,ZESTRIL) 20 MG tablet Take 1 tablet (20 mg total) by mouth daily. 30 tablet 6  . metoprolol 75 MG TABS Take 75 mg by mouth 2 (two) times daily. 60 tablet 6  . nitroGLYCERIN (NITROSTAT) 0.4 MG SL tablet Place 1 tablet (0.4 mg total) under the tongue every 5 (five) minutes as needed for chest pain. 25 tablet 3  . pantoprazole (PROTONIX) 40 MG tablet Take 1 tablet (40 mg total) by mouth 2 (two) times  daily before a meal. 60 tablet 12  . Tiotropium Bromide Monohydrate (SPIRIVA RESPIMAT) 2.5 MCG/ACT AERS Inhale 1 puff into the lungs as needed.      No current facility-administered medications on file prior to visit.        Objective:   Physical Exam Blood pressure 164/60, pulse 76, temperature 98.2 F (36.8 C), height 5\' 2"  (1.575 m), weight 181 lb 3.2 oz (82.192 kg). Alert and oriented. Skin warm and dry. Oral mucosa is moist.   . Sclera anicteric, conjunctivae is pink. Thyroid  not enlarged. No cervical lymphadenopathy. Lungs clear. Heart regular rate and rhythm.  Abdomen is soft. Bowel sounds are positive. No hepatomegaly. No abdominal masses felt. No tenderness.  No edema to lower extremities.          Assessment & Plan:  UGIB. Will get a CBC today.  Repeat EGD 12 weeks from 07/15/2015

## 2015-08-22 NOTE — Patient Instructions (Signed)
EGD. The risks and benefits such as perforation, bleeding, and infection were reviewed with the patient and is agreeable. 

## 2015-08-31 IMAGING — CR DG CHEST 1V PORT
1 series · 1 of 1 positions shown · non-contrast
Comparison: 05/02/2008

CLINICAL DATA: Nausea, diarrhea, and shortness of breath tonight

EXAM:
PORTABLE CHEST - 1 VIEW

[AP]
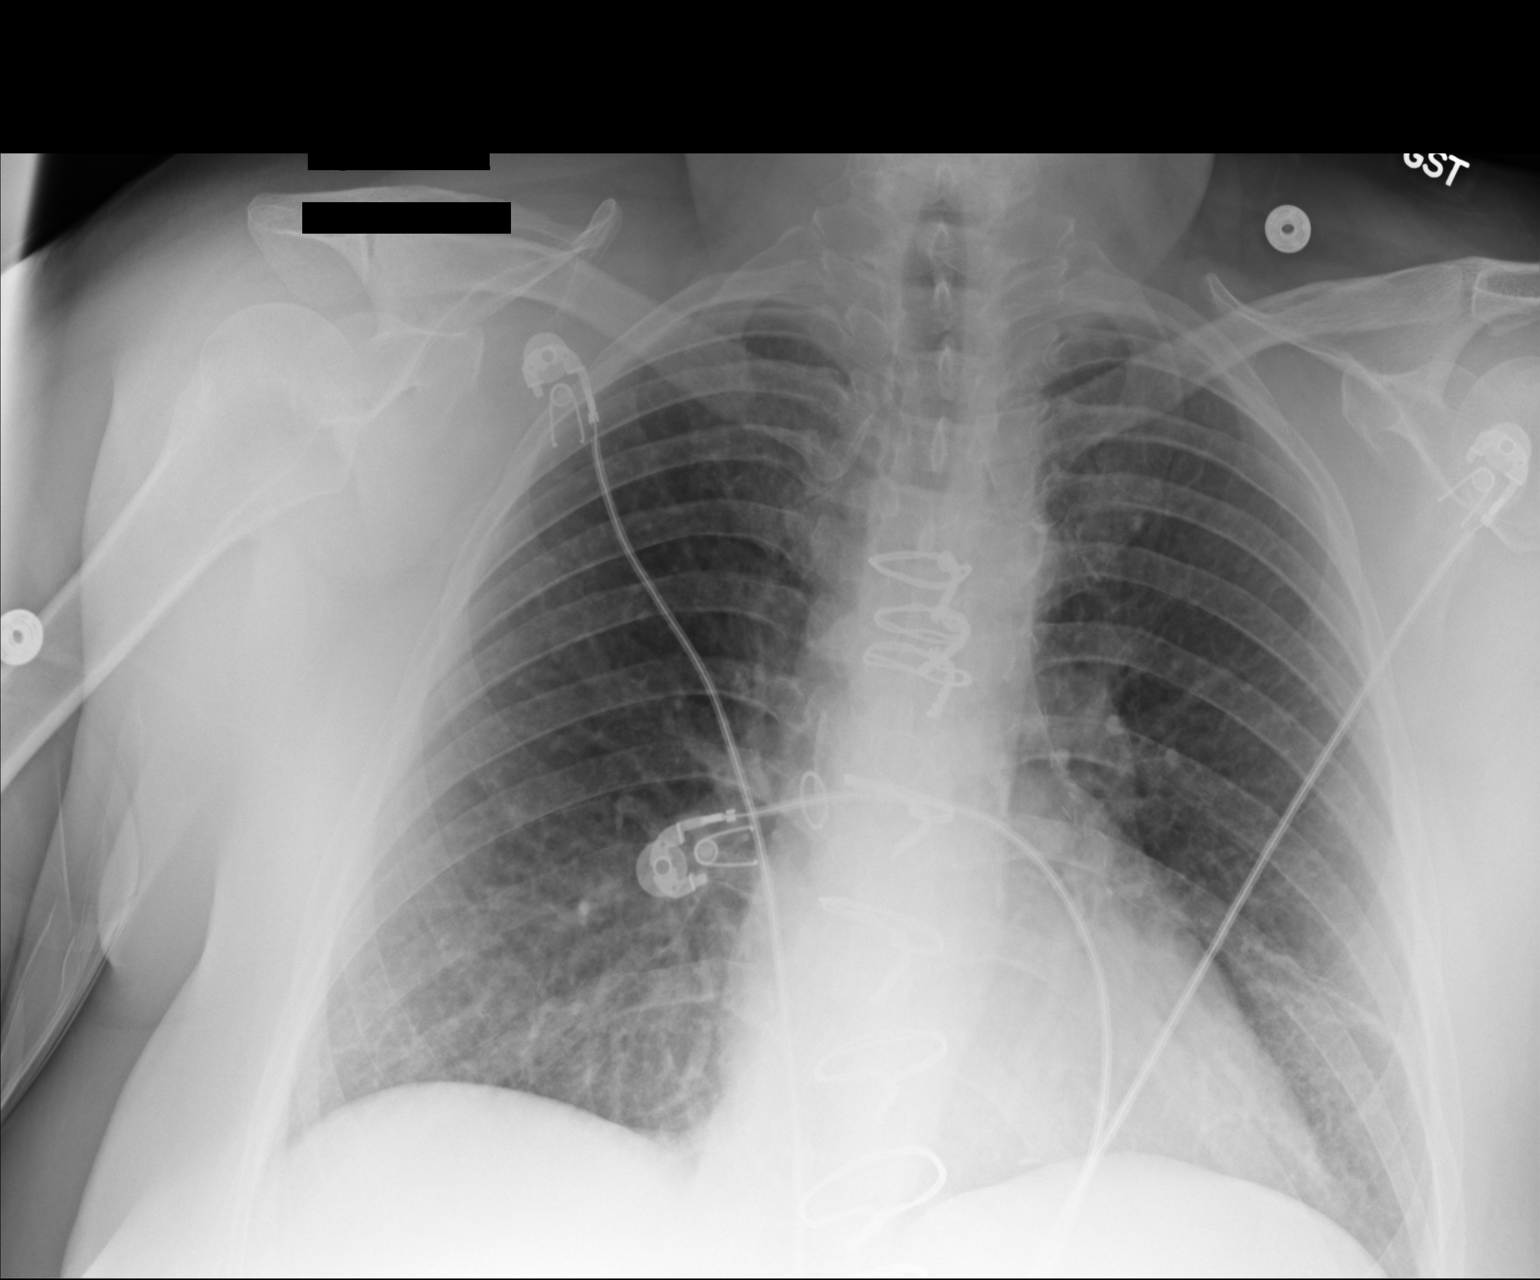

[1 of 1 positions shown; findings below may reference images not displayed]

FINDINGS: Postoperative changes in the mediastinum. Normal heart size and
pulmonary vascularity. No focal airspace disease or consolidation.
No blunting of costophrenic angles. No pneumothorax. Mild scarring
in the left mid lung. Calcification of the aorta. No change since
prior study.
IMPRESSION: No active disease.

## 2015-09-15 NOTE — Telephone Encounter (Signed)
She had a recent stent in 06/2015. Whether or not to hold her aspirin and plavix depends on the urgency of the procedure. The recommended is waiting 1 year, waitings 6 months is reasonable, 3 months can be considered if strong indication for test. How urgent is her test, is she still bleeding?   Zandra Abts MD

## 2015-09-18 NOTE — Telephone Encounter (Signed)
Ann, you probably need to let Dr Laural Golden look at this. This is his call.

## 2015-09-18 NOTE — Telephone Encounter (Signed)
Dr Laural Golden, please review Dr Nelly Laurence recommendation about blood thinner & procedure and please advise as to what we need do to. Thanks, Tamberly Pomplun

## 2015-09-18 NOTE — Telephone Encounter (Signed)
Christina Boyle please read Dr Harl Bowie recommendation and advise

## 2015-09-19 NOTE — Telephone Encounter (Signed)
Will proceed with EGD without biopsy. Patient will continue antiplatelet agents.

## 2015-09-21 NOTE — Telephone Encounter (Signed)
Patient aware no stop blood thinner or ASA prior to procedure

## 2015-10-18 ENCOUNTER — Encounter (HOSPITAL_COMMUNITY)
Admission: RE | Disposition: A | Discharge status: Home / Self Care | Payer: Self-pay | Source: Ambulatory Visit | Attending: Internal Medicine

## 2015-10-18 ENCOUNTER — Encounter (HOSPITAL_COMMUNITY): Payer: Self-pay | Admitting: *Deleted

## 2015-10-18 ENCOUNTER — Ambulatory Visit (HOSPITAL_COMMUNITY)
Admission: RE | Admit: 2015-10-18 | Discharge: 2015-10-18 | Disposition: A | Discharge status: Home / Self Care | Payer: Medicaid Other | Source: Ambulatory Visit | Attending: Internal Medicine | Admitting: Internal Medicine

## 2015-10-18 DIAGNOSIS — Z79899 Other long term (current) drug therapy: Secondary | ICD-10-CM | POA: Insufficient documentation

## 2015-10-18 DIAGNOSIS — I10 Essential (primary) hypertension: Secondary | ICD-10-CM | POA: Diagnosis not present

## 2015-10-18 DIAGNOSIS — J449 Chronic obstructive pulmonary disease, unspecified: Secondary | ICD-10-CM | POA: Diagnosis not present

## 2015-10-18 DIAGNOSIS — R011 Cardiac murmur, unspecified: Secondary | ICD-10-CM | POA: Diagnosis not present

## 2015-10-18 DIAGNOSIS — K219 Gastro-esophageal reflux disease without esophagitis: Secondary | ICD-10-CM | POA: Insufficient documentation

## 2015-10-18 DIAGNOSIS — E669 Obesity, unspecified: Secondary | ICD-10-CM | POA: Diagnosis not present

## 2015-10-18 DIAGNOSIS — K3189 Other diseases of stomach and duodenum: Secondary | ICD-10-CM

## 2015-10-18 DIAGNOSIS — Z8541 Personal history of malignant neoplasm of cervix uteri: Secondary | ICD-10-CM | POA: Insufficient documentation

## 2015-10-18 DIAGNOSIS — K257 Chronic gastric ulcer without hemorrhage or perforation: Secondary | ICD-10-CM | POA: Diagnosis not present

## 2015-10-18 DIAGNOSIS — K922 Gastrointestinal hemorrhage, unspecified: Secondary | ICD-10-CM

## 2015-10-18 DIAGNOSIS — Z6830 Body mass index (BMI) 30.0-30.9, adult: Secondary | ICD-10-CM | POA: Diagnosis not present

## 2015-10-18 DIAGNOSIS — Z7982 Long term (current) use of aspirin: Secondary | ICD-10-CM | POA: Insufficient documentation

## 2015-10-18 DIAGNOSIS — I2510 Atherosclerotic heart disease of native coronary artery without angina pectoris: Secondary | ICD-10-CM | POA: Diagnosis not present

## 2015-10-18 DIAGNOSIS — E785 Hyperlipidemia, unspecified: Secondary | ICD-10-CM | POA: Diagnosis not present

## 2015-10-18 DIAGNOSIS — F17210 Nicotine dependence, cigarettes, uncomplicated: Secondary | ICD-10-CM | POA: Insufficient documentation

## 2015-10-18 DIAGNOSIS — K2970 Gastritis, unspecified, without bleeding: Secondary | ICD-10-CM | POA: Diagnosis not present

## 2015-10-18 DIAGNOSIS — F329 Major depressive disorder, single episode, unspecified: Secondary | ICD-10-CM | POA: Diagnosis not present

## 2015-10-18 DIAGNOSIS — Z8711 Personal history of peptic ulcer disease: Secondary | ICD-10-CM | POA: Diagnosis present

## 2015-10-18 DIAGNOSIS — I252 Old myocardial infarction: Secondary | ICD-10-CM | POA: Diagnosis not present

## 2015-10-18 DIAGNOSIS — Z951 Presence of aortocoronary bypass graft: Secondary | ICD-10-CM | POA: Insufficient documentation

## 2015-10-18 HISTORY — DX: Cardiac murmur, unspecified: R01.1

## 2015-10-18 HISTORY — PX: ESOPHAGOGASTRODUODENOSCOPY: SHX5428

## 2015-10-18 LAB — HEMOGLOBIN AND HEMATOCRIT, BLOOD
HEMATOCRIT: 36.6 % (ref 36.0–46.0)
Hemoglobin: 12.1 g/dL (ref 12.0–15.0)

## 2015-10-18 SURGERY — EGD (ESOPHAGOGASTRODUODENOSCOPY)
Anesthesia: Moderate Sedation

## 2015-10-18 MED ORDER — SODIUM CHLORIDE 0.9 % IV SOLN
INTRAVENOUS | Status: DC
  Administered 2015-10-18: 12:00:00 via INTRAVENOUS

## 2015-10-18 MED ORDER — BUTAMBEN-TETRACAINE-BENZOCAINE 2-2-14 % EX AERO
INHALATION_SPRAY | CUTANEOUS | Status: DC | PRN
  Administered 2015-10-18: 2 via TOPICAL

## 2015-10-18 MED ORDER — BIS SUBCIT-METRONID-TETRACYC 140-125-125 MG PO CAPS: 3.0000 | ORAL_CAPSULE | Freq: Three times a day (TID) | ORAL | 0 refills | Status: DC

## 2015-10-18 MED ORDER — PANTOPRAZOLE SODIUM 40 MG PO TBEC: 40.0000 mg | DELAYED_RELEASE_TABLET | Freq: Every day | ORAL | 11 refills | Status: DC

## 2015-10-18 MED ORDER — MIDAZOLAM HCL 5 MG/5ML IJ SOLN
INTRAMUSCULAR | Status: AC
  Filled 2015-10-18: qty 10

## 2015-10-18 MED ORDER — STERILE WATER FOR IRRIGATION IR SOLN
Status: DC | PRN
  Administered 2015-10-18: 2.5 mL

## 2015-10-18 MED ORDER — MEPERIDINE HCL 50 MG/ML IJ SOLN
INTRAMUSCULAR | Status: DC | PRN
  Administered 2015-10-18 (×2): 25 mg via INTRAVENOUS

## 2015-10-18 MED ORDER — MEPERIDINE HCL 50 MG/ML IJ SOLN
INTRAMUSCULAR | Status: AC
  Filled 2015-10-18: qty 1

## 2015-10-18 MED ORDER — MIDAZOLAM HCL 5 MG/5ML IJ SOLN
INTRAMUSCULAR | Status: DC | PRN
  Administered 2015-10-18 (×5): 2 mg via INTRAVENOUS

## 2015-10-18 NOTE — Discharge Instructions (Signed)
After 10 days decrease pantoprazole dose to 40 mg by mouth 30 minutes before breakfast daily. Resume other medications as before. Pylera 3 capsules by mouth after each meal and at bedtime for total of 10 days. This medication may turn your stool black. Please don't be alarmed. Resume usual diet. No driving for 24 hours.  Gastrointestinal Endoscopy, Care After Refer to this sheet in the next few weeks. These instructions provide you with information on caring for yourself after your procedure. Your caregiver may also give you more specific instructions. Your treatment has been planned according to current medical practices, but problems sometimes occur. Call your caregiver if you have any problems or questions after your procedure. HOME CARE INSTRUCTIONS  If you were given medicine to help you relax (sedative), do not drive, operate machinery, or sign important documents for 24 hours.  Avoid alcohol and hot or warm beverages for the first 24 hours after the procedure.  Only take over-the-counter or prescription medicines for pain, discomfort, or fever as directed by your caregiver. You may resume taking your normal medicines unless your caregiver tells you otherwise. Ask your caregiver when you may resume taking medicines that may cause bleeding, such as aspirin, clopidogrel, or warfarin.  You may return to your normal diet and activities on the day after your procedure, or as directed by your caregiver. Walking may help to reduce any bloated feeling in your abdomen.  Drink enough fluids to keep your urine clear or pale yellow.  You may gargle with salt water if you have a sore throat. SEEK IMMEDIATE MEDICAL CARE IF:  You have severe nausea or vomiting.  You have severe abdominal pain, abdominal cramps that last longer than 6 hours, or abdominal swelling (distention).  You have severe shoulder or back pain.  You have trouble swallowing.  You have shortness of breath, your breathing is  shallow, or you are breathing faster than normal.  You have a fever or a rapid heartbeat.  You vomit blood or material that looks like coffee grounds.  You have bloody, black, or tarry stools. MAKE SURE YOU:  Understand these instructions.  Will watch your condition.  Will get help right away if you are not doing well or get worse.   This information is not intended to replace advice given to you by your health care provider. Make sure you discuss any questions you have with your health care provider.   Document Released: 10/10/2003 Document Revised: 03/18/2014 Document Reviewed: 05/28/2011 Elsevier Interactive Patient Education 2016 [place].       Gastroesophageal Reflux Disease, Adult Normally, food travels down the esophagus and stays in the stomach to be digested. However, when a person has gastroesophageal reflux disease (GERD), food and stomach acid move back up into the esophagus. When this happens, the esophagus becomes sore and inflamed. Over time, GERD can create small holes (ulcers) in the lining of the esophagus.  CAUSES This condition is caused by a problem with the muscle between the esophagus and the stomach (lower esophageal sphincter, or LES). Normally, the LES muscle closes after food passes through the esophagus to the stomach. When the LES is weakened or abnormal, it does not close properly, and that allows food and stomach acid to go back up into the esophagus. The LES can be weakened by certain dietary substances, medicines, and medical conditions, including:  Tobacco use.  Pregnancy.  Having a hiatal hernia.  Heavy alcohol use.  Certain foods and beverages, such as coffee, chocolate, onions,  and peppermint. RISK FACTORS This condition is more likely to develop in:  People who have an increased body weight.  People who have connective tissue disorders.  People who use NSAID medicines. SYMPTOMS Symptoms of this condition  include:  Heartburn.  Difficult or painful swallowing.  The feeling of having a lump in the throat.  Abitter taste in the mouth.  Bad breath.  Having a large amount of saliva.  Having an upset or bloated stomach.  Belching.  Chest pain.  Shortness of breath or wheezing.  Ongoing (chronic) cough or a night-time cough.  Wearing away of tooth enamel.  Weight loss. Different conditions can cause chest pain. Make sure to see your health care provider if you experience chest pain. DIAGNOSIS Your health care provider will take a medical history and perform a physical exam. To determine if you have mild or severe GERD, your health care provider may also monitor how you respond to treatment. You may also have other tests, including:  An endoscopy toexamine your stomach and esophagus with a small camera.  A test thatmeasures the acidity level in your esophagus.  A test thatmeasures how much pressure is on your esophagus.  A barium swallow or modified barium swallow to show the shape, size, and functioning of your esophagus. TREATMENT The goal of treatment is to help relieve your symptoms and to prevent complications. Treatment for this condition may vary depending on how severe your symptoms are. Your health care provider may recommend:  Changes to your diet.  Medicine.  Surgery. HOME CARE INSTRUCTIONS Diet  Follow a diet as recommended by your health care provider. This may involve avoiding foods and drinks such as:  Coffee and tea (with or without caffeine).  Drinks that containalcohol.  Energy drinks and sports drinks.  Carbonated drinks or sodas.  Chocolate and cocoa.  Peppermint and mint flavorings.  Garlic and onions.  Horseradish.  Spicy and acidic foods, including peppers, chili powder, curry powder, vinegar, hot sauces, and barbecue sauce.  Citrus fruit juices and citrus fruits, such as oranges, lemons, and limes.  Tomato-based foods, such as  red sauce, chili, salsa, and pizza with red sauce.  Fried and fatty foods, such as donuts, french fries, potato chips, and high-fat dressings.  High-fat meats, such as hot dogs and fatty cuts of red and white meats, such as rib eye steak, sausage, ham, and bacon.  High-fat dairy items, such as whole milk, butter, and cream cheese.  Eat small, frequent meals instead of large meals.  Avoid drinking large amounts of liquid with your meals.  Avoid eating meals during the 2-3 hours before bedtime.  Avoid lying down right after you eat.  Do not exercise right after you eat. General Instructions  Pay attention to any changes in your symptoms.  Take over-the-counter and prescription medicines only as told by your health care provider. Do not take aspirin, ibuprofen, or other NSAIDs unless your health care provider told you to do so.  Do not use any tobacco products, including cigarettes, chewing tobacco, and e-cigarettes. If you need help quitting, ask your health care provider.  Wear loose-fitting clothing. Do not wear anything tight around your waist that causes pressure on your abdomen.  Raise (elevate) the head of your bed 6 inches (15cm).  Try to reduce your stress, such as with yoga or meditation. If you need help reducing stress, ask your health care provider.  If you are overweight, reduce your weight to an amount that is healthy for  you. Ask your health care provider for guidance about a safe weight loss goal.  Keep all follow-up visits as told by your health care provider. This is important. SEEK MEDICAL CARE IF:  You have new symptoms.  You have unexplained weight loss.  You have difficulty swallowing, or it hurts to swallow.  You have wheezing or a persistent cough.  Your symptoms do not improve with treatment.  You have a hoarse voice. SEEK IMMEDIATE MEDICAL CARE IF:  You have pain in your arms, neck, jaw, teeth, or back.  You feel sweaty, dizzy, or  light-headed.  You have chest pain or shortness of breath.  You vomit and your vomit looks like blood or coffee grounds.  You faint.  Your stool is bloody or black.  You cannot swallow, drink, or eat.   This information is not intended to replace advice given to you by your health care provider. Make sure you discuss any questions you have with your health care provider.   Document Released: 12/05/2004 Document Revised: 11/16/2014 Document Reviewed: 06/22/2014 Elsevier Interactive Patient Education [employer].

## 2015-10-18 NOTE — Op Note (Signed)
[facility] Patient Name: Christina Boyle Procedure Date: 10/18/2015 12:48 PM MRN: [id number] Date of Birth: April 05, 1955 Attending MD: Hildred Laser , MD CSN: [id number] Age: 60 Admit Type: Outpatient Procedure:                Upper GI endoscopy Indications:              Follow-up of chronic gastric ulcer Providers:                Hildred Laser, MD, Lurline Del, RN, Isabella Stalling,                            Technician Referring MD:             Jasper Loser. Luan Pulling, MD Medicines:                Cetacaine spray, Meperidine 50 mg IV, Midazolam 10                            mg IV Complications:            No immediate complications. Estimated Blood Loss:     Estimated blood loss: none. Procedure:                Pre-Anesthesia Assessment:                           - Prior to the procedure, a History and Physical                            was performed, and patient medications and                            allergies were reviewed. The patient's tolerance of                            previous anesthesia was also reviewed. The risks                            and benefits of the procedure and the sedation                            options and risks were discussed with the patient.                            All questions were answered, and informed consent                            was obtained. Prior Anticoagulants: The patient                            last took aspirin on the day of the procedure and                            Plavix (clopidogrel) on the day of the procedure.  ASA Grade Assessment: III - A patient with severe                            systemic disease. After reviewing the risks and                            benefits, the patient was deemed in satisfactory                            condition to undergo the procedure.                           After obtaining informed consent, the endoscope was                            passed under  direct vision. Throughout the                            procedure, the patient's blood pressure, pulse, and                            oxygen saturations were monitored continuously. The                            [id number]) scope was introduced through the                            mouth, and advanced to the second part of duodenum.                            The upper GI endoscopy was accomplished without                            difficulty. The patient tolerated the procedure                            well. Scope In: 1:06:40 PM Scope Out: 1:12:05 PM Total Procedure Duration: 0 hours 5 minutes 25 seconds  Findings:      The examined esophagus was normal.      The Z-line was regular and was found 36 cm from the incisors.      Localized minimal inflammation characterized by congestion (edema) and       erythema was found in the gastric antrum.      A healed ulcer was found in the gastric antrum. Adjacent mucosal       findings include edema and erythema.      The exam of the stomach was otherwise normal.      The duodenal bulb and second portion of the duodenum were normal. Impression:               - Normal esophagus.                           - Z-line regular, 36 cm from the incisors.                           -  Gastritis.                           - Scar in the gastric antrum.                           - Normal duodenal bulb and second portion of the                            duodenum.                           - No specimens collected. Moderate Sedation:      Moderate (conscious) sedation was administered by the endoscopy nurse       and supervised by the endoscopist. The following parameters were       monitored: oxygen saturation, heart rate, blood pressure, CO2       capnography and response to care. Total physician intraservice time was       15 minutes. Recommendation:           - Patient has a contact number available for                             emergencies. The signs and symptoms of potential                            delayed complications were discussed with the                            patient. Return to normal activities tomorrow.                            Written discharge instructions were provided to the                            patient.                           - Resume previous diet today.                           - Continue present medications.                           - Use Pylera 3 capsules QID for 10 days.. Procedure Code(s):        --- Professional ---                           [phone], Esophagogastroduodenoscopy, flexible,                            transoral; diagnostic, including collection of                            specimen(s) by brushing or washing, when performed                            (  separate procedure)                           99152, Moderate sedation services provided by the                            same physician or other qualified health care                            professional performing the diagnostic or                            therapeutic service that the sedation supports,                            requiring the presence of an independent trained                            observer to assist in the monitoring of the                            patient's level of consciousness and physiological                            status; initial 15 minutes of intraservice time,                            patient age 48 years or older Diagnosis Code(s):        --- Professional ---                           K29.70, Gastritis, unspecified, without bleeding                           K31.89, Other diseases of stomach and duodenum                           K25.7, Chronic gastric ulcer without hemorrhage or                            perforation CPT copyright 2016 American Medical Association. All rights reserved. The codes documented in this report are preliminary and upon coder review may   be revised to meet current compliance requirements. Hildred Laser, MD Hildred Laser, MD 10/18/2015 1:29:26 PM This report has been signed electronically. Number of Addenda: 0

## 2015-10-18 NOTE — H&P (Addendum)
Christina Boyle is an 60 y.o. female.   Chief Complaint: Patient is here for EGD. HPI: 60 year old Caucasian female with multiple medical problems who was admitted 3 months ago with upper GI bleed secondary to peptic ulcer disease. She has history of gastric ulcers initially diagnosed in May 2016 but never documented to have healed. She has known CAD and has drug-eluting stent and therefore cannot come off of Plavix and aspirin. She denies heartburn nausea vomiting abdominal pain. She also denies melena. Stool H. pylori antigen came back positive but she has not been treated yet. Her H&H from this afternoon is pending.  Past Medical History:  Diagnosis Date  . CAD (coronary artery disease)    Multivessel disease s/p CABG, DES SVG to OM  July 03 2015 (occluded SVG to PDA)  . Cervical cancer ([place])   . Chronic bronchitis   . Coarctation of aorta   . COPD (chronic obstructive pulmonary disease) ([place])   . Depression   . Essential hypertension   . GERD (gastroesophageal reflux disease)   . Heart murmur   . Hyperlipidemia   . NSTEMI (non-ST elevated myocardial infarction) [facility])    April 2017  . Obesity (BMI 30-39.9)     Past Surgical History:  Procedure Laterality Date  . BREAST BIOPSY Left    Benign  . CARDIAC CATHETERIZATION N/A 06/30/2015   Procedure: Left Heart Cath and Cors/Grafts Angiography;  Surgeon: Leonie Man, MD;  Location: [place] [facility];  Service: Cardiovascular;  Laterality: N/A;  . CARDIAC CATHETERIZATION N/A 07/03/2015   Procedure: Coronary Stent Intervention;  Surgeon: Peter M Martinique, MD;  Location: [place] [facility];  Service: Cardiovascular;  Laterality: N/A;  . CERVICAL CONE BIOPSY    . CHOLECYSTECTOMY    . CORONARY ARTERY BYPASS GRAFT  02/2006   LIMA to LAD, SVG to OM, SVG to PDA  . ESOPHAGOGASTRODUODENOSCOPY N/A 07/22/2014   Procedure: ESOPHAGOGASTRODUODENOSCOPY (EGD);  Surgeon: [name], MD;  Location: [facility];  Service: Endoscopy;   Laterality: N/A;  . ESOPHAGOGASTRODUODENOSCOPY N/A 07/12/2015   Procedure: ESOPHAGOGASTRODUODENOSCOPY (EGD);  Surgeon: Rogene Houston, MD;  Location: [facility];  Service: Endoscopy;  Laterality: N/A;    Family History  Problem Relation Age of Onset  . Heart attack Father 69  . Lung cancer Mother 79   Social History:  reports that she has been smoking Cigarettes.  She started smoking about 46 years ago. She has a 50.00 pack-year smoking history. She has never used smokeless tobacco. She reports that she does not drink alcohol or use drugs.  Allergies: No Known Allergies  Medications Prior to Admission  Medication Sig Dispense Refill  . acetaminophen (TYLENOL) 500 MG tablet Take 1,000 mg by mouth every 6 (six) hours as needed for moderate pain.    Marland Kitchen albuterol (PROVENTIL HFA;VENTOLIN HFA) 108 (90 Base) MCG/ACT inhaler Inhale 1-2 puffs into the lungs every 6 (six) hours as needed for wheezing or shortness of breath.    Marland Kitchen amLODipine (NORVASC) 5 MG tablet Take 1 tablet (5 mg total) by mouth daily. 30 tablet 6  . aspirin EC 81 MG EC tablet Take 1 tablet (81 mg total) by mouth daily.    Marland Kitchen atorvastatin (LIPITOR) 80 MG tablet TAKE ONE TABLET BY MOUTH ONCE DAILY 90 tablet 3  . clopidogrel (PLAVIX) 75 MG tablet Take 1 tablet (75 mg total) by mouth daily with breakfast. 30 tablet 11  . furosemide (LASIX) 40 MG tablet Take 1 tablet (40 mg total)  by mouth 2 (two) times daily. 60 tablet 3  . hydrALAZINE (APRESOLINE) 50 MG tablet Take 1 tablet (50 mg total) by mouth every 8 (eight) hours. 90 tablet 6  . isosorbide mononitrate (IMDUR) 120 MG 24 hr tablet Take 1 tablet (120 mg total) by mouth daily. 30 tablet 6  . lisinopril (PRINIVIL,ZESTRIL) 20 MG tablet Take 1 tablet (20 mg total) by mouth daily. 30 tablet 6  . metoprolol 75 MG TABS Take 75 mg by mouth 2 (two) times daily. 60 tablet 6  . pantoprazole (PROTONIX) 40 MG tablet Take 1 tablet (40 mg total) by mouth 2 (two) times daily before a meal. 60  tablet 12  . Tiotropium Bromide Monohydrate (SPIRIVA RESPIMAT) 2.5 MCG/ACT AERS Inhale 1 puff into the lungs as needed.     . nitroGLYCERIN (NITROSTAT) 0.4 MG SL tablet Place 1 tablet (0.4 mg total) under the tongue every 5 (five) minutes as needed for chest pain. 25 tablet 3    No results found for this or any previous visit (from the past 48 hour(s)). No results found.  ROS  Blood pressure (!) 179/56, pulse 72, temperature 98.3 F (36.8 C), temperature source Oral, resp. rate 19, height 5\' 2"  (1.575 m), weight 183 lb (83 kg), SpO2 97 %. Physical Exam  Constitutional: She appears well-developed and well-nourished.  HENT:  Mouth/Throat: Oropharynx is clear and moist.  Eyes: Conjunctivae are normal. No scleral icterus.  Neck: No thyromegaly present.  Cardiovascular: Normal rate and regular rhythm.   Murmur (rade 3/6 holosystolic murmur heard all over the precordium.) heard. Respiratory: Effort normal and breath sounds normal.  GI: Soft. She exhibits no distension and no mass. There is no tenderness.  Musculoskeletal: She exhibits no edema.  Lymphadenopathy:    She has no cervical adenopathy.  Neurological: She is alert.  Skin: Skin is warm and dry.     Assessment/Plan History of peptic ulcer disease. EGD to document complete healing of these ulcers.  Hildred Laser, MD 10/18/2015, 12:51 PM

## 2015-10-23 ENCOUNTER — Encounter (HOSPITAL_COMMUNITY): Payer: Self-pay | Admitting: Internal Medicine

## 2015-10-24 ENCOUNTER — Emergency Department (HOSPITAL_COMMUNITY): Payer: Medicaid Other

## 2015-10-24 ENCOUNTER — Observation Stay (HOSPITAL_COMMUNITY)
Admission: EM | Admit: 2015-10-24 | Discharge: 2015-10-26 | Disposition: A | Discharge status: Home / Self Care | Payer: Medicaid Other | Attending: Internal Medicine | Admitting: Internal Medicine

## 2015-10-24 ENCOUNTER — Encounter (HOSPITAL_COMMUNITY): Payer: Self-pay | Admitting: Emergency Medicine

## 2015-10-24 DIAGNOSIS — I10 Essential (primary) hypertension: Secondary | ICD-10-CM | POA: Diagnosis present

## 2015-10-24 DIAGNOSIS — F17210 Nicotine dependence, cigarettes, uncomplicated: Secondary | ICD-10-CM | POA: Diagnosis not present

## 2015-10-24 DIAGNOSIS — Z6833 Body mass index (BMI) 33.0-33.9, adult: Secondary | ICD-10-CM | POA: Diagnosis not present

## 2015-10-24 DIAGNOSIS — E669 Obesity, unspecified: Secondary | ICD-10-CM | POA: Diagnosis not present

## 2015-10-24 DIAGNOSIS — A048 Other specified bacterial intestinal infections: Secondary | ICD-10-CM | POA: Diagnosis present

## 2015-10-24 DIAGNOSIS — E785 Hyperlipidemia, unspecified: Secondary | ICD-10-CM | POA: Insufficient documentation

## 2015-10-24 DIAGNOSIS — N183 Chronic kidney disease, stage 3 unspecified: Secondary | ICD-10-CM | POA: Diagnosis present

## 2015-10-24 DIAGNOSIS — I130 Hypertensive heart and chronic kidney disease with heart failure and stage 1 through stage 4 chronic kidney disease, or unspecified chronic kidney disease: Secondary | ICD-10-CM | POA: Diagnosis not present

## 2015-10-24 DIAGNOSIS — F17200 Nicotine dependence, unspecified, uncomplicated: Secondary | ICD-10-CM | POA: Diagnosis present

## 2015-10-24 DIAGNOSIS — Z951 Presence of aortocoronary bypass graft: Secondary | ICD-10-CM | POA: Diagnosis not present

## 2015-10-24 DIAGNOSIS — I350 Nonrheumatic aortic (valve) stenosis: Secondary | ICD-10-CM | POA: Diagnosis not present

## 2015-10-24 DIAGNOSIS — Z7902 Long term (current) use of antithrombotics/antiplatelets: Secondary | ICD-10-CM | POA: Diagnosis not present

## 2015-10-24 DIAGNOSIS — I2510 Atherosclerotic heart disease of native coronary artery without angina pectoris: Secondary | ICD-10-CM | POA: Diagnosis present

## 2015-10-24 DIAGNOSIS — Z79899 Other long term (current) drug therapy: Secondary | ICD-10-CM | POA: Insufficient documentation

## 2015-10-24 DIAGNOSIS — I252 Old myocardial infarction: Secondary | ICD-10-CM | POA: Diagnosis not present

## 2015-10-24 DIAGNOSIS — E876 Hypokalemia: Secondary | ICD-10-CM | POA: Diagnosis present

## 2015-10-24 DIAGNOSIS — Z7982 Long term (current) use of aspirin: Secondary | ICD-10-CM | POA: Diagnosis not present

## 2015-10-24 DIAGNOSIS — J449 Chronic obstructive pulmonary disease, unspecified: Secondary | ICD-10-CM | POA: Insufficient documentation

## 2015-10-24 DIAGNOSIS — B9681 Helicobacter pylori [H. pylori] as the cause of diseases classified elsewhere: Secondary | ICD-10-CM | POA: Insufficient documentation

## 2015-10-24 DIAGNOSIS — Z8541 Personal history of malignant neoplasm of cervix uteri: Secondary | ICD-10-CM | POA: Insufficient documentation

## 2015-10-24 DIAGNOSIS — I5032 Chronic diastolic (congestive) heart failure: Secondary | ICD-10-CM | POA: Insufficient documentation

## 2015-10-24 DIAGNOSIS — R002 Palpitations: Secondary | ICD-10-CM | POA: Insufficient documentation

## 2015-10-24 HISTORY — DX: Chronic or unspecified peptic ulcer, site unspecified, with hemorrhage: K27.4

## 2015-10-24 LAB — BASIC METABOLIC PANEL
ANION GAP: 11 (ref 5–15)
BUN: 8 mg/dL (ref 6–20)
CALCIUM: 9.7 mg/dL (ref 8.9–10.3)
CO2: 26 mmol/L (ref 22–32)
CREATININE: 1.01 mg/dL — AB (ref 0.44–1.00)
Chloride: 98 mmol/L — ABNORMAL LOW (ref 101–111)
GFR calc Af Amer: >60 mL/min (ref >60)
GFR, EST NON AFRICAN AMERICAN: 58 mL/min — AB (ref >60)
GLUCOSE: 121 mg/dL — AB (ref 65–99)
Potassium: 2.2 mmol/L — CL (ref 3.5–5.1)
Sodium: 135 mmol/L (ref 135–145)

## 2015-10-24 LAB — CBC
HCT: 34.6 % — ABNORMAL LOW (ref 36.0–46.0)
Hemoglobin: 11.1 g/dL — ABNORMAL LOW (ref 12.0–15.0)
MCH: 27 pg (ref 26.0–34.0)
MCHC: 32.1 g/dL (ref 30.0–36.0)
MCV: 84.2 fL (ref 78.0–100.0)
PLATELETS: 203 10*3/uL (ref 150–400)
RBC: 4.11 MIL/uL (ref 3.87–5.11)
RDW: 15.3 % (ref 11.5–15.5)
WBC: 7.9 10*3/uL (ref 4.0–10.5)

## 2015-10-24 LAB — I-STAT TROPONIN, ED: TROPONIN I, POC: 0.01 ng/mL (ref 0.00–0.08)

## 2015-10-24 MED ORDER — POTASSIUM CHLORIDE 10 MEQ/100ML IV SOLN
10.0000 meq | INTRAVENOUS | Status: AC
  Administered 2015-10-25 (×2): 10 meq via INTRAVENOUS
  Filled 2015-10-24 (×2): qty 100

## 2015-10-24 MED ORDER — HYDRALAZINE HCL 20 MG/ML IJ SOLN
10.0000 mg | Freq: Once | INTRAMUSCULAR | Status: AC
  Administered 2015-10-25: 10 mg via INTRAVENOUS
  Filled 2015-10-24: qty 1

## 2015-10-24 MED ORDER — MAGNESIUM SULFATE IN D5W 1-5 GM/100ML-% IV SOLN
1.0000 g | Freq: Once | INTRAVENOUS | Status: AC
  Administered 2015-10-25: 1 g via INTRAVENOUS
  Filled 2015-10-24: qty 100

## 2015-10-24 NOTE — ED Triage Notes (Signed)
Pt in EMS from home, reports feeling weak all day, started having chest pressure and palpitations later this evening. Pt was ST with EMS, HTN. Given 324 ASA and 2NTG. Last BP was 206/88. A/OX4

## 2015-10-24 NOTE — ED Provider Notes (Signed)
[facility] DEPT Provider Note   CSN: [id number] Arrival date & time: 10/24/15  2300  By signing my name below, I, Christina Boyle, attest that this documentation has been prepared under the direction and in the presence of Christina Hacker, MD . Electronically Signed: Higinio Boyle, Scribe. 10/24/2015. 11:57 PM.  History   Chief Complaint Chief Complaint  Patient presents with  . Chest Pain   The history is provided by the patient. No language interpreter was used.   HPI Comments: Christina Boyle is a 60 y.o. female with PMHx of CAD, HTN, cervical cancer and COPD, who presents to the Emergency Department complaining of gradually improving, chest pressure and palpitations that began earlier today. She notes associated chest pain that began this morning and shortness of breath; she reports her chest pain and pressure have subsided now in the ED. She states this is the first time she has experienced symptoms of palpitations. She denies leg swelling. She reports PSHx of cardiac catheterization on 07/03/15.   Past Medical History:  Diagnosis Date  . Bleeding ulcer 06/2015  . CAD (coronary artery disease)    Multivessel disease s/p CABG, DES SVG to OM  July 03 2015 (occluded SVG to PDA)  . Cervical cancer ([place])   . Chronic bronchitis   . Coarctation of aorta   . COPD (chronic obstructive pulmonary disease) ([place])   . Depression   . Essential hypertension   . GERD (gastroesophageal reflux disease)   . Heart murmur   . Hyperlipidemia   . NSTEMI (non-ST elevated myocardial infarction) [facility])    April 2017  . Obesity (BMI 30-39.9)     Patient Active Problem List   Diagnosis Date Noted  . Hypokalemia 10/25/2015  . CKD (chronic kidney disease) stage 3, GFR 30-59 ml/min 07/14/2015  . Thrombocytopenia ([place]) 07/12/2015  . Hypovolemia 07/12/2015  . AKI (acute kidney injury) ([place]) 07/11/2015  . Acute upper GI bleed 07/11/2015  . Upper GI bleeding 07/11/2015  . Acute renal insufficiency  06/30/2015  . Obesity (BMI 30-39.9) 06/30/2015  . Hx of CABG 2007 06/30/2015  . Hypertensive cardiovascular disease 06/30/2015  . Aortic stenosis-mild to moderate 06/30/2015  . Coronary artery disease involving native heart with unstable angina pectoris ([place]) 06/30/2015  . NSTEMI (non-ST elevated myocardial infarction) ([place]) 06/29/2015  . COPD exacerbation ([place]) 06/27/2015  . Acute gastric ulcer with hemorrhage   . Elevated troponin   . Upper gastrointestinal bleed 07/21/2014  . Symptomatic anemia 07/21/2014  . Chest pain 07/21/2014  . Acute renal failure ([place]) 07/21/2014  . Azotemia 07/21/2014  . Abnormal urinalysis 07/21/2014  . COPD (chronic obstructive pulmonary disease) ([place]) 07/21/2014  . Acute blood loss anemia 07/21/2014  . Hypertension 07/31/2011  . Carotid bruit 07/31/2011  . CERVICAL CANCER 04/04/2009  . Hyperlipemia 04/04/2009  . TOBACCO ABUSE 04/04/2009  . Cardiovascular disease 04/04/2009  . COARCTATION OF AORTA 04/04/2009  . FATIGUE 04/04/2009    Past Surgical History:  Procedure Laterality Date  . BREAST BIOPSY Left    Benign  . CARDIAC CATHETERIZATION N/A 06/30/2015   Procedure: Left Heart Cath and Cors/Grafts Angiography;  Surgeon: Leonie Man, MD;  Location: [facility];  Service: Cardiovascular;  Laterality: N/A;  . CARDIAC CATHETERIZATION N/A 07/03/2015   Procedure: Coronary Stent Intervention;  Surgeon: Peter M Martinique, MD;  Location: [facility];  Service: Cardiovascular;  Laterality: N/A;  . CERVICAL CONE BIOPSY    . CHOLECYSTECTOMY    . CORONARY ARTERY BYPASS GRAFT  02/2006   LIMA to LAD, SVG to OM, SVG to PDA  . ESOPHAGOGASTRODUODENOSCOPY N/A 07/22/2014   Procedure: ESOPHAGOGASTRODUODENOSCOPY (EGD);  Surgeon: [name], MD;  Location: [facility] ENDOSCOPY;  Service: Endoscopy;  Laterality: N/A;  . ESOPHAGOGASTRODUODENOSCOPY N/A 07/12/2015   Procedure: ESOPHAGOGASTRODUODENOSCOPY (EGD);  Surgeon: Rogene Houston, MD;  Location: [facility] ENDO  SUITE;  Service: Endoscopy;  Laterality: N/A;  . ESOPHAGOGASTRODUODENOSCOPY N/A 10/18/2015   Procedure: ESOPHAGOGASTRODUODENOSCOPY (EGD);  Surgeon: Rogene Houston, MD;  Location: [facility] ENDO SUITE;  Service: Endoscopy;  Laterality: N/A;  2:55    OB History    No data available       Home Medications    Prior to Admission medications   Medication Sig Start Date End Date Taking? Authorizing Provider  acetaminophen (TYLENOL) 500 MG tablet Take 1,000 mg by mouth every 6 (six) hours as needed for moderate pain.   Yes Historical Provider, MD  albuterol (PROVENTIL HFA;VENTOLIN HFA) 108 (90 Base) MCG/ACT inhaler Inhale 1-2 puffs into the lungs every 6 (six) hours as needed for wheezing or shortness of breath.   Yes Historical Provider, MD  amLODipine (NORVASC) 5 MG tablet Take 1 tablet (5 mg total) by mouth daily. 07/04/15  Yes Bhavinkumar Bhagat, PA  aspirin EC 81 MG EC tablet Take 1 tablet (81 mg total) by mouth daily. 07/23/14  Yes [name], MD  atorvastatin (LIPITOR) 80 MG tablet TAKE ONE TABLET BY MOUTH ONCE DAILY 01/26/15  Yes [name], MD  bismuth-metronidazole-tetracycline [facility]) [phone] MG capsule Take 3 capsules by mouth 4 (four) times daily -  before meals and at bedtime. 10/18/15  Yes Rogene Houston, MD  clopidogrel (PLAVIX) 75 MG tablet Take 1 tablet (75 mg total) by mouth daily with breakfast. 07/04/15  Yes Bhavinkumar Bhagat, PA  furosemide (LASIX) 40 MG tablet Take 1 tablet (40 mg total) by mouth 2 (two) times daily. 07/04/15  Yes Bhavinkumar Bhagat, PA  hydrALAZINE (APRESOLINE) 50 MG tablet Take 1 tablet (50 mg total) by mouth every 8 (eight) hours. 07/04/15  Yes Bhavinkumar Bhagat, PA  isosorbide mononitrate (IMDUR) 120 MG 24 hr tablet Take 1 tablet (120 mg total) by mouth daily. 07/05/15  Yes Bhavinkumar Bhagat, PA  lisinopril (PRINIVIL,ZESTRIL) 20 MG tablet Take 1 tablet (20 mg total) by mouth daily. 07/04/15  Yes Bhavinkumar Bhagat, PA  metoprolol 75 MG TABS Take 75  mg by mouth 2 (two) times daily. 07/04/15  Yes Bhavinkumar Bhagat, PA  nitroGLYCERIN (NITROSTAT) 0.4 MG SL tablet Place 1 tablet (0.4 mg total) under the tongue every 5 (five) minutes as needed for chest pain. 01/26/15  Yes [name], MD  pantoprazole (PROTONIX) 40 MG tablet Take 1 tablet (40 mg total) by mouth daily before breakfast. 10/18/15  Yes Rogene Houston, MD  Tiotropium Bromide Monohydrate (SPIRIVA RESPIMAT) 2.5 MCG/ACT AERS Inhale 1 puff into the lungs as needed.    Yes Historical Provider, MD   Family History Family History  Problem Relation Age of Onset  . Heart attack Father 20  . Lung cancer Mother 31    Social History Social History  Substance Use Topics  . Smoking status: Current Every Day Smoker    Packs/day: 1.00    Years: 50.00    Types: Cigarettes    Start date: 05/21/1969  . Smokeless tobacco: Never Used  . Alcohol use No   Allergies   Review of patient's allergies indicates no known allergies.  Review of Systems Review of Systems  Constitutional: Negative  for fever.  Respiratory: Positive for shortness of breath.   Cardiovascular: Positive for chest pain and palpitations. Negative for leg swelling.  Gastrointestinal: Negative for abdominal pain, nausea and vomiting.  All other systems reviewed and are negative.  Physical Exam Updated Vital Signs BP 198/58 (BP Location: Right Arm)   Pulse 87   Temp 97.8 F (36.6 C) (Oral)   Resp 17   Ht 5\' 2"  (1.575 m)   Wt 183 lb (83 kg)   SpO2 96%   BMI 33.47 kg/m   Physical Exam  Constitutional: She is oriented to person, place, and time. She appears well-developed and well-nourished. No distress.  HENT:  Head: Normocephalic and atraumatic.  Cardiovascular: Normal rate and regular rhythm.   Murmur heard. Pulmonary/Chest: Effort normal and breath sounds normal. No respiratory distress.  Scant wheeze  Abdominal: Soft. Bowel sounds are normal. There is no tenderness. There is no guarding.    Musculoskeletal: She exhibits edema.  Neurological: She is alert and oriented to person, place, and time.  Skin: Skin is warm and dry.  Psychiatric: She has a normal mood and affect.  Nursing note and vitals reviewed.  ED Treatments / Results  Labs (all labs ordered are listed, but only abnormal results are displayed) Labs Reviewed  BASIC METABOLIC PANEL - Abnormal; Notable for the following:       Result Value   Potassium 2.2 (*)    Chloride 98 (*)    Glucose, Bld 121 (*)    Creatinine, Ser 1.01 (*)    GFR calc non Af Amer 58 (*)    All other components within normal limits  CBC - Abnormal; Notable for the following:    Hemoglobin 11.1 (*)    HCT 34.6 (*)    All other components within normal limits  BRAIN NATRIURETIC PEPTIDE  URINALYSIS, ROUTINE W REFLEX MICROSCOPIC (NOT AT [facility])  Randolm Idol, ED   EKG  EKG Interpretation  Date/Time:  Tuesday October 24 2015 23:03:22 EDT Ventricular Rate:  87 PR Interval:    QRS Duration: 96 QT Interval:  386 QTC Calculation: 465 R Axis:   44 Text Interpretation:  Sinus rhythm Probable left atrial enlargement LVH with secondary repolarization abnormality Similar to prior EKG Confirmed by Reo Portela  MD, Jahira Swiss ([id number]) on 10/24/2015 11:06:21 PM       Radiology Dg Chest 2 View  Result Date: 10/24/2015 CLINICAL DATA:  Acute onset of generalized chest pain and shortness of breath. Initial encounter. EXAM: CHEST  2 VIEW COMPARISON:  Chest radiograph performed 07/11/2015 FINDINGS: The lungs are well-aerated. Pulmonary vascularity is at the upper limits of normal. There is no evidence of focal opacification, pleural effusion or pneumothorax. The heart is borderline normal in size. The patient is status post median sternotomy, with evidence of prior CABG. No acute osseous abnormalities are seen. Clips are noted within the right upper quadrant, reflecting prior cholecystectomy. IMPRESSION: No acute cardiopulmonary process seen. Electronically  Signed   By: Garald Balding M.D.   On: 10/24/2015 23:33   Procedures Procedures  DIAGNOSTIC STUDIES:  Oxygen Saturation is 96% on RA, normal by my interpretation.    COORDINATION OF CARE:  11:57 PM Discussed treatment Boyle with pt at bedside and pt agreed to Boyle.  Medications Ordered in ED Medications  potassium chloride 10 mEq in 100 mL IVPB (0 mEq Intravenous Stopped 10/25/15 0230)  magnesium sulfate IVPB 1 g 100 mL (0 g Intravenous Stopped 10/25/15 0230)  hydrALAZINE (APRESOLINE) injection 10 mg (10 mg Intravenous  Given 10/25/15 0013)  potassium chloride SA (K-DUR,KLOR-CON) CR tablet 40 mEq (40 mEq Oral Given 10/25/15 0315)    Initial Impression / Assessment and Boyle / ED Course  I have reviewed the triage vital signs and the nursing notes.  Pertinent labs & imaging results that were available during my care of the patient were reviewed by me and considered in my medical decision making (see chart for details).  Clinical Course    Patient presents with palpitations and "not feeling well." She is nontoxic. Did have some chest pain earlier today but currently is without chest pain. Recent stent placement. Vital signs are reassuring. EKG shows no significant change from prior tracing. She may have a U wave but this is hard to discern. Lab work notable for potassium of 2.2. Troponin negative. Chest x-ray is reassuring. Patient was ordered 3 runs of IV potassium as well as magnesium. This could certainly be causing palpitations and weakness. Will admit to hobs telemetry for potassium replacement.  I personally performed the services described in this documentation, which was scribed in my presence. The recorded information has been reviewed and is accurate.   Final Clinical Impressions(s) / ED Diagnoses   Final diagnoses:  Hypokalemia  Palpitations    New Prescriptions New Prescriptions   No medications on file     Christina Hacker, MD 10/25/15 [phone]

## 2015-10-25 ENCOUNTER — Encounter (HOSPITAL_COMMUNITY): Payer: Self-pay | Admitting: Internal Medicine

## 2015-10-25 DIAGNOSIS — I2510 Atherosclerotic heart disease of native coronary artery without angina pectoris: Secondary | ICD-10-CM | POA: Diagnosis present

## 2015-10-25 DIAGNOSIS — E876 Hypokalemia: Secondary | ICD-10-CM | POA: Diagnosis present

## 2015-10-25 DIAGNOSIS — A048 Other specified bacterial intestinal infections: Secondary | ICD-10-CM | POA: Diagnosis present

## 2015-10-25 LAB — URINALYSIS, ROUTINE W REFLEX MICROSCOPIC
Bilirubin Urine: NEGATIVE
GLUCOSE, UA: NEGATIVE mg/dL
Hgb urine dipstick: NEGATIVE
Ketones, ur: NEGATIVE mg/dL
LEUKOCYTES UA: NEGATIVE
NITRITE: NEGATIVE
PH: 7 (ref 5.0–8.0)
Protein, ur: NEGATIVE mg/dL
SPECIFIC GRAVITY, URINE: 1.011 (ref 1.005–1.030)

## 2015-10-25 LAB — CBC
HEMATOCRIT: 34.4 % — AB (ref 36.0–46.0)
Hemoglobin: 10.8 g/dL — ABNORMAL LOW (ref 12.0–15.0)
MCH: 26.4 pg (ref 26.0–34.0)
MCHC: 31.4 g/dL (ref 30.0–36.0)
MCV: 84.1 fL (ref 78.0–100.0)
Platelets: 204 10*3/uL (ref 150–400)
RBC: 4.09 MIL/uL (ref 3.87–5.11)
RDW: 15.3 % (ref 11.5–15.5)
WBC: 8.6 10*3/uL (ref 4.0–10.5)

## 2015-10-25 LAB — BASIC METABOLIC PANEL
ANION GAP: 14 (ref 5–15)
ANION GAP: 10 (ref 5–15)
Anion gap: 8 (ref 5–15)
BUN: 7 mg/dL (ref 6–20)
BUN: 5 mg/dL — AB (ref 6–20)
BUN: 7 mg/dL (ref 6–20)
CALCIUM: 9.4 mg/dL (ref 8.9–10.3)
CO2: 24 mmol/L (ref 22–32)
CO2: 24 mmol/L (ref 22–32)
CO2: 24 mmol/L (ref 22–32)
Calcium: 9.8 mg/dL (ref 8.9–10.3)
Calcium: 9.2 mg/dL (ref 8.9–10.3)
Chloride: 100 mmol/L — ABNORMAL LOW (ref 101–111)
Chloride: 101 mmol/L (ref 101–111)
Chloride: 102 mmol/L (ref 101–111)
Creatinine, Ser: 1.08 mg/dL — ABNORMAL HIGH (ref 0.44–1.00)
Creatinine, Ser: 0.96 mg/dL (ref 0.44–1.00)
Creatinine, Ser: 1.16 mg/dL — ABNORMAL HIGH (ref 0.44–1.00)
GFR calc Af Amer: >60 mL/min (ref >60)
GFR calc non Af Amer: 49 mL/min — ABNORMAL LOW (ref >60)
GFR, EST AFRICAN AMERICAN: 57 mL/min — AB (ref >60)
GFR, EST NON AFRICAN AMERICAN: 54 mL/min — AB (ref >60)
GLUCOSE: 181 mg/dL — AB (ref 65–99)
GLUCOSE: 157 mg/dL — AB (ref 65–99)
Glucose, Bld: 159 mg/dL — ABNORMAL HIGH (ref 65–99)
POTASSIUM: 2.5 mmol/L — AB (ref 3.5–5.1)
Potassium: 2.8 mmol/L — ABNORMAL LOW (ref 3.5–5.1)
Potassium: 3.1 mmol/L — ABNORMAL LOW (ref 3.5–5.1)
SODIUM: 138 mmol/L (ref 135–145)
Sodium: 135 mmol/L (ref 135–145)
Sodium: 134 mmol/L — ABNORMAL LOW (ref 135–145)

## 2015-10-25 LAB — MAGNESIUM: Magnesium: 2.1 mg/dL (ref 1.7–2.4)

## 2015-10-25 LAB — TROPONIN I
TROPONIN I: 0.05 ng/mL — AB (ref <0.03)
TROPONIN I: 0.05 ng/mL — AB (ref <0.03)
Troponin I: 0.03 ng/mL (ref <0.03)

## 2015-10-25 LAB — BRAIN NATRIURETIC PEPTIDE: B Natriuretic Peptide: 74.3 pg/mL (ref 0.0–100.0)

## 2015-10-25 MED ORDER — ZOLPIDEM TARTRATE 5 MG PO TABS
5.0000 mg | ORAL_TABLET | Freq: Once | ORAL | Status: AC
  Administered 2015-10-25: 5 mg via ORAL
  Filled 2015-10-25: qty 1

## 2015-10-25 MED ORDER — ENOXAPARIN SODIUM 40 MG/0.4ML ~~LOC~~ SOLN
40.0000 mg | SUBCUTANEOUS | Status: DC
  Administered 2015-10-25: 40 mg via SUBCUTANEOUS
  Filled 2015-10-25: qty 0.4

## 2015-10-25 MED ORDER — PANTOPRAZOLE SODIUM 40 MG PO TBEC
40.0000 mg | DELAYED_RELEASE_TABLET | Freq: Every day | ORAL | Status: DC
  Administered 2015-10-25 – 2015-10-26 (×2): 40 mg via ORAL
  Filled 2015-10-25 (×2): qty 1

## 2015-10-25 MED ORDER — AMLODIPINE BESYLATE 5 MG PO TABS
5.0000 mg | ORAL_TABLET | Freq: Every day | ORAL | Status: DC
  Administered 2015-10-25 – 2015-10-26 (×2): 5 mg via ORAL
  Filled 2015-10-25 (×2): qty 1

## 2015-10-25 MED ORDER — SODIUM CHLORIDE 0.9% FLUSH: 3.0000 mL | Freq: Two times a day (BID) | INTRAVENOUS | Status: DC

## 2015-10-25 MED ORDER — ACETAMINOPHEN 500 MG PO TABS: 1000.0000 mg | ORAL_TABLET | Freq: Four times a day (QID) | ORAL | Status: DC | PRN

## 2015-10-25 MED ORDER — ALBUTEROL SULFATE (2.5 MG/3ML) 0.083% IN NEBU: 3.0000 mL | INHALATION_SOLUTION | Freq: Four times a day (QID) | RESPIRATORY_TRACT | Status: DC | PRN

## 2015-10-25 MED ORDER — CLOPIDOGREL BISULFATE 75 MG PO TABS
75.0000 mg | ORAL_TABLET | Freq: Every day | ORAL | Status: DC
  Administered 2015-10-25 – 2015-10-26 (×2): 75 mg via ORAL
  Filled 2015-10-25 (×2): qty 1

## 2015-10-25 MED ORDER — BIS SUBCIT-METRONID-TETRACYC 140-125-125 MG PO CAPS: 3.0000 | ORAL_CAPSULE | Freq: Three times a day (TID) | ORAL | Status: DC

## 2015-10-25 MED ORDER — HYDRALAZINE HCL 50 MG PO TABS
50.0000 mg | ORAL_TABLET | Freq: Three times a day (TID) | ORAL | Status: DC
  Administered 2015-10-25 – 2015-10-26 (×4): 50 mg via ORAL
  Filled 2015-10-25 (×4): qty 1

## 2015-10-25 MED ORDER — DOCUSATE SODIUM 100 MG PO CAPS
100.0000 mg | ORAL_CAPSULE | Freq: Two times a day (BID) | ORAL | Status: DC
  Administered 2015-10-25 – 2015-10-26 (×3): 100 mg via ORAL
  Filled 2015-10-25 (×3): qty 1

## 2015-10-25 MED ORDER — POTASSIUM CHLORIDE 10 MEQ/100ML IV SOLN
10.0000 meq | INTRAVENOUS | Status: AC
  Administered 2015-10-25 (×4): 10 meq via INTRAVENOUS
  Filled 2015-10-25 (×2): qty 100

## 2015-10-25 MED ORDER — ASPIRIN EC 81 MG PO TBEC
81.0000 mg | DELAYED_RELEASE_TABLET | Freq: Every day | ORAL | Status: DC
  Administered 2015-10-25 – 2015-10-26 (×2): 81 mg via ORAL
  Filled 2015-10-25 (×2): qty 1

## 2015-10-25 MED ORDER — HYDRALAZINE HCL 20 MG/ML IJ SOLN: 10.0000 mg | Freq: Four times a day (QID) | INTRAMUSCULAR | Status: DC | PRN

## 2015-10-25 MED ORDER — HYDRALAZINE HCL 20 MG/ML IJ SOLN
10.0000 mg | Freq: Once | INTRAMUSCULAR | Status: AC
  Administered 2015-10-25: 10 mg via INTRAVENOUS
  Filled 2015-10-25: qty 1

## 2015-10-25 MED ORDER — LISINOPRIL 10 MG PO TABS
20.0000 mg | ORAL_TABLET | Freq: Every day | ORAL | Status: DC
  Administered 2015-10-25 – 2015-10-26 (×2): 20 mg via ORAL
  Filled 2015-10-25 (×2): qty 2

## 2015-10-25 MED ORDER — NICOTINE 14 MG/24HR TD PT24
14.0000 mg | MEDICATED_PATCH | Freq: Every day | TRANSDERMAL | Status: DC
  Filled 2015-10-25: qty 1

## 2015-10-25 MED ORDER — ONDANSETRON HCL 4 MG PO TABS: 4.0000 mg | ORAL_TABLET | Freq: Four times a day (QID) | ORAL | Status: DC | PRN

## 2015-10-25 MED ORDER — ATORVASTATIN CALCIUM 80 MG PO TABS
80.0000 mg | ORAL_TABLET | Freq: Every day | ORAL | Status: DC
  Administered 2015-10-25 – 2015-10-26 (×2): 80 mg via ORAL
  Filled 2015-10-25 (×2): qty 1

## 2015-10-25 MED ORDER — ONDANSETRON HCL 4 MG/2ML IJ SOLN: 4.0000 mg | Freq: Four times a day (QID) | INTRAMUSCULAR | Status: DC | PRN

## 2015-10-25 MED ORDER — POTASSIUM CHLORIDE CRYS ER 20 MEQ PO TBCR
40.0000 meq | EXTENDED_RELEASE_TABLET | Freq: Once | ORAL | Status: AC
  Administered 2015-10-25: 40 meq via ORAL
  Filled 2015-10-25: qty 2

## 2015-10-25 MED ORDER — POTASSIUM CHLORIDE 10 MEQ/100ML IV SOLN
10.0000 meq | INTRAVENOUS | Status: AC
  Administered 2015-10-25 (×2): 10 meq via INTRAVENOUS
  Filled 2015-10-25: qty 100

## 2015-10-25 MED ORDER — ISOSORBIDE MONONITRATE ER 60 MG PO TB24
120.0000 mg | ORAL_TABLET | Freq: Every day | ORAL | Status: DC
  Administered 2015-10-25 – 2015-10-26 (×2): 120 mg via ORAL
  Filled 2015-10-25 (×2): qty 2

## 2015-10-25 MED ORDER — METOPROLOL TARTRATE 25 MG PO TABS
75.0000 mg | ORAL_TABLET | Freq: Two times a day (BID) | ORAL | Status: DC
  Administered 2015-10-25 – 2015-10-26 (×3): 75 mg via ORAL
  Filled 2015-10-25 (×3): qty 3

## 2015-10-25 MED ORDER — TIOTROPIUM BROMIDE MONOHYDRATE 18 MCG IN CAPS
18.0000 ug | ORAL_CAPSULE | Freq: Every day | RESPIRATORY_TRACT | Status: DC
  Filled 2015-10-25: qty 5

## 2015-10-25 NOTE — H&P (Signed)
History and Physical    Christina Boyle [id number] DOB: 1955-09-01 DOA: 10/24/2015  PCP: Alonza Bogus, MD Consultants:  Harl Bowie - cardiology; Rehman - GI Patient coming from: home - lives alone; NOK: sister, Hewitt Blade, [phone]  Chief Complaint: feeling bad  HPI: Christina Boyle is a 60 y.o. female with medical history significant of ASCVD, HTN, HLD presenting with generalized malaise.  Patient has been feeling bad since the AM, felt bad all day.  Felt even worse when she laid down to go to bed.  Felt palpitations, rapid heart beat.  Some mild chest pressure but this did not feel similar to prior ACS.  Leg cramps started when the hospital room got cold and now they won't stop.   No other specific symptoms, just overall feeling poorly.   ED Course: Per Dr. Dina Rich: Patient presents with palpitations and "not feeling well." She is nontoxic. Did have some chest pain earlier today but currently is without chest pain. Recent stent placement. Vital signs are reassuring. EKG shows no significant change from prior tracing. She may have a U wave but this is hard to discern. Lab work notable for potassium of 2.2. Troponin negative. Chest x-ray is reassuring. Patient was ordered 3 runs of IV potassium as well as magnesium. This could certainly be causing palpitations and weakness. Will admit to hobs telemetry for potassium replacement.  Review of Systems: As per HPI; otherwise 10 point review of systems reviewed and negative.   Ambulatory Status:  Ambulates independently, has cane and walker but doesn't need them much  Past Medical History:  Diagnosis Date  . Bleeding ulcer 06/2015  . CAD (coronary artery disease)    Multivessel disease s/p CABG, DES SVG to OM  July 03 2015 (occluded SVG to PDA)  . Cervical cancer ([place])   . Chronic bronchitis   . Coarctation of aorta   . COPD (chronic obstructive pulmonary disease) ([place])   . Depression   . Essential hypertension   . GERD  (gastroesophageal reflux disease)   . Heart murmur   . Hyperlipidemia   . NSTEMI (non-ST elevated myocardial infarction) [facility])    April 2017  . Obesity (BMI 30-39.9)     Past Surgical History:  Procedure Laterality Date  . BREAST BIOPSY Left    Benign  . CARDIAC CATHETERIZATION N/A 06/30/2015   Procedure: Left Heart Cath and Cors/Grafts Angiography;  Surgeon: Leonie Man, MD;  Location: [place] CV LAB;  Service: Cardiovascular;  Laterality: N/A;  . CARDIAC CATHETERIZATION N/A 07/03/2015   Procedure: Coronary Stent Intervention;  Surgeon: Peter M Martinique, MD;  Location: [place] [facility] LAB;  Service: Cardiovascular;  Laterality: N/A;  . CERVICAL CONE BIOPSY    . CHOLECYSTECTOMY    . CORONARY ARTERY BYPASS GRAFT  02/2006   LIMA to LAD, SVG to OM, SVG to PDA  . ESOPHAGOGASTRODUODENOSCOPY N/A 07/22/2014   Procedure: ESOPHAGOGASTRODUODENOSCOPY (EGD);  Surgeon: [name], MD;  Location: [facility] ENDOSCOPY;  Service: Endoscopy;  Laterality: N/A;  . ESOPHAGOGASTRODUODENOSCOPY N/A 07/12/2015   Procedure: ESOPHAGOGASTRODUODENOSCOPY (EGD);  Surgeon: Rogene Houston, MD;  Location: [facility];  Service: Endoscopy;  Laterality: N/A;  . ESOPHAGOGASTRODUODENOSCOPY N/A 10/18/2015   Procedure: ESOPHAGOGASTRODUODENOSCOPY (EGD);  Surgeon: Rogene Houston, MD;  Location: [facility] ENDO SU[facility];  Service: Endoscopy;  Laterality: N/A;  2:55    Social History   Social History  . Marital status: Legally Separated    Spouse name: N/A  . Number of children: 5  . Years  of education: N/A   Occupational History  . unemployed    Social History Main Topics  . Smoking status: Current Every Day Smoker    Packs/day: 1.00    Years: 50.00    Types: Cigarettes    Start date: 05/21/1969  . Smokeless tobacco: Never Used  . Alcohol use No  . Drug use: No  . Sexual activity: Not Currently    Birth control/ protection: None   Other Topics Concern  . Not on file   Social History Narrative   Lives with sister.        No Known Allergies  Family History  Problem Relation Age of Onset  . Heart attack Father 30  . Lung cancer Mother 25    Prior to Admission medications   Medication Sig Start Date End Date Taking? Authorizing Provider  acetaminophen (TYLENOL) 500 MG tablet Take 1,000 mg by mouth every 6 (six) hours as needed for moderate pain.   Yes Historical Provider, MD  albuterol (PROVENTIL HFA;VENTOLIN HFA) 108 (90 Base) MCG/ACT inhaler Inhale 1-2 puffs into the lungs every 6 (six) hours as needed for wheezing or shortness of breath.   Yes Historical Provider, MD  amLODipine (NORVASC) 5 MG tablet Take 1 tablet (5 mg total) by mouth daily. 07/04/15  Yes Bhavinkumar Bhagat, PA  aspirin EC 81 MG EC tablet Take 1 tablet (81 mg total) by mouth daily. 07/23/14  Yes [name], MD  atorvastatin (LIPITOR) 80 MG tablet TAKE ONE TABLET BY MOUTH ONCE DAILY 01/26/15  Yes [name], MD  bismuth-metronidazole-tetracycline [facility]) [id number] MG capsule Take 3 capsules by mouth 4 (four) times daily -  before meals and at bedtime. 10/18/15  Yes Rogene Houston, MD  clopidogrel (PLAVIX) 75 MG tablet Take 1 tablet (75 mg total) by mouth daily with breakfast. 07/04/15  Yes Bhavinkumar Bhagat, PA  furosemide (LASIX) 40 MG tablet Take 1 tablet (40 mg total) by mouth 2 (two) times daily. 07/04/15  Yes Bhavinkumar Bhagat, PA  hydrALAZINE (APRESOLINE) 50 MG tablet Take 1 tablet (50 mg total) by mouth every 8 (eight) hours. 07/04/15  Yes Bhavinkumar Bhagat, PA  isosorbide mononitrate (IMDUR) 120 MG 24 hr tablet Take 1 tablet (120 mg total) by mouth daily. 07/05/15  Yes Bhavinkumar Bhagat, PA  lisinopril (PRINIVIL,ZESTRIL) 20 MG tablet Take 1 tablet (20 mg total) by mouth daily. 07/04/15  Yes Bhavinkumar Bhagat, PA  metoprolol 75 MG TABS Take 75 mg by mouth 2 (two) times daily. 07/04/15  Yes Bhavinkumar Bhagat, PA  nitroGLYCERIN (NITROSTAT) 0.4 MG SL tablet Place 1 tablet (0.4 mg total) under the tongue every 5 (five)  minutes as needed for chest pain. 01/26/15  Yes [name], MD  pantoprazole (PROTONIX) 40 MG tablet Take 1 tablet (40 mg total) by mouth daily before breakfast. 10/18/15  Yes Rogene Houston, MD  Tiotropium Bromide Monohydrate (SPIRIVA RESPIMAT) 2.5 MCG/ACT AERS Inhale 1 puff into the lungs as needed.    Yes Historical Provider, MD    Physical Exam: Vitals:   10/25/15 0130 10/25/15 0200 10/25/15 0230 10/25/15 0300  BP: (!) 202/61 (!) 201/61 195/60 (!) 201/65  Pulse: 89 93 90 87  Resp: 16 17 21 18   Temp:      TempSrc:      SpO2: 96% 96% 93% 95%  Weight:      Height:         General:  Appears calm and is NAD but is sitting up in the bed with  legs leaning over the side and does appear uncomfortable Eyes:  PERRL, EOMI, normal lids, iris ENT:  grossly normal hearing, lips & tongue, mmm Neck:  no LAD, masses or thyromegaly Cardiovascular:  RRR, no r/g, loud murmur. No LE edema.  Respiratory:  CTA bilaterally, no w/r/r. Normal respiratory effort. Abdomen:  soft, ntnd, NABS Skin:  no rash or induration seen on limited exam Musculoskeletal:  grossly normal tone BUE/BLE, good ROM, no bony abnormality Psychiatric:  grossly normal mood and affect, speech fluent and appropriate, AOx3 Neurologic:  CN 2-12 grossly intact, moves all extremities in coordinated fashion, sensation intact  Labs on Admission: I have personally reviewed following labs and imaging studies  CBC:  Recent Labs Lab 10/18/15 1215 10/24/15 2315  WBC  --  7.9  HGB 12.1 11.1*  HCT 36.6 34.6*  MCV  --  84.2  PLT  --  123456   Basic Metabolic Panel:  Recent Labs Lab 10/24/15 2315  NA 135  K 2.2*  CL 98*  CO2 26  GLUCOSE 121*  BUN 8  CREATININE 1.01*  CALCIUM 9.7   GFR: Estimated Creatinine Clearance: 57.7 mL/min (by C-G formula based on SCr of 1.01 mg/dL). Liver Function Tests: No results for input(s): AST, ALT, ALKPHOS, BILITOT, PROT, ALBUMIN in the last 168 hours. No results for input(s): LIPASE,  AMYLASE in the last 168 hours. No results for input(s): AMMONIA in the last 168 hours. Coagulation Profile: No results for input(s): INR, PROTIME in the last 168 hours. Cardiac Enzymes: No results for input(s): CKTOTAL, CKMB, CKMBINDEX, TROPONINI in the last 168 hours. BNP (last 3 results) No results for input(s): PROBNP in the last 8760 hours. HbA1C: No results for input(s): HGBA1C in the last 72 hours. CBG: No results for input(s): GLUCAP in the last 168 hours. Lipid Profile: No results for input(s): CHOL, HDL, LDLCALC, TRIG, CHOLHDL, LDLDIRECT in the last 72 hours. Thyroid Function Tests: No results for input(s): TSH, T4TOTAL, FREET4, T3FREE, THYROIDAB in the last 72 hours. Anemia Panel: No results for input(s): VITAMINB12, FOLATE, FERRITIN, TIBC, IRON, RETICCTPCT in the last 72 hours. Urine analysis:    Component Value Date/Time   COLORURINE YELLOW 07/11/2015 1800   APPEARANCEUR CLEAR 07/11/2015 1800   LABSPEC 1.020 07/11/2015 1800   PHURINE 5.0 07/11/2015 1800   GLUCOSEU NEGATIVE 07/11/2015 1800   HGBUR NEGATIVE 07/11/2015 1800   BILIRUBINUR NEGATIVE 07/11/2015 1800   KETONESUR NEGATIVE 07/11/2015 1800   PROTEINUR NEGATIVE 07/11/2015 1800   UROBILINOGEN 0.2 07/21/2014 0333   NITRITE NEGATIVE 07/11/2015 1800   LEUKOCYTESUR NEGATIVE 07/11/2015 1800    Creatinine Clearance: Estimated Creatinine Clearance: 57.7 mL/min (by C-G formula based on SCr of 1.01 mg/dL).  Sepsis Labs: @LABRCNTIP (procalcitonin:4,lacticidven:4) )No results found for this or any previous visit (from the past 240 hour(s)).   Radiological Exams on Admission: Dg Chest 2 View  Result Date: 10/24/2015 CLINICAL DATA:  Acute onset of generalized chest pain and shortness of breath. Initial encounter. EXAM: CHEST  2 VIEW COMPARISON:  Chest radiograph performed 07/11/2015 FINDINGS: The lungs are well-aerated. Pulmonary vascularity is at the upper limits of normal. There is no evidence of focal opacification,  pleural effusion or pneumothorax. The heart is borderline normal in size. The patient is status post median sternotomy, with evidence of prior CABG. No acute osseous abnormalities are seen. Clips are noted within the right upper quadrant, reflecting prior cholecystectomy. IMPRESSION: No acute cardiopulmonary process seen. Electronically Signed   By: Garald Balding M.D.   On: 10/24/2015 23:33  EKG: Independently reviewed.  NSR with prolonged QT (511); nonspecific ST changes with no evidence of acute ischemia  Assessment/Plan Principal Problem:   Hypokalemia Active Problems:   Hyperlipemia   TOBACCO ABUSE   Hypertension   COPD (chronic obstructive pulmonary disease) (HCC)   Aortic stenosis-mild to moderate   CKD (chronic kidney disease) stage 3, GFR 30-59 ml/min   Hypokalemia, severe -Patient with myalgias, malaise, cramps, and palpitations which can all be attributed to severe hypokalemia -Likely etiology appears to be continued use of Lasix without monitoring/K+ supplementation -Prior K+ was 3.3 in 5/17 -Will place in observation status and monitor on telemetry -Was given 30 mEq IV potassium and 40 mEq PO K+ as well as magnesium while in the ER -This would be expected to raise K+ 0.7 points to 2.9 -Will give an additional 4 runs of KCl and recheck K+ in AM -Once patient is improved and/or no longer symptomatic, she is likely able to be discharged within the next 1-2 days  HTN -Continue home meds - Hydralazine, Lisinopril, Metoprolol -Hold Norvasc and consider discontinuation  HFpEF -Echo in 4/17 showed EF 65-70% with grade 1 diastolic dysfunction -While patient may need intermittent Lasix, for now would recommend discontinuation -If ongoing need, would suggest adding potassium supplementation with routine BMP to ensure no recurrence of severe hypokalemia -With CHF, Norvasc may not be the best option for patient's HTN due to risk of edema and/or CHF.  Will hold.  Tobacco  dependence with COPD -Tobacco Dependence: encourage cessation.  This was discussed with the patient and should be reviewed on an ongoing basis.  Patch ordered at patient request. -COPD: continue Spiriva, albuterol.  CAD -Recent (April 2017) admission for NSTEMI with stent placement -Continue ASA and Plavix as well as Imdur -Continue Lipitor 80 mg daily  H pylori infection -Continue Pylera -Continue PPI   DVT prophylaxis:  Lovenox  Code Status: Full - confirmed with patient/family Family Communication: family present throughout evaluation Disposition Plan:  Home once clinically improved Consults called: None  Admission status: Observation to telemetry    Karmen Bongo MD [facility]  If 7PM-7AM, please contact night-coverage [email or web address] Password [facility]  10/25/2015, 3:39 AM

## 2015-10-25 NOTE — ED Notes (Signed)
Called main lab to add on BNP 

## 2015-10-25 NOTE — Progress Notes (Signed)
PROGRESS NOTE                                                                                                                                                                                                             Patient Demographics:    Christina Boyle, is a 60 y.o. female, DOB - 05-23-1955, RO:[id number]  Admit date - 10/24/2015   Admitting Physician Karmen Bongo, MD  Outpatient Primary MD for the patient is [name],[name], MD  LOS - 0  Outpatient Specialists: [place] - cardiology; Rehman - GI  Chief Complaint  Patient presents with  . Chest Pain       Brief Narrative   60 y.o. female with medical history significant of ASCVD, HTN, HLD presenting with generalized malaise,fatigue and cramps, workup significant for severe hypokalemia with potassium of 2.2, patient was started on Lasix 40 mg oral twice a day this may.  Subjective:    Christina Boyle today has, No headache, No chest pain, No abdominal pain - Still reports feeling weak and fatigued, but reports she is feeling better.  Assessment  & Plan :    Principal Problem:   Hypokalemia Active Problems:   Hyperlipemia   TOBACCO ABUSE   Hypertension   COPD (chronic obstructive pulmonary disease) (HCC)   Aortic stenosis-mild to moderate   CKD (chronic kidney disease) stage 3, GFR 30-59 ml/min   CAD (coronary artery disease)   H. pylori infection  Hypokalemia, severe -Patient with myalgias, malaise, cramps, and palpitations which can all be attributed to severe hypokalemia -Likely etiology appears to be continued use of Lasix without monitoring/K+ supplementation -Prior K+ was 3.3 in 5/17 -Will place in observation status and monitor on telemetry - We'll recheck level this afternoon, will check magnesium level as well, and will replete as needed.  HTN -Continue home meds - Hydralazine, Lisinopril, Metoprolol and amlodipine - Continue with when necessary  hydralazine  Chronic diastolic CHF -Echo in [id number] showed EF 65-70% with grade 1 diastolic dysfunction - Patient appears to be euvolemic on admission, she was on Lasix 40 mg oral twice a day, but he likely will need same home dose on discharge.  Tobacco dependence with COPD -Tobacco Dependence: encourage cessation.    COPD  - continue Spiriva, albuterol.  CAD -Recent (April 2017) admission for NSTEMI with stent placement -Continue ASA and Plavix  as well as Imdur -Continue Lipitor 80 mg daily  H pylori infection -Continue Pylera -Continue PPI    Code Status : Full  Family Communication  : Discussed with patient  Disposition Plan  : Home when stable  Consults  :  None  Procedures  : None  DVT Prophylaxis  :  Lovenox -  Lab Results  Component Value Date   PLT 204 10/25/2015    Antibiotics  :   Anti-infectives    None        Objective:   Vitals:   10/25/15 0345 10/25/15 0410 10/25/15 0627 10/25/15 1042  BP: 171/64 (!) 200/52 (!) 171/63 (!) 166/47  Pulse: 86 94  82  Resp: 18 18    Temp:  97.5 F (36.4 C)    TempSrc:  Oral    SpO2: 96% 96%    Weight:  82.2 kg (181 lb 4.8 oz)    Height:  5\' 2"  (1.575 m)      Wt Readings from Last 3 Encounters:  10/25/15 82.2 kg (181 lb 4.8 oz)  10/18/15 83 kg (183 lb)  08/22/15 82.2 kg (181 lb 3.2 oz)     Intake/Output Summary (Last 24 hours) at 10/25/15 1054 Last data filed at 10/25/15 0800  Gross per 24 hour  Intake              220 ml  Output                0 ml  Net              220 ml     Physical Exam  Awake Alert, Oriented X 3, No new F.N deficits, Normal affect [place].AT,PERRAL Supple Neck,No JVD, No cervical lymphadenopathy appriciated.  Symmetrical Chest wall movement, Good air movement bilaterally, CTAB RRR,No Gallops,Rubs or new Murmurs, No Parasternal Heave +ve B.Sounds, Abd Soft, No tenderness, No organomegaly appriciated, No rebound - guarding or rigidity. No Cyanosis, Clubbing or edema, No  new Rash or bruise      Data Review:    CBC  Recent Labs Lab 10/18/15 1215 10/24/15 2315 10/25/15 0446  WBC  --  7.9 8.6  HGB 12.1 11.1* 10.8*  HCT 36.6 34.6* 34.4*  PLT  --  203 204  MCV  --  84.2 84.1  MCH  --  27.0 26.4  MCHC  --  32.1 31.4  RDW  --  15.3 15.3    Chemistries   Recent Labs Lab 10/24/15 2315 10/25/15 0446  NA 135 138  K 2.2* 2.5*  CL 98* 100*  CO2 26 24  GLUCOSE 121* 159*  BUN 8 7  CREATININE 1.01* 1.08*  CALCIUM 9.7 9.8   ------------------------------------------------------------------------------------------------------------------ No results for input(s): CHOL, HDL, LDLCALC, TRIG, CHOLHDL, LDLDIRECT in the last 72 hours.  Lab Results  Component Value Date   HGBA1C 6.3 (H) 06/29/2014   ------------------------------------------------------------------------------------------------------------------ No results for input(s): TSH, T4TOTAL, T3FREE, THYROIDAB in the last 72 hours.  Invalid input(s): FREET3 ------------------------------------------------------------------------------------------------------------------ No results for input(s): VITAMINB12, FOLATE, FERRITIN, TIBC, IRON, RETICCTPCT in the last 72 hours.  Coagulation profile No results for input(s): INR, PROTIME in the last 168 hours.  No results for input(s): DDIMER in the last 72 hours.  Cardiac Enzymes  Recent Labs Lab 10/25/15 0446  TROPONINI 0.03*   ------------------------------------------------------------------------------------------------------------------    Component Value Date/Time   BNP 74.3 10/24/2015 2315    Inpatient Medications  Scheduled Meds: . amLODipine  5 mg Oral Daily  . aspirin EC  81 mg Oral Daily  . atorvastatin  80 mg Oral Daily  . bismuth-metronidazole-tetracycline  3 capsule Oral TID AC & HS  . clopidogrel  75 mg Oral Q breakfast  . docusate sodium  100 mg Oral BID  . enoxaparin (LOVENOX) injection  40 mg Subcutaneous Q24H  .  hydrALAZINE  50 mg Oral Q8H  . isosorbide mononitrate  120 mg Oral Daily  . lisinopril  20 mg Oral Daily  . metoprolol tartrate  75 mg Oral BID  . nicotine  14 mg Transdermal Daily  . pantoprazole  40 mg Oral QAC breakfast  . sodium chloride flush  3 mL Intravenous Q12H  . tiotropium  18 mcg Inhalation Daily   Continuous Infusions:  PRN Meds:.acetaminophen, albuterol, hydrALAZINE, ondansetron **OR** ondansetron (ZOFRAN) IV  Micro Results No results found for this or any previous visit (from the past 240 hour(s)).  Radiology Reports Dg Chest 2 View  Result Date: 10/24/2015 CLINICAL DATA:  Acute onset of generalized chest pain and shortness of breath. Initial encounter. EXAM: CHEST  2 VIEW COMPARISON:  Chest radiograph performed 07/11/2015 FINDINGS: The lungs are well-aerated. Pulmonary vascularity is at the upper limits of normal. There is no evidence of focal opacification, pleural effusion or pneumothorax. The heart is borderline normal in size. The patient is status post median sternotomy, with evidence of prior CABG. No acute osseous abnormalities are seen. Clips are noted within the right upper quadrant, reflecting prior cholecystectomy. IMPRESSION: No acute cardiopulmonary process seen. Electronically Signed   By: Garald Balding M.D.   On: 10/24/2015 23:33     Danyel Tobey M.D on 10/25/2015 at 10:54 AM  Between 7am to 7pm - Pager - [phone]  After 7pm go to [email or web address] - password [facility]  [facility] -  Office  [phone]

## 2015-10-25 NOTE — ED Notes (Signed)
Pt called out stating she "doesnt feel right" Denies CP/SOB, dizziness/lightheadedness. Repeat EKG taken and given to Horton MD.

## 2015-10-25 NOTE — Progress Notes (Signed)
CRITICAL VALUE ALERT  Critical value received:  Potassium  Date of notification:  10/25/2015  Time of notification:  [id number]  Critical value read back:Yes.    Nurse who received alert:  Darryl  MD notified (1st page):  Baltazar Najjar  Time of first page:  [phone]  MD notified (2nd page):  Time of second page:  Responding MD:  Baltazar Najjar  Time MD responded:  [phone]

## 2015-10-26 DIAGNOSIS — E876 Hypokalemia: Secondary | ICD-10-CM | POA: Diagnosis not present

## 2015-10-26 LAB — BASIC METABOLIC PANEL
ANION GAP: 10 (ref 5–15)
ANION GAP: 9 (ref 5–15)
BUN: 6 mg/dL (ref 6–20)
BUN: 8 mg/dL (ref 6–20)
CO2: 25 mmol/L (ref 22–32)
CO2: 21 mmol/L — ABNORMAL LOW (ref 22–32)
Calcium: 9.4 mg/dL (ref 8.9–10.3)
Calcium: 9.4 mg/dL (ref 8.9–10.3)
Chloride: 102 mmol/L (ref 101–111)
Chloride: 106 mmol/L (ref 101–111)
Creatinine, Ser: 1.16 mg/dL — ABNORMAL HIGH (ref 0.44–1.00)
Creatinine, Ser: 1.15 mg/dL — ABNORMAL HIGH (ref 0.44–1.00)
GFR calc Af Amer: 57 mL/min — ABNORMAL LOW (ref >60)
GFR calc Af Amer: 58 mL/min — ABNORMAL LOW (ref >60)
GFR, EST NON AFRICAN AMERICAN: 49 mL/min — AB (ref >60)
GFR, EST NON AFRICAN AMERICAN: 50 mL/min — AB (ref >60)
Glucose, Bld: 110 mg/dL — ABNORMAL HIGH (ref 65–99)
Glucose, Bld: 135 mg/dL — ABNORMAL HIGH (ref 65–99)
POTASSIUM: 2.9 mmol/L — AB (ref 3.5–5.1)
POTASSIUM: 3.5 mmol/L (ref 3.5–5.1)
SODIUM: 137 mmol/L (ref 135–145)
SODIUM: 136 mmol/L (ref 135–145)

## 2015-10-26 MED ORDER — NICOTINE 14 MG/24HR TD PT24: 14.0000 mg | MEDICATED_PATCH | Freq: Every day | TRANSDERMAL | 0 refills | Status: DC

## 2015-10-26 MED ORDER — POTASSIUM CHLORIDE ER 10 MEQ PO TBCR: EXTENDED_RELEASE_TABLET | ORAL | 0 refills | Status: DC

## 2015-10-26 MED ORDER — POTASSIUM CHLORIDE CRYS ER 20 MEQ PO TBCR
40.0000 meq | EXTENDED_RELEASE_TABLET | Freq: Once | ORAL | Status: AC
  Administered 2015-10-26: 40 meq via ORAL
  Filled 2015-10-26: qty 2

## 2015-10-26 MED ORDER — FUROSEMIDE 40 MG PO TABS: 40.0000 mg | ORAL_TABLET | Freq: Every day | ORAL | 3 refills | Status: DC

## 2015-10-26 MED ORDER — POTASSIUM CHLORIDE 10 MEQ/100ML IV SOLN
10.0000 meq | INTRAVENOUS | Status: AC
  Administered 2015-10-26 (×2): 10 meq via INTRAVENOUS
  Filled 2015-10-26 (×2): qty 100

## 2015-10-26 NOTE — Discharge Instructions (Signed)
Follow with Primary MD [name],[name], MD in 7 days   Get CBC, CMP,  checked  by Primary MD next visit.    Activity: As tolerated with Full fall precautions use walker/cane & assistance as needed   Disposition Home    Diet: Heart Healthy  , with feeding assistance and aspiration precautions.  For Heart failure patients - Check your Weight same time everyday, if you gain over 2 pounds, or you develop in leg swelling, experience more shortness of breath or chest pain, call your Primary MD immediately. Follow Cardiac Low Salt Diet and 1.5 lit/day fluid restriction.   On your next visit with your primary care physician please Get Medicines reviewed and adjusted.   Please request your Prim.MD to go over all Hospital Tests and Procedure/Radiological results at the follow up, please get all Hospital records sent to your Prim MD by signing hospital release before you go home.   If you experience worsening of your admission symptoms, develop shortness of breath, life threatening emergency, suicidal or homicidal thoughts you must seek medical attention immediately by calling 911 or calling your MD immediately  if symptoms less severe.  You Must read complete instructions/literature along with all the possible adverse reactions/side effects for all the Medicines you take and that have been prescribed to you. Take any new Medicines after you have completely understood and accpet all the possible adverse reactions/side effects.   Do not drive, operating heavy machinery, perform activities at heights, swimming or participation in water activities or provide baby sitting services if your were admitted for syncope or siezures until you have seen by Primary MD or a Neurologist and advised to do so again.  Do not drive when taking Pain medications.    Do not take more than prescribed Pain, Sleep and Anxiety Medications  Special Instructions: If you have smoked or chewed Tobacco  in the last 2 yrs  please stop smoking, stop any regular Alcohol  and or any Recreational drug use.  Wear Seat belts while driving.   Please note  You were cared for by a hospitalist during your hospital stay. If you have any questions about your discharge medications or the care you received while you were in the hospital after you are discharged, you can call the unit and asked to speak with the hospitalist on call if the hospitalist that took care of you is not available. Once you are discharged, your primary care physician will handle any further medical issues. Please note that NO REFILLS for any discharge medications will be authorized once you are discharged, as it is imperative that you return to your primary care physician (or establish a relationship with a primary care physician if you do not have one) for your aftercare needs so that they can reassess your need for medications and monitor your lab values.

## 2015-10-26 NOTE — Discharge Summary (Signed)
Christina Boyle, is a 60 y.o. female  DOB 27-Mar-1955  MRN [id number].  Admission date:  10/24/2015  Admitting Physician  Karmen Bongo, MD  Discharge Date:  10/26/2015   Primary MD  Alonza Bogus, MD  Recommendations for primary care physician for things to follow:  - Based check BMP your next visit and reassess potassium supplement dose. - Patient Lasix dose was lowered from 40 twice a day to once daily, reassess during next visit.  Admission Diagnosis  Palpitations [R00.2] Hypokalemia [E87.6]   Discharge Diagnosis  Palpitations [R00.2] Hypokalemia [E87.6]    Principal Problem:   Hypokalemia Active Problems:   Hyperlipemia   TOBACCO ABUSE   Hypertension   COPD (chronic obstructive pulmonary disease) (HCC)   Aortic stenosis-mild to moderate   CKD (chronic kidney disease) stage 3, GFR 30-59 ml/min   CAD (coronary artery disease)   H. pylori infection      Past Medical History:  Diagnosis Date  . Bleeding ulcer 06/2015  . CAD (coronary artery disease)    Multivessel disease s/p CABG, DES SVG to OM  July 03 2015 (occluded SVG to PDA)  . Cervical cancer ([place])   . Chronic bronchitis   . Coarctation of aorta   . COPD (chronic obstructive pulmonary disease) ([place])   . Depression   . Essential hypertension   . GERD (gastroesophageal reflux disease)   . Heart murmur   . Hyperlipidemia   . NSTEMI (non-ST elevated myocardial infarction) [facility])    April 2017  . Obesity (BMI 30-39.9)     Past Surgical History:  Procedure Laterality Date  . BREAST BIOPSY Left    Benign  . CARDIAC CATHETERIZATION N/A 06/30/2015   Procedure: Left Heart Cath and Cors/Grafts Angiography;  Surgeon: Leonie Man, MD;  Location: [place] [facility];  Service: Cardiovascular;  Laterality: N/A;  . CARDIAC CATHETERIZATION N/A 07/03/2015   Procedure: Coronary Stent Intervention;  Surgeon: Peter M Martinique, MD;   Location: [place] [facility];  Service: Cardiovascular;  Laterality: N/A;  . CERVICAL CONE BIOPSY    . CHOLECYSTECTOMY    . CORONARY ARTERY BYPASS GRAFT  02/2006   LIMA to LAD, SVG to OM, SVG to PDA  . ESOPHAGOGASTRODUODENOSCOPY N/A 07/22/2014   Procedure: ESOPHAGOGASTRODUODENOSCOPY (EGD);  Surgeon: [name], MD;  Location: [facility] ENDOSCOPY;  Service: Endoscopy;  Laterality: N/A;  . ESOPHAGOGASTRODUODENOSCOPY N/A 07/12/2015   Procedure: ESOPHAGOGASTRODUODENOSCOPY (EGD);  Surgeon: Rogene Houston, MD;  [facility]: [facility];  Service: Endoscopy;  Laterality: N/A;  . ESOPHAGOGASTRODUODENOSCOPY N/A 10/18/2015   Procedure: ESOPHAGOGASTRODUODENOSCOPY (EGD);  Surgeon: Rogene Houston, MD;  Location: [facility];  Service: Endoscopy;  Laterality: N/A;  2:55       History of present illness and  Hospital Course:     Kindly see H&P for history of present illness and admission details, please review complete Labs, Consult reports and Test reports for all details in brief  HPI  from the history and physical done on the day of admission  HPI: Christina Boyle is a 60 y.o. female with medical history significant of ASCVD, HTN, HLD presenting with generalized malaise.  Patient has been feeling bad since the AM, felt bad all day.  Felt even worse when she laid down to go to bed.  Felt palpitations, rapid heart beat.  Some mild chest pressure but this did not feel similar to prior ACS.  Leg cramps started when the hospital room got cold and now they won't stop.   No other specific symptoms, just overall feeling poorly.   ED Course: Per Dr. Dina Rich: Patient presents with palpitations and "not feeling well." She is nontoxic. Did have some chest pain earlier today but currently is without chest pain. Recent stent placement. Vital signs are reassuring. EKG shows no significant change from prior tracing. She may have a U wave but this is hard to discern. Lab work notable for potassium of 2.2. Troponin  negative. Chest x-ray is reassuring. Patient was ordered 3 runs of IV potassium as well as magnesium. This could certainly be causing palpitations and weakness. Will admit to hobs telemetry for potassium replacement.  Hospital Course  60 y.o.femalewith medical history significant of ASCVD, HTN, HLD presenting with generalized malaise,fatigue and cramps, workup significant for severe hypokalemia with potassium of 2.2, patient was started on Lasix 40 mg oral twice a day this May  Hypokalemia, severe -Patient with myalgias, malaise, cramps, and palpitations which can all be attributed to severe hypokalemia -Likely etiology appears to be continued use of Lasix without monitoring/K+ supplementation -Prior K+ was 3.3 in 5/17 -Potassium was repleted aggressively during hospital stay, most recent potassium is 3.5, patient will be discharged on potassium supplement, to see her PCP next week for repeat labs as discussed with her.  HTN -Continue home meds - Hydralazine, Lisinopril, Metoprolol and amlodipine   Chronic diastolic CHF -Echo in [id number] showed EF 65-70% with grade 1 diastolic dysfunction - Patient appears to be euvolemic on admission, she was on Lasix 40 mg oral twice a day, no evidence of volume overload during hospital stay off Lasix, so lowered her dose to 40 mg, she was instructed to monitor closely for leg swelling or dyspnea, if she started developing of his symptoms instructed to go back to home dose Lasix 40 twice a day.  Tobacco dependence with COPD -Tobacco Dependence: encourage cessation.   COPD  - continue Spiriva, albuterol.  CAD -Recent (April 2017) admission for NSTEMI with stent placement -Continue ASA and Plavix as well as Imdur -Continue Lipitor 80 mg daily  H pylori infection -Continue Pylera -Continue PPI     Discharge Condition:  Stable   Follow UP  Follow-up Information    [name],[name], MD Follow up in 1 week(s).   Specialty:  Pulmonary  Disease Contact information: [address] [place] [address] [phone]             Discharge Instructions  and  Discharge Medications     Discharge Instructions    Diet - low sodium heart healthy    Complete by:  As directed   Discharge instructions    Complete by:  As directed   Follow with Primary MD [name],[name], MD in 7 days   Get CBC, CMP,  checked  by Primary MD next visit.    Activity: As tolerated with Full fall precautions use walker/cane & assistance as needed   Disposition Home    Diet: Heart Healthy  , with feeding assistance and aspiration precautions.  For Heart failure patients - Check your Weight same time everyday, if you gain over 2 pounds, or you develop in leg swelling, experience more shortness of breath or chest pain, call your Primary MD immediately. Follow Cardiac Low Salt Diet and 1.5 lit/day fluid restriction.   On your next visit with your primary care physician please Get Medicines reviewed and adjusted.   Please request your Prim.MD to go over all Hospital Tests and Procedure/Radiological results at the follow up, please get all Hospital records sent to your Prim MD by signing hospital release before you go home.   If you experience worsening of your admission symptoms, develop shortness of breath, life threatening emergency, suicidal or homicidal thoughts you must seek medical attention immediately by calling 911 or calling your MD immediately  if symptoms less severe.  You Must read complete instructions/literature along with all the possible adverse reactions/side effects for all the Medicines you take and that have been prescribed to you. Take any new Medicines after you have completely understood and accpet all the possible adverse reactions/side effects.   Do not drive, operating heavy machinery, perform activities at heights, swimming or participation in water activities or provide baby sitting services if  your were admitted for syncope or siezures until you have seen by Primary MD or a Neurologist and advised to do so again.  Do not drive when taking Pain medications.    Do not take more than prescribed Pain, Sleep and Anxiety Medications  Special Instructions: If you have smoked or chewed Tobacco  in the last 2 yrs please stop smoking, stop any regular Alcohol  and or any Recreational drug use.  Wear Seat belts while driving.   Please note  You were cared for by a hospitalist during your hospital stay. If you have any questions about your discharge medications or the care you received while you were in the hospital after you are discharged, you can call the unit and asked to speak with the hospitalist on call if the hospitalist that took care of you is not available. Once you are discharged, your primary care physician will handle any further medical issues. Please note that NO REFILLS for any discharge medications will be authorized once you are discharged, as it is imperative that you return to your primary care physician (or establish a relationship with a primary care physician if you do not have one) for your aftercare needs so that they can reassess your need for medications and monitor your lab values.   Increase activity slowly    Complete by:  As directed       Medication List    TAKE these medications   acetaminophen 500 MG tablet Commonly known as:  TYLENOL Take 1,000 mg by mouth every 6 (six) hours as needed for moderate pain.   albuterol 108 (90 Base) MCG/ACT inhaler Commonly known as:  PROVENTIL HFA;VENTOLIN HFA Inhale 1-2 puffs into the lungs every 6 (six) hours as needed for wheezing or shortness of breath.   amLODipine 5 MG tablet Commonly known as:  NORVASC Take 1 tablet (5 mg total) by mouth daily.   aspirin 81 MG EC tablet Take 1 tablet (81 mg total) by mouth daily.   atorvastatin 80 MG tablet Commonly known as:  LIPITOR TAKE ONE TABLET BY MOUTH ONCE DAILY    bismuth-metronidazole-tetracycline 140-125-125 MG capsule Commonly known as:  PYLERA Take 3 capsules by mouth 4 (four) times daily -  before meals and at bedtime.   clopidogrel  75 MG tablet Commonly known as:  PLAVIX Take 1 tablet (75 mg total) by mouth daily with breakfast.   furosemide 40 MG tablet Commonly known as:  LASIX Take 1 tablet (40 mg total) by mouth daily. What changed:  when to take this   hydrALAZINE 50 MG tablet Commonly known as:  APRESOLINE Take 1 tablet (50 mg total) by mouth every 8 (eight) hours.   isosorbide mononitrate 120 MG 24 hr tablet Commonly known as:  IMDUR Take 1 tablet (120 mg total) by mouth daily.   lisinopril 20 MG tablet Commonly known as:  PRINIVIL,ZESTRIL Take 1 tablet (20 mg total) by mouth daily.   Metoprolol Tartrate 75 MG Tabs Take 75 mg by mouth 2 (two) times daily.   nicotine 14 mg/24hr patch Commonly known as:  NICODERM CQ - dosed in mg/24 hours Place 1 patch (14 mg total) onto the skin daily.   nitroGLYCERIN 0.4 MG SL tablet Commonly known as:  NITROSTAT Place 1 tablet (0.4 mg total) under the tongue every 5 (five) minutes as needed for chest pain.   pantoprazole 40 MG tablet Commonly known as:  PROTONIX Take 1 tablet (40 mg total) by mouth daily before breakfast.   potassium chloride 10 MEQ tablet Commonly known as:  K-DUR Please take 4 tablets oral daily for next 3 days, then continue to take 2 tablets of oral daily thereafter.   SPIRIVA RESPIMAT 2.5 MCG/ACT Aers Generic drug:  Tiotropium Bromide Monohydrate Inhale 1 puff into the lungs as needed.         Diet and Activity recommendation: See Discharge Instructions above   Consults obtained -  None   Major procedures and Radiology Reports - PLEASE review detailed and final reports for all details, in brief -      Dg Chest 2 View  Result Date: 10/24/2015 CLINICAL DATA:  Acute onset of generalized chest pain and shortness of breath. Initial encounter.  EXAM: CHEST  2 VIEW COMPARISON:  Chest radiograph performed 07/11/2015 FINDINGS: The lungs are well-aerated. Pulmonary vascularity is at the upper limits of normal. There is no evidence of focal opacification, pleural effusion or pneumothorax. The heart is borderline normal in size. The patient is status post median sternotomy, with evidence of prior CABG. No acute osseous abnormalities are seen. Clips are noted within the right upper quadrant, reflecting prior cholecystectomy. IMPRESSION: No acute cardiopulmonary process seen. Electronically Signed   By: Garald Balding M.D.   On: 10/24/2015 23:33    Micro Results     No results found for this or any previous visit (from the past 240 hour(s)).     Today   Subjective:   Amadeo Garnet today has no headache,no chest abdominal pain,no new weakness tingling or numbness, feels much better wants to go home today.   Objective:   Blood pressure (!) 133/44, pulse 62, temperature 98.3 F (36.8 C), temperature source Oral, resp. rate 18, height 5\' 2"  (1.575 m), weight 82.2 kg (181 lb 4.8 oz), SpO2 95 %.   Intake/Output Summary (Last 24 hours) at 10/26/15 1622 Last data filed at 10/26/15 1300  Gross per 24 hour  Intake              240 ml  Output                0 ml  Net              240 ml    Exam Awake Alert, Oriented x 3, No  new F.N deficits, Normal affect .AT,PERRAL Supple Neck,No JVD, No cervical lymphadenopathy appriciated.  Symmetrical Chest wall movement, Good air movement bilaterally, CTAB RRR,No Gallops,Rubs or new Murmurs, No Parasternal Heave +ve B.Sounds, Abd Soft, Non tender, No organomegaly appriciated, No rebound -guarding or rigidity. No Cyanosis, Clubbing or edema, No new Rash or bruise  Data Review   CBC w Diff: Lab Results  Component Value Date   WBC 8.6 10/25/2015   HGB 10.8 (L) 10/25/2015   HCT 34.4 (L) 10/25/2015   PLT 204 10/25/2015   LYMPHOPCT 26 08/22/2015   MONOPCT 12 08/22/2015   EOSPCT 2  08/22/2015   BASOPCT 1 08/22/2015    CMP: Lab Results  Component Value Date   NA 136 10/26/2015   K 3.5 10/26/2015   CL 106 10/26/2015   CO2 21 (L) 10/26/2015   BUN 8 10/26/2015   CREATININE 1.15 (H) 10/26/2015   CREATININE 0.89 06/29/2014   PROT 4.9 (L) 07/12/2015   ALBUMIN 2.8 (L) 07/12/2015   BILITOT 2.1 (H) 07/12/2015   ALKPHOS 36 (L) 07/12/2015   AST 16 07/12/2015   ALT 22 07/12/2015  .   Total Time in preparing paper work, data evaluation and todays exam - 35 minutes  Kristyl Athens M.D on 10/26/2015 at 4:22 PM  [facility]   Office  [phone]

## 2015-11-03 ENCOUNTER — Encounter: Payer: Self-pay | Admitting: Pulmonary Disease

## 2015-11-14 ENCOUNTER — Other Ambulatory Visit: Payer: Self-pay | Admitting: Physician Assistant

## 2015-11-22 ENCOUNTER — Other Ambulatory Visit: Payer: Self-pay | Admitting: Physician Assistant

## 2016-01-29 ENCOUNTER — Other Ambulatory Visit: Payer: Self-pay | Admitting: Physician Assistant

## 2016-02-12 ENCOUNTER — Other Ambulatory Visit: Payer: Self-pay | Admitting: Cardiology

## 2016-03-18 ENCOUNTER — Other Ambulatory Visit: Payer: Self-pay | Admitting: Cardiovascular Disease

## 2016-03-18 ENCOUNTER — Encounter: Payer: Self-pay | Admitting: Pulmonary Disease

## 2016-03-18 ENCOUNTER — Other Ambulatory Visit: Payer: Self-pay | Admitting: Cardiology

## 2016-04-11 IMAGING — DX DG CHEST 2V
2 series · 2 of 2 positions shown · non-contrast
Comparison: 07/21/2014

CLINICAL DATA: Productive cough, aching all over since last night.

EXAM:
CHEST  2 VIEW

[chest pa]
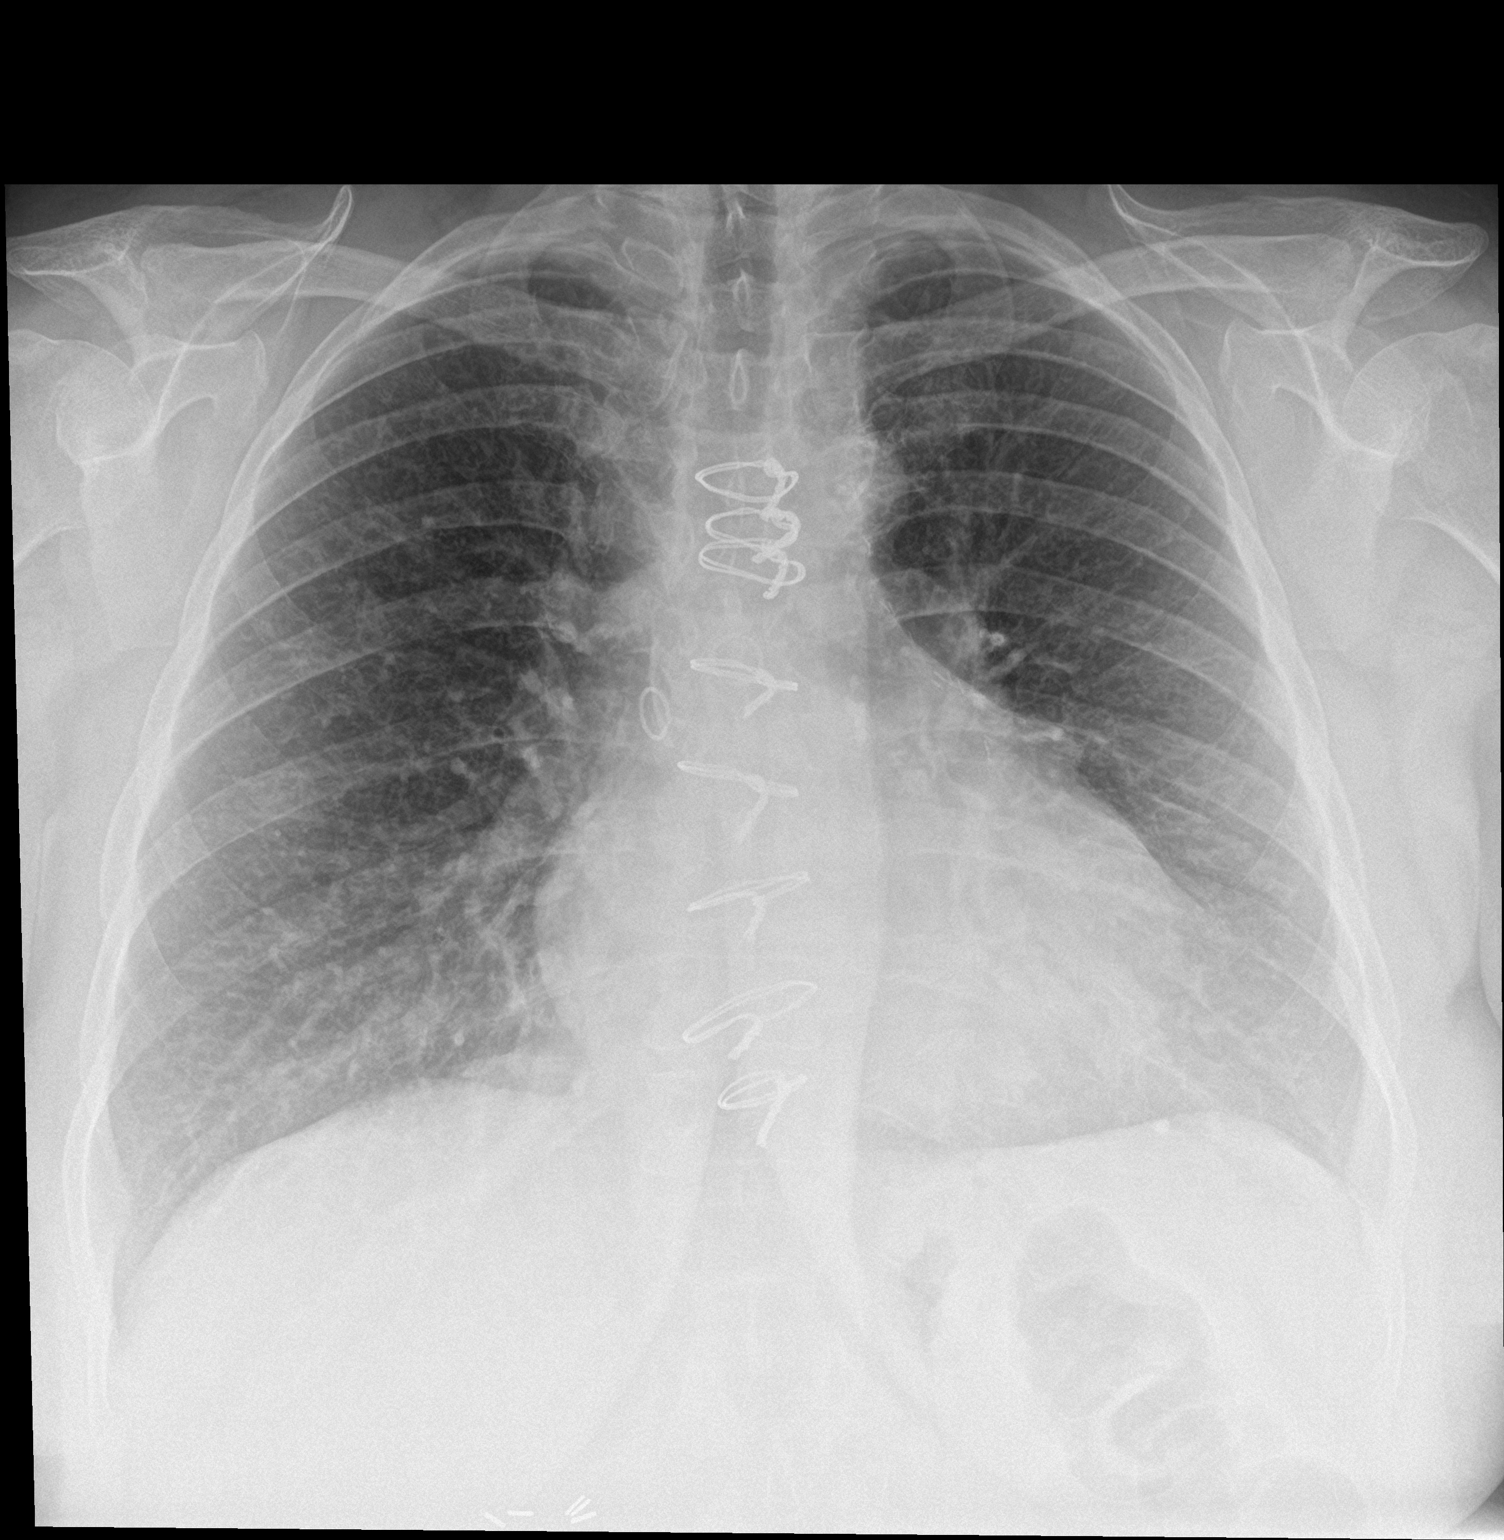

[chest lat]
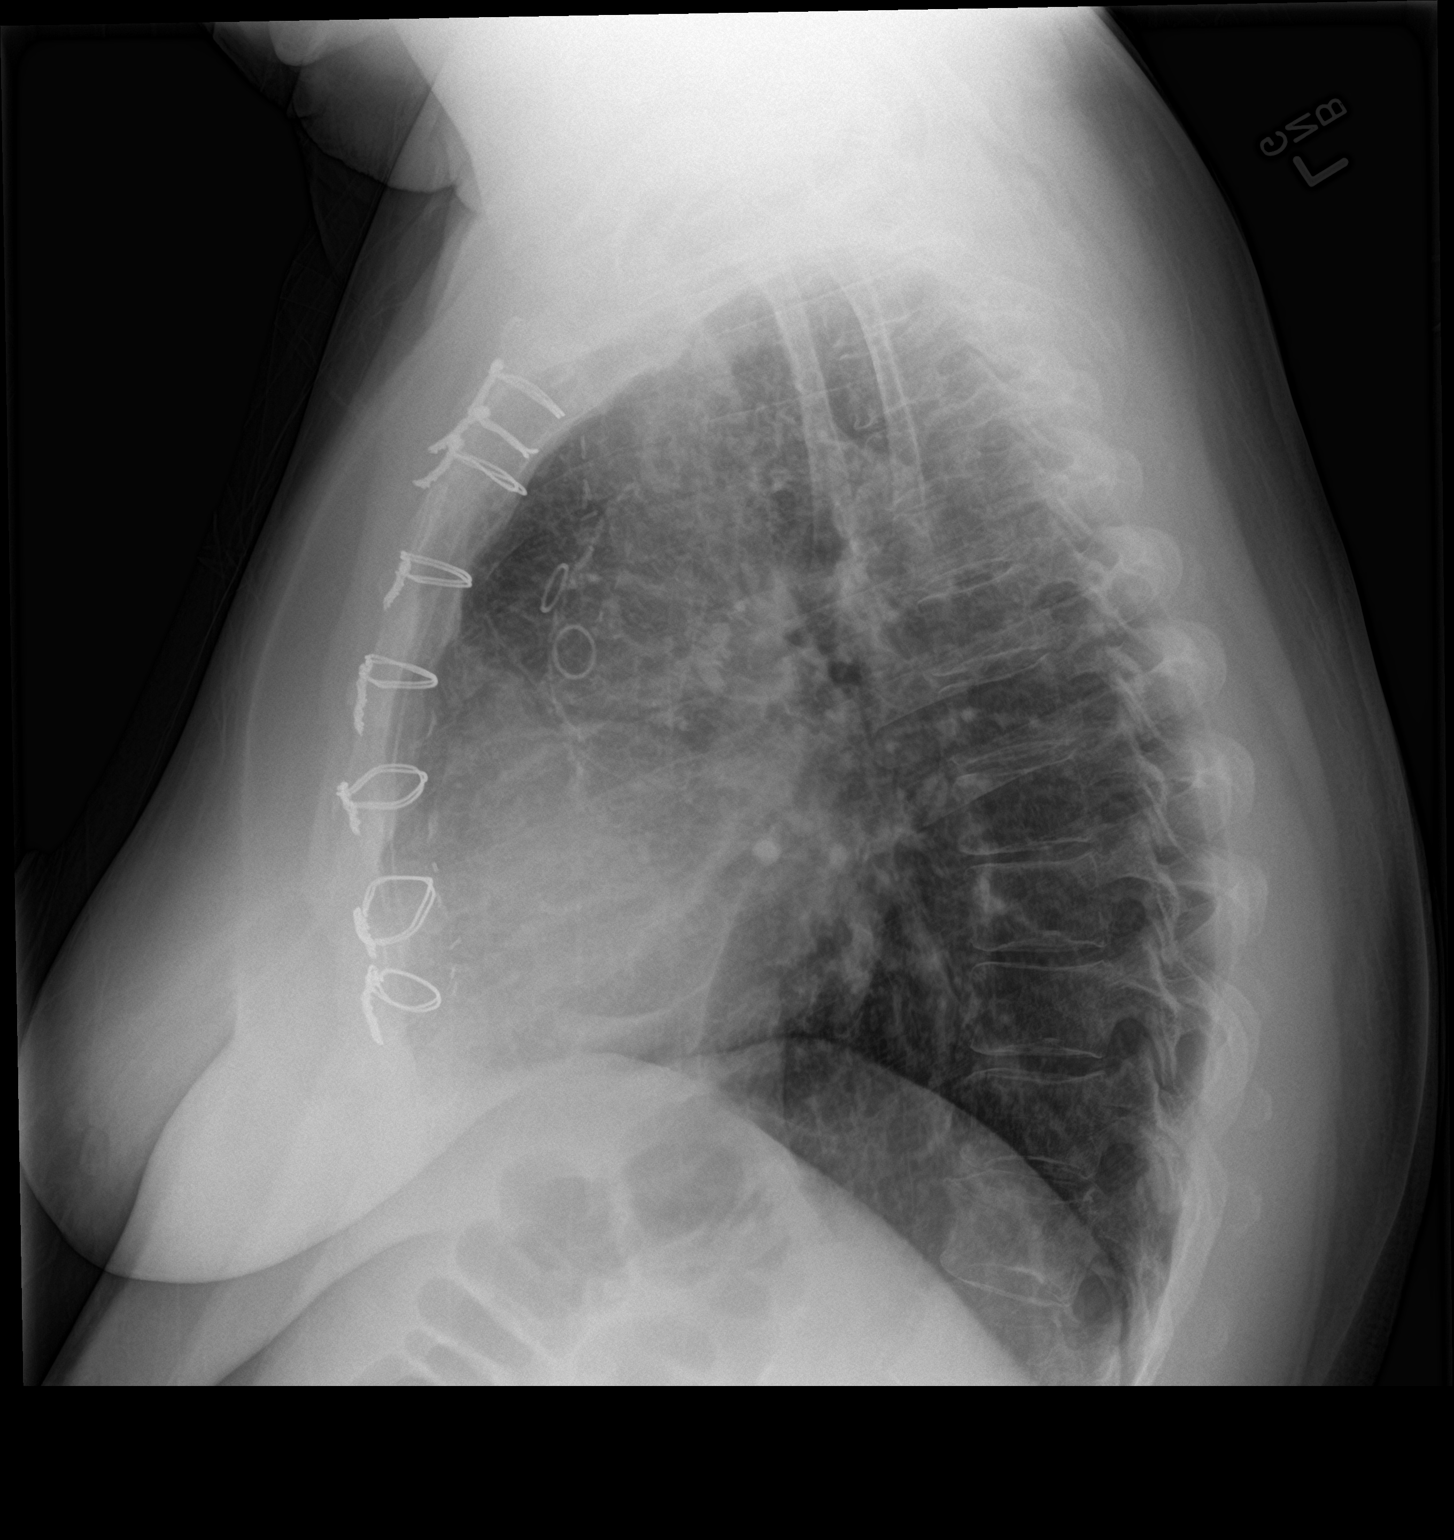

[2 of 2 positions shown; findings below may reference images not displayed]

FINDINGS: Prior CABG. Heart is borderline in size. Mild peribronchial
thickening and interstitial prominence. No confluent opacities or
effusions. No acute bony abnormality.
IMPRESSION: Mild bronchitic changes.

## 2016-04-15 ENCOUNTER — Other Ambulatory Visit: Payer: Self-pay | Admitting: Cardiology

## 2016-04-22 ENCOUNTER — Other Ambulatory Visit: Payer: Self-pay | Admitting: Cardiology

## 2016-04-22 NOTE — Telephone Encounter (Signed)
Attempted to reach pt to see which medication she needs refilled,phone just rang.

## 2016-04-22 NOTE — Telephone Encounter (Signed)
Patient scheduled her f/u / please call in her refills / tg

## 2016-04-25 MED ORDER — ATORVASTATIN CALCIUM 80 MG PO TABS: 80.0000 mg | ORAL_TABLET | Freq: Every day | ORAL | 1 refills | Status: DC

## 2016-04-25 NOTE — Addendum Note (Signed)
Addended by: Barbarann Ehlers A on: 04/25/2016 08:08 AM   Modules accepted: Orders

## 2016-04-25 NOTE — Telephone Encounter (Signed)
lipitor refilled,pt never responded back,i looked at med list and determined this was needed

## 2016-05-20 ENCOUNTER — Other Ambulatory Visit: Payer: Self-pay | Admitting: Physician Assistant

## 2016-05-27 ENCOUNTER — Emergency Department (HOSPITAL_COMMUNITY)
Admission: EM | Admit: 2016-05-27 | Discharge: 2016-05-27 | Disposition: A | Discharge status: Home / Self Care | Payer: Medicaid Other | Attending: Emergency Medicine | Admitting: Emergency Medicine

## 2016-05-27 ENCOUNTER — Encounter (HOSPITAL_COMMUNITY): Payer: Self-pay | Admitting: Emergency Medicine

## 2016-05-27 ENCOUNTER — Emergency Department (HOSPITAL_COMMUNITY): Payer: Medicaid Other

## 2016-05-27 DIAGNOSIS — Z79899 Other long term (current) drug therapy: Secondary | ICD-10-CM | POA: Insufficient documentation

## 2016-05-27 DIAGNOSIS — F17210 Nicotine dependence, cigarettes, uncomplicated: Secondary | ICD-10-CM | POA: Insufficient documentation

## 2016-05-27 DIAGNOSIS — I129 Hypertensive chronic kidney disease with stage 1 through stage 4 chronic kidney disease, or unspecified chronic kidney disease: Secondary | ICD-10-CM | POA: Diagnosis not present

## 2016-05-27 DIAGNOSIS — Z7982 Long term (current) use of aspirin: Secondary | ICD-10-CM | POA: Diagnosis not present

## 2016-05-27 DIAGNOSIS — Z8541 Personal history of malignant neoplasm of cervix uteri: Secondary | ICD-10-CM | POA: Insufficient documentation

## 2016-05-27 DIAGNOSIS — N183 Chronic kidney disease, stage 3 (moderate): Secondary | ICD-10-CM | POA: Insufficient documentation

## 2016-05-27 DIAGNOSIS — J441 Chronic obstructive pulmonary disease with (acute) exacerbation: Secondary | ICD-10-CM

## 2016-05-27 DIAGNOSIS — R05 Cough: Secondary | ICD-10-CM | POA: Diagnosis present

## 2016-05-27 DIAGNOSIS — I2510 Atherosclerotic heart disease of native coronary artery without angina pectoris: Secondary | ICD-10-CM | POA: Insufficient documentation

## 2016-05-27 DIAGNOSIS — Z951 Presence of aortocoronary bypass graft: Secondary | ICD-10-CM | POA: Diagnosis not present

## 2016-05-27 LAB — CBC WITH DIFFERENTIAL/PLATELET
Basophils Absolute: 0 10*3/uL (ref 0.0–0.1)
Basophils Relative: 0 %
Eosinophils Absolute: 0.3 10*3/uL (ref 0.0–0.7)
Eosinophils Relative: 2 %
HEMATOCRIT: 37.7 % (ref 36.0–46.0)
HEMOGLOBIN: 12.2 g/dL (ref 12.0–15.0)
LYMPHS ABS: 1.2 10*3/uL (ref 0.7–4.0)
LYMPHS PCT: 8 %
MCH: 26.1 pg (ref 26.0–34.0)
MCHC: 32.4 g/dL (ref 30.0–36.0)
MCV: 80.6 fL (ref 78.0–100.0)
MONO ABS: 0.9 10*3/uL (ref 0.1–1.0)
MONOS PCT: 6 %
NEUTROS ABS: 12 10*3/uL — AB (ref 1.7–7.7)
Neutrophils Relative %: 84 %
Platelets: 250 10*3/uL (ref 150–400)
RBC: 4.68 MIL/uL (ref 3.87–5.11)
RDW: 17.9 % — AB (ref 11.5–15.5)
WBC: 14.3 10*3/uL — ABNORMAL HIGH (ref 4.0–10.5)

## 2016-05-27 LAB — BASIC METABOLIC PANEL
Anion gap: 11 (ref 5–15)
BUN: 7 mg/dL (ref 6–20)
CALCIUM: 9.5 mg/dL (ref 8.9–10.3)
CO2: 24 mmol/L (ref 22–32)
CREATININE: 0.83 mg/dL (ref 0.44–1.00)
Chloride: 104 mmol/L (ref 101–111)
GFR calc Af Amer: >60 mL/min (ref >60)
GFR calc non Af Amer: >60 mL/min (ref >60)
GLUCOSE: 115 mg/dL — AB (ref 65–99)
Potassium: 3 mmol/L — ABNORMAL LOW (ref 3.5–5.1)
Sodium: 139 mmol/L (ref 135–145)

## 2016-05-27 LAB — TROPONIN I: Troponin I: <0.03 ng/mL (ref <0.03)

## 2016-05-27 MED ORDER — PREDNISONE 10 MG PO TABS
60.0000 mg | ORAL_TABLET | Freq: Once | ORAL | Status: AC
  Administered 2016-05-27: 60 mg via ORAL
  Filled 2016-05-27: qty 1

## 2016-05-27 MED ORDER — METOPROLOL TARTRATE 50 MG PO TABS
75.0000 mg | ORAL_TABLET | Freq: Once | ORAL | Status: AC
  Administered 2016-05-27: 15:00:00 75 mg via ORAL
  Filled 2016-05-27: qty 1

## 2016-05-27 MED ORDER — AZITHROMYCIN 250 MG PO TABS: 250.0000 mg | ORAL_TABLET | Freq: Every day | ORAL | 0 refills | Status: DC

## 2016-05-27 MED ORDER — PREDNISONE 20 MG PO TABS: 40.0000 mg | ORAL_TABLET | Freq: Every day | ORAL | 0 refills | Status: AC

## 2016-05-27 MED ORDER — ALBUTEROL SULFATE HFA 108 (90 BASE) MCG/ACT IN AERS: 1.0000 | INHALATION_SPRAY | Freq: Four times a day (QID) | RESPIRATORY_TRACT | 0 refills | Status: DC | PRN

## 2016-05-27 MED ORDER — LISINOPRIL 10 MG PO TABS
20.0000 mg | ORAL_TABLET | Freq: Every day | ORAL | Status: DC
  Administered 2016-05-27: 20 mg via ORAL
  Filled 2016-05-27: qty 2

## 2016-05-27 MED ORDER — HYDRALAZINE HCL 25 MG PO TABS
50.0000 mg | ORAL_TABLET | Freq: Once | ORAL | Status: AC
  Administered 2016-05-27: 50 mg via ORAL
  Filled 2016-05-27: qty 2

## 2016-05-27 MED ORDER — IPRATROPIUM-ALBUTEROL 0.5-2.5 (3) MG/3ML IN SOLN
3.0000 mL | Freq: Once | RESPIRATORY_TRACT | Status: AC
Start: 2016-05-27 — End: 2016-05-27
  Administered 2016-05-27: 3 mL via RESPIRATORY_TRACT
  Filled 2016-05-27: qty 3

## 2016-05-27 MED ORDER — IPRATROPIUM-ALBUTEROL 0.5-2.5 (3) MG/3ML IN SOLN
3.0000 mL | Freq: Once | RESPIRATORY_TRACT | Status: AC
  Administered 2016-05-27: 3 mL via RESPIRATORY_TRACT
  Filled 2016-05-27: qty 3

## 2016-05-27 NOTE — ED Provider Notes (Signed)
Emergency Department Provider Note   I have reviewed the triage vital signs and the nursing notes.   HISTORY  Chief Complaint Cough   HPI Christina Boyle is a 61 y.o. female with PMH of CAD, COPD, HTN, GERD, HLD presents to the ED for evaluation of SOB since yesterday. Patient states she has a history of COPD and it typically flares when she gets sick. She began having upper respiratory infection symptoms yesterday. She describes sore throat, mild headache, worsening productive cough. Her dyspnea is worse with exertion. She has some associated chest discomfort but mostly with coughing. Denies fever or shaking chills. No abdominal pain, nausea, vomiting, diarrhea. She tried her Spiriva this morning but did not try her albuterol. He also missed taking 3 of her blood pressure medications as of dyspnea. She denies any weakness or numbness. No modifying factors.No radiation of symptoms.    Past Medical History:  Diagnosis Date  . Bleeding ulcer 06/2015  . CAD (coronary artery disease)    Multivessel disease s/p CABG, DES SVG to OM  July 03 2015 (occluded SVG to PDA)  . Cervical cancer ([place])   . Chronic bronchitis   . Coarctation of aorta   . COPD (chronic obstructive pulmonary disease) ([place])   . Depression   . Essential hypertension   . GERD (gastroesophageal reflux disease)   . Heart murmur   . Hyperlipidemia   . NSTEMI (non-ST elevated myocardial infarction) [facility])    April 2017  . Obesity (BMI 30-39.9)     Patient Active Problem List   Diagnosis Date Noted  . Hypokalemia 10/25/2015  . CAD (coronary artery disease) 10/25/2015  . H. pylori infection 10/25/2015  . CKD (chronic kidney disease) stage 3, GFR 30-59 ml/min 07/14/2015  . Thrombocytopenia ([place]) 07/12/2015  . Hypovolemia 07/12/2015  . AKI (acute kidney injury) ([place]) 07/11/2015  . Acute upper GI bleed 07/11/2015  . Obesity (BMI 30-39.9) 06/30/2015  . Hx of CABG 2007 06/30/2015  . Hypertensive cardiovascular  disease 06/30/2015  . Aortic stenosis-mild to moderate 06/30/2015  . Coronary artery disease involving native heart with unstable angina pectoris ([place]) 06/30/2015  . NSTEMI (non-ST elevated myocardial infarction) ([place]) 06/29/2015  . COPD exacerbation ([place]) 06/27/2015  . Acute gastric ulcer with hemorrhage   . Elevated troponin   . Upper gastrointestinal bleed 07/21/2014  . Symptomatic anemia 07/21/2014  . Chest pain 07/21/2014  . Acute renal failure ([place]) 07/21/2014  . Azotemia 07/21/2014  . Abnormal urinalysis 07/21/2014  . COPD (chronic obstructive pulmonary disease) ([place]) 07/21/2014  . Acute blood loss anemia 07/21/2014  . Hypertension 07/31/2011  . Carotid bruit 07/31/2011  . CERVICAL CANCER 04/04/2009  . Hyperlipemia 04/04/2009  . TOBACCO ABUSE 04/04/2009  . COARCTATION OF AORTA 04/04/2009  . FATIGUE 04/04/2009    Past Surgical History:  Procedure Laterality Date  . BREAST BIOPSY Left    Benign  . CARDIAC CATHETERIZATION N/A 06/30/2015   Procedure: Left Heart Cath and Cors/Grafts Angiography;  Surgeon: Leonie Man, MD;  Location: [place] [facility];  Service: Cardiovascular;  Laterality: N/A;  . CARDIAC CATHETERIZATION N/A 07/03/2015   Procedure: Coronary Stent Intervention;  Surgeon: Peter M Martinique, MD;  Location: [place] [facility];  Service: Cardiovascular;  Laterality: N/A;  . CERVICAL CONE BIOPSY    . CHOLECYSTECTOMY    . CORONARY ARTERY BYPASS GRAFT  02/2006   LIMA to LAD, SVG to OM, SVG to PDA  . ESOPHAGOGASTRODUODENOSCOPY N/A 07/22/2014   Procedure: ESOPHAGOGASTRODUODENOSCOPY (EGD);  Surgeon:  [name], MD;  Location: [place];  Service: Endoscopy;  Laterality: N/A;  . ESOPHAGOGASTRODUODENOSCOPY N/A 07/12/2015   Procedure: ESOPHAGOGASTRODUODENOSCOPY (EGD);  Surgeon: Rogene Houston, MD;  [facility];  Service: Endoscopy;  Laterality: N/A;  . ESOPHAGOGASTRODUODENOSCOPY N/A 10/18/2015   Procedure: ESOPHAGOGASTRODUODENOSCOPY (EGD);  Surgeon: Rogene Houston, MD;  [facility];  Service: Endoscopy;  Laterality: N/A;  2:55    Current Outpatient Rx  . Order #: [id number] Class: Historical Med  . Order #: [id number] Class: Normal  . Order #: [id number] Class: No Print  . Order #: [id number] Class: Normal  . Order #: [id number] Class: Normal  . Order #: [id number] Class: Normal  . Order #: [id number] Class: No Print  . Order #: [id number] Class: Normal  . Order #: [id number] Class: Normal  . Order #: [id number] Class: Historical Med  . Order #: [id number] Class: Normal  . Order #: [id number] Class: Normal  . Order #: [id number] Class: Historical Med  . Order #: [id number] Class: Print  . Order #: [id number] Class: Print  . [START ON 05/28/2016] Order #: [id number] Class: Print    Allergies Patient has no known allergies.  Family History  Problem Relation Age of Onset  . Heart attack Father 63  . Lung cancer Mother 82    Social History Social History  Substance Use Topics  . Smoking status: Current Every Day Smoker    Packs/day: 1.00    Years: 50.00    Types: Cigarettes    Start date: 05/21/1969  . Smokeless tobacco: Never Used  . Alcohol use No    Review of Systems  Constitutional: No fever/chills Eyes: No visual changes. ENT: Mild sore throat. Cardiovascular: Positive chest pain with coughing.  Respiratory: Positive shortness of breath. Positive cough.  Gastrointestinal: No abdominal pain.  No nausea, no vomiting.  No diarrhea.  No constipation. Genitourinary: Negative for dysuria. Musculoskeletal: Negative for back pain. Skin: Negative for rash. Neurological: Negative for headaches, focal weakness or numbness.  10-point ROS otherwise negative.  ____________________________________________   PHYSICAL EXAM:  VITAL SIGNS: ED Triage Vitals [05/27/16 1045]  Enc Vitals Group     BP (!) 206/57     Pulse Rate 81     Resp (!) 24     Temp 97.8 F (36.6 C)     Temp Source Oral     SpO2 99 %   Constitutional: Alert and  oriented. Well appearing and in no acute distress. Eyes: Conjunctivae are normal Head: Atraumatic. Nose: No congestion/rhinnorhea. Mouth/Throat: Mucous membranes are moist.   Neck: No stridor. Cardiovascular: Normal rate, regular rhythm. Good peripheral circulation. Grossly normal heart sounds.   Respiratory: Increased respiratory effort.  No retractions. Lungs with diffuse expiratory wheezing bilaterally. Gastrointestinal: Soft and nontender. No distention.  Musculoskeletal: No lower extremity tenderness nor edema. No gross deformities of extremities. Neurologic:  Normal speech and language. No gross focal neurologic deficits are appreciated.  Skin:  Skin is warm, dry and intact. No rash noted. Psychiatric: Mood and affect are normal. Speech and behavior are normal.  ____________________________________________   LABS (all labs ordered are listed, but only abnormal results are displayed)  Labs Reviewed  CBC WITH DIFFERENTIAL/PLATELET - Abnormal; Notable for the following:       Result Value   WBC 14.3 (*)    RDW 17.9 (*)    Neutro Abs 12.0 (*)    All other components within normal limits  BASIC METABOLIC PANEL - Abnormal; Notable for the following:    Potassium 3.0 (*)    Glucose, Bld 115 (*)  All other components within normal limits  TROPONIN I   ____________________________________________  EKG   EKG Interpretation  Date/Time:  Monday May 27 2016 15:31:34 EDT Ventricular Rate:  85 PR Interval:    QRS Duration: 87 QT Interval:  357 QTC Calculation: 425 R Axis:   50 Text Interpretation:  Sinus rhythm Prolonged PR interval Consider left atrial enlargement Repol abnrm suggests ischemia, diffuse leads Baseline wander in lead(s) V1 No STEMI.  Confirmed by LONG MD, JOSHUA [phone]) on 05/27/2016 3:36:46 PM       ____________________________________________  RADIOLOGY  Dg Chest 2 View  Result Date: 05/27/2016 CLINICAL DATA:  Cough and shortness of breath EXAM:  CHEST  2 VIEW COMPARISON:  10/24/2015 FINDINGS: The heart size and mediastinal contours are within normal limits. Both lungs are clear. The visualized skeletal structures are unremarkable. IMPRESSION: No active cardiopulmonary disease. Electronically Signed   By: Kerby Moors M.D.   On: 05/27/2016 11:15    ____________________________________________   PROCEDURES  Procedure(s) performed:   Procedures  None ____________________________________________   INITIAL IMPRESSION / ASSESSMENT AND PLAN / ED COURSE  Pertinent labs & imaging results that were available during my care of the patient were reviewed by me and considered in my medical decision making (see chart for details).  Patient resents to the emergency department for evaluation of difficulty breathing the setting of URI symptoms. He should has underlying COPD. She did not take her blood pressure medication this morning which is leading to her significant elevated blood pressure. No evidence of flash pulmonary edema on chest x-ray or by exam. She has audible diffuse wheezing. Plan for treatment of COPD exacerbation. We'll also give home blood pressure medications.   04:00 PM Patient feeling better after first DuoNeb. Wheezing is decreased on repeat exam. We will discontinue oxygen now and reassess after second DuoNeb.   05:13 PM Patient is feeling better after second DuoNeb. She ambulated with a pulse ox with O2 sat 91-92%. No respiratory distress. Plan to discharge home with steroids, albuterol, azithromycin for presumed COPD exacerbation. She'll follow with her primary care physician to discuss optimization of her blood pressure medication along with her COPD medication.  At this time, I do not feel there is any life-threatening condition present. I have reviewed and discussed all results (EKG, imaging, lab, urine as appropriate), exam findings with patient. I have reviewed nursing notes and appropriate previous records.  I feel  the patient is safe to be discharged home without further emergent workup. Discussed usual and customary return precautions. Patient and family (if present) verbalize understanding and are comfortable with this plan.  Patient will follow-up with their primary care provider. If they do not have a primary care provider, information for follow-up has been provided to them. All questions have been answered.  ____________________________________________  FINAL CLINICAL IMPRESSION(S) / ED DIAGNOSES  Final diagnoses:  COPD exacerbation ([place])     MEDICATIONS GIVEN DURING THIS VISIT:  Medications  lisinopril (PRINIVIL,ZESTRIL) tablet 20 mg (20 mg Oral Given 05/27/16 1522)  hydrALAZINE (APRESOLINE) tablet 50 mg (50 mg Oral Given 05/27/16 1522)  metoprolol tartrate (LOPRESSOR) tablet 75 mg (75 mg Oral Given 05/27/16 1522)  ipratropium-albuterol (DUONEB) 0.5-2.5 (3) MG/3ML nebulizer solution 3 mL (3 mLs Nebulization Given 05/27/16 1544)  predniSONE (DELTASONE) tablet 60 mg (60 mg Oral Given 05/27/16 1522)  ipratropium-albuterol (DUONEB) 0.5-2.5 (3) MG/3ML nebulizer solution 3 mL (3 mLs Nebulization Given 05/27/16 1638)     NEW OUTPATIENT MEDICATIONS STARTED DURING THIS VISIT:  New Prescriptions  ALBUTEROL (PROVENTIL HFA;VENTOLIN HFA) 108 (90 BASE) MCG/ACT INHALER    Inhale 1-2 puffs into the lungs every 6 (six) hours as needed for wheezing or shortness of breath.   AZITHROMYCIN (ZITHROMAX) 250 MG TABLET    Take 1 tablet (250 mg total) by mouth daily. Take first 2 tablets together, then 1 every day until finished.   PREDNISONE (DELTASONE) 20 MG TABLET    Take 2 tablets (40 mg total) by mouth daily.      Note:  This document was prepared using Dragon voice recognition software and may include unintentional dictation errors.  Nanda Quinton, MD Emergency Medicine   Margette Fast, MD 05/27/16 [phone]

## 2016-05-27 NOTE — ED Triage Notes (Signed)
Pt reports sore throat since yesterday and reports cough today. Pt reports intermittent production. nad noted. Pt reports increased dyspnea with ambulation.

## 2016-05-27 NOTE — Discharge Instructions (Signed)
We believe that your symptoms are caused today by an exacerbation of your COPD, and possibly bronchitis.  Please take the prescribed medications and any medications that you have at home for your COPD.  Follow up with your doctor as recommended.  If you develop any new or worsening symptoms, including but not limited to fever, persistent vomiting, worsening shortness of breath, or other symptoms that concern you, please return to the Emergency Department immediately. ° ° °Chronic Obstructive Pulmonary Disease °Chronic obstructive pulmonary disease (COPD) is a common lung condition in which airflow from the lungs is limited. COPD is a general term that can be used to describe many different lung problems that limit airflow, including both chronic bronchitis and emphysema.  If you have COPD, your lung function will probably never return to normal, but there are measures you can take to improve lung function and make yourself feel better.  °CAUSES  °Smoking (common).   °Exposure to secondhand smoke.   °Genetic problems. °Chronic inflammatory lung diseases or recurrent infections. °SYMPTOMS  °Shortness of breath, especially with physical activity.   °Deep, persistent (chronic) cough with a large amount of thick mucus.   °Wheezing.   °Rapid breaths (tachypnea).   °Gray or bluish discoloration (cyanosis) of the skin, especially in fingers, toes, or lips.   °Fatigue.   °Weight loss.   °Frequent infections or episodes when breathing symptoms become much worse (exacerbations).   °Chest tightness. °DIAGNOSIS  °Your health care provider will take a medical history and perform a physical examination to make the initial diagnosis.  Additional tests for COPD may include:  °Lung (pulmonary) function tests. °Chest X-ray. °CT scan. °Blood tests. °TREATMENT  °Treatment available to help you feel better when you have COPD includes:  °Inhaler and nebulizer medicines. These help manage the symptoms of COPD and make your breathing more  comfortable. °Supplemental oxygen. Supplemental oxygen is only helpful if you have a low oxygen level in your blood.   °Exercise and physical activity. These are beneficial for nearly all people with COPD. Some people may also benefit from a pulmonary rehabilitation program. °HOME CARE INSTRUCTIONS  °Take all medicines (inhaled or pills) as directed by your health care provider. °Avoid over-the-counter medicines or cough syrups that dry up your airway (such as antihistamines) and slow down the elimination of secretions unless instructed otherwise by your health care provider.   °If you are a smoker, the most important thing that you can do is stop smoking. Continuing to smoke will cause further lung damage and breathing trouble. Ask your health care provider for help with quitting smoking. He or she can direct you to community resources or hospitals that provide support. °Avoid exposure to irritants such as smoke, chemicals, and fumes that aggravate your breathing. °Use oxygen therapy and pulmonary rehabilitation if directed by your health care provider. If you require home oxygen therapy, ask your health care provider whether you should purchase a pulse oximeter to measure your oxygen level at home.   °Avoid contact with individuals who have a contagious illness. °Avoid extreme temperature and humidity changes. °Eat healthy foods. Eating smaller, more frequent meals and resting before meals may help you maintain your strength. °Stay active, but balance activity with periods of rest. Exercise and physical activity will help you maintain your ability to do things you want to do. °Preventing infection and hospitalization is very important when you have COPD. Make sure to receive all the vaccines your health care provider recommends, especially the pneumococcal and influenza   vaccines. Ask your health care provider whether you need a pneumonia vaccine. °Learn and use relaxation techniques to manage stress. °Learn and  use controlled breathing techniques as directed by your health care provider. Controlled breathing techniques include:   °Pursed lip breathing. Start by breathing in (inhaling) through your nose for 1 second. Then, purse your lips as if you were going to whistle and breathe out (exhale) through the pursed lips for 2 seconds.   °Diaphragmatic breathing. Start by putting one hand on your abdomen just above your waist. Inhale slowly through your nose. The hand on your abdomen should move out. Then purse your lips and exhale slowly. You should be able to feel the hand on your abdomen moving in as you exhale.   °Learn and use controlled coughing to clear mucus from your lungs. Controlled coughing is a series of short, progressive coughs. The steps of controlled coughing are:   °Lean your head slightly forward.   °Breathe in deeply using diaphragmatic breathing.   °Try to hold your breath for 3 seconds.   °Keep your mouth slightly open while coughing twice.   °Spit any mucus out into a tissue.   °Rest and repeat the steps once or twice as needed. °SEEK MEDICAL CARE IF:  °You are coughing up more mucus than usual.   °There is a change in the color or thickness of your mucus.   °Your breathing is more labored than usual.   °Your breathing is faster than usual.   °SEEK IMMEDIATE MEDICAL CARE IF:  °You have shortness of breath while you are resting.   °You have shortness of breath that prevents you from: °Being able to talk.   °Performing your usual physical activities.   °You have chest pain lasting longer than 5 minutes.   °Your skin color is more cyanotic than usual. °You measure low oxygen saturations for longer than 5 minutes with a pulse oximeter. °MAKE SURE YOU:  °Understand these instructions. °Will watch your condition. °Will get help right away if you are not doing well or get worse. °Document Released: 12/05/2004 Document Revised: 07/12/2013 Document Reviewed: 10/22/2012 °[employer]® Patient Information ©2015  [employer]. This information is not intended to replace advice given to you by your health care provider. Make sure you discuss any questions you have with your health care provider. ° °

## 2016-05-27 NOTE — ED Notes (Signed)
Pt's O2 sat while ambulating 92-93% on RA. MD notified.

## 2016-05-27 NOTE — ED Notes (Signed)
insp and exp wheezing noted.

## 2016-05-27 NOTE — ED Notes (Signed)
Patient taken back to room and placed on 2LPM of o2 via [place]. Sats increased to 96%.

## 2016-06-04 ENCOUNTER — Emergency Department (HOSPITAL_COMMUNITY): Payer: Medicaid Other

## 2016-06-04 ENCOUNTER — Emergency Department (HOSPITAL_COMMUNITY)
Admission: EM | Admit: 2016-06-04 | Discharge: 2016-06-04 | Disposition: A | Discharge status: Home / Self Care | Payer: Medicaid Other | Attending: Emergency Medicine | Admitting: Emergency Medicine

## 2016-06-04 ENCOUNTER — Encounter (HOSPITAL_COMMUNITY): Payer: Self-pay | Admitting: Emergency Medicine

## 2016-06-04 DIAGNOSIS — F17210 Nicotine dependence, cigarettes, uncomplicated: Secondary | ICD-10-CM | POA: Diagnosis not present

## 2016-06-04 DIAGNOSIS — R197 Diarrhea, unspecified: Secondary | ICD-10-CM

## 2016-06-04 DIAGNOSIS — E876 Hypokalemia: Secondary | ICD-10-CM | POA: Insufficient documentation

## 2016-06-04 DIAGNOSIS — N183 Chronic kidney disease, stage 3 (moderate): Secondary | ICD-10-CM | POA: Diagnosis not present

## 2016-06-04 DIAGNOSIS — I2510 Atherosclerotic heart disease of native coronary artery without angina pectoris: Secondary | ICD-10-CM | POA: Diagnosis not present

## 2016-06-04 DIAGNOSIS — Z79899 Other long term (current) drug therapy: Secondary | ICD-10-CM | POA: Diagnosis not present

## 2016-06-04 DIAGNOSIS — J449 Chronic obstructive pulmonary disease, unspecified: Secondary | ICD-10-CM | POA: Insufficient documentation

## 2016-06-04 DIAGNOSIS — Z7982 Long term (current) use of aspirin: Secondary | ICD-10-CM | POA: Diagnosis not present

## 2016-06-04 DIAGNOSIS — R5383 Other fatigue: Secondary | ICD-10-CM | POA: Diagnosis present

## 2016-06-04 DIAGNOSIS — I129 Hypertensive chronic kidney disease with stage 1 through stage 4 chronic kidney disease, or unspecified chronic kidney disease: Secondary | ICD-10-CM | POA: Diagnosis not present

## 2016-06-04 LAB — BASIC METABOLIC PANEL
Anion gap: 10 (ref 5–15)
BUN: 12 mg/dL (ref 6–20)
CALCIUM: 9.6 mg/dL (ref 8.9–10.3)
CO2: 28 mmol/L (ref 22–32)
CREATININE: 0.92 mg/dL (ref 0.44–1.00)
Chloride: 99 mmol/L — ABNORMAL LOW (ref 101–111)
GFR calc Af Amer: >60 mL/min (ref >60)
Glucose, Bld: 122 mg/dL — ABNORMAL HIGH (ref 65–99)
Potassium: 2.9 mmol/L — ABNORMAL LOW (ref 3.5–5.1)
SODIUM: 137 mmol/L (ref 135–145)

## 2016-06-04 LAB — URINALYSIS, ROUTINE W REFLEX MICROSCOPIC
BILIRUBIN URINE: NEGATIVE
Glucose, UA: NEGATIVE mg/dL
Hgb urine dipstick: NEGATIVE
KETONES UR: NEGATIVE mg/dL
LEUKOCYTES UA: NEGATIVE
NITRITE: NEGATIVE
PROTEIN: NEGATIVE mg/dL
Specific Gravity, Urine: 1.009 (ref 1.005–1.030)
pH: 7 (ref 5.0–8.0)

## 2016-06-04 LAB — CBC
HCT: 40.7 % (ref 36.0–46.0)
Hemoglobin: 13.1 g/dL (ref 12.0–15.0)
MCH: 25.6 pg — ABNORMAL LOW (ref 26.0–34.0)
MCHC: 32.2 g/dL (ref 30.0–36.0)
MCV: 79.6 fL (ref 78.0–100.0)
PLATELETS: 308 10*3/uL (ref 150–400)
RBC: 5.11 MIL/uL (ref 3.87–5.11)
RDW: 17.3 % — AB (ref 11.5–15.5)
WBC: 13.6 10*3/uL — AB (ref 4.0–10.5)

## 2016-06-04 LAB — CBG MONITORING, ED: Glucose-Capillary: 125 mg/dL — ABNORMAL HIGH (ref 65–99)

## 2016-06-04 MED ORDER — SODIUM CHLORIDE 0.9 % IV BOLUS (SEPSIS)
500.0000 mL | Freq: Once | INTRAVENOUS | Status: AC
  Administered 2016-06-04: 500 mL via INTRAVENOUS

## 2016-06-04 MED ORDER — IPRATROPIUM-ALBUTEROL 0.5-2.5 (3) MG/3ML IN SOLN
3.0000 mL | Freq: Once | RESPIRATORY_TRACT | Status: AC
  Administered 2016-06-04: 3 mL via RESPIRATORY_TRACT
  Filled 2016-06-04: qty 3

## 2016-06-04 MED ORDER — POTASSIUM CHLORIDE CRYS ER 20 MEQ PO TBCR: 20.0000 meq | EXTENDED_RELEASE_TABLET | Freq: Two times a day (BID) | ORAL | 0 refills | Status: DC

## 2016-06-04 NOTE — ED Provider Notes (Signed)
[facility] DEPT Provider Note   CSN: [id number] Arrival date & time: 06/04/16  1503     History   Chief Complaint Chief Complaint  Patient presents with  . Fatigue    HPI Christina Boyle is a 61 y.o. female.  HPI Patient resents with fatigue. States that last week she was seen in the ER and diagnosed with bronchitis. She was on antibiotics. States that since then she has not felt any better neck she feels worse. States her coughing has improved. No fevers. States she now diarrhea. States she feels weak all over. Decreased appetite. States she's not been able eat.   Past Medical History:  Diagnosis Date  . Bleeding ulcer 06/2015  . CAD (coronary artery disease)    Multivessel disease s/p CABG, DES SVG to OM  July 03 2015 (occluded SVG to PDA)  . Cervical cancer ([facility])   . Chronic bronchitis   . Coarctation of aorta   . COPD (chronic obstructive pulmonary disease) ([place])   . Depression   . Essential hypertension   . GERD (gastroesophageal reflux disease)   . Heart murmur   . Hyperlipidemia   . NSTEMI (non-ST elevated myocardial infarction) [facility])    April 2017  . Obesity (BMI 30-39.9)     Patient Active Problem List   Diagnosis Date Noted  . Hypokalemia 10/25/2015  . CAD (coronary artery disease) 10/25/2015  . H. pylori infection 10/25/2015  . CKD (chronic kidney disease) stage 3, GFR 30-59 ml/min 07/14/2015  . Thrombocytopenia ([place]) 07/12/2015  . Hypovolemia 07/12/2015  . AKI (acute kidney injury) ([place]) 07/11/2015  . Acute upper GI bleed 07/11/2015  . Obesity (BMI 30-39.9) 06/30/2015  . Hx of CABG 2007 06/30/2015  . Hypertensive cardiovascular disease 06/30/2015  . Aortic stenosis-mild to moderate 06/30/2015  . Coronary artery disease involving native heart with unstable angina pectoris ([place]) 06/30/2015  . NSTEMI (non-ST elevated myocardial infarction) ([place]) 06/29/2015  . COPD exacerbation ([place]) 06/27/2015  . Acute gastric ulcer with hemorrhage   .  Elevated troponin   . Upper gastrointestinal bleed 07/21/2014  . Symptomatic anemia 07/21/2014  . Chest pain 07/21/2014  . Acute renal failure ([place]) 07/21/2014  . Azotemia 07/21/2014  . Abnormal urinalysis 07/21/2014  . COPD (chronic obstructive pulmonary disease) ([place]) 07/21/2014  . Acute blood loss anemia 07/21/2014  . Hypertension 07/31/2011  . Carotid bruit 07/31/2011  . CERVICAL CANCER 04/04/2009  . Hyperlipemia 04/04/2009  . TOBACCO ABUSE 04/04/2009  . COARCTATION OF AORTA 04/04/2009  . FATIGUE 04/04/2009    Past Surgical History:  Procedure Laterality Date  . BREAST BIOPSY Left    Benign  . CARDIAC CATHETERIZATION N/A 06/30/2015   Procedure: Left Heart Cath and Cors/Grafts Angiography;  Surgeon: Leonie Man, MD;  Location: [facility];  Service: Cardiovascular;  Laterality: N/A;  . CARDIAC CATHETERIZATION N/A 07/03/2015   Procedure: Coronary Stent Intervention;  Surgeon: Peter M Martinique, MD;  Location: [facility];  Service: Cardiovascular;  Laterality: N/A;  . CERVICAL CONE BIOPSY    . CHOLECYSTECTOMY    . CORONARY ARTERY BYPASS GRAFT  02/2006   LIMA to LAD, SVG to OM, SVG to PDA  . ESOPHAGOGASTRODUODENOSCOPY N/A 07/22/2014   Procedure: ESOPHAGOGASTRODUODENOSCOPY (EGD);  Surgeon: [name], MD;  Location: [facility] ENDOSCOPY;  Service: Endoscopy;  Laterality: N/A;  . ESOPHAGOGASTRODUODENOSCOPY N/A 07/12/2015   Procedure: ESOPHAGOGASTRODUODENOSCOPY (EGD);  Surgeon: Rogene Houston, MD;  Location: [facility];  Service: Endoscopy;  Laterality: N/A;  . ESOPHAGOGASTRODUODENOSCOPY  N/A 10/18/2015   Procedure: ESOPHAGOGASTRODUODENOSCOPY (EGD);  Surgeon: Rogene Houston, MD;  Location: [facility];  Service: Endoscopy;  Laterality: N/A;  2:55    OB History    No data available       Home Medications    Prior to Admission medications   Medication Sig Start Date End Date Taking? Authorizing Provider  acetaminophen (TYLENOL) 500 MG tablet Take 1,000 mg by  mouth every 6 (six) hours as needed for moderate pain.   Yes Historical Provider, MD  albuterol (PROVENTIL HFA;VENTOLIN HFA) 108 (90 Base) MCG/ACT inhaler Inhale 1-2 puffs into the lungs every 6 (six) hours as needed for wheezing or shortness of breath. 05/27/16  Yes Margette Fast, MD  amLODipine (NORVASC) 5 MG tablet TAKE ONE TABLET BY MOUTH ONCE DAILY 01/29/16  Yes [name], MD  aspirin EC 81 MG EC tablet Take 1 tablet (81 mg total) by mouth daily. 07/23/14  Yes [name], MD  atorvastatin (LIPITOR) 80 MG tablet Take 1 tablet (80 mg total) by mouth daily at 6 PM. 04/25/16  Yes [name], MD  clopidogrel (PLAVIX) 75 MG tablet Take 1 tablet (75 mg total) by mouth daily with breakfast. 07/04/15  Yes Bhavinkumar Bhagat, PA  furosemide (LASIX) 40 MG tablet Take 1 tablet (40 mg total) by mouth daily. 10/26/15  Yes Albertine Patricia, MD  hydrALAZINE (APRESOLINE) 50 MG tablet Take 1 tablet (50 mg total) by mouth every 8 (eight) hours. 07/04/15  Yes Bhavinkumar Bhagat, PA  isosorbide mononitrate (IMDUR) 120 MG 24 hr tablet TAKE ONE TABLET BY MOUTH ONCE DAILY 01/29/16  Yes [name], MD  Metoprolol Tartrate 75 MG TABS Take 75 mg by mouth 2 (two) times daily.   Yes Historical Provider, MD  nitroGLYCERIN (NITROSTAT) 0.4 MG SL tablet Place 1 tablet (0.4 mg total) under the tongue every 5 (five) minutes as needed for chest pain. 01/26/15  Yes [name], MD  pantoprazole (PROTONIX) 40 MG tablet Take 1 tablet (40 mg total) by mouth daily before breakfast. 10/18/15  Yes Rogene Houston, MD  Tiotropium Bromide Monohydrate (SPIRIVA RESPIMAT) 2.5 MCG/ACT AERS Inhale 1 puff into the lungs as needed.    Yes Historical Provider, MD  azithromycin (ZITHROMAX) 250 MG tablet Take 1 tablet (250 mg total) by mouth daily. Take first 2 tablets together, then 1 every day until finished. Patient not taking: Reported on 06/04/2016 05/27/16   Margette Fast, MD  potassium chloride SA (K-DUR,KLOR-CON) 20  MEQ tablet Take 1 tablet (20 mEq total) by mouth 2 (two) times daily. 06/04/16   Davonna Belling, MD    Family History Family History  Problem Relation Age of Onset  . Heart attack Father 9  . Lung cancer Mother 2    Social History Social History  Substance Use Topics  . Smoking status: Current Every Day Smoker    Packs/day: 1.00    Years: 50.00    Types: Cigarettes    Start date: 05/21/1969  . Smokeless tobacco: Never Used  . Alcohol use No     Allergies   Patient has no known allergies.   Review of Systems Review of Systems  Constitutional: Positive for appetite change and fatigue. Negative for fever.  HENT: Negative for congestion.   Respiratory: Positive for cough. Negative for shortness of breath.   Cardiovascular: Negative for chest pain.  Gastrointestinal: Positive for diarrhea. Negative for abdominal pain.  Genitourinary: Negative for flank pain.  Musculoskeletal: Negative  for back pain.  Neurological: Positive for light-headedness.  Hematological: Negative for adenopathy.  Psychiatric/Behavioral: Negative for confusion.     Physical Exam Updated Vital Signs BP (!) 162/59   Pulse 75   Temp 98.2 F (36.8 C)   Resp 18   Ht 5\' 2"  (1.575 m)   Wt 169 lb (76.7 kg)   SpO2 92%   BMI 30.91 kg/m   Physical Exam  Constitutional: She appears well-developed.  HENT:  Head: Normocephalic.  Eyes: Pupils are equal, round, and reactive to light.  Neck: Neck supple.  Cardiovascular: Normal rate.   Pulmonary/Chest:  Somewhat diffuse wheezes and prolonged expirations.  Abdominal: There is no tenderness.  Musculoskeletal: She exhibits no edema.  Neurological: She is alert.  Skin: Skin is warm. Capillary refill takes less than 2 seconds.  Psychiatric: She has a normal mood and affect.     ED Treatments / Results  Labs (all labs ordered are listed, but only abnormal results are displayed) Labs Reviewed  BASIC METABOLIC PANEL - Abnormal; Notable for the  following:       Result Value   Potassium 2.9 (*)    Chloride 99 (*)    Glucose, Bld 122 (*)    All other components within normal limits  CBC - Abnormal; Notable for the following:    WBC 13.6 (*)    MCH 25.6 (*)    RDW 17.3 (*)    All other components within normal limits  CBG MONITORING, ED - Abnormal; Notable for the following:    Glucose-Capillary 125 (*)    All other components within normal limits  URINALYSIS, ROUTINE W REFLEX MICROSCOPIC    EKG  EKG Interpretation  Date/Time:  Tuesday June 04 2016 15:26:35 EDT Ventricular Rate:  71 PR Interval:  200 QRS Duration: 96 QT Interval:  400 QTC Calculation: 434 R Axis:   47 Text Interpretation:  Normal sinus rhythm Nonspecific ST and T wave abnormality simmilar to prior Confirmed by Alvino Chapel  MD, [place] Febus [phone]) on 06/04/2016 3:34:33 PM       Radiology Dg Chest 2 View  Result Date: 06/04/2016 CLINICAL DATA:  Dyspnea EXAM: CHEST  2 VIEW COMPARISON:  05/27/2016 chest radiograph. FINDINGS: Stable configuration of median sternotomy wires. Cholecystectomy clips are seen in the right upper quadrant of the abdomen. Stable cardiomediastinal silhouette with normal heart size and aortic atherosclerosis. No pneumothorax. No pleural effusion. Lungs appear clear, with no acute consolidative airspace disease and no pulmonary edema. IMPRESSION: No active cardiopulmonary disease. Aortic atherosclerosis. Electronically Signed   By: Ilona Sorrel M.D.   On: 06/04/2016 18:51    Procedures Procedures (including critical care time)  Medications Ordered in ED Medications  sodium chloride 0.9 % bolus 500 mL (0 mLs Intravenous Stopped 06/04/16 1937)  ipratropium-albuterol (DUONEB) 0.5-2.5 (3) MG/3ML nebulizer solution 3 mL (3 mLs Nebulization Given 06/04/16 1912)     Initial Impression / Assessment and Plan / ED Course  I have reviewed the triage vital signs and the nursing notes.  Pertinent labs & imaging results that were available during  my care of the patient were reviewed by me and considered in my medical decision making (see chart for details).     Patient with fatigue. Has had diarrhea. Feels better after IV fluids. Mild hypokalemia but labs otherwise reassuring. X-ray does not show pneumonia. Will discharge home to follow-up with her primary care doctor as needed. Has tolerated orals here.  Final Clinical Impressions(s) / ED Diagnoses   Final diagnoses:  Hypokalemia  Diarrhea, unspecified type    New Prescriptions Discharge Medication List as of 06/04/2016  9:36 PM    START taking these medications   Details  potassium chloride SA (K-DUR,KLOR-CON) 20 MEQ tablet Take 1 tablet (20 mEq total) by mouth 2 (two) times daily., Starting Tue 06/04/2016, Print         Davonna Belling, MD 06/04/16 [phone]

## 2016-06-04 NOTE — ED Notes (Signed)
sats are 90-91% sitting in bed and pt reports some mild shortness of breath.  Placed on 2l o2 via [place]

## 2016-06-04 NOTE — Discharge Instructions (Signed)
Stop your lasix for 1 day.

## 2016-06-04 NOTE — ED Notes (Signed)
Pt ambulated around nurses station, sats were 92% on RA

## 2016-06-04 NOTE — ED Triage Notes (Signed)
Pt states she was seen in ED last week and dx with exacerbation of COPD and bronchitis. Pt states she has completed her prescriptions and is not feeling any better. Pt c/o decreased appetite, diarrhea and fatigue.

## 2016-06-18 ENCOUNTER — Ambulatory Visit (INDEPENDENT_AMBULATORY_CARE_PROVIDER_SITE_OTHER): Payer: Medicaid Other | Admitting: Cardiology

## 2016-06-18 ENCOUNTER — Encounter: Payer: Self-pay | Admitting: Cardiology

## 2016-06-18 VITALS — BP 156/70 | HR 73 | Ht 62.0 in | Wt 172.0 lb

## 2016-06-18 DIAGNOSIS — I2510 Atherosclerotic heart disease of native coronary artery without angina pectoris: Secondary | ICD-10-CM | POA: Diagnosis not present

## 2016-06-18 DIAGNOSIS — I350 Nonrheumatic aortic (valve) stenosis: Secondary | ICD-10-CM

## 2016-06-18 DIAGNOSIS — E782 Mixed hyperlipidemia: Secondary | ICD-10-CM

## 2016-06-18 DIAGNOSIS — Z79899 Other long term (current) drug therapy: Secondary | ICD-10-CM

## 2016-06-18 DIAGNOSIS — I10 Essential (primary) hypertension: Secondary | ICD-10-CM

## 2016-06-18 NOTE — Patient Instructions (Signed)
Medication Instructions:  Your physician recommends that you continue on your current medications as directed. Please refer to the Current Medication list given to you today.   Labwork:TODAY  BMET MAGNESIUM LIPID PROFILE  Testing/Procedures: PLEASE KEEP A BLOOD PRESSURE LOG FOR 1 WEEK AND CALL us WITH RESULTS     Follow-Up: Your physician has requested that you regularly monitor and record your blood pressure readings at home. Please use the same machine at the same time of day to check your readings and record them to bring to your follow-up visit.    Any Other Special Instructions Will Be Listed Below (If Applicable).     If you need a refill on your cardiac medications before your next appointment, please call your pharmacy.

## 2016-06-18 NOTE — Progress Notes (Signed)
Clinical Summary Christina Boyle is a 61 y.o.female seen today for follow up of the following medical problems.    1. CAD  - prior CABG in 2007 3 vessel (LIMA to LAD, SVG to OM, SVG to PDA) - Echo 12/2012 LVEF 60-65%  - 01/2014 exercise MPI: did not reach THR, exercise limited by SOB and chest pain. Converted to [employer]. Apical anterior and anterolateral ischemia, intermediate risk. LVEF 62% - 06/2015 cath received stent to SVG-OM suboptimal result. If continued symptoms consider PCI of RCA 06/2015 echo: LVEF 65-70%, no WMAs, grade I diastolic dysfunction, mild to moderate AS   - no recent chest pain. Some SOB with recent bronchitis. - compliant with meds.   2. HTN - compliant with meds - does not check at home.   3. Hyperlipidemia - she is compliant with statin.   4.Aortic stenosis - echo 07/2014 LVEF 55-60%, mean grad 15 AVA VTI 1.36. Overall mild to moderate AS.  - echo 06/2015 LVEF 65-70%, mild to mod AS mean grad 14 AVA VTI 1.5   - no recent symptoms.    5. Carotid stenosis - carotid US 01/2015 with bilateral 1-39% disease. Repeat 01/2017 - no recent symptoms     SH: enjoys spending time outside, planting flowers.    Past Medical History:  Diagnosis Date  . Bleeding ulcer 06/2015  . CAD (coronary artery disease)    Multivessel disease s/p CABG, DES SVG to OM  July 03 2015 (occluded SVG to PDA)  . Cervical cancer ([place])   . Chronic bronchitis   . Coarctation of aorta   . COPD (chronic obstructive pulmonary disease) ([place])   . Depression   . Essential hypertension   . GERD (gastroesophageal reflux disease)   . Heart murmur   . Hyperlipidemia   . NSTEMI (non-ST elevated myocardial infarction) [facility])    April 2017  . Obesity (BMI 30-39.9)      No Known Allergies   Current Outpatient Prescriptions  Medication Sig Dispense Refill  . acetaminophen (TYLENOL) 500 MG tablet Take 1,000 mg by mouth every 6 (six) hours as needed for moderate pain.    Marland Kitchen  albuterol (PROVENTIL HFA;VENTOLIN HFA) 108 (90 Base) MCG/ACT inhaler Inhale 1-2 puffs into the lungs every 6 (six) hours as needed for wheezing or shortness of breath. 1 Inhaler 0  . amLODipine (NORVASC) 5 MG tablet TAKE ONE TABLET BY MOUTH ONCE DAILY 90 tablet 3  . aspirin EC 81 MG EC tablet Take 1 tablet (81 mg total) by mouth daily.    Marland Kitchen atorvastatin (LIPITOR) 80 MG tablet Take 1 tablet (80 mg total) by mouth daily at 6 PM. 90 tablet 1  . azithromycin (ZITHROMAX) 250 MG tablet Take 1 tablet (250 mg total) by mouth daily. Take first 2 tablets together, then 1 every day until finished. (Patient not taking: Reported on 06/04/2016) 6 tablet 0  . clopidogrel (PLAVIX) 75 MG tablet Take 1 tablet (75 mg total) by mouth daily with breakfast. 30 tablet 11  . furosemide (LASIX) 40 MG tablet Take 1 tablet (40 mg total) by mouth daily. 60 tablet 3  . hydrALAZINE (APRESOLINE) 50 MG tablet Take 1 tablet (50 mg total) by mouth every 8 (eight) hours. 90 tablet 6  . isosorbide mononitrate (IMDUR) 120 MG 24 hr tablet TAKE ONE TABLET BY MOUTH ONCE DAILY 30 tablet 6  . Metoprolol Tartrate 75 MG TABS Take 75 mg by mouth 2 (two) times daily.    . nitroGLYCERIN (NITROSTAT) 0.4  MG SL tablet Place 1 tablet (0.4 mg total) under the tongue every 5 (five) minutes as needed for chest pain. 25 tablet 3  . pantoprazole (PROTONIX) 40 MG tablet Take 1 tablet (40 mg total) by mouth daily before breakfast. 30 tablet 11  . potassium chloride SA (K-DUR,KLOR-CON) 20 MEQ tablet Take 1 tablet (20 mEq total) by mouth 2 (two) times daily. 8 tablet 0  . Tiotropium Bromide Monohydrate (SPIRIVA RESPIMAT) 2.5 MCG/ACT AERS Inhale 1 puff into the lungs as needed.      No current facility-administered medications for this visit.      Past Surgical History:  Procedure Laterality Date  . BREAST BIOPSY Left    Benign  . CARDIAC CATHETERIZATION N/A 06/30/2015   Procedure: Left Heart Cath and Cors/Grafts Angiography;  Surgeon: Leonie Man,  MD;  Location: [place] [facility];  Service: Cardiovascular;  Laterality: N/A;  . CARDIAC CATHETERIZATION N/A 07/03/2015   Procedure: Coronary Stent Intervention;  Surgeon: Peter M Martinique, MD;  Location: [place] [facility];  Service: Cardiovascular;  Laterality: N/A;  . CERVICAL CONE BIOPSY    . CHOLECYSTECTOMY    . CORONARY ARTERY BYPASS GRAFT  02/2006   LIMA to LAD, SVG to OM, SVG to PDA  . ESOPHAGOGASTRODUODENOSCOPY N/A 07/22/2014   Procedure: ESOPHAGOGASTRODUODENOSCOPY (EGD);  Surgeon: [name], MD;  Location[facility] END[facility]COPY;  Service: Endoscopy;  Laterality: N/A;  . ESOPHAGOGASTRODUODENOSCOPY N/A 07/12/2015   Procedure: ESOPHAGOGASTRODUODENOSCOPY (EGD);  Surgeon: Rogene Houston, MD;  Location: [facility];  Service: Endoscopy;  Laterality: N/A;  . ESOPHAGOGASTRODUODENOSCOPY N/A 10/18/2015   Procedure: ESOPHAGOGASTRODUODENOSCOPY (EGD);  Surgeon: Rogene Houston, MD;  Location: [facility] SU[facility];  Service: Endoscopy;  Laterality: N/A;  2:55     No Known Allergies    Family History  Problem Relation Age of Onset  . Heart attack Father 39  . Lung cancer Mother 73     Social History Christina Boyle reports that she has been smoking Cigarettes.  She started smoking about 47 years ago. She has a 50.00 pack-year smoking history. She has never used smokeless tobacco. Christina Boyle reports that she does not drink alcohol.   Review of Systems CONSTITUTIONAL: No weight loss, fever, chills, weakness or fatigue.  HEENT: Eyes: No visual loss, blurred vision, double vision or yellow sclerae.No hearing loss, sneezing, congestion, runny nose or sore throat.  SKIN: No rash or itching.  CARDIOVASCULAR: per hpi RESPIRATORY: No shortness of breath, cough or sputum.  GASTROINTESTINAL: No anorexia, nausea, vomiting or diarrhea. No abdominal pain or blood.  GENITOURINARY: No burning on urination, no polyuria NEUROLOGICAL: No headache, dizziness, syncope, paralysis, ataxia, numbness or tingling in the  extremities. No change in bowel or bladder control.  MUSCULOSKELETAL: No muscle, back pain, joint pain or stiffness.  LYMPHATICS: No enlarged nodes. No history of splenectomy.  PSYCHIATRIC: No history of depression or anxiety.  ENDOCRINOLOGIC: No reports of sweating, cold or heat intolerance. No polyuria or polydipsia.  Marland Kitchen   Physical Examination Vitals:   06/18/16 1048  BP: (!) 156/70  Pulse: 73   Vitals:   06/18/16 1048  Weight: 172 lb (78 kg)  Height: 5\' 2"  (1.575 m)    Gen: resting comfortably, no acute distress HEENT: no scleral icterus, pupils equal round and reactive, no palptable cervical adenopathy,  CV: RRR, 3/6 systolic murmur RUSB, no jvd Resp: Clear to auscultation bilaterally GI: abdomen is soft, non-tender, non-distended, normal bowel sounds, no hepatosplenomegaly MSK: extremities are warm, no edema.  Skin: warm, no rash Neuro:  no focal deficits Psych: appropriate affect   Diagnostic Studies 12/2012 Echo  LVEF 60-65%, mild to mod LVH, normal diastolic function, mild AS (valve area 1.5 VTI, mean grad 10), RV function mildly reduced   07/2011 Carotid US  1. Bilateral carotid bifurcation and proximal ICA plaque, resulting in less than 50% diameter stenosis. The exam does not exclude plaque ulceration or embolization. Continued surveillance recommended.  02/2006 Cath  FINDINGS:  1. LV: 143/17/26. EF 65% without regional wall motion abnormality.  2. No aortic stenosis or mitral regurgitation.  3. Left main: There is an 80% stenosis of the mid vessel.  4. LAD: Moderate-size vessel giving rise to two small proximal  diagonals and a larger third diagonal. The LAD has heavy  calcification proximally and diffuse mild disease. The third  diagonal has a 70% stenosis proximally. A small medial subdivision  of this has a 99% stenosis.  5. Circumflex: Moderate-sized vessel giving rise to a single  marginal. There is a 70% stenosis in the AV groove  portion of the  vessel.  6. RCA: Moderate-size, dominant vessel. There is an 80% ostial  stenosis, a 40% stenosis of the mid vessel, and a 90% stenosis just  before the origin of the PDA.  7. Left subclavian artery: Diffuse 30% stenosis proximally. The LIMA  is widely patent.  8. Aortic coarctation with 30 mmHg translesional gradient.  9. Normal great vessel anatomy. There is mild ostial stenosis of the  left common carotid artery.  IMPRESSION/RECOMMENDATIONS:  1. Severe multivessel coronary disease.  2. Coarctation of aorta.  3. Normal left ventricular systolic function.  Will refer the to cardiac surgery for consideration of coronary artery  bypass grafting and repair of her coarctation.    Jan 2015 PFTs Reason for pulmonary function testing is shortness of breath.  1. Spirometry shows a moderate ventilatory defect with minimal airflow  obstruction.  2. Lung volumes show air trapping.  3. DLCO is normal.  4. Airway resistance is high confirming the presence of airflow  obstruction.  5. No significant bronchodilator improvement.  6. Arterial blood gas shows relative resting hypoxia.  7. This study is consistent with COPD considering the patient's  smoking history.   10/2013 Carotid US IMPRESSION: 1. Slight interval progression of left-sided heterogeneous atherosclerotic plaque which now results in an estimated 50- 69% diameter ICA stenosis. 2. Stable heterogeneous atherosclerotic plaque on the right resulting in less than 50% diameter ICA stenosis. 3. Vertebral arteries are patent with normal antegrade flow.  01/2014 Lexiscan MPI IMPRESSION: 1. Abnormal study. There is evidence of apical anterior/anterolateral ischemia. Borderline TID ratio increased at 1.2 may also be indicative of subendocardial or balanced ischemia.  2. Normal left ventricular wall motion.  3. Left ventricular ejection fraction 62%  4. Intermediate-risk stress test  findings*.   07/2014 echo Study Conclusions  - Left ventricle: The cavity size was normal. Wall thickness was increased in a pattern of mild LVH. Systolic function was normal. The estimated ejection fraction was in the range of 55% to 60%. Wall motion was normal; there were no regional wall motion abnormalities. Doppler parameters are consistent with elevated ventricular end-diastolic filling pressure. - Aortic valve: Valve area (VTI): 1.36 cm^2. Valve area (Vmax): 1.26 cm^2. Valve area (Vmean): 1.29 cm^2. - Mitral valve: There was mild regurgitation. Valve area by continuity equation (using LVOT flow): 2.15 cm^2. - Left atrium: The atrium was mildly dilated. - Atrial septum: No defect or patent foramen ovale was  identified.  06/2015 cath 1. Ost RCA lesion, 95% stenosed. Mid RCA lesion, 50% stenosed. Dist RCA lesion, 70% stenosed. Post Atrio lesion, 80% stenosed. 2. 100% occluded SVG-RPDA at the origin 3. Ost LM to LM lesion, 60% stenosed. 4. Likely ostial left main disease but with brisk antegrade flow in the LAD system. Most notable lesion is the diagonal lesion. 5. 3rd Diag lesion, 80% stenosed. 6. Ost Cx to Prox Cx lesion, 80% stenosed. Ost 1st Mrg to 1st Mrg lesion, 80% stenosed. 7. SVG-OM is widely patent with the exception of a 99% stenosis in the origin 8. LIMA was visualized by non-selective angiography due to inability to cannulate and . Significant disease in that portion of the left subclavian artery.    The patient truly has 2 culprit lesions: Ostial SVG-OM and the occluded SVG-RCA with now ostial RCA disease. Both lesions are likely be difficult PCI. As the diagnostic procedure was somewhat difficult as far as engaging the LIMA graft, and significant amount of contrast was used with recent renal insufficiency, I felt it best to stop the procedure today with plans for having the patient return for 2 site PCI early next week.  Plan:  Patient will return  to nursing unit for continued medical care.  Would start heparin back 6-8 hours following sheath removal.  I have loaded with Plavix 300 mg and ordered 75 mg daily.  She needs aggressive blood pressure management.  06/2015 PCI  Origin lesion, 99% stenosed. Post intervention, there is a 20% residual stenosis.   1. Successful but suboptimal stenting of the ostial SVG to the OM due to difficulty positioning the stent at the origin. As a result the stent was deployed but only the distal portion of the stent was in the SVG.   Plan: continue DAPT indefinitely. Would postpone any further PCI and allow stent to re- endothelialize. If she does have refractory symptoms I would consider her for PCI of the native RCA with rotational atherectomy at a later date. If asymptomatic I would treat this medically.   Assessment and Plan  1. CAD  - no recent symptoms - we will continue current meds  2. HTN - elevated in clinic. She will submit bp log in 1 week, med changes based on results.   3. Hyperlipidemia - we will repeat lipid panel - she will continue current statin  4. Carotid bruits - mild stenosis by recent US, we will repeat study in 01/2017  5. Aortic stenosis - no curernt symptoms - we will continue to monitor.   F/u 6 months      [name], M.D., F.A.C.C.

## 2016-06-20 LAB — BASIC METABOLIC PANEL
BUN: 10 mg/dL (ref 7–25)
CO2: 28 mmol/L (ref 20–31)
Calcium: 9.7 mg/dL (ref 8.6–10.4)
Chloride: 102 mmol/L (ref 98–110)
Creat: 0.92 mg/dL (ref 0.50–0.99)
Glucose, Bld: 111 mg/dL — ABNORMAL HIGH (ref 65–99)
Potassium: 3.5 mmol/L (ref 3.5–5.3)
Sodium: 138 mmol/L (ref 135–146)

## 2016-06-20 LAB — LIPID PANEL
CHOLESTEROL: 234 mg/dL — AB (ref <200)
HDL: 47 mg/dL — ABNORMAL LOW (ref >50)
LDL CALC: 157 mg/dL — AB (ref <100)
Total CHOL/HDL Ratio: 5 Ratio — ABNORMAL HIGH (ref <5.0)
Triglycerides: 152 mg/dL — ABNORMAL HIGH (ref <150)
VLDL: 30 mg/dL (ref <30)

## 2016-06-20 LAB — MAGNESIUM: Magnesium: 2 mg/dL (ref 1.5–2.5)

## 2016-07-01 ENCOUNTER — Telehealth: Payer: Self-pay

## 2016-07-01 MED ORDER — ROSUVASTATIN CALCIUM 40 MG PO TABS: 40.0000 mg | ORAL_TABLET | Freq: Every day | ORAL | 3 refills | Status: DC

## 2016-07-01 NOTE — Telephone Encounter (Signed)
Had to mail pt letter. No working phone number. Sent in rx for crestor 40 mg daily.

## 2016-07-01 NOTE — Telephone Encounter (Signed)
-----   Message from [name], MD sent at 07/01/2016 10:23 AM EDT ----- Choletsterol is too high. Verifh she is taking atorvastatin 80mg  every day, and if so we will need to d/c and start crestor 40mg  daily  J BrancH MD

## 2016-07-07 ENCOUNTER — Other Ambulatory Visit: Payer: Self-pay | Admitting: Physician Assistant

## 2016-07-14 ENCOUNTER — Other Ambulatory Visit: Payer: Self-pay | Admitting: Cardiovascular Disease

## 2016-08-20 IMAGING — CR DG CHEST 1V PORT
1 series · 1 of 1 positions shown · non-contrast
Comparison: July 01, 2015

CLINICAL DATA: Unresponsive today.

EXAM:
PORTABLE CHEST 1 VIEW

[ap]
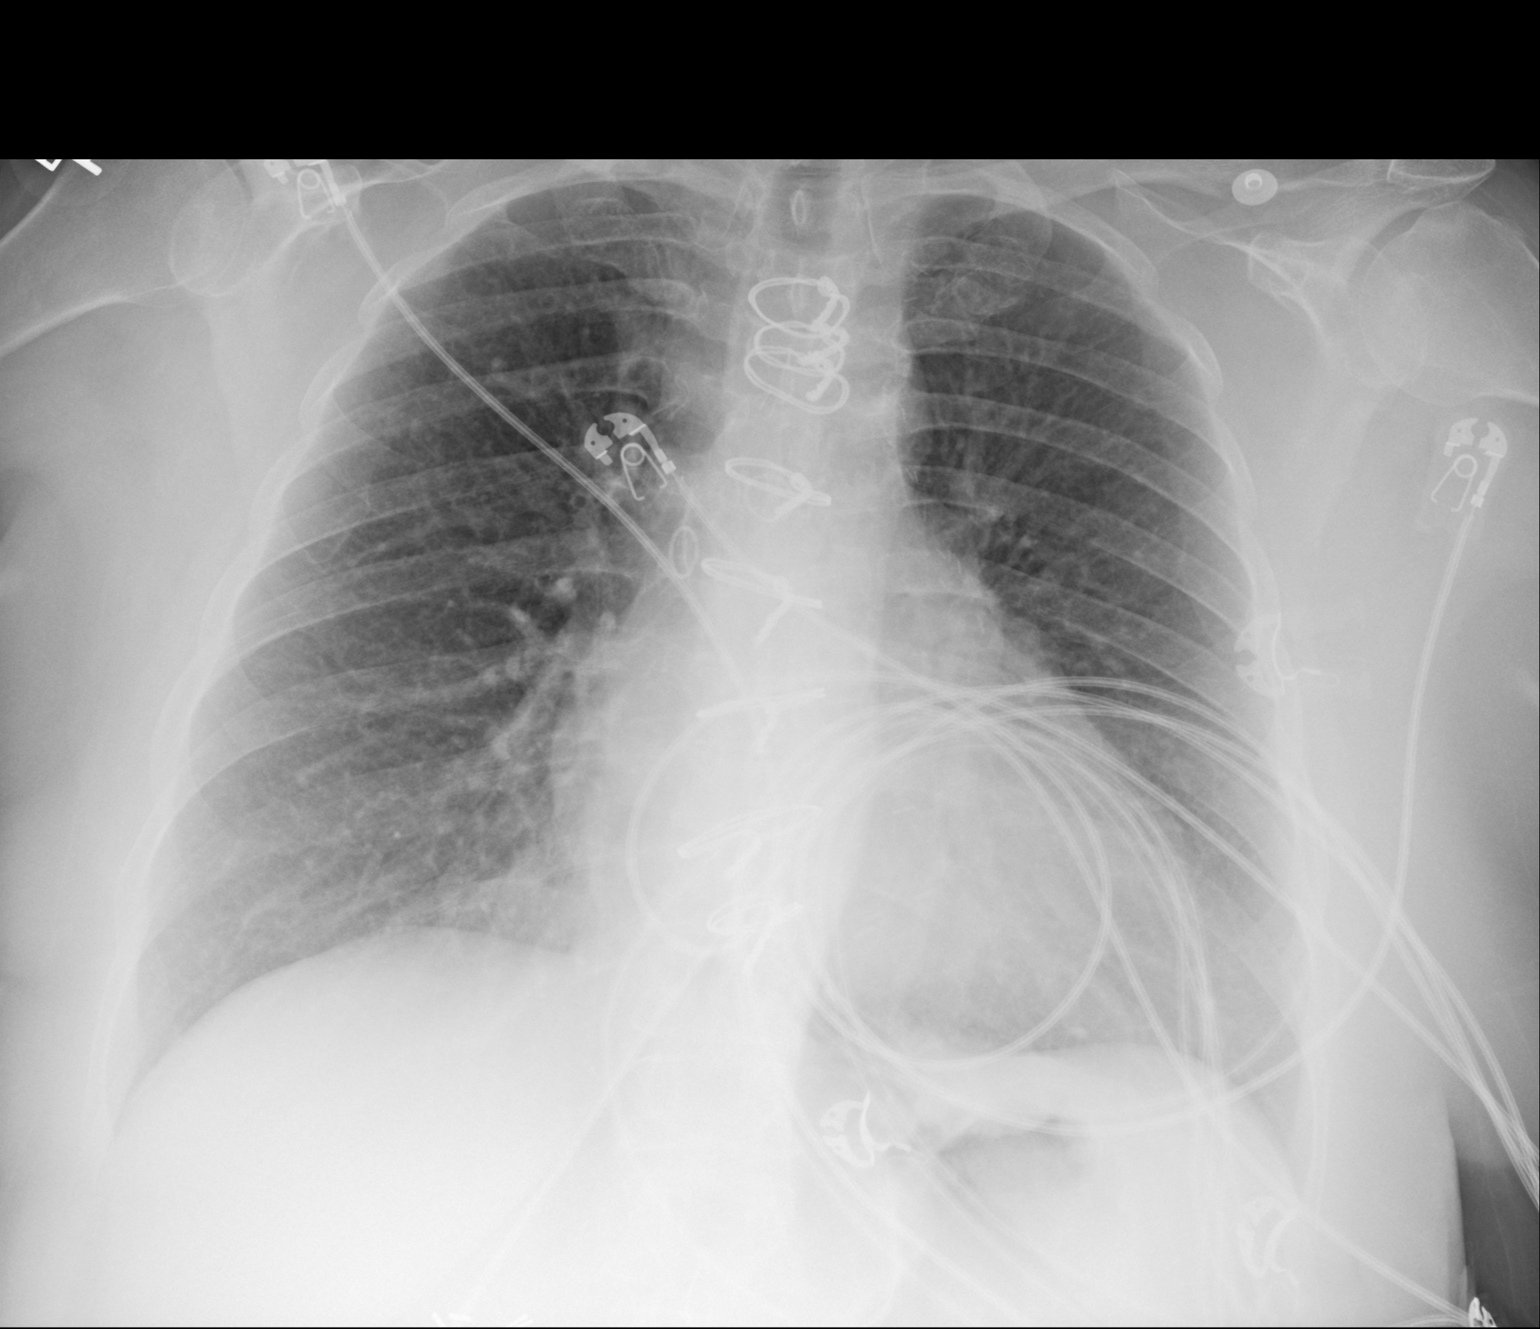

[1 of 1 positions shown; findings below may reference images not displayed]

FINDINGS: The heart size and mediastinal contours are stable. The heart size
is mildly enlarged. Both lungs are clear. The visualized skeletal
structures are unremarkable.
IMPRESSION: No active cardiopulmonary disease.

## 2016-08-21 ENCOUNTER — Other Ambulatory Visit: Payer: Self-pay | Admitting: Cardiology

## 2016-10-26 ENCOUNTER — Other Ambulatory Visit (INDEPENDENT_AMBULATORY_CARE_PROVIDER_SITE_OTHER): Payer: Self-pay | Admitting: Internal Medicine

## 2016-10-28 NOTE — Telephone Encounter (Signed)
Patient will need to have a OV prior to further refills. 

## 2016-11-12 ENCOUNTER — Other Ambulatory Visit: Payer: Self-pay | Admitting: Cardiovascular Disease

## 2016-11-13 ENCOUNTER — Encounter (INDEPENDENT_AMBULATORY_CARE_PROVIDER_SITE_OTHER): Payer: Self-pay | Admitting: Internal Medicine

## 2016-11-13 NOTE — Telephone Encounter (Signed)
An appointment was given to the patient for 03/12/17 at 11:30am.  A letter was mailed to the patient.

## 2016-12-25 ENCOUNTER — Ambulatory Visit (INDEPENDENT_AMBULATORY_CARE_PROVIDER_SITE_OTHER): Payer: Medicaid Other | Admitting: Cardiology

## 2016-12-25 ENCOUNTER — Encounter: Payer: Self-pay | Admitting: Cardiology

## 2016-12-25 VITALS — BP 150/70 | HR 70 | Ht 62.0 in | Wt 179.0 lb

## 2016-12-25 DIAGNOSIS — I6523 Occlusion and stenosis of bilateral carotid arteries: Secondary | ICD-10-CM | POA: Diagnosis not present

## 2016-12-25 DIAGNOSIS — I2510 Atherosclerotic heart disease of native coronary artery without angina pectoris: Secondary | ICD-10-CM

## 2016-12-25 DIAGNOSIS — I350 Nonrheumatic aortic (valve) stenosis: Secondary | ICD-10-CM

## 2016-12-25 DIAGNOSIS — I10 Essential (primary) hypertension: Secondary | ICD-10-CM

## 2016-12-25 MED ORDER — AMLODIPINE BESYLATE 10 MG PO TABS: 10.0000 mg | ORAL_TABLET | Freq: Every day | ORAL | 3 refills | Status: DC

## 2016-12-25 NOTE — Patient Instructions (Signed)
Medication Instructions:  INCREASE NORVASC TO 10 MG DAILY   Labwork: ASAP  CMET FASTING LIPID  Testing/Procedures: Your physician has requested that you have a carotid duplex. This test is an ultrasound of the carotid arteries in your neck. It looks at blood flow through these arteries that supply the brain with blood. Allow one hour for this exam. There are no restrictions or special instructions.  Your physician has requested that you have an echocardiogram. Echocardiography is a painless test that uses sound waves to create images of your heart. It provides your doctor with information about the size and shape of your heart and how well your heart's chambers and valves are working. This procedure takes approximately one hour. There are no restrictions for this procedure.    Follow-Up: Your physician wants you to follow-up in: 6 MONTHS.  You will receive a reminder letter in the mail two months in advance. If you don't receive a letter, please call our office to schedule the follow-up appointment.   Any Other Special Instructions Will Be Listed Below (If Applicable).     If you need a refill on your cardiac medications before your next appointment, please call your pharmacy.

## 2016-12-25 NOTE — Progress Notes (Signed)
Clinical Summary Christina Boyle is a 61 y.o.female seen today for follow up of the following medical problems.    1. CAD  - prior CABG in 2007 3 vessel (LIMA to LAD, SVG to OM, SVG to PDA) - Echo 12/2012 LVEF 60-65%  - 01/2014 exercise MPI: did not reach THR, exercise limited by SOB and chest pain. Converted to [employer]. Apical anterior and anterolateral ischemia, intermediate risk. LVEF 62% - 06/2015 cath received stent to SVG-OM suboptimal result. If continued symptoms consider PCI of RCA 06/2015 echo: LVEF 65-70%, no WMAs, grade I diastolic dysfunction, mild to moderate AS   No recent chest pain. No SOB or DOE - compliant with meds.   2. HTN - compliant with bp meds  3. Hyperlipidemia - 06/2016 TC 234 TG 152 HDL 47 LDL 157  - compliant with statin  4.Aortic stenosis - echo 07/2014 LVEF 55-60%, mean grad 15 AVA VTI 1.36. Overall mild to moderate AS.  - echo 06/2015 LVEF 65-70%, mild to mod AS mean grad 14 AVA VTI 1.5   - no recent symptoms.    5. Carotid stenosis - carotid US 01/2015 with bilateral 1-39% disease.  - no recent neuro symptoms  6. Aortic stenosis - mild to moderate aortic stenosis: AVA VTI 1.56 mean grad 14 - no recent chest pain, SOB/DOE, syncope  7. Hypokalemia - related to diuretics in the past    SH: enjoys spending time outside, planting flowers.    Past Medical History:  Diagnosis Date  . Bleeding ulcer 06/2015  . CAD (coronary artery disease)    Multivessel disease s/p CABG, DES SVG to OM  July 03 2015 (occluded SVG to PDA)  . Cervical cancer ([place])   . Chronic bronchitis   . Coarctation of aorta   . COPD (chronic obstructive pulmonary disease) ([place])   . Depression   . Essential hypertension   . GERD (gastroesophageal reflux disease)   . Heart murmur   . Hyperlipidemia   . NSTEMI (non-ST elevated myocardial infarction) [facility])    April 2017  . Obesity (BMI 30-39.9)      No Known Allergies   Current Outpatient  Prescriptions  Medication Sig Dispense Refill  . acetaminophen (TYLENOL) 500 MG tablet Take 1,000 mg by mouth every 6 (six) hours as needed for moderate pain.    Marland Kitchen albuterol (PROVENTIL HFA;VENTOLIN HFA) 108 (90 Base) MCG/ACT inhaler Inhale 1-2 puffs into the lungs every 6 (six) hours as needed for wheezing or shortness of breath. 1 Inhaler 0  . amLODipine (NORVASC) 5 MG tablet TAKE ONE TABLET BY MOUTH ONCE DAILY 90 tablet 3  . aspirin EC 81 MG EC tablet Take 1 tablet (81 mg total) by mouth daily.    . clopidogrel (PLAVIX) 75 MG tablet TAKE ONE TABLET BY MOUTH ONCE DAILY WITH BREAKFAST 90 tablet 3  . furosemide (LASIX) 40 MG tablet Take 1 tablet (40 mg total) by mouth daily. 60 tablet 3  . furosemide (LASIX) 40 MG tablet TAKE 1 TABLET BY MOUTH ONCE DAILY 90 tablet 3  . hydrALAZINE (APRESOLINE) 50 MG tablet Take 1 tablet (50 mg total) by mouth every 8 (eight) hours. 90 tablet 6  . isosorbide mononitrate (IMDUR) 120 MG 24 hr tablet TAKE ONE TABLET BY MOUTH ONCE DAILY 30 tablet 6  . Metoprolol Tartrate 75 MG TABS Take 75 mg by mouth 2 (two) times daily.    . nitroGLYCERIN (NITROSTAT) 0.4 MG SL tablet Place 1 tablet (0.4 mg total) under the  tongue every 5 (five) minutes as needed for chest pain. 25 tablet 3  . pantoprazole (PROTONIX) 40 MG tablet TAKE ONE TABLET BY MOUTH ONCE DAILY BEFORE BREAKFAST 30 tablet 5  . potassium chloride SA (K-DUR,KLOR-CON) 20 MEQ tablet Take 1 tablet (20 mEq total) by mouth 2 (two) times daily. (Patient not taking: Reported on 06/18/2016) 8 tablet 0  . rosuvastatin (CRESTOR) 40 MG tablet Take 1 tablet (40 mg total) by mouth daily. 90 tablet 3  . Tiotropium Bromide Monohydrate (SPIRIVA RESPIMAT) 2.5 MCG/ACT AERS Inhale 1 puff into the lungs as needed.      No current facility-administered medications for this visit.      Past Surgical History:  Procedure Laterality Date  . BREAST BIOPSY Left    Benign  . CARDIAC CATHETERIZATION N/A 06/30/2015   Procedure: Left Heart  Cath and Cors/Grafts Angiography;  Surgeon: Leonie Man, MD;  Location: [place] [facility];  Service: Cardiovascular;  Laterality: N/A;  . CARDIAC CATHETERIZATION N/A 07/03/2015   Procedure: Coronary Stent Intervention;  Surgeon: Peter M Martinique, MD;  Location: [place] [facility];  Service: Cardiovascular;  Laterality: N/A;  . CERVICAL CONE BIOPSY    . CHOLECYSTECTOMY    . CORONARY ARTERY BYPASS GRAFT  02/2006   LIMA to LAD, SVG to OM, SVG to PDA  . ESOPHAGOGASTRODUODENOSCOPY N/A 07/22/2014   Procedure: ESOPHAGOGASTRODUODENOSCOPY (EGD);  Surgeon: [name], MD;  Location: [facility] ENDOSCOPY;  Service: Endoscopy;  Laterality: N/A;  . ESOPHAGOGASTRODUODENOSCOPY N/A 07/12/2015   Procedure: ESOPHAGOGASTRODUODENOSCOPY (EGD);  Surgeon: Rogene Houston, MD;  [facility];  Service: Endoscopy;  Laterality: N/A;  . ESOPHAGOGASTRODUODENOSCOPY N/A 10/18/2015   Procedure: ESOPHAGOGASTRODUODENOSCOPY (EGD);  Surgeon: Rogene Houston, MD;  [facility];  Service: Endoscopy;  Laterality: N/A;  2:55     No Known Allergies    Family History  Problem Relation Age of Onset  . Heart attack Father 35  . Lung cancer Mother 27     Social History Christina Boyle reports that she has been smoking Cigarettes.  She started smoking about 47 years ago. She has a 50.00 pack-year smoking history. She has never used smokeless tobacco. Christina Boyle reports that she does not drink alcohol.   Review of Systems CONSTITUTIONAL: No weight loss, fever, chills, weakness or fatigue.  HEENT: Eyes: No visual loss, blurred vision, double vision or yellow sclerae.No hearing loss, sneezing, congestion, runny nose or sore throat.  SKIN: No rash or itching.  CARDIOVASCULAR: per hpi RESPIRATORY: No shortness of breath, cough or sputum.  GASTROINTESTINAL: No anorexia, nausea, vomiting or diarrhea. No abdominal pain or blood.  GENITOURINARY: No burning on urination, no polyuria NEUROLOGICAL: No headache, dizziness,  syncope, paralysis, ataxia, numbness or tingling in the extremities. No change in bowel or bladder control.  MUSCULOSKELETAL: No muscle, back pain, joint pain or stiffness.  LYMPHATICS: No enlarged nodes. No history of splenectomy.  PSYCHIATRIC: No history of depression or anxiety.  ENDOCRINOLOGIC: No reports of sweating, cold or heat intolerance. No polyuria or polydipsia.  Marland Kitchen   Physical Examination Vitals:   12/25/16 1259  BP: (!) 150/70  Pulse: 70  SpO2: 95%   Vitals:   12/25/16 1259  Weight: 179 lb (81.2 kg)  Height: 5\' 2"  (1.575 m)    Gen: resting comfortably, no acute distress HEENT: no scleral icterus, pupils equal round and reactive, no palptable cervical adenopathy,  CV: RRR, 3/6 systolic murmur rusb, no jvd Resp: Clear to auscultation bilaterally GI: abdomen is  soft, non-tender, non-distended, normal bowel sounds, no hepatosplenomegaly MSK: extremities are warm, no edema.  Skin: warm, no rash Neuro:  no focal deficits Psych: appropriate affect   Diagnostic Studies 12/2012 Echo LVEF 60-65%, mild to mod LVH, normal diastolic function, mild AS (valve area 1.5 VTI, mean grad 10), RV function mildly reduced   07/2011 Carotid US 1. Bilateral carotid bifurcation and proximal ICA plaque, resulting in less than 50% diameter stenosis. The exam does not exclude plaque ulceration or embolization. Continued surveillance recommended.  02/2006 Cath FINDINGS:  1. LV: 143/17/26. EF 65% without regional wall motion abnormality.  2. No aortic stenosis or mitral regurgitation.  3. Left main: There is an 80% stenosis of the mid vessel.  4. LAD: Moderate-size vessel giving rise to two small proximal  diagonals and a larger third diagonal. The LAD has heavy  calcification proximally and diffuse mild disease. The third  diagonal has a 70% stenosis proximally. A small medial subdivision  of this has a 99% stenosis.  5. Circumflex: Moderate-sized vessel giving rise  to a single  marginal. There is a 70% stenosis in the AV groove portion of the  vessel.  6. RCA: Moderate-size, dominant vessel. There is an 80% ostial  stenosis, a 40% stenosis of the mid vessel, and a 90% stenosis just  before the origin of the PDA.  7. Left subclavian artery: Diffuse 30% stenosis proximally. The LIMA  is widely patent.  8. Aortic coarctation with 30 mmHg translesional gradient.  9. Normal great vessel anatomy. There is mild ostial stenosis of the  left common carotid artery.  IMPRESSION/RECOMMENDATIONS:  1. Severe multivessel coronary disease.  2. Coarctation of aorta.  3. Normal left ventricular systolic function.  Will refer the to cardiac surgery for consideration of coronary artery  bypass grafting and repair of her coarctation.   Jan 2015 PFTs Reason for pulmonary function testing is shortness of breath.  1. Spirometry shows a moderate ventilatory defect with minimal airflow  obstruction.  2. Lung volumes show air trapping.  3. DLCO is normal.  4. Airway resistance is high confirming the presence of airflow  obstruction.  5. No significant bronchodilator improvement.  6. Arterial blood gas shows relative resting hypoxia.  7. This study is consistent with COPD considering the patient's  smoking history.   10/2013 Carotid US IMPRESSION: 1. Slight interval progression of left-sided heterogeneous atherosclerotic plaque which now results in an estimated 50- 69% diameter ICA stenosis. 2. Stable heterogeneous atherosclerotic plaque on the right resulting in less than 50% diameter ICA stenosis. 3. Vertebral arteries are patent with normal antegrade flow.  01/2014 Lexiscan MPI IMPRESSION: 1. Abnormal study. There is evidence of apical anterior/anterolateral ischemia. Borderline TID ratio increased at 1.2 may also be indicative of subendocardial or balanced ischemia.  2. Normal left ventricular wall motion.  3. Left  ventricular ejection fraction 62%  4. Intermediate-risk stress test findings*.   07/2014 echo Study Conclusions  - Left ventricle: The cavity size was normal. Wall thickness was increased in a pattern of mild LVH. Systolic function was normal. The estimated ejection fraction was in the range of 55% to 60%. Wall motion was normal; there were no regional wall motion abnormalities. Doppler parameters are consistent with elevated ventricular end-diastolic filling pressure. - Aortic valve: Valve area (VTI): 1.36 cm^2. Valve area (Vmax): 1.26 cm^2. Valve area (Vmean): 1.29 cm^2. - Mitral valve: There was mild regurgitation. Valve area by continuity equation (using LVOT flow): 2.15 cm^2. - Left atrium: The atrium was mildly  dilated. - Atrial septum: No defect or patent foramen ovale was identified.  06/2015 cath 1. Ost RCA lesion, 95% stenosed. Mid RCA lesion, 50% stenosed. Dist RCA lesion, 70% stenosed. Post Atrio lesion, 80% stenosed. 2. 100% occluded SVG-RPDA at the origin 3. Ost LM to LM lesion, 60% stenosed. 4. Likely ostial left main disease but with brisk antegrade flow in the LAD system. Most notable lesion is the diagonal lesion. 5. 3rd Diag lesion, 80% stenosed. 6. Ost Cx to Prox Cx lesion, 80% stenosed. Ost 1st Mrg to 1st Mrg lesion, 80% stenosed. 7. SVG-OM is widely patent with the exception of a 99% stenosis in the origin 8. LIMA was visualized by non-selective angiography due to inability to cannulate and . Significant disease in that portion of the left subclavian artery.   The patient truly has 2 culprit lesions: Ostial SVG-OM and the occluded SVG-RCA with now ostial RCA disease. Both lesions are likely be difficult PCI. As the diagnostic procedure was somewhat difficult as far as engaging the LIMA graft, and significant amount of contrast was used with recent renal insufficiency, I felt it best to stop the procedure today with plans for having the patient  return for 2 site PCI early next week.  Plan:  Patient will return to nursing unit for continued medical care.  Would start heparin back 6-8 hours following sheath removal.  I have loaded with Plavix 300 mg and ordered 75 mg daily.  She needs aggressive blood pressure management.  06/2015 PCI  Origin lesion, 99% stenosed. Post intervention, there is a 20% residual stenosis.  1. Successful but suboptimal stenting of the ostial SVG to the OM due to difficulty positioning the stent at the origin. As a result the stent was deployed but only the distal portion of the stent was in the SVG.   Plan: continue DAPT indefinitely. Would postpone any further PCI and allow stent to re- endothelialize. If she does have refractory symptoms I would consider her for PCI of the native RCA with rotational atherectomy at a later date. If asymptomatic I would treat this medically.    Assessment and Plan   1. CAD  - no symptoms, continue current meds  2. HTN - elevated, we will increase norvasc to 10mg  daily.   3. Hyperlipidemia - repeat lipid panel, continue current meds  4. Carotid stenosis - repeat carotid US  5. Aortic stenosis - repeat echo for surveillance.  - no recent symptoms  F/u 6 months  [name], M.D.

## 2016-12-31 ENCOUNTER — Ambulatory Visit (HOSPITAL_COMMUNITY)
Admission: RE | Admit: 2016-12-31 | Discharge: 2016-12-31 | Disposition: A | Discharge status: Home / Self Care | Payer: Medicaid Other | Source: Ambulatory Visit | Attending: Cardiology | Admitting: Cardiology

## 2016-12-31 DIAGNOSIS — I6523 Occlusion and stenosis of bilateral carotid arteries: Secondary | ICD-10-CM | POA: Diagnosis not present

## 2016-12-31 DIAGNOSIS — I350 Nonrheumatic aortic (valve) stenosis: Secondary | ICD-10-CM | POA: Insufficient documentation

## 2016-12-31 DIAGNOSIS — I340 Nonrheumatic mitral (valve) insufficiency: Secondary | ICD-10-CM | POA: Diagnosis not present

## 2016-12-31 LAB — ECHOCARDIOGRAM COMPLETE
AO mean calculated velocity dopler: 165 cm/s
AOPV: 0.54 m/s
AOVTI: 68.7 cm
AV Area VTI index: 0.76 cm2/m2
AV Area VTI: 1.52 cm2
AV Area mean vel: 1.54 cm2
AV Mean grad: 13 mmHg
AV area mean vel ind: 0.8 cm2/m2
AV peak Index: 0.79
AV pk vel: 241 cm/s
AV vel: 1.46
AVA: 1.46 cm2
AVCELMEANRAT: 0.54
AVPG: 23 mmHg
CHL CUP MV DEC (S): 246
E decel time: 246 msec
E/e' ratio: 16.22
FS: 40 % (ref 28–44)
IVS/LV PW RATIO, ED: 1.13
LA ID, A-P, ES: 34 mm
LA vol index: 33.6 mL/m2
LA vol: 64.5 mL
LADIAMINDEX: 1.77 cm/m2
LAVOLA4C: 56.5 mL
LDCA: 2.84 cm2
LEFT ATRIUM END SYS DIAM: 34 mm
LV PW d: 12.9 mm — AB (ref 0.6–1.1)
LV SIMPSON'S DISK: 68
LV TDI E'LATERAL: 7.4
LV TDI E'MEDIAL: 5.98
LV dias vol index: 33 mL/m2
LVDIAVOL: 64 mL (ref 46–106)
LVEEAVG: 16.22
LVEEMED: 16.22
LVELAT: 7.4 cm/s
LVOT SV: 100 mL
LVOT VTI: 35.2 cm
LVOT diameter: 19 mm
LVOT peak grad rest: 7 mmHg
LVOTPV: 129 cm/s
LVOTVTI: 0.51 cm
LVSYSVOL: 20 mL (ref 14–42)
LVSYSVOLIN: 11 mL/m2
MV Peak grad: 6 mmHg
MVPKAVEL: 84.8 m/s
MVPKEVEL: 120 m/s
RV LATERAL S' VELOCITY: 7.72 cm/s
Stroke v: 44 ml
TAPSE: 18.2 mm
Valve area index: 0.76

## 2016-12-31 LAB — COMPREHENSIVE METABOLIC PANEL
AG Ratio: 2 (calc) (ref 1.0–2.5)
ALT: 11 U/L (ref 6–29)
AST: 13 U/L (ref 10–35)
Albumin: 4.3 g/dL (ref 3.6–5.1)
Alkaline phosphatase (APISO): 92 U/L (ref 33–130)
BILIRUBIN TOTAL: 0.4 mg/dL (ref 0.2–1.2)
BUN: 11 mg/dL (ref 7–25)
CALCIUM: 9.7 mg/dL (ref 8.6–10.4)
CHLORIDE: 102 mmol/L (ref 98–110)
CO2: 29 mmol/L (ref 20–32)
Creat: 0.88 mg/dL (ref 0.50–0.99)
GLOBULIN: 2.2 g/dL (ref 1.9–3.7)
GLUCOSE: 112 mg/dL — AB (ref 65–99)
POTASSIUM: 3.8 mmol/L (ref 3.5–5.3)
Sodium: 141 mmol/L (ref 135–146)
TOTAL PROTEIN: 6.5 g/dL (ref 6.1–8.1)

## 2016-12-31 LAB — LIPID PANEL
CHOLESTEROL: 216 mg/dL — AB (ref <200)
HDL: 52 mg/dL (ref >50)
LDL CHOLESTEROL (CALC): 138 mg/dL — AB
Non-HDL Cholesterol (Calc): 164 mg/dL (calc) — ABNORMAL HIGH (ref <130)
Total CHOL/HDL Ratio: 4.2 (calc) (ref <5.0)
Triglycerides: 136 mg/dL (ref <150)

## 2016-12-31 NOTE — Progress Notes (Signed)
*  PRELIMINARY RESULTS* Echocardiogram 2D Echocardiogram has been performed.  Christina Boyle 12/31/2016, 12:44 PM

## 2017-01-01 ENCOUNTER — Telehealth: Payer: Self-pay

## 2017-01-01 MED ORDER — EZETIMIBE 10 MG PO TABS: 10.0000 mg | ORAL_TABLET | Freq: Every day | ORAL | 3 refills | Status: DC

## 2017-01-01 NOTE — Telephone Encounter (Signed)
-----   Message from [name], MD sent at 12/31/2016  3:36 PM EDT ----- Cholesterol is too high. She is on the strongest statin available (verify taking crestor 40mg  every day without missed doses). If taking daily, will need to start zetia 10mg  daily for additional cholesterol lowering  Zandra Abts MD

## 2017-01-01 NOTE — Telephone Encounter (Signed)
Called pt. No answer, no voicemail. Will mail letter.

## 2017-01-02 ENCOUNTER — Telehealth: Payer: Self-pay

## 2017-01-02 NOTE — Telephone Encounter (Signed)
-----   Message from [name], MD sent at 12/31/2016  3:36 PM EDT ----- Cholesterol is too high. She is on the strongest statin available (verify taking crestor 40mg  every day without missed doses). If taking daily, will need to start zetia 10mg  daily for additional cholesterol lowering  Zandra Abts MD

## 2017-01-02 NOTE — Telephone Encounter (Signed)
Unable to reach pt via phone, just rings,maile letter to patient.[facility] [employer], ref # [id number], Rosendo Gros, states to ask [facility] to run Zetia as brand name and medication is covered.I spoke with Pharmacist Ronalee Belts at [facility], they will run as Brand name

## 2017-01-08 ENCOUNTER — Telehealth: Payer: Self-pay | Admitting: Cardiology

## 2017-01-08 NOTE — Telephone Encounter (Signed)
Patient states that she received letter regarding her lab work. Please return call. / tg

## 2017-01-08 NOTE — Telephone Encounter (Signed)
Spoke with pt. She informed me that she has not taken any Crestor in a while. She doesn't have it at her house. Pharmacy states she has only filled it 1 time-(original prescription) . Per result note from Dr. Harl Bowie concerning labs, he wanted clarification on dosage of crestor. Will forward to Dr. Harl Bowie.

## 2017-01-13 ENCOUNTER — Other Ambulatory Visit: Payer: Self-pay | Admitting: Cardiology

## 2017-01-13 MED ORDER — NITROGLYCERIN 0.4 MG SL SUBL: 0.4000 mg | SUBLINGUAL_TABLET | SUBLINGUAL | 3 refills | Status: DC | PRN

## 2017-01-13 NOTE — Telephone Encounter (Signed)
Refill on NTG sent to [facility] / tg

## 2017-01-13 NOTE — Telephone Encounter (Signed)
Refilled NTG per fax request

## 2017-01-14 MED ORDER — ROSUVASTATIN CALCIUM 40 MG PO TABS: 40.0000 mg | ORAL_TABLET | Freq: Every day | ORAL | 3 refills | Status: DC

## 2017-01-14 NOTE — Telephone Encounter (Signed)
Patient should be on crestor 40mg  daily

## 2017-01-23 ENCOUNTER — Telehealth: Payer: Self-pay | Admitting: *Deleted

## 2017-01-23 MED ORDER — HYDRALAZINE HCL 50 MG PO TABS: 75.0000 mg | ORAL_TABLET | Freq: Three times a day (TID) | ORAL | 3 refills | Status: DC

## 2017-01-23 NOTE — Telephone Encounter (Signed)
Pt says she has been taking medications as directed. Will increase hydralazine 75 mg tid - update rx sent to [facility]. Pt voiced understanding

## 2017-01-23 NOTE — Telephone Encounter (Signed)
-----   Message from [name], MD sent at 01/16/2017  2:28 PM EST ----- BP log too high, verify she has been taking all her meds. If so increase hydralazine to 75mg  tid  Zandra Abts MD

## 2017-03-10 ENCOUNTER — Other Ambulatory Visit (HOSPITAL_COMMUNITY): Payer: Self-pay | Admitting: Pulmonary Disease

## 2017-03-10 ENCOUNTER — Ambulatory Visit (HOSPITAL_COMMUNITY)
Admission: RE | Admit: 2017-03-10 | Discharge: 2017-03-10 | Disposition: A | Discharge status: Home / Self Care | Payer: Medicaid Other | Source: Ambulatory Visit | Attending: Pulmonary Disease | Admitting: Pulmonary Disease

## 2017-03-10 DIAGNOSIS — I700 Atherosclerosis of aorta: Secondary | ICD-10-CM | POA: Diagnosis not present

## 2017-03-10 DIAGNOSIS — Z951 Presence of aortocoronary bypass graft: Secondary | ICD-10-CM | POA: Insufficient documentation

## 2017-03-10 DIAGNOSIS — J449 Chronic obstructive pulmonary disease, unspecified: Secondary | ICD-10-CM | POA: Insufficient documentation

## 2017-03-10 DIAGNOSIS — R0602 Shortness of breath: Secondary | ICD-10-CM | POA: Diagnosis not present

## 2017-03-12 ENCOUNTER — Ambulatory Visit (INDEPENDENT_AMBULATORY_CARE_PROVIDER_SITE_OTHER): Payer: Medicaid Other | Admitting: Internal Medicine

## 2017-03-12 ENCOUNTER — Encounter (INDEPENDENT_AMBULATORY_CARE_PROVIDER_SITE_OTHER): Payer: Self-pay | Admitting: Internal Medicine

## 2017-03-12 VITALS — BP 140/80 | HR 80 | Temp 98.0°F | Ht 62.0 in | Wt 174.4 lb

## 2017-03-12 DIAGNOSIS — K219 Gastro-esophageal reflux disease without esophagitis: Secondary | ICD-10-CM

## 2017-03-12 NOTE — Progress Notes (Signed)
Subjective:    Patient ID: Christina Boyle, female    DOB: 1955/07/01, 62 y.o.   MRN: [id number]  HPI Here today for f/u. Last seen in 2017. Underwent an EGD in Sept of 2017 for UGIB  Impression:               - Normal esophagus.                           - Z-line regular, 36 cm from the incisors.                           - Gastritis.                           - Scar in the gastric antrum.                           - Normal duodenal bulb and second portion of the                            duodenum.                           - No specimens collected. In May of 2017 she underwent and EGD which revealed: Impression:               - Normal proximal esophagus and mid esophagus.                           - Plaques in the lower third of the esophagus.                            Cells for cytology obtained.                           - Non-bleeding gastric ulcers with no stigmata of                            bleeding.                           - Non-bleeding gastric ulcer with pigmented                            material.                           - Duodenal erosions without bleeding.                           - Duodenal erosions without bleeding. She tells me she is doing good.  Her appetite is good. No weight loss. The Protonix is helping. She denies any abdominal pain. She has a BM x 1 a weeks.               Presently taking Tamiflu Prednisione and Doxycycline for bronchitis.         Hx of cardiac stents and maintained on Plavix.  Review of Systems Past Medical History:  Diagnosis Date  . Bleeding ulcer 06/2015  . CAD (coronary artery disease)    Multivessel disease s/p CABG, DES SVG to OM  July 03 2015 (occluded SVG to PDA)  . Cervical cancer ([place])   . Chronic bronchitis   . Coarctation of aorta   . COPD (chronic obstructive pulmonary disease) ([place])   . Depression   . Essential hypertension   . GERD (gastroesophageal reflux disease)   . Heart murmur   . Hyperlipidemia     . NSTEMI (non-ST elevated myocardial infarction) [facility])    April 2017  . Obesity (BMI 30-39.9)     Past Surgical History:  Procedure Laterality Date  . BREAST BIOPSY Left    Benign  . CARDIAC CATHETERIZATION N/A 06/30/2015   Procedure: Left Heart Cath and Cors/Grafts Angiography;  Surgeon: Leonie Man, MD;  Location: [place] [facility];  Service: Cardiovascular;  Laterality: N/A;  . CARDIAC CATHETERIZATION N/A 07/03/2015   Procedure: Coronary Stent Intervention;  Surgeon: Peter M Martinique, MD;  Location: [place] [facility];  Service: Cardiovascular;  Laterality: N/A;  . CERVICAL CONE BIOPSY    . CHOLECYSTECTOMY    . CORONARY ARTERY BYPASS GRAFT  02/2006   LIMA to LAD, SVG to OM, SVG to PDA  . ESOPHAGOGASTRODUODENOSCOPY N/A 07/22/2014   Procedure: ESOPHAGOGASTRODUODENOSCOPY (EGD);  Surgeon: [name], MD;  Location: [facility] ENDOSCOPY;  Service: Endoscopy;  Laterality: N/A;  . ESOPHAGOGASTRODUODENOSCOPY N/A 07/12/2015   Procedure: ESOPHAGOGASTRODUODENOSCOPY (EGD);  Surgeon: Rogene Houston, MD;  [facility];  Service: Endoscopy;  Laterality: N/A;  . ESOPHAGOGASTRODUODENOSCOPY N/A 10/18/2015   Procedure: ESOPHAGOGASTRODUODENOSCOPY (EGD);  Surgeon: Rogene Houston, MD;  [facility];  Service: Endoscopy;  Laterality: N/A;  2:55    No Known Allergies  Current Outpatient Medications on File Prior to Visit  Medication Sig Dispense Refill  . acetaminophen (TYLENOL) 500 MG tablet Take 1,000 mg by mouth every 6 (six) hours as needed for moderate pain.    Marland Kitchen albuterol (PROVENTIL HFA;VENTOLIN HFA) 108 (90 Base) MCG/ACT inhaler Inhale 1-2 puffs into the lungs every 6 (six) hours as needed for wheezing or shortness of breath. 1 Inhaler 0  . amLODipine (NORVASC) 10 MG tablet Take 1 tablet (10 mg total) by mouth daily. 90 tablet 3  . aspirin EC 81 MG EC tablet Take 1 tablet (81 mg total) by mouth daily.    . clopidogrel (PLAVIX) 75 MG tablet TAKE ONE TABLET BY MOUTH ONCE DAILY WITH  BREAKFAST 90 tablet 3  . furosemide (LASIX) 40 MG tablet Take 1 tablet (40 mg total) by mouth daily. 60 tablet 3  . hydrALAZINE (APRESOLINE) 50 MG tablet Take 1.5 tablets (75 mg total) 3 (three) times daily by mouth. 135 tablet 3  . isosorbide mononitrate (IMDUR) 120 MG 24 hr tablet TAKE ONE TABLET BY MOUTH ONCE DAILY 30 tablet 6  . Metoprolol Tartrate 75 MG TABS Take 75 mg by mouth 2 (two) times daily.    . nitroGLYCERIN (NITROSTAT) 0.4 MG SL tablet Place 1 tablet (0.4 mg total) every 5 (five) minutes as needed under the tongue for chest pain. 25 tablet 3  . pantoprazole (PROTONIX) 40 MG tablet TAKE ONE TABLET BY MOUTH ONCE DAILY BEFORE BREAKFAST 30 tablet 5  . rosuvastatin (CRESTOR) 40 MG tablet Take 1 tablet (40 mg total) daily by mouth. 90 tablet 3  . Tiotropium Bromide Monohydrate (SPIRIVA RESPIMAT) 2.5 MCG/ACT AERS Inhale 1 puff into the  lungs as needed.      No current facility-administered medications on file prior to visit.         Objective:   Physical Exam There were no vitals taken for this visit.  Blood pressure 140/80, pulse 80, temperature 98 F (36.7 C), height 5\' 2"  (1.575 m), weight 174 lb 6.4 oz (79.1 kg). Alert and oriented. Skin warm and dry. Oral mucosa is moist.   . Sclera anicteric, conjunctivae is pink. Thyroid not enlarged. No cervical lymphadenopathy. Bilateral wheezes  Heart regular rate and rhythm.  Abdomen is soft. Bowel sounds are positive. No hepatomegaly. No abdominal masses felt. No tenderness.  No edema to lower extremities.          Assessment & Plan:  Hx of UGI bleed/ulcer. She is doing well. She has no complaints.  She will have a OV in 1 year

## 2017-03-12 NOTE — Patient Instructions (Signed)
Continue the Protonix. OV in 1 year.  

## 2017-03-31 ENCOUNTER — Other Ambulatory Visit: Payer: Self-pay | Admitting: Cardiology

## 2017-04-01 ENCOUNTER — Emergency Department (HOSPITAL_COMMUNITY): Payer: Medicaid Other

## 2017-04-01 ENCOUNTER — Encounter (HOSPITAL_COMMUNITY): Payer: Self-pay | Admitting: Emergency Medicine

## 2017-04-01 ENCOUNTER — Emergency Department (HOSPITAL_COMMUNITY)
Admission: EM | Admit: 2017-04-01 | Discharge: 2017-04-02 | Disposition: A | Discharge status: Home / Self Care | Payer: Medicaid Other | Attending: Emergency Medicine | Admitting: Emergency Medicine

## 2017-04-01 ENCOUNTER — Other Ambulatory Visit: Payer: Self-pay

## 2017-04-01 DIAGNOSIS — I1310 Hypertensive heart and chronic kidney disease without heart failure, with stage 1 through stage 4 chronic kidney disease, or unspecified chronic kidney disease: Secondary | ICD-10-CM | POA: Diagnosis not present

## 2017-04-01 DIAGNOSIS — E876 Hypokalemia: Secondary | ICD-10-CM | POA: Diagnosis not present

## 2017-04-01 DIAGNOSIS — F17210 Nicotine dependence, cigarettes, uncomplicated: Secondary | ICD-10-CM | POA: Insufficient documentation

## 2017-04-01 DIAGNOSIS — Z7982 Long term (current) use of aspirin: Secondary | ICD-10-CM | POA: Insufficient documentation

## 2017-04-01 DIAGNOSIS — J449 Chronic obstructive pulmonary disease, unspecified: Secondary | ICD-10-CM | POA: Diagnosis not present

## 2017-04-01 DIAGNOSIS — Z79899 Other long term (current) drug therapy: Secondary | ICD-10-CM | POA: Insufficient documentation

## 2017-04-01 DIAGNOSIS — N183 Chronic kidney disease, stage 3 (moderate): Secondary | ICD-10-CM | POA: Diagnosis not present

## 2017-04-01 DIAGNOSIS — R531 Weakness: Secondary | ICD-10-CM | POA: Diagnosis present

## 2017-04-01 DIAGNOSIS — Z7902 Long term (current) use of antithrombotics/antiplatelets: Secondary | ICD-10-CM | POA: Diagnosis not present

## 2017-04-01 DIAGNOSIS — I259 Chronic ischemic heart disease, unspecified: Secondary | ICD-10-CM | POA: Insufficient documentation

## 2017-04-01 LAB — CBC
HEMATOCRIT: 43.5 % (ref 36.0–46.0)
Hemoglobin: 13.8 g/dL (ref 12.0–15.0)
MCH: 25.2 pg — ABNORMAL LOW (ref 26.0–34.0)
MCHC: 31.7 g/dL (ref 30.0–36.0)
MCV: 79.5 fL (ref 78.0–100.0)
Platelets: 202 10*3/uL (ref 150–400)
RBC: 5.47 MIL/uL — AB (ref 3.87–5.11)
RDW: 16.9 % — AB (ref 11.5–15.5)
WBC: 10.9 10*3/uL — AB (ref 4.0–10.5)

## 2017-04-01 LAB — BASIC METABOLIC PANEL
Anion gap: 16 — ABNORMAL HIGH (ref 5–15)
BUN: 10 mg/dL (ref 6–20)
CHLORIDE: 98 mmol/L — AB (ref 101–111)
CO2: 25 mmol/L (ref 22–32)
Calcium: 9.5 mg/dL (ref 8.9–10.3)
Creatinine, Ser: 0.87 mg/dL (ref 0.44–1.00)
GFR calc Af Amer: >60 mL/min (ref >60)
GFR calc non Af Amer: >60 mL/min (ref >60)
Glucose, Bld: 105 mg/dL — ABNORMAL HIGH (ref 65–99)
Potassium: 2.3 mmol/L — CL (ref 3.5–5.1)
SODIUM: 139 mmol/L (ref 135–145)

## 2017-04-01 LAB — I-STAT TROPONIN, ED: Troponin i, poc: 0.02 ng/mL (ref 0.00–0.08)

## 2017-04-01 LAB — MAGNESIUM: MAGNESIUM: 2.2 mg/dL (ref 1.7–2.4)

## 2017-04-01 MED ORDER — POTASSIUM CHLORIDE CRYS ER 20 MEQ PO TBCR
EXTENDED_RELEASE_TABLET | ORAL | Status: AC
  Filled 2017-04-01: qty 2

## 2017-04-01 MED ORDER — POTASSIUM CHLORIDE 10 MEQ/100ML IV SOLN
10.0000 meq | INTRAVENOUS | Status: AC
  Administered 2017-04-01 (×3): 10 meq via INTRAVENOUS
  Filled 2017-04-01 (×2): qty 100

## 2017-04-01 MED ORDER — POTASSIUM CHLORIDE CRYS ER 20 MEQ PO TBCR: 20.0000 meq | EXTENDED_RELEASE_TABLET | Freq: Two times a day (BID) | ORAL | 0 refills | Status: DC

## 2017-04-01 MED ORDER — POTASSIUM CHLORIDE CRYS ER 20 MEQ PO TBCR
40.0000 meq | EXTENDED_RELEASE_TABLET | Freq: Once | ORAL | Status: AC
  Administered 2017-04-01: 40 meq via ORAL

## 2017-04-01 MED ORDER — POTASSIUM CHLORIDE 10 MEQ/100ML IV SOLN
INTRAVENOUS | Status: AC
  Administered 2017-04-01: 10 meq via INTRAVENOUS
  Filled 2017-04-01: qty 100

## 2017-04-01 MED ORDER — SODIUM CHLORIDE 0.9 % IV BOLUS (SEPSIS)
500.0000 mL | Freq: Once | INTRAVENOUS | Status: AC
  Administered 2017-04-01: 500 mL via INTRAVENOUS

## 2017-04-01 NOTE — Discharge Instructions (Signed)
Make sure you are getting plenty of rest and drinking a lot of fluids.  Start the prescription for potassium, tomorrow morning.  It is important to follow-up with your primary care doctor for a checkup in 3-5 days and repeat blood testing.  Return here, if needed, for problems.

## 2017-04-01 NOTE — ED Triage Notes (Signed)
Patient c/o weakness and pins and needles feelings in her hands, onset 2 days ago. Patient states she is taking a diuretic and her potassium gets low.

## 2017-04-01 NOTE — ED Provider Notes (Signed)
[facility] EMERGENCY DEPARTMENT Provider Note   CSN: [id number] Arrival date & time: 04/01/17  1538     History   Chief Complaint Chief Complaint  Patient presents with  . Weakness    HPI Christina Boyle is a 62 y.o. female.  She presents for evaluation of generalized weakness with a sensation of pins and needles in her hands and feet, which come and go.  Symptoms insidious onset several days ago.  Symptoms are not persistent.  She denies headache, chest pain, focal weakness or inability to ambulate.  There is been no shortness of breath or chest pain.  She is taking her usual medications including a diuretic but does not use potassium supplementation.  There are no other known modifying factors.  HPI  Past Medical History:  Diagnosis Date  . Bleeding ulcer 06/2015  . CAD (coronary artery disease)    Multivessel disease s/p CABG, DES SVG to OM  July 03 2015 (occluded SVG to PDA)  . Cervical cancer ([place])   . Chronic bronchitis   . Coarctation of aorta   . COPD (chronic obstructive pulmonary disease) ([place])   . Depression   . Essential hypertension   . GERD (gastroesophageal reflux disease)   . Heart murmur   . Hyperlipidemia   . NSTEMI (non-ST elevated myocardial infarction) [facility])    April 2017  . Obesity (BMI 30-39.9)     Patient Active Problem List   Diagnosis Date Noted  . Hypokalemia 10/25/2015  . CAD (coronary artery disease) 10/25/2015  . H. pylori infection 10/25/2015  . CKD (chronic kidney disease) stage 3, GFR 30-59 ml/min (HCC) 07/14/2015  . Thrombocytopenia ([place]) 07/12/2015  . Hypovolemia 07/12/2015  . AKI (acute kidney injury) ([place]) 07/11/2015  . Acute upper GI bleed 07/11/2015  . Obesity (BMI 30-39.9) 06/30/2015  . Hx of CABG 2007 06/30/2015  . Hypertensive cardiovascular disease 06/30/2015  . Aortic stenosis-mild to moderate 06/30/2015  . Coronary artery disease involving native heart with unstable angina pectoris ([place]) 06/30/2015  . NSTEMI  (non-ST elevated myocardial infarction) ([place]) 06/29/2015  . COPD exacerbation ([place]) 06/27/2015  . Acute gastric ulcer with hemorrhage   . Elevated troponin   . Upper gastrointestinal bleed 07/21/2014  . Symptomatic anemia 07/21/2014  . Chest pain 07/21/2014  . Acute renal failure ([place]) 07/21/2014  . Azotemia 07/21/2014  . Abnormal urinalysis 07/21/2014  . COPD (chronic obstructive pulmonary disease) ([place]) 07/21/2014  . Acute blood loss anemia 07/21/2014  . Hypertension 07/31/2011  . Carotid bruit 07/31/2011  . CERVICAL CANCER 04/04/2009  . Hyperlipemia 04/04/2009  . TOBACCO ABUSE 04/04/2009  . COARCTATION OF AORTA 04/04/2009  . FATIGUE 04/04/2009    Past Surgical History:  Procedure Laterality Date  . BREAST BIOPSY Left    Benign  . CARDIAC CATHETERIZATION N/A 06/30/2015   Procedure: Left Heart Cath and Cors/Grafts Angiography;  Surgeon: Leonie Man, MD;  Location: [place] CV LAB;  Service: Cardiovascular;  Laterality: N/A;  . CARDIAC CATHETERIZATION N/A 07/03/2015   Procedure: Coronary Stent Intervention;  Surgeon: Peter M Martinique, MD;  Location: [place] CV LAB;  Service: Cardiovascular;  Laterality: N/A;  . CERVICAL CONE BIOPSY    . CHOLECYSTECTOMY    . CORONARY ARTERY BYPASS GRAFT  02/2006   LIMA to LAD, SVG to OM, SVG to PDA  . ESOPHAGOGASTRODUODENOSCOPY N/A 07/22/2014   Procedure: ESOPHAGOGASTRODUODENOSCOPY (EGD);  Surgeon: [name], MD;  Location: [facility] ENDOSCOPY;  Service: Endoscopy;  Laterality: N/A;  . ESOPHAGOGASTRODUODENOSCOPY N/A 07/12/2015  Procedure: ESOPHAGOGASTRODUODENOSCOPY (EGD);  Surgeon: Rogene Houston, MD;  [facility];  Service: Endoscopy;  Laterality: N/A;  . ESOPHAGOGASTRODUODENOSCOPY N/A 10/18/2015   Procedure: ESOPHAGOGASTRODUODENOSCOPY (EGD);  Surgeon: Rogene Houston, MD;  Location[facility] ENDO SUITE;  Service: Endoscopy;  Laterality: N/A;  2:55    OB History    Gravida Para Term Preterm AB Living   5 5 5          SAB TAB Ectopic  Multiple Live Births                   Home Medications    Prior to Admission medications   Medication Sig Start Date End Date Taking? Authorizing Provider  acetaminophen (TYLENOL) 500 MG tablet Take 1,000 mg by mouth every 6 (six) hours as needed for moderate pain.   Yes [provider]  albuterol (PROVENTIL HFA;VENTOLIN HFA) 108 (90 Base) MCG/ACT inhaler Inhale 1-2 puffs into the lungs every 6 (six) hours as needed for wheezing or shortness of breath. 05/27/16  Yes Long, Wonda Olds, MD  amLODipine (NORVASC) 10 MG tablet Take 1 tablet (10 mg total) by mouth daily. 12/25/16 04/01/17 Yes BranchAlphonse Guild, MD  aspirin EC 81 MG EC tablet Take 1 tablet (81 mg total) by mouth daily. 07/23/14  Yes [name], MD  clopidogrel (PLAVIX) 75 MG tablet TAKE ONE TABLET BY MOUTH ONCE DAILY WITH BREAKFAST 07/08/16  Yes Branch, Alphonse Guild, MD  docusate sodium (COLACE) 100 MG capsule Take 200 mg by mouth every morning.   Yes [provider]  furosemide (LASIX) 40 MG tablet Take 1 tablet (40 mg total) by mouth daily. 10/26/15  Yes Elgergawy, Silver Huguenin, MD  hydrALAZINE (APRESOLINE) 50 MG tablet Take 1.5 tablets (75 mg total) 3 (three) times daily by mouth. 01/23/17 04/23/17 Yes Branch, Alphonse Guild, MD  isosorbide mononitrate (IMDUR) 120 MG 24 hr tablet TAKE 1 TABLET BY MOUTH ONCE DAILY 03/31/17  Yes Branch, Alphonse Guild, MD  Metoprolol Tartrate 75 MG TABS Take 75 mg by mouth 2 (two) times daily.   Yes [provider]  Multiple Vitamin (MULTIVITAMIN WITH MINERALS) TABS tablet Take 1 tablet by mouth daily.   Yes [provider]  nitroGLYCERIN (NITROSTAT) 0.4 MG SL tablet Place 1 tablet (0.4 mg total) every 5 (five) minutes as needed under the tongue for chest pain. 01/13/17  Yes Branch, Alphonse Guild, MD  pantoprazole (PROTONIX) 40 MG tablet TAKE ONE TABLET BY MOUTH ONCE DAILY BEFORE BREAKFAST 10/28/16  Yes Rehman, Mechele Dawley, MD  rosuvastatin (CRESTOR) 40 MG tablet Take 1 tablet (40 mg  total) daily by mouth. 01/14/17 04/14/17 Yes BranchAlphonse Guild, MD  Tiotropium Bromide Monohydrate (SPIRIVA RESPIMAT) 2.5 MCG/ACT AERS Inhale 1 puff into the lungs daily as needed (or shortness of breath).    Yes [provider]  potassium chloride SA (K-DUR,KLOR-CON) 20 MEQ tablet Take 1 tablet (20 mEq total) by mouth 2 (two) times daily. 04/01/17   Daleen Bo, MD    Family History Family History  Problem Relation Age of Onset  . Heart attack Father 76  . Lung cancer Mother 19    Social History Social History   Tobacco Use  . Smoking status: Current Every Day Smoker    Packs/day: 1.00    Years: 50.00    Pack years: 50.00    Types: Cigarettes    Start date: 05/21/1969  . Smokeless tobacco: Never Used  Substance Use Topics  . Alcohol use: No  Alcohol/week: 0.0 oz  . Drug use: No     Allergies   Patient has no known allergies.   Review of Systems Review of Systems  All other systems reviewed and are negative.    Physical Exam Updated Vital Signs BP (!) 168/66   Pulse 80   Temp (!) 97.4 F (36.3 C) (Oral) Comment: drinking fluids  Resp 17   Ht 5\' 2"  (1.575 m)   Wt 78.9 kg (174 lb)   SpO2 90%   BMI 31.83 kg/m   Physical Exam  Constitutional: She is oriented to person, place, and time. She appears well-developed and well-nourished. No distress.  HENT:  Head: Normocephalic and atraumatic.  Right Ear: External ear normal.  Left Ear: External ear normal.  Eyes: Conjunctivae and EOM are normal. Pupils are equal, round, and reactive to light.  Neck: Normal range of motion and phonation normal. Neck supple.  Cardiovascular: Normal rate, regular rhythm and normal heart sounds.  Pulmonary/Chest: Effort normal and breath sounds normal. She exhibits no bony tenderness.  Abdominal: Soft. There is no tenderness.  Musculoskeletal: Normal range of motion.  Normal grip strength bilaterally.  Able to elevate both legs independently and hold them for greater than  5 seconds.  Neurological: She is alert and oriented to person, place, and time. No cranial nerve deficit or sensory deficit. She exhibits normal muscle tone. Coordination normal.  No dysarthria, aphasia or nystagmus.  Skin: Skin is warm, dry and intact.  Psychiatric: She has a normal mood and affect. Her behavior is normal.  Confused  Nursing note and vitals reviewed.    ED Treatments / Results  Labs (all labs ordered are listed, but only abnormal results are displayed) Labs Reviewed  BASIC METABOLIC PANEL - Abnormal; Notable for the following components:      Result Value   Potassium 2.3 (*)    Chloride 98 (*)    Glucose, Bld 105 (*)    Anion gap 16 (*)    All other components within normal limits  CBC - Abnormal; Notable for the following components:   WBC 10.9 (*)    RBC 5.47 (*)    MCH 25.2 (*)    RDW 16.9 (*)    All other components within normal limits  MAGNESIUM  I-STAT TROPONIN, ED    EKG  EKG Interpretation  Date/Time:  Tuesday April 01 2017 15:53:01 EST Ventricular Rate:  91 PR Interval:  202 QRS Duration: 86 QT Interval:  408 QTC Calculation: 501 R Axis:   43 Text Interpretation:  Normal sinus rhythm Marked ST abnormality, possible inferolateral subendocardial injury Prolonged QT Abnormal ECG since last tracing no significant change Confirmed by Daleen Bo [phone]) on 04/01/2017 3:59:01 PM       Radiology Dg Chest 2 View  Result Date: 04/01/2017 CLINICAL DATA:  Chest pain and weakness. EXAM: CHEST  2 VIEW COMPARISON:  03/10/2017 FINDINGS: Previous median sternotomy and CABG procedure. Aortic atherosclerosis. No pleural effusion or edema identified. Coarsened interstitial markings are identified within both lungs. No superimposed airspace consolidation. IMPRESSION: 1. No active cardiopulmonary abnormalities. 2.  Aortic Atherosclerosis (ICD10-I70.0). 3. Chronic interstitial coarsening which may reflect changes secondary to smoking. Electronically Signed    By: Kerby Moors M.D.   On: 04/01/2017 16:17    Procedures Procedures (including critical care time)  Medications Ordered in ED Medications  potassium chloride 10 mEq in 100 mL IVPB (10 mEq Intravenous New Bag/Given 04/01/17 2253)  sodium chloride 0.9 % bolus 500 mL (500 mLs  Intravenous New Bag/Given 04/01/17 1713)  potassium chloride SA (K-DUR,KLOR-CON) CR tablet 40 mEq (40 mEq Oral Given 04/01/17 1833)     Initial Impression / Assessment and Plan / ED Course  I have reviewed the triage vital signs and the nursing notes.  Pertinent labs & imaging results that were available during my care of the patient were reviewed by me and considered in my medical decision making (see chart for details).  Clinical Course as of Apr 01 2322  Tue Apr 01, 2017  1819 Potassium low, 2.3.  Will start oral and IV supplementation.  [EW]    Clinical Course User Index [EW] Daleen Bo, MD     Patient Vitals for the past 24 hrs:  BP Temp Temp src Pulse Resp SpO2 Height Weight  04/01/17 2200 (!) 168/66 - - 80 17 90 % - -  04/01/17 2030 (!) 172/80 - - 84 14 91 % - -  04/01/17 2000 (!) 173/70 - - 85 19 91 % - -  04/01/17 1952 (!) 188/65 - - 85 16 92 % - -  04/01/17 1930 (!) 148/69 - - 82 16 92 % - -  04/01/17 1900 (!) 182/68 - - 87 17 91 % - -  04/01/17 1830 (!) 167/63 - - 88 19 93 % - -  04/01/17 1730 (!) 180/60 - - 88 17 91 % - -  04/01/17 1546 - - - - - - 5\' 2"  (1.575 m) 78.9 kg (174 lb)  04/01/17 1544 (!) 178/61 (!) 97.4 F (36.3 C) Oral 96 20 92 % - -    At D/C- Reevaluation with update and discussion. After initial assessment and treatment, an updated evaluation reveals no further complaints.  Findings discussed with patient and all questions answered. Daleen Bo     Final Clinical Impressions(s) / ED Diagnoses   Final diagnoses:  Hypokalemia   Nonspecific neurologic symptoms likely related to hypokalemia.  Patient is on diuretic without taking potassium supplementation.  Doubt  CVA, metabolic instability or impending vascular collapse.  Potassium repleted in the ED.  Nursing Notes Reviewed/ Care Coordinated Applicable Imaging Reviewed Interpretation of Laboratory Data incorporated into ED treatment  The patient appears reasonably screened and/or stabilized for discharge and I doubt any other medical condition or other [facility] requiring further screening, evaluation, or treatment in the ED at this time prior to discharge.  Plan: Home Medications-continue usual medications; Home Treatments-rest, fluids; return here if the recommended treatment, does not improve the symptoms; Recommended follow up-PCP, as needed and in 1 week for checkup and repeat potassium testing   ED Discharge Orders        Ordered    potassium chloride SA (K-DUR,KLOR-CON) 20 MEQ tablet  2 times daily     04/01/17 2228       Daleen Bo, MD 04/01/17 2325

## 2017-04-01 NOTE — ED Triage Notes (Signed)
Patient c/o chest pain in Triage. EKG completed and given to Dr. Eulis Foster.

## 2017-04-01 NOTE — ED Notes (Signed)
CRITICAL VALUE ALERT  Critical Value:  K+ 2.3  Date & Time Notied:  04-01-17 1818  Provider Notified: Eulis Foster  Orders Received/Actions taken: potassium runs

## 2017-04-11 ENCOUNTER — Encounter: Payer: Self-pay | Admitting: Pulmonary Disease

## 2017-04-25 ENCOUNTER — Other Ambulatory Visit (INDEPENDENT_AMBULATORY_CARE_PROVIDER_SITE_OTHER): Payer: Self-pay | Admitting: Internal Medicine

## 2017-05-16 ENCOUNTER — Encounter (HOSPITAL_COMMUNITY): Payer: Self-pay | Admitting: Emergency Medicine

## 2017-05-16 ENCOUNTER — Other Ambulatory Visit: Payer: Self-pay

## 2017-05-16 ENCOUNTER — Observation Stay (HOSPITAL_COMMUNITY)
Admission: EM | Admit: 2017-05-16 | Discharge: 2017-05-18 | Disposition: A | Discharge status: Home / Self Care | Payer: Medicaid Other | Attending: Pulmonary Disease | Admitting: Pulmonary Disease

## 2017-05-16 DIAGNOSIS — E785 Hyperlipidemia, unspecified: Secondary | ICD-10-CM | POA: Diagnosis present

## 2017-05-16 DIAGNOSIS — I214 Non-ST elevation (NSTEMI) myocardial infarction: Secondary | ICD-10-CM

## 2017-05-16 DIAGNOSIS — I2510 Atherosclerotic heart disease of native coronary artery without angina pectoris: Secondary | ICD-10-CM | POA: Insufficient documentation

## 2017-05-16 DIAGNOSIS — Z7902 Long term (current) use of antithrombotics/antiplatelets: Secondary | ICD-10-CM | POA: Insufficient documentation

## 2017-05-16 DIAGNOSIS — N183 Chronic kidney disease, stage 3 (moderate): Secondary | ICD-10-CM | POA: Insufficient documentation

## 2017-05-16 DIAGNOSIS — J449 Chronic obstructive pulmonary disease, unspecified: Secondary | ICD-10-CM | POA: Diagnosis not present

## 2017-05-16 DIAGNOSIS — J441 Chronic obstructive pulmonary disease with (acute) exacerbation: Secondary | ICD-10-CM

## 2017-05-16 DIAGNOSIS — J439 Emphysema, unspecified: Secondary | ICD-10-CM

## 2017-05-16 DIAGNOSIS — R748 Abnormal levels of other serum enzymes: Secondary | ICD-10-CM | POA: Diagnosis not present

## 2017-05-16 DIAGNOSIS — K219 Gastro-esophageal reflux disease without esophagitis: Secondary | ICD-10-CM

## 2017-05-16 DIAGNOSIS — I252 Old myocardial infarction: Secondary | ICD-10-CM | POA: Insufficient documentation

## 2017-05-16 DIAGNOSIS — Z8541 Personal history of malignant neoplasm of cervix uteri: Secondary | ICD-10-CM | POA: Diagnosis not present

## 2017-05-16 DIAGNOSIS — I110 Hypertensive heart disease with heart failure: Secondary | ICD-10-CM

## 2017-05-16 DIAGNOSIS — F17210 Nicotine dependence, cigarettes, uncomplicated: Secondary | ICD-10-CM | POA: Insufficient documentation

## 2017-05-16 DIAGNOSIS — I10 Essential (primary) hypertension: Secondary | ICD-10-CM

## 2017-05-16 DIAGNOSIS — I5021 Acute systolic (congestive) heart failure: Secondary | ICD-10-CM

## 2017-05-16 DIAGNOSIS — R7989 Other specified abnormal findings of blood chemistry: Secondary | ICD-10-CM

## 2017-05-16 DIAGNOSIS — F329 Major depressive disorder, single episode, unspecified: Secondary | ICD-10-CM | POA: Diagnosis not present

## 2017-05-16 DIAGNOSIS — E876 Hypokalemia: Secondary | ICD-10-CM | POA: Diagnosis not present

## 2017-05-16 DIAGNOSIS — R42 Dizziness and giddiness: Secondary | ICD-10-CM | POA: Diagnosis not present

## 2017-05-16 DIAGNOSIS — R531 Weakness: Secondary | ICD-10-CM

## 2017-05-16 DIAGNOSIS — Z951 Presence of aortocoronary bypass graft: Secondary | ICD-10-CM | POA: Diagnosis not present

## 2017-05-16 DIAGNOSIS — I129 Hypertensive chronic kidney disease with stage 1 through stage 4 chronic kidney disease, or unspecified chronic kidney disease: Secondary | ICD-10-CM | POA: Diagnosis not present

## 2017-05-16 DIAGNOSIS — Z7982 Long term (current) use of aspirin: Secondary | ICD-10-CM | POA: Diagnosis not present

## 2017-05-16 DIAGNOSIS — E782 Mixed hyperlipidemia: Secondary | ICD-10-CM

## 2017-05-16 DIAGNOSIS — J411 Mucopurulent chronic bronchitis: Secondary | ICD-10-CM

## 2017-05-16 DIAGNOSIS — Z79899 Other long term (current) drug therapy: Secondary | ICD-10-CM | POA: Insufficient documentation

## 2017-05-16 DIAGNOSIS — F17200 Nicotine dependence, unspecified, uncomplicated: Secondary | ICD-10-CM | POA: Diagnosis not present

## 2017-05-16 DIAGNOSIS — Z9049 Acquired absence of other specified parts of digestive tract: Secondary | ICD-10-CM | POA: Insufficient documentation

## 2017-05-16 DIAGNOSIS — R778 Other specified abnormalities of plasma proteins: Secondary | ICD-10-CM | POA: Diagnosis present

## 2017-05-16 LAB — COMPREHENSIVE METABOLIC PANEL
ALT: 16 U/L (ref 14–54)
ANION GAP: 12 (ref 5–15)
AST: 24 U/L (ref 15–41)
Albumin: 3.9 g/dL (ref 3.5–5.0)
Alkaline Phosphatase: 73 U/L (ref 38–126)
BUN: 11 mg/dL (ref 6–20)
CHLORIDE: 96 mmol/L — AB (ref 101–111)
CO2: 26 mmol/L (ref 22–32)
Calcium: 9.3 mg/dL (ref 8.9–10.3)
Creatinine, Ser: 1.33 mg/dL — ABNORMAL HIGH (ref 0.44–1.00)
GFR, EST AFRICAN AMERICAN: 48 mL/min — AB (ref >60)
GFR, EST NON AFRICAN AMERICAN: 42 mL/min — AB (ref >60)
Glucose, Bld: 121 mg/dL — ABNORMAL HIGH (ref 65–99)
Potassium: 2.2 mmol/L — CL (ref 3.5–5.1)
Sodium: 134 mmol/L — ABNORMAL LOW (ref 135–145)
Total Bilirubin: 0.8 mg/dL (ref 0.3–1.2)
Total Protein: 6.8 g/dL (ref 6.5–8.1)

## 2017-05-16 LAB — CBC WITH DIFFERENTIAL/PLATELET
BASOS PCT: 0 %
Basophils Absolute: 0 10*3/uL (ref 0.0–0.1)
Eosinophils Absolute: 0.1 10*3/uL (ref 0.0–0.7)
Eosinophils Relative: 1 %
HCT: 36.4 % (ref 36.0–46.0)
HEMOGLOBIN: 11.4 g/dL — AB (ref 12.0–15.0)
LYMPHS ABS: 1.4 10*3/uL (ref 0.7–4.0)
Lymphocytes Relative: 11 %
MCH: 24.7 pg — ABNORMAL LOW (ref 26.0–34.0)
MCHC: 31.3 g/dL (ref 30.0–36.0)
MCV: 78.8 fL (ref 78.0–100.0)
Monocytes Absolute: 1.2 10*3/uL — ABNORMAL HIGH (ref 0.1–1.0)
Monocytes Relative: 10 %
NEUTROS PCT: 78 %
Neutro Abs: 10 10*3/uL — ABNORMAL HIGH (ref 1.7–7.7)
Platelets: 227 10*3/uL (ref 150–400)
RBC: 4.62 MIL/uL (ref 3.87–5.11)
RDW: 16.8 % — ABNORMAL HIGH (ref 11.5–15.5)
WBC: 12.8 10*3/uL — AB (ref 4.0–10.5)

## 2017-05-16 LAB — PHOSPHORUS: PHOSPHORUS: 2.7 mg/dL (ref 2.5–4.6)

## 2017-05-16 LAB — MAGNESIUM: MAGNESIUM: 2.1 mg/dL (ref 1.7–2.4)

## 2017-05-16 LAB — TROPONIN I
Troponin I: 0.03 ng/mL (ref <0.03)
Troponin I: <0.03 ng/mL (ref <0.03)

## 2017-05-16 LAB — TSH: TSH: 1.173 u[IU]/mL (ref 0.350–4.500)

## 2017-05-16 MED ORDER — DOCUSATE SODIUM 100 MG PO CAPS
200.0000 mg | ORAL_CAPSULE | Freq: Every morning | ORAL | Status: DC
  Administered 2017-05-16 – 2017-05-18 (×3): 200 mg via ORAL
  Filled 2017-05-16 (×3): qty 2

## 2017-05-16 MED ORDER — SODIUM CHLORIDE 0.9 % IV SOLN
INTRAVENOUS | Status: AC
  Administered 2017-05-16: 10:00:00 via INTRAVENOUS

## 2017-05-16 MED ORDER — POTASSIUM CHLORIDE CRYS ER 20 MEQ PO TBCR
40.0000 meq | EXTENDED_RELEASE_TABLET | Freq: Once | ORAL | Status: DC
  Filled 2017-05-16: qty 2

## 2017-05-16 MED ORDER — HYDRALAZINE HCL 25 MG PO TABS
75.0000 mg | ORAL_TABLET | Freq: Three times a day (TID) | ORAL | Status: DC
  Administered 2017-05-16 – 2017-05-18 (×7): 75 mg via ORAL
  Filled 2017-05-16 (×7): qty 3

## 2017-05-16 MED ORDER — ASPIRIN EC 81 MG PO TBEC
81.0000 mg | DELAYED_RELEASE_TABLET | Freq: Every day | ORAL | Status: DC
  Administered 2017-05-16 – 2017-05-18 (×3): 81 mg via ORAL
  Filled 2017-05-16 (×3): qty 1

## 2017-05-16 MED ORDER — SODIUM CHLORIDE 0.9 % IV BOLUS (SEPSIS)
1000.0000 mL | Freq: Once | INTRAVENOUS | Status: AC
  Administered 2017-05-16: 1000 mL via INTRAVENOUS

## 2017-05-16 MED ORDER — NICOTINE 14 MG/24HR TD PT24
14.0000 mg | MEDICATED_PATCH | Freq: Every day | TRANSDERMAL | Status: DC
  Administered 2017-05-16 – 2017-05-18 (×3): 14 mg via TRANSDERMAL
  Filled 2017-05-16 (×3): qty 1

## 2017-05-16 MED ORDER — ISOSORBIDE MONONITRATE ER 60 MG PO TB24
120.0000 mg | ORAL_TABLET | Freq: Every day | ORAL | Status: DC
  Administered 2017-05-16 – 2017-05-18 (×3): 120 mg via ORAL
  Filled 2017-05-16 (×3): qty 2

## 2017-05-16 MED ORDER — ROSUVASTATIN CALCIUM 20 MG PO TABS
40.0000 mg | ORAL_TABLET | Freq: Every day | ORAL | Status: DC
Start: 2017-05-16 — End: 2017-05-18
  Administered 2017-05-16 – 2017-05-18 (×3): 40 mg via ORAL
  Filled 2017-05-16 (×3): qty 2

## 2017-05-16 MED ORDER — POTASSIUM CHLORIDE 10 MEQ/100ML IV SOLN
10.0000 meq | INTRAVENOUS | Status: AC
Start: 2017-05-16 — End: 2017-05-16
  Administered 2017-05-16 (×4): 10 meq via INTRAVENOUS
  Filled 2017-05-16 (×4): qty 100

## 2017-05-16 MED ORDER — NITROGLYCERIN 0.4 MG SL SUBL: 0.4000 mg | SUBLINGUAL_TABLET | SUBLINGUAL | Status: DC | PRN

## 2017-05-16 MED ORDER — CLOPIDOGREL BISULFATE 75 MG PO TABS
75.0000 mg | ORAL_TABLET | Freq: Every day | ORAL | Status: DC
  Administered 2017-05-16 – 2017-05-18 (×3): 75 mg via ORAL
  Filled 2017-05-16 (×3): qty 1

## 2017-05-16 MED ORDER — ACETAMINOPHEN 500 MG PO TABS: 1000.0000 mg | ORAL_TABLET | Freq: Four times a day (QID) | ORAL | Status: DC | PRN

## 2017-05-16 MED ORDER — SODIUM CHLORIDE 0.9% FLUSH
3.0000 mL | Freq: Two times a day (BID) | INTRAVENOUS | Status: DC
  Administered 2017-05-16 – 2017-05-18 (×4): 3 mL via INTRAVENOUS

## 2017-05-16 MED ORDER — POTASSIUM CHLORIDE 20 MEQ PO PACK
40.0000 meq | PACK | Freq: Once | ORAL | Status: AC
  Administered 2017-05-16: 40 meq via ORAL
  Filled 2017-05-16: qty 2

## 2017-05-16 MED ORDER — TIOTROPIUM BROMIDE MONOHYDRATE 18 MCG IN CAPS
1.0000 | ORAL_CAPSULE | Freq: Every day | RESPIRATORY_TRACT | Status: DC | PRN
  Filled 2017-05-16: qty 5

## 2017-05-16 MED ORDER — METOPROLOL TARTRATE 50 MG PO TABS
75.0000 mg | ORAL_TABLET | Freq: Two times a day (BID) | ORAL | Status: DC
  Administered 2017-05-16 – 2017-05-18 (×3): 75 mg via ORAL
  Filled 2017-05-16 (×5): qty 2

## 2017-05-16 MED ORDER — PANTOPRAZOLE SODIUM 40 MG PO TBEC
40.0000 mg | DELAYED_RELEASE_TABLET | Freq: Every day | ORAL | Status: DC
  Administered 2017-05-16 – 2017-05-18 (×3): 40 mg via ORAL
  Filled 2017-05-16 (×3): qty 1

## 2017-05-16 MED ORDER — HEPARIN SODIUM (PORCINE) 5000 UNIT/ML IJ SOLN
5000.0000 [IU] | Freq: Three times a day (TID) | INTRAMUSCULAR | Status: DC
  Administered 2017-05-16 – 2017-05-18 (×5): 5000 [IU] via SUBCUTANEOUS
  Filled 2017-05-16 (×6): qty 1

## 2017-05-16 MED ORDER — ONDANSETRON HCL 4 MG/2ML IJ SOLN: 4.0000 mg | Freq: Four times a day (QID) | INTRAMUSCULAR | Status: DC | PRN

## 2017-05-16 MED ORDER — ONDANSETRON HCL 4 MG PO TABS: 4.0000 mg | ORAL_TABLET | Freq: Four times a day (QID) | ORAL | Status: DC | PRN

## 2017-05-16 MED ORDER — ADULT MULTIVITAMIN W/MINERALS CH
1.0000 | ORAL_TABLET | Freq: Every day | ORAL | Status: DC
  Administered 2017-05-16 – 2017-05-18 (×3): 1 via ORAL
  Filled 2017-05-16 (×3): qty 1

## 2017-05-16 MED ORDER — AMLODIPINE BESYLATE 5 MG PO TABS
10.0000 mg | ORAL_TABLET | Freq: Every day | ORAL | Status: DC
  Administered 2017-05-16 – 2017-05-18 (×3): 10 mg via ORAL
  Filled 2017-05-16 (×3): qty 2

## 2017-05-16 MED ORDER — ALBUTEROL SULFATE (2.5 MG/3ML) 0.083% IN NEBU: 3.0000 mL | INHALATION_SOLUTION | Freq: Four times a day (QID) | RESPIRATORY_TRACT | Status: DC | PRN

## 2017-05-16 NOTE — H&P (Signed)
History and Physical    MCKAY BRANDT ZOX:[id number] DOB: [birth year] DOA: 05/16/2017  PCP: Sinda Du, MD   I have briefly reviewed patients previous medical reports in [facility]  Patient coming from: Home  Chief Complaint: Generalized weakness and feeling dizzy.  HPI: Christina Boyle is a 62 year old female with a past medical history significant for coronary artery disease a status post CABG (April 2017), history of cervical cancer, COPD, depression, essential hypertension, tobacco abuse (ongoing), hyperlipidemia, gastroesophageal reflux disease and class I obesity; who presented to the emergency department with acute complaints of generalized weakness and feeling dizzy.  Patient reports being in her normal state until 24 hours prior to admission when she developed these symptoms.  There has not been any focal motor or neurologic deficit, no dysarthria, no chest pain, no shortness of breath, no abdominal pain, no dysuria, no hematemesis, melena or hematochezia. Patient reports to be compliant with her medications and express concerns for potential electrolytes abnormalities (similar symptoms from prior ED visit roughly 1 month ago where she was found with hypokalemia).  Of note; Patient reports slight decrease in her p.o. intake due to lack of appetite in the last 48-72 hours.  ED Course: CT scan of the head negative for acute intracranial abnormalities, chest x-ray without acute cardiopulmonary process, blood work essentially normal except for potassium in the 2.2 range.  Mild elevation in her troponin (similar to prior troponins check), EKG with left ventricular hypertrophy with some repolarization abnormality and prolonged PR. Patient received IV fluids resuscitation and was started on IV and p.o. potassium.  [facility] hospitalist has been called to observe the patient and to further provide evaluation and treatment for her condition.  Review of Systems:  All other systems  reviewed and apart from HPI, are negative.  Past Medical History:  Diagnosis Date  . Bleeding ulcer 06/2015  . CAD (coronary artery disease)    Multivessel disease s/p CABG, DES SVG to OM  July 03 2015 (occluded SVG to PDA)  . Cervical cancer ([place])   . Chronic bronchitis   . Coarctation of aorta   . COPD (chronic obstructive pulmonary disease) ([place])   . Depression   . Essential hypertension   . GERD (gastroesophageal reflux disease)   . Heart murmur   . Hyperlipidemia   . NSTEMI (non-ST elevated myocardial infarction) [facility])    April 2017  . Obesity (BMI 30-39.9)     Past Surgical History:  Procedure Laterality Date  . BREAST BIOPSY Left    Benign  . CARDIAC CATHETERIZATION N/A 06/30/2015   Procedure: Left Heart Cath and Cors/Grafts Angiography;  Surgeon: Leonie Man, MD;  Location: [place] CV LAB;  Service: Cardiovascular;  Laterality: N/A;  . CARDIAC CATHETERIZATION N/A 07/03/2015   Procedure: Coronary Stent Intervention;  Surgeon: Peter M Martinique, MD;  Location: [place] CV LAB;  Service: Cardiovascular;  Laterality: N/A;  . CERVICAL CONE BIOPSY    . CHOLECYSTECTOMY    . CORONARY ARTERY BYPASS GRAFT  02/2006   LIMA to LAD, SVG to OM, SVG to PDA  . ESOPHAGOGASTRODUODENOSCOPY N/A 07/22/2014   Procedure: ESOPHAGOGASTRODUODENOSCOPY (EGD);  Surgeon: [name], MD;  Location: [facility] ENDOSCOPY;  Service: Endoscopy;  Laterality: N/A;  . ESOPHAGOGASTRODUODENOSCOPY N/A 07/12/2015   Procedure: ESOPHAGOGASTRODUODENOSCOPY (EGD);  Surgeon: Rogene Houston, MD;  [facility];  Service: Endoscopy;  Laterality: N/A;  . ESOPHAGOGASTRODUODENOSCOPY N/A 10/18/2015   Procedure: ESOPHAGOGASTRODUODENOSCOPY (EGD);  Surgeon: Rogene Houston, MD;  Location: [facility]  ENDO SUITE;  Service: Endoscopy;  Laterality: N/A;  2:55    Social History  reports that she has been smoking cigarettes.  She started smoking about 48 years ago. She has a 50.00 pack-year smoking history. she has never used  smokeless tobacco. She reports that she does not drink alcohol or use drugs.  No Known Allergies  Family History  Problem Relation Age of Onset  . Heart attack Father 7  . Lung cancer Mother 68    Prior to Admission medications   Medication Sig Start Date End Date Taking? Authorizing Provider  acetaminophen (TYLENOL) 500 MG tablet Take 1,000 mg by mouth every 6 (six) hours as needed for moderate pain.    [provider]  albuterol (PROVENTIL HFA;VENTOLIN HFA) 108 (90 Base) MCG/ACT inhaler Inhale 1-2 puffs into the lungs every 6 (six) hours as needed for wheezing or shortness of breath. 05/27/16   Long, Wonda Olds, MD  amLODipine (NORVASC) 10 MG tablet Take 1 tablet (10 mg total) by mouth daily. 12/25/16 04/01/17  [name], MD  aspirin EC 81 MG EC tablet Take 1 tablet (81 mg total) by mouth daily. 07/23/14   [name], MD  clopidogrel (PLAVIX) 75 MG tablet TAKE ONE TABLET BY MOUTH ONCE DAILY WITH BREAKFAST 07/08/16   [name], MD  docusate sodium (COLACE) 100 MG capsule Take 200 mg by mouth every morning.    [provider]  furosemide (LASIX) 40 MG tablet Take 1 tablet (40 mg total) by mouth daily. 10/26/15   Elgergawy, Silver Huguenin, MD  hydrALAZINE (APRESOLINE) 50 MG tablet Take 1.5 tablets (75 mg total) 3 (three) times daily by mouth. 01/23/17 04/23/17  [name], MD  isosorbide mononitrate (IMDUR) 120 MG 24 hr tablet TAKE 1 TABLET BY MOUTH ONCE DAILY 03/31/17   [name], MD  Metoprolol Tartrate 75 MG TABS Take 75 mg by mouth 2 (two) times daily.    [provider]  Multiple Vitamin (MULTIVITAMIN WITH MINERALS) TABS tablet Take 1 tablet by mouth daily.    [provider]  nitroGLYCERIN (NITROSTAT) 0.4 MG SL tablet Place 1 tablet (0.4 mg total) every 5 (five) minutes as needed under the tongue for chest pain. 01/13/17   [name], MD  pantoprazole (PROTONIX) 40 MG tablet TAKE 1 TABLET BY MOUTH ONCE DAILY BEFORE  BREAKFAST 04/25/17   Rehman, Mechele Dawley, MD  potassium chloride SA (K-DUR,KLOR-CON) 20 MEQ tablet Take 1 tablet (20 mEq total) by mouth 2 (two) times daily. 04/01/17   Daleen Bo, MD  rosuvastatin (CRESTOR) 40 MG tablet Take 1 tablet (40 mg total) daily by mouth. 01/14/17 04/14/17  [name], MD  Tiotropium Bromide Monohydrate (SPIRIVA RESPIMAT) 2.5 MCG/ACT AERS Inhale 1 puff into the lungs daily as needed (or shortness of breath).     [provider]    Physical Exam: Vitals:   05/16/17 0530 05/16/17 0630 05/16/17 0700 05/16/17 0730  BP: (!) 175/55 (!) 174/54 (!) 155/57 (!) 167/53  Pulse: (!) 58 60 (!) 59 61  Resp: 18 18 14 18   Temp:      TempSrc:      SpO2: 90% 93% 92% 90%  Weight:      Height:          Constitutional: Afebrile, no acute distress, feeling weak and reported mild nausea.  Patient denies chest pain, shortness of breath and abdominal pain. Eyes: PERTLA, lids and conjunctivae normal, no icterus, no nystagmus ENMT: Mucous membranes  slightly dry on exam. Posterior pharynx clear of any exudate or lesions. No thrush Neck: supple, no masses, no thyromegaly, no JVD Respiratory: Good air movement bilaterally, normal respiratory effort, scattered rhonchi appreciated, currently without any wheezing or crackles.   Cardiovascular: S1 & S2 heard, regular rate and rhythm, no rubs, no gallops; positive systolic ejection murmur, no extremity edema. No carotid bruits.  Abdomen: No distension, no tenderness, no masses palpated. No hepatosplenomegaly. Bowel sounds normal.  Musculoskeletal: no clubbing / cyanosis. No joint deformity upper and lower extremities. Good ROM, no contractures. Normal muscle tone.  Skin: no rashes, lesions, ulcers. No induration, no open wounds. Neurologic: CN 2-12 grossly intact. Sensation intact, DTR normal. Strength 4/5 in all 4 limbs in the setting of poor effort. Psychiatric: Normal judgment and insight. Alert and oriented x 3. Normal mood.    Labs on Admission: I have personally reviewed following labs and imaging studies  CBC: Recent Labs  Lab 05/16/17 0610  WBC 12.8*  NEUTROABS 10.0*  HGB 11.4*  HCT 36.4  MCV 78.8  PLT 106   Basic Metabolic Panel: Recent Labs  Lab 05/16/17 0610  NA 134*  K 2.2*  CL 96*  CO2 26  GLUCOSE 121*  BUN 11  CREATININE 1.33*  CALCIUM 9.3  MG 2.1   Liver Function Tests: Recent Labs  Lab 05/16/17 0610  AST 24  ALT 16  ALKPHOS 73  BILITOT 0.8  PROT 6.8  ALBUMIN 3.9   Cardiac Enzymes: Recent Labs  Lab 05/16/17 0610  TROPONINI 0.03*   Urine analysis:    Component Value Date/Time   COLORURINE YELLOW 06/04/2016 1938   APPEARANCEUR CLEAR 06/04/2016 1938   LABSPEC 1.009 06/04/2016 1938   PHURINE 7.0 06/04/2016 1938   GLUCOSEU NEGATIVE 06/04/2016 1938   HGBUR NEGATIVE 06/04/2016 1938   BILIRUBINUR NEGATIVE 06/04/2016 1938   KETONESUR NEGATIVE 06/04/2016 1938   PROTEINUR NEGATIVE 06/04/2016 1938   UROBILINOGEN 0.2 07/21/2014 0333   NITRITE NEGATIVE 06/04/2016 1938   LEUKOCYTESUR NEGATIVE 06/04/2016 1938    Radiological Exams on Admission: No results found.  EKG: BH with repolarization, normal rate, prolonged PR, no acute ischemic changes.  Assessment/Plan 1-weakness/dizziness: In the setting of dehydration and hypokalemia. -Patient will receive fluid resuscitation -Her diuretics will be held overnight -Electrolytes will be repleted -Follow basic metabolic panel in the morning -Reassess clinical response. -Patient will benefit of potassium maintenance supplementation at discharge. -Magnesium and phosphorus within normal limits. -admitted to telemetry bed   2-Hyperlipemia -Continue statins  3-TOBACCO ABUSE -Smoking cessation counseling provided -Nicotine patch order  4-Hypertension -Resume home antihypertensive regimen except for diuretics at this moment. -Follow blood pressure and adjust antihypertensive meds as needed.  5-COPD (chronic obstructive  pulmonary disease) (HCC) -Currently stable and compensated -Patient denies shortness of breath and is currently no wheezing -Continue home inhaler regimen. -Encouraged to quit smoking.  6-Elevated troponin -She denies chest pain shortness of breath -Troponin mildly elevated in the past as well -Will monitor on telemetry -Cycle troponin -EKG currently without any acute ischemic changes.  7-GERD (gastroesophageal reflux disease) -Continue PPI   Time: 60 minutes    DVT prophylaxis: Heparin Code Status: Full code Family Communication: No family at bedside Disposition Plan: Anticipate discharge back home most likely on 05/17/17 after electrolytes are repleted Consults called: None Admission status: Observation, LOS less than 2 midnights, telemetry   Barton Dubois MD [facility] Pager [phone]  If 7PM-7AM, please contact night-coverage [email or web address] Password [facility]  05/16/2017, 7:49 AM

## 2017-05-16 NOTE — ED Notes (Signed)
Critical result. Potassium 2.2. Trop 0.03. EDP [place] notified.

## 2017-05-16 NOTE — ED Triage Notes (Signed)
Per EMS pt C/O dizziness X 1 week. Pt states she has a "stomach ache" and her "arms and legs feel weak."

## 2017-05-16 NOTE — ED Provider Notes (Signed)
[facility] EMERGENCY DEPARTMENT Provider Note   CSN: [id number] Arrival date & time: 05/16/17  0865  Time seen 05:50 AM    History   Chief Complaint Chief Complaint  Patient presents with  . Dizziness    HPI Christina Boyle is a 62 y.o. female.  HPI patient states for the past week off and on she feels like her arms and her legs are weak, she is having trouble walking.  She feels dizzy when she stands up like she is going to pass out and denies a spinning sensation.  She has had nausea without vomiting and has a decreased appetite.  She states she only eats a couple mouthfuls and then she quit eating because she feels full.  She denies diarrhea, chest pain, or shortness of breath.  She has had a headache off and on in the frontal area that she describes as aching.  She states she has been having some lower abdominal pain off and on the same period of time that lasted a few minutes at a time.  She denies dysuria or frequency.  She complains of her mouth being dry.  PCP Sinda Du, MD   Past Medical History:  Diagnosis Date  . Bleeding ulcer 06/2015  . CAD (coronary artery disease)    Multivessel disease s/p CABG, DES SVG to OM  July 03 2015 (occluded SVG to PDA)  . Cervical cancer ([place])   . Chronic bronchitis   . Coarctation of aorta   . COPD (chronic obstructive pulmonary disease) ([place])   . Depression   . Essential hypertension   . GERD (gastroesophageal reflux disease)   . Heart murmur   . Hyperlipidemia   . NSTEMI (non-ST elevated myocardial infarction) [facility])    April 2017  . Obesity (BMI 30-39.9)     Patient Active Problem List   Diagnosis Date Noted  . Hypokalemia 10/25/2015  . CAD (coronary artery disease) 10/25/2015  . H. pylori infection 10/25/2015  . CKD (chronic kidney disease) stage 3, GFR 30-59 ml/min (HCC) 07/14/2015  . Thrombocytopenia ([place]) 07/12/2015  . Hypovolemia 07/12/2015  . AKI (acute kidney injury) ([place]) 07/11/2015  . Acute upper GI  bleed 07/11/2015  . Obesity (BMI 30-39.9) 06/30/2015  . Hx of CABG 2007 06/30/2015  . Hypertensive cardiovascular disease 06/30/2015  . Aortic stenosis-mild to moderate 06/30/2015  . Coronary artery disease involving native heart with unstable angina pectoris ([place]) 06/30/2015  . NSTEMI (non-ST elevated myocardial infarction) ([place]) 06/29/2015  . COPD exacerbation ([place]) 06/27/2015  . Acute gastric ulcer with hemorrhage   . Elevated troponin   . Upper gastrointestinal bleed 07/21/2014  . Symptomatic anemia 07/21/2014  . Chest pain 07/21/2014  . Acute renal failure ([place]) 07/21/2014  . Azotemia 07/21/2014  . Abnormal urinalysis 07/21/2014  . COPD (chronic obstructive pulmonary disease) ([place]) 07/21/2014  . Acute blood loss anemia 07/21/2014  . Hypertension 07/31/2011  . Carotid bruit 07/31/2011  . CERVICAL CANCER 04/04/2009  . Hyperlipemia 04/04/2009  . TOBACCO ABUSE 04/04/2009  . COARCTATION OF AORTA 04/04/2009  . FATIGUE 04/04/2009    Past Surgical History:  Procedure Laterality Date  . BREAST BIOPSY Left    Benign  . CARDIAC CATHETERIZATION N/A 06/30/2015   Procedure: Left Heart Cath and Cors/Grafts Angiography;  Surgeon: Leonie Man, MD;  Location: [place] CV LAB;  Service: Cardiovascular;  Laterality: N/A;  . CARDIAC CATHETERIZATION N/A 07/03/2015   Procedure: Coronary Stent Intervention;  Surgeon: Peter M Martinique, MD;  Location: [place] [facility]  LAB;  Service: Cardiovascular;  Laterality: N/A;  . CERVICAL CONE BIOPSY    . CHOLECYSTECTOMY    . CORONARY ARTERY BYPASS GRAFT  02/2006   LIMA to LAD, SVG to OM, SVG to PDA  . ESOPHAGOGASTRODUODENOSCOPY N/A 07/22/2014   Procedure: ESOPHAGOGASTRODUODENOSCOPY (EGD);  Surgeon: [name], MD;  Location: [facility] ENDOSCOPY;  Service: Endoscopy;  Laterality: N/A;  . ESOPHAGOGASTRODUODENOSCOPY N/A 07/12/2015   Procedure: ESOPHAGOGASTRODUODENOSCOPY (EGD);  Surgeon: Rogene Houston, MD;  [facility];  Service: Endoscopy;   Laterality: N/A;  . ESOPHAGOGASTRODUODENOSCOPY N/A 10/18/2015   Procedure: ESOPHAGOGASTRODUODENOSCOPY (EGD);  Surgeon: Rogene Houston, MD;  Location: [facility] ENDO SU[facility];  Service: Endoscopy;  Laterality: N/A;  2:55    OB History    Gravida Para Term Preterm AB Living   5 5 5          SAB TAB Ectopic Multiple Live Births                   Home Medications    Prior to Admission medications   Medication Sig Start Date End Date Taking? Authorizing Provider  acetaminophen (TYLENOL) 500 MG tablet Take 1,000 mg by mouth every 6 (six) hours as needed for moderate pain.    [provider]  albuterol (PROVENTIL HFA;VENTOLIN HFA) 108 (90 Base) MCG/ACT inhaler Inhale 1-2 puffs into the lungs every 6 (six) hours as needed for wheezing or shortness of breath. 05/27/16   Long, Wonda Olds, MD  amLODipine (NORVASC) 10 MG tablet Take 1 tablet (10 mg total) by mouth daily. 12/25/16 04/01/17  [name], MD  aspirin EC 81 MG EC tablet Take 1 tablet (81 mg total) by mouth daily. 07/23/14   [name], MD  clopidogrel (PLAVIX) 75 MG tablet TAKE ONE TABLET BY MOUTH ONCE DAILY WITH BREAKFAST 07/08/16   [name], MD  docusate sodium (COLACE) 100 MG capsule Take 200 mg by mouth every morning.    [provider]  furosemide (LASIX) 40 MG tablet Take 1 tablet (40 mg total) by mouth daily. 10/26/15   Elgergawy, Silver Huguenin, MD  hydrALAZINE (APRESOLINE) 50 MG tablet Take 1.5 tablets (75 mg total) 3 (three) times daily by mouth. 01/23/17 04/23/17  [name], MD  isosorbide mononitrate (IMDUR) 120 MG 24 hr tablet TAKE 1 TABLET BY MOUTH ONCE DAILY 03/31/17   [name], MD  Metoprolol Tartrate 75 MG TABS Take 75 mg by mouth 2 (two) times daily.    [provider]  Multiple Vitamin (MULTIVITAMIN WITH MINERALS) TABS tablet Take 1 tablet by mouth daily.    [provider]  nitroGLYCERIN (NITROSTAT) 0.4 MG SL tablet Place 1 tablet (0.4 mg total) every 5 (five)  minutes as needed under the tongue for chest pain. 01/13/17   [name], MD  pantoprazole (PROTONIX) 40 MG tablet TAKE 1 TABLET BY MOUTH ONCE DAILY BEFORE BREAKFAST 04/25/17   Rehman, Mechele Dawley, MD  potassium chloride SA (K-DUR,KLOR-CON) 20 MEQ tablet Take 1 tablet (20 mEq total) by mouth 2 (two) times daily. 04/01/17   Daleen Bo, MD  rosuvastatin (CRESTOR) 40 MG tablet Take 1 tablet (40 mg total) daily by mouth. 01/14/17 04/14/17  [name], MD  Tiotropium Bromide Monohydrate (SPIRIVA RESPIMAT) 2.5 MCG/ACT AERS Inhale 1 puff into the lungs daily as needed (or shortness of breath).     [provider]    Family History Family History  Problem Relation Age of Onset  . Heart attack  Father 66  . Lung cancer Mother 74    Social History Social History   Tobacco Use  . Smoking status: Current Every Day Smoker    Packs/day: 1.00    Years: 50.00    Pack years: 50.00    Types: Cigarettes    Start date: 05/21/1969  . Smokeless tobacco: Never Used  Substance Use Topics  . Alcohol use: No    Alcohol/week: 0.0 oz  . Drug use: No  on disability for COPD   Allergies   Patient has no known allergies.   Review of Systems Review of Systems  All other systems reviewed and are negative.    Physical Exam Updated Vital Signs BP (!) 175/55   Pulse (!) 58   Temp 98.4 F (36.9 C) (Oral)   Resp 18   Ht 5\' 2"  (1.575 m)   Wt 78.9 kg (174 lb)   SpO2 90%   BMI 31.83 kg/m   Vital signs normal except bradycardia and hypertension  Orthostatic VS for the past 24 hrs:  BP- Lying Pulse- Lying BP- Sitting Pulse- Sitting BP- Standing at 0 minutes Pulse- Standing at 0 minutes  05/16/17 0347 155/46 59 153/59 63 127/53 65    Orthostatic vital signs normal except for hypertension, and she does have a significant drop in her blood pressure on standing,  please note patient is on metoprolol and may not be able to increase her heart rate.    Physical Exam    Constitutional: She is oriented to person, place, and time. She appears well-developed and well-nourished.  Non-toxic appearance. She does not appear ill. No distress.  HENT:  Head: Normocephalic and atraumatic.  Right Ear: External ear normal.  Left Ear: External ear normal.  Nose: Nose normal. No mucosal edema or rhinorrhea.  Mouth/Throat: Mucous membranes are dry. No dental abscesses or uvula swelling.  Eyes: Conjunctivae and EOM are normal. Pupils are equal, round, and reactive to light.  Neck: Normal range of motion and full passive range of motion without pain. Neck supple.  Cardiovascular: Normal rate, regular rhythm and normal heart sounds. Exam reveals no gallop and no friction rub.  No murmur heard. Pulmonary/Chest: Effort normal and breath sounds normal. No respiratory distress. She has no wheezes. She has no rhonchi. She has no rales. She exhibits no tenderness and no crepitus.  Abdominal: Soft. Normal appearance and bowel sounds are normal. She exhibits no distension. There is no tenderness. There is no rebound and no guarding.  Musculoskeletal: Normal range of motion. She exhibits no edema or tenderness.  Moves all extremities well.   Neurological: She is alert and oriented to person, place, and time. She has normal strength. No cranial nerve deficit.  Skin: Skin is warm, dry and intact. No rash noted. No erythema. No pallor.  Psychiatric: She has a normal mood and affect. Her speech is normal and behavior is normal. Her mood appears not anxious.  Nursing note and vitals reviewed.    ED Treatments / Results  Labs (all labs ordered are listed, but only abnormal results are displayed) Results for orders placed or performed during the hospital encounter of 05/16/17  Comprehensive metabolic panel  Result Value Ref Range   Sodium 134 (L) 135 - 145 mmol/L   Potassium 2.2 (LL) 3.5 - 5.1 mmol/L   Chloride 96 (L) 101 - 111 mmol/L   CO2 26 22 - 32 mmol/L   Glucose, Bld 121 (H)  65 - 99 mg/dL   BUN 11 6 -  20 mg/dL   Creatinine, Ser 1.33 (H) 0.44 - 1.00 mg/dL   Calcium 9.3 8.9 - 10.3 mg/dL   Total Protein 6.8 6.5 - 8.1 g/dL   Albumin 3.9 3.5 - 5.0 g/dL   AST 24 15 - 41 U/L   ALT 16 14 - 54 U/L   Alkaline Phosphatase 73 38 - 126 U/L   Total Bilirubin 0.8 0.3 - 1.2 mg/dL   GFR calc non Af Amer 42 (L) >60 mL/min   GFR calc Af Amer 48 (L) >60 mL/min   Anion gap 12 5 - 15  CBC with Differential  Result Value Ref Range   WBC 12.8 (H) 4.0 - 10.5 K/uL   RBC 4.62 3.87 - 5.11 MIL/uL   Hemoglobin 11.4 (L) 12.0 - 15.0 g/dL   HCT 36.4 36.0 - 46.0 %   MCV 78.8 78.0 - 100.0 fL   MCH 24.7 (L) 26.0 - 34.0 pg   MCHC 31.3 30.0 - 36.0 g/dL   RDW 16.8 (H) 11.5 - 15.5 %   Platelets 227 150 - 400 K/uL   Neutrophils Relative % 78 %   Neutro Abs 10.0 (H) 1.7 - 7.7 K/uL   Lymphocytes Relative 11 %   Lymphs Abs 1.4 0.7 - 4.0 K/uL   Monocytes Relative 10 %   Monocytes Absolute 1.2 (H) 0.1 - 1.0 K/uL   Eosinophils Relative 1 %   Eosinophils Absolute 0.1 0.0 - 0.7 K/uL   Basophils Relative 0 %   Basophils Absolute 0.0 0.0 - 0.1 K/uL  Troponin I  Result Value Ref Range   Troponin I 0.03 (HH) <0.03 ng/mL   Laboratory interpretation all normal except hypokalemia, new renal insufficiency   EKG  EKG Interpretation None     ED ECG REPORT   Date: 05/16/2017  Rate: 60  Rhythm: normal sinus rhythm  QRS Axis: right  Intervals: Prolonged PR  ST/T Wave abnormalities: [place]  Conduction Disutrbances:LVH with repolarization abnormality  Narrative Interpretation:   Old EKG Reviewed: none available  I have personally reviewed the EKG tracing and agree with the computerized printout as noted.   Radiology No results found.  Procedures .Critical Care Performed by: Rolland Porter, MD Authorized by: Rolland Porter, MD   Critical care provider statement:    Critical care time (minutes):  31   Critical care was necessary to treat or prevent imminent or life-threatening deterioration  of the following conditions:  Cardiac failure, dehydration and endocrine crisis   Critical care was time spent personally by me on the following activities:  Discussions with consultants, examination of patient, obtaining history from patient or surrogate and review of old charts   (including critical care time)  Medications Ordered in ED Medications  potassium chloride 10 mEq in 100 mL IVPB (10 mEq Intravenous New Bag/Given 05/16/17 0711)  potassium chloride SA (K-DUR,KLOR-CON) CR tablet 40 mEq (40 mEq Oral Not Given 05/16/17 0711)  sodium chloride 0.9 % bolus 1,000 mL (1,000 mLs Intravenous New Bag/Given 05/16/17 0711)  sodium chloride 0.9 % bolus 1,000 mL (0 mLs Intravenous Stopped 05/16/17 0711)     Initial Impression / Assessment and Plan / ED Course  I have reviewed the triage vital signs and the nursing notes.  Pertinent labs & imaging results that were available during my care of the patient were reviewed by me and considered in my medical decision making (see chart for details).     Patient appeared to be dehydrated.  She was given IV fluids.  Laboratory testing  was done.  After reviewing patient's laboratory tests results she was given potassium 40 mEq orally and started on IV potassium runs x4.  Magnesium level was added to her blood work.  Patient was just seen in the ED on January 22 and given IV and oral potassium and sent out as an outpatient.  Patient states her doctors put her on diuretics and do not give her potassium pills.  This is now several episodes that she is come to the ED with hypokalemia.  There may be some other underlying etiology of her hypokalemia besides the diuretics.   07:10 AM Dr Dyann Kief, hospitalist will admit   Final Clinical Impressions(s) / ED Diagnoses   Final diagnoses:  Hypokalemia  Weakness  Dizziness    Plan admission  Rolland Porter, MD, Barbette Or, MD 05/16/17 [phone]

## 2017-05-17 LAB — URINALYSIS, ROUTINE W REFLEX MICROSCOPIC
BILIRUBIN URINE: NEGATIVE
GLUCOSE, UA: NEGATIVE mg/dL
HGB URINE DIPSTICK: NEGATIVE
KETONES UR: NEGATIVE mg/dL
Leukocytes, UA: NEGATIVE
Nitrite: NEGATIVE
PH: 7 (ref 5.0–8.0)
Protein, ur: NEGATIVE mg/dL
SPECIFIC GRAVITY, URINE: 1.005 (ref 1.005–1.030)

## 2017-05-17 LAB — MAGNESIUM: MAGNESIUM: 2.1 mg/dL (ref 1.7–2.4)

## 2017-05-17 LAB — BASIC METABOLIC PANEL
ANION GAP: 9 (ref 5–15)
BUN: 8 mg/dL (ref 6–20)
CHLORIDE: 107 mmol/L (ref 101–111)
CO2: 22 mmol/L (ref 22–32)
Calcium: 8.8 mg/dL — ABNORMAL LOW (ref 8.9–10.3)
Creatinine, Ser: 1.08 mg/dL — ABNORMAL HIGH (ref 0.44–1.00)
GFR calc Af Amer: >60 mL/min (ref >60)
GFR, EST NON AFRICAN AMERICAN: 53 mL/min — AB (ref >60)
GLUCOSE: 135 mg/dL — AB (ref 65–99)
POTASSIUM: 2.6 mmol/L — AB (ref 3.5–5.1)
Sodium: 138 mmol/L (ref 135–145)

## 2017-05-17 LAB — TROPONIN I
Troponin I: 0.03 ng/mL (ref <0.03)
Troponin I: <0.03 ng/mL (ref <0.03)

## 2017-05-17 LAB — HIV ANTIBODY (ROUTINE TESTING W REFLEX): HIV Screen 4th Generation wRfx: NONREACTIVE

## 2017-05-17 MED ORDER — POTASSIUM CHLORIDE CRYS ER 20 MEQ PO TBCR
40.0000 meq | EXTENDED_RELEASE_TABLET | Freq: Three times a day (TID) | ORAL | Status: DC
  Administered 2017-05-17 – 2017-05-18 (×4): 40 meq via ORAL
  Filled 2017-05-17 (×5): qty 2

## 2017-05-17 NOTE — Progress Notes (Signed)
CRITICAL VALUE ALERT  Critical Value: Potassium 2.6  Date & Time Notied:  05/17/2017, 0830  Provider Notified: [phone]

## 2017-05-17 NOTE — Plan of Care (Signed)
  Progressing Health Behavior/Discharge Planning: Ability to manage health-related needs will improve 05/17/2017 1054 - Progressing by Tashiba Timoney, Demetrio Lapping, RN Clinical Measurements: Ability to maintain clinical measurements within normal limits will improve 05/17/2017 1054 - Progressing by Berton Bon, RN Will remain free from infection 05/17/2017 1054 - Progressing by Berton Bon, RN Diagnostic test results will improve 05/17/2017 1054 - Progressing by Berton Bon, RN Respiratory complications will improve 05/17/2017 1054 - Progressing by Berton Bon, RN Cardiovascular complication will be avoided 05/17/2017 1054 - Progressing by Berton Bon, RN Safety: Ability to remain free from injury will improve 05/17/2017 1054 - Progressing by Shericka Johnstone, Demetrio Lapping, RN

## 2017-05-17 NOTE — Progress Notes (Signed)
Subjective: She says she feels better.  She was admitted with a low potassium.  Her magnesium level was okay.  Her potassium is still low this morning.  No other new complaints.  No chest pain.  No nausea vomiting diarrhea.  Her breathing is doing okay.  Objective: Vital signs in last 24 hours: Temp:  [98.3 F (36.8 C)-98.8 F (37.1 C)] 98.8 F (37.1 C) (03/09 0441) Pulse Rate:  [57-64] 64 (03/09 0441) Resp:  [16] 16 (03/08 1431) BP: (163-171)/(46-50) 163/50 (03/09 0441) SpO2:  [95 %-98 %] 95 % (03/09 0441) Weight:  [80.2 kg (176 lb 12.9 oz)] 80.2 kg (176 lb 12.9 oz) (03/09 0441) Weight change: -4.226 kg (-5.1 oz) Last BM Date: 05/13/17  Intake/Output from previous day: 03/08 0701 - 03/09 0700 In: 1715.5 [P.O.:360; I.V.:355.5; IV Piggyback:1000] Out: -   PHYSICAL EXAM General appearance: alert, cooperative and no distress Resp: clear to auscultation bilaterally Cardio: regular rate and rhythm, S1, S2 normal, no murmur, click, rub or gallop GI: soft, non-tender; bowel sounds normal; no masses,  no organomegaly Extremities: extremities normal, atraumatic, no cyanosis or edema Skin warm and dry  Lab Results:  Results for orders placed or performed during the hospital encounter of 05/16/17 (from the past 48 hour(s))  Comprehensive metabolic panel     Status: Abnormal   Collection Time: 05/16/17  6:10 AM  Result Value Ref Range   Sodium 134 (L) 135 - 145 mmol/L   Potassium 2.2 (LL) 3.5 - 5.1 mmol/L    Comment: CRITICAL RESULT CALLED TO, READ BACK BY AND VERIFIED WITH: DOSS,M AT 6:45AM ON 05/16/17 BY FESTERMAN,C    Chloride 96 (L) 101 - 111 mmol/L   CO2 26 22 - 32 mmol/L   Glucose, Bld 121 (H) 65 - 99 mg/dL   BUN 11 6 - 20 mg/dL   Creatinine, Ser 1.33 (H) 0.44 - 1.00 mg/dL   Calcium 9.3 8.9 - 10.3 mg/dL   Total Protein 6.8 6.5 - 8.1 g/dL   Albumin 3.9 3.5 - 5.0 g/dL   AST 24 15 - 41 U/L   ALT 16 14 - 54 U/L   Alkaline Phosphatase 73 38 - 126 U/L   Total Bilirubin 0.8 0.3  - 1.2 mg/dL   GFR calc non Af Amer 42 (L) >60 mL/min   GFR calc Af Amer 48 (L) >60 mL/min    Comment: (NOTE) The eGFR has been calculated using the CKD EPI equation. This calculation has not been validated in all clinical situations. eGFR's persistently <60 mL/min signify possible Chronic Kidney Disease.    Anion gap 12 5 - 15    Comment: Performed at [facility], [address], [place], [place] [address]  CBC with Differential     Status: Abnormal   Collection Time: 05/16/17  6:10 AM  Result Value Ref Range   WBC 12.8 (H) 4.0 - 10.5 K/uL   RBC 4.62 3.87 - 5.11 MIL/uL   Hemoglobin 11.4 (L) 12.0 - 15.0 g/dL   HCT 36.4 36.0 - 46.0 %   MCV 78.8 78.0 - 100.0 fL   MCH 24.7 (L) 26.0 - 34.0 pg   MCHC 31.3 30.0 - 36.0 g/dL   RDW 16.8 (H) 11.5 - 15.5 %   Platelets 227 150 - 400 K/uL   Neutrophils Relative % 78 %   Neutro Abs 10.0 (H) 1.7 - 7.7 K/uL   Lymphocytes Relative 11 %   Lymphs Abs 1.4 0.7 - 4.0 K/uL   Monocytes Relative 10 %  Monocytes Absolute 1.2 (H) 0.1 - 1.0 K/uL   Eosinophils Relative 1 %   Eosinophils Absolute 0.1 0.0 - 0.7 K/uL   Basophils Relative 0 %   Basophils Absolute 0.0 0.0 - 0.1 K/uL    Comment: Performed at [facility], [address], [place], [place] [address]  Troponin I     Status: Abnormal   Collection Time: 05/16/17  6:10 AM  Result Value Ref Range   Troponin I 0.03 (HH) <0.03 ng/mL    Comment: CRITICAL RESULT CALLED TO, READ BACK BY AND VERIFIED WITH: DOSS,M AT 6:45AM ON 05/16/17 BY [facility] Performed at [facility], [address] [place], [place] [address]   Magnesium     Status: None   Collection Time: 05/16/17  6:10 AM  Result Value Ref Range   Magnesium 2.1 1.7 - 2.4 mg/dL    Comment: Performed at [facility], [address], [place], [place] [address]  TSH     Status: None   Collection Time: 05/16/17  6:10 AM  Result Value Ref Range   TSH 1.173 0.350 - 4.500 uIU/mL    Comment: Performed by a 3rd Generation assay with a functional  sensitivity of <=0.01 uIU/mL. Performed at [facility], [address], [place], [place] [address]   HIV antibody (Routine Testing)     Status: None   Collection Time: 05/16/17  8:31 AM  Result Value Ref Range   HIV Screen 4th Generation wRfx Non Reactive Non Reactive    Comment: (NOTE) Performed At: [facility] [place], [place] [id number] Rush Farmer MD [id number] Performed at [facility], [address] [place], [place] [address]   Phosphorus     Status: None   Collection Time: 05/16/17  8:31 AM  Result Value Ref Range   Phosphorus 2.7 2.5 - 4.6 mg/dL    Comment: Performed at [facility], [address], [place], [place] [address]  Troponin I (q 6hr x 3)     Status: None   Collection Time: 05/16/17  6:19 PM  Result Value Ref Range   Troponin I <0.03 <0.03 ng/mL    Comment: Performed at [facility], [address] [place], [place] [address]  Troponin I (q 6hr x 3)     Status: Abnormal   Collection Time: 05/17/17 12:19 AM  Result Value Ref Range   Troponin I 0.03 (HH) <0.03 ng/mL    Comment: CRITICAL RESULT CALLED TO, READ BACK BY AND VERIFIED WITH: VILLALOBOS,S 0103 ON 05/17/17 BY JUW Performed at [facility], [address] [place], [place] [address]   Basic metabolic panel     Status: Abnormal   Collection Time: 05/17/17  6:23 AM  Result Value Ref Range   Sodium 138 135 - 145 mmol/L   Potassium 2.6 (LL) 3.5 - 5.1 mmol/L    Comment: CRITICAL RESULT CALLED TO, READ BACK BY AND VERIFIED WITH: BULLINS L. @ 0829 ON [id number] BY HENDERSON L.    Chloride 107 101 - 111 mmol/L   CO2 22 22 - 32 mmol/L   Glucose, Bld 135 (H) 65 - 99 mg/dL   BUN 8 6 - 20 mg/dL   Creatinine, Ser 1.08 (H) 0.44 - 1.00 mg/dL   Calcium 8.8 (L) 8.9 - 10.3 mg/dL   GFR calc non Af Amer 53 (L) >60 mL/min   GFR calc Af Amer >60 >60 mL/min    Comment: (NOTE) The eGFR has been calculated using the CKD EPI equation. This calculation has not been  validated in all clinical  situations. eGFR's persistently <60 mL/min signify possible Chronic Kidney Disease.    Anion gap 9 5 - 15    Comment: Performed at [facility], [address], [place], [place] [address]  Magnesium     Status: None   Collection Time: 05/17/17  7:09 AM  Result Value Ref Range   Magnesium 2.1 1.7 - 2.4 mg/dL    Comment: Performed at [facility], [address], [place], [place] [address]    ABGS No results for input(s): PHART, PO2ART, TCO2, HCO3 in the last 72 hours.  Invalid input(s): PCO2 CULTURES No results found for this or any previous visit (from the past 240 hour(s)). Studies/Results: No results found.  Medications:  Prior to Admission:  Medications Prior to Admission  Medication Sig Dispense Refill Last Dose  . acetaminophen (TYLENOL) 500 MG tablet Take 1,000 mg by mouth every 6 (six) hours as needed for moderate pain.   05/15/2017 at Unknown time  . albuterol (PROVENTIL HFA;VENTOLIN HFA) 108 (90 Base) MCG/ACT inhaler Inhale 1-2 puffs into the lungs every 6 (six) hours as needed for wheezing or shortness of breath. 1 Inhaler 0 Past Week at Unknown time  . amLODipine (NORVASC) 10 MG tablet Take 1 tablet (10 mg total) by mouth daily. 90 tablet 3 05/15/2017 at Unknown time  . aspirin EC 81 MG EC tablet Take 1 tablet (81 mg total) by mouth daily.   05/15/2017 at Unknown time  . clopidogrel (PLAVIX) 75 MG tablet TAKE ONE TABLET BY MOUTH ONCE DAILY WITH BREAKFAST 90 tablet 3 05/15/2017 at 0830  . furosemide (LASIX) 40 MG tablet Take 1 tablet (40 mg total) by mouth daily. 60 tablet 3 05/15/2017 at Unknown time  . isosorbide mononitrate (IMDUR) 120 MG 24 hr tablet TAKE 1 TABLET BY MOUTH ONCE DAILY 30 tablet 6 05/15/2017 at Unknown time  . metoprolol tartrate (LOPRESSOR) 50 MG tablet Take 75 mg by mouth 2 (two) times daily.   05/15/2017 at 1930t  . nitroGLYCERIN (NITROSTAT) 0.4 MG SL tablet Place 1 tablet (0.4 mg total) every 5 (five) minutes as needed under the tongue for chest pain. 25 tablet 3  unknown  . pantoprazole (PROTONIX) 40 MG tablet TAKE 1 TABLET BY MOUTH ONCE DAILY BEFORE BREAKFAST 30 tablet 5 05/15/2017 at Unknown time  . rosuvastatin (CRESTOR) 40 MG tablet Take 1 tablet (40 mg total) daily by mouth. 90 tablet 3 05/15/2017 at Unknown time  . Tiotropium Bromide Monohydrate (SPIRIVA RESPIMAT) 2.5 MCG/ACT AERS Inhale 1 puff into the lungs daily as needed (or shortness of breath).    Past Week at Unknown time  . hydrALAZINE (APRESOLINE) 50 MG tablet Take 1.5 tablets (75 mg total) 3 (three) times daily by mouth. (Patient taking differently: Take 75 mg by mouth 2 (two) times daily. ) 135 tablet 3 04/01/2017 at Unknown time   Scheduled: . amLODipine  10 mg Oral Daily  . aspirin EC  81 mg Oral Daily  . clopidogrel  75 mg Oral Daily  . docusate sodium  200 mg Oral q morning - 10a  . heparin  5,000 Units Subcutaneous Q8H  . hydrALAZINE  75 mg Oral TID  . isosorbide mononitrate  120 mg Oral Daily  . metoprolol tartrate  75 mg Oral BID  . multivitamin with minerals  1 tablet Oral Daily  . nicotine  14 mg Transdermal Daily  . pantoprazole  40 mg Oral Daily  . potassium chloride SA  40 mEq Oral Once  .  potassium chloride  40 mEq Oral TID  . rosuvastatin  40 mg Oral Daily  . sodium chloride flush  3 mL Intravenous Q12H   Continuous:  CQP:[id number], albuterol, nitroGLYCERIN, ondansetron **OR** ondansetron (ZOFRAN) IV, tiotropium  Assesment: She was admitted with hypokalemia.  Her magnesium was okay.  She had potassium replacement but her potassium is still low.  She has been taking a diuretic at home and although there was a prescription written for potassium apparently that has not been filled.  She will need to have a potassium prescription when she goes home.  She has elevated troponin which is about the level of her chronic elevated troponin.  She has coronary disease which is stable.  She has COPD which is stable. Principal Problem:   Hypokalemia Active Problems:    Hyperlipemia   TOBACCO ABUSE   Hypertension   COPD (chronic obstructive pulmonary disease) (HCC)   Elevated troponin   GERD (gastroesophageal reflux disease)   Dizziness    Plan: Continue with current treatments.  Add increased oral potassium.  Recheck in the morning.  Potential discharge tomorrow if her potassium level is okay    LOS: 0 days   Christina Boyle L 05/17/2017, 9:44 AM

## 2017-05-18 LAB — BASIC METABOLIC PANEL
Anion gap: 8 (ref 5–15)
BUN: 7 mg/dL (ref 6–20)
CHLORIDE: 111 mmol/L (ref 101–111)
CO2: 22 mmol/L (ref 22–32)
CREATININE: 1.01 mg/dL — AB (ref 0.44–1.00)
Calcium: 9.3 mg/dL (ref 8.9–10.3)
GFR calc Af Amer: >60 mL/min (ref >60)
GFR calc non Af Amer: 58 mL/min — ABNORMAL LOW (ref >60)
Glucose, Bld: 112 mg/dL — ABNORMAL HIGH (ref 65–99)
Potassium: 3.9 mmol/L (ref 3.5–5.1)
SODIUM: 141 mmol/L (ref 135–145)

## 2017-05-18 LAB — MAGNESIUM: MAGNESIUM: 2.4 mg/dL (ref 1.7–2.4)

## 2017-05-18 MED ORDER — POTASSIUM CHLORIDE CRYS ER 20 MEQ PO TBCR
20.0000 meq | EXTENDED_RELEASE_TABLET | Freq: Every day | ORAL | Status: DC
  Administered 2017-05-18: 20 meq via ORAL

## 2017-05-18 MED ORDER — POTASSIUM CHLORIDE CRYS ER 20 MEQ PO TBCR: 20.0000 meq | EXTENDED_RELEASE_TABLET | Freq: Every day | ORAL | 1 refills | Status: DC

## 2017-05-18 MED ORDER — DOCUSATE SODIUM 100 MG PO CAPS: 200.0000 mg | ORAL_CAPSULE | Freq: Every morning | ORAL | 0 refills | Status: DC

## 2017-05-18 MED ORDER — FUROSEMIDE 40 MG PO TABS: 20.0000 mg | ORAL_TABLET | Freq: Every day | ORAL | 3 refills | Status: DC

## 2017-05-18 NOTE — Progress Notes (Signed)
Patient has platelets dropping from 328-122 consideration of heparin-induced thrombocytopenia discussed with pharmacy.  Suggestions are to discontinue Lovenox as well as Zyvox Maxipime kept on board.  Will check CBC with platelet count in a.m. to see trending of platelets. Christina Boyle [id number] DOB: 1955-03-22 DOA: 05/16/2017 PCP: Sinda Du, MD   Physical Exam: Blood pressure (!) 148/66, pulse 64, temperature 98.1 F (36.7 C), temperature source Oral, resp. rate 18, height 5\' 2"  (1.575 m), weight 80.1 kg (176 lb 9.4 oz), SpO2 94 %.  Lungs show minimal inspiratory and expiratory wheeze scattered rhonchi no rales audible heart regular rhythm no S3-S4 no heaves thrills or rubs   Investigations:  No results found for this or any previous visit (from the past 240 hour(s)).   Basic Metabolic Panel: Recent Labs    05/16/17 0831 05/17/17 0623 05/17/17 0709 05/18/17 0622  NA  --  138  --  141  K  --  2.6*  --  3.9  CL  --  107  --  111  CO2  --  22  --  22  GLUCOSE  --  135*  --  112*  BUN  --  8  --  7  CREATININE  --  1.08*  --  1.01*  CALCIUM  --  8.8*  --  9.3  MG  --   --  2.1 2.4  PHOS 2.7  --   --   --    Liver Function Tests: Recent Labs    05/16/17 0610  AST 24  ALT 16  ALKPHOS 73  BILITOT 0.8  PROT 6.8  ALBUMIN 3.9     CBC: Recent Labs    05/16/17 0610  WBC 12.8*  NEUTROABS 10.0*  HGB 11.4*  HCT 36.4  MCV 78.8  PLT 227    No results found.    Medications: }  Impression:  Principal Problem:   Hypokalemia Active Problems:   Hyperlipemia   TOBACCO ABUSE   Hypertension   COPD (chronic obstructive pulmonary disease) (HCC)   Elevated troponin   GERD (gastroesophageal reflux disease)   Dizziness     Plan: Discontinue Lovenox and Zyvox for possible H IT.  CBC with platelet count in a.m.  Continue Maxipime which is sensitive to E. coli organism.  Continue steroids as  ordered  Consultants: Pharmacy   Procedures   Antibiotics: Cefepime.  Zyvox discontinued today           Time spent: 45 minutes   LOS: 0 days   Sisto Granillo M   05/18/2017, 12:04 PM

## 2017-05-18 NOTE — Progress Notes (Signed)
IV removed, WNL. D/C instructions given to pt. Verbalized understanding. Pt awaiting son to transport home.

## 2017-05-18 NOTE — Discharge Summary (Signed)
Physician Discharge Summary  Christina Boyle [id number] DOB: August 21, 1955 DOA: 05/16/2017  PCP: Sinda Du, MD  Admit date: 05/16/2017 Discharge date: 05/18/2017   Recommendations for Outpatient Follow-up: Pt. Advised to reduce lasix from 40 mg to 20 mg PO daily New RX given. Also to take kdur 20 meq PO daily and follow up with Dr Luan Pulling in one week to check potassium . Smoking cessation advised . She refused Chantix  Discharge Diagnoses:  Principal Problem:   Hypokalemia Active Problems:   Hyperlipemia   TOBACCO ABUSE   Hypertension   COPD (chronic obstructive pulmonary disease) (HCC)   Elevated troponin   GERD (gastroesophageal reflux disease)   Dizziness   Discharge Condition: good  Filed Weights   05/16/17 0855 05/17/17 0441 05/18/17 0648  Weight: 74.7 kg (164 lb 10.9 oz) 80.2 kg (176 lb 12.9 oz) 80.1 kg (176 lb 9.4 oz)    History of present illness:  Pt with s/p Cabg x 3 2007 and subsequent stenting who has htn, hyperlipidemia came in on diuretics and did not fill kcl Rx with dizzyness , hypovolemia and hypokalemia. This was rectified orally and she was hemodynamically stable her lasix was lowered to 20 m PO daily and KCL added 20 meq PO Daily I stressed the importance of lower diuretics and Kcl.  Hospital Course:  See hpi above.  Procedures:     Consultations:    Discharge Instructions  Discharge Instructions    Discharge instructions   Complete by:  As directed    Discharge patient   Complete by:  As directed    Discharge disposition:  01-Home or Self Care   Discharge patient date:  05/18/2017     Allergies as of 05/18/2017   No Known Allergies     Medication List    TAKE these medications   acetaminophen 500 MG tablet Commonly known as:  TYLENOL Take 1,000 mg by mouth every 6 (six) hours as needed for moderate pain.   albuterol 108 (90 Base) MCG/ACT inhaler Commonly known as:  PROVENTIL HFA;VENTOLIN HFA Inhale 1-2 puffs into the lungs  every 6 (six) hours as needed for wheezing or shortness of breath.   amLODipine 10 MG tablet Commonly known as:  NORVASC Take 1 tablet (10 mg total) by mouth daily.   aspirin 81 MG EC tablet Take 1 tablet (81 mg total) by mouth daily.   clopidogrel 75 MG tablet Commonly known as:  PLAVIX TAKE ONE TABLET BY MOUTH ONCE DAILY WITH BREAKFAST   docusate sodium 100 MG capsule Commonly known as:  COLACE Take 2 capsules (200 mg total) by mouth every morning. Start taking on:  05/19/2017   furosemide 40 MG tablet Commonly known as:  LASIX Take 0.5 tablets (20 mg total) by mouth daily. What changed:  how much to take   hydrALAZINE 50 MG tablet Commonly known as:  APRESOLINE Take 1.5 tablets (75 mg total) 3 (three) times daily by mouth. What changed:  when to take this   isosorbide mononitrate 120 MG 24 hr tablet Commonly known as:  IMDUR TAKE 1 TABLET BY MOUTH ONCE DAILY   metoprolol tartrate 50 MG tablet Commonly known as:  LOPRESSOR Take 75 mg by mouth 2 (two) times daily.   nitroGLYCERIN 0.4 MG SL tablet Commonly known as:  NITROSTAT Place 1 tablet (0.4 mg total) every 5 (five) minutes as needed under the tongue for chest pain.   pantoprazole 40 MG tablet Commonly known as:  PROTONIX TAKE 1 TABLET BY  MOUTH ONCE DAILY BEFORE BREAKFAST   potassium chloride SA 20 MEQ tablet Commonly known as:  K-DUR,KLOR-CON Take 1 tablet (20 mEq total) by mouth daily.   rosuvastatin 40 MG tablet Commonly known as:  CRESTOR Take 1 tablet (40 mg total) daily by mouth.   SPIRIVA RESPIMAT 2.5 MCG/ACT Aers Generic drug:  Tiotropium Bromide Monohydrate Inhale 1 puff into the lungs daily as needed (or shortness of breath).      No Known Allergies    The results of significant diagnostics from this hospitalization (including imaging, microbiology, ancillary and laboratory) are listed below for reference.    Significant Diagnostic Studies: No results found.  Microbiology: No results  found for this or any previous visit (from the past 240 hour(s)).   Labs: Basic Metabolic Panel: Recent Labs  Lab 05/16/17 0610 05/16/17 0831 05/17/17 6213 05/17/17 0709 05/18/17 0622  NA 134*  --  138  --  141  K 2.2*  --  2.6*  --  3.9  CL 96*  --  107  --  111  CO2 26  --  22  --  22  GLUCOSE 121*  --  135*  --  112*  BUN 11  --  8  --  7  CREATININE 1.33*  --  1.08*  --  1.01*  CALCIUM 9.3  --  8.8*  --  9.3  MG 2.1  --   --  2.1 2.4  PHOS  --  2.7  --   --   --    Liver Function Tests: Recent Labs  Lab 05/16/17 0610  AST 24  ALT 16  ALKPHOS 73  BILITOT 0.8  PROT 6.8  ALBUMIN 3.9   No results for input(s): LIPASE, AMYLASE in the last 168 hours. No results for input(s): AMMONIA in the last 168 hours. CBC: Recent Labs  Lab 05/16/17 0610  WBC 12.8*  NEUTROABS 10.0*  HGB 11.4*  HCT 36.4  MCV 78.8  PLT 227   Cardiac Enzymes: Recent Labs  Lab 05/16/17 0610 05/16/17 1819 05/17/17 0019 05/17/17 0623  TROPONINI 0.03* <0.03 0.03* <0.03   BNP: BNP (last 3 results) No results for input(s): BNP in the last 8760 hours.  ProBNP (last 3 results) No results for input(s): PROBNP in the last 8760 hours.  CBG: No results for input(s): GLUCAP in the last 168 hours.     Signed:  Maricela Curet   Pager: [phone] 05/18/2017, 11:24 AM

## 2017-05-30 ENCOUNTER — Encounter: Payer: Self-pay | Admitting: Pulmonary Disease

## 2017-07-08 ENCOUNTER — Other Ambulatory Visit: Payer: Self-pay | Admitting: Cardiology

## 2017-07-11 ENCOUNTER — Encounter: Payer: Self-pay | Admitting: Cardiology

## 2017-07-11 ENCOUNTER — Ambulatory Visit: Payer: Medicaid Other | Admitting: Cardiology

## 2017-07-11 VITALS — BP 181/64 | HR 66 | Ht 62.0 in | Wt 171.0 lb

## 2017-07-11 DIAGNOSIS — I2510 Atherosclerotic heart disease of native coronary artery without angina pectoris: Secondary | ICD-10-CM | POA: Diagnosis not present

## 2017-07-11 DIAGNOSIS — I350 Nonrheumatic aortic (valve) stenosis: Secondary | ICD-10-CM | POA: Diagnosis not present

## 2017-07-11 DIAGNOSIS — E782 Mixed hyperlipidemia: Secondary | ICD-10-CM

## 2017-07-11 DIAGNOSIS — I10 Essential (primary) hypertension: Secondary | ICD-10-CM | POA: Diagnosis not present

## 2017-07-11 MED ORDER — EZETIMIBE 10 MG PO TABS: 10.0000 mg | ORAL_TABLET | Freq: Every day | ORAL | 3 refills | Status: DC

## 2017-07-11 MED ORDER — FUROSEMIDE 20 MG PO TABS: 20.0000 mg | ORAL_TABLET | Freq: Every day | ORAL | 3 refills | Status: DC

## 2017-07-11 MED ORDER — HYDRALAZINE HCL 100 MG PO TABS: 100.0000 mg | ORAL_TABLET | Freq: Three times a day (TID) | ORAL | 3 refills | Status: DC

## 2017-07-11 MED ORDER — FUROSEMIDE 40 MG PO TABS: 20.0000 mg | ORAL_TABLET | Freq: Every day | ORAL | 3 refills | Status: DC

## 2017-07-11 NOTE — Progress Notes (Signed)
Clinical Summary Christina Boyle is a 62 y.o.female seen today for follow up of the following medical problems.    1. CAD  - prior CABG in 2007 3 vessel (LIMA to LAD, SVG to OM, SVG to PDA) - Echo 12/2012 LVEF 60-65%  - 01/2014 exercise MPI: did not reach THR, exercise limited by SOB and chest pain. Converted to [employer]. Apical anterior and anterolateral ischemia, intermediate risk. LVEF 62% - 06/2015 cath received stent to SVG-OM suboptimal result. If continued symptoms consider PCI ofRCA 06/2015 echo: LVEF 65-70%, no WMAs, grade I diastolic dysfunction, mild to moderate AS   - no recent chest pain, no SOB/DOE - compliant with meds.    2. HTN  - compliant with meds   3. Hyperlipidemia - 06/2016 TC 234 TG 152 HDL 47 LDL 157  - 12/2016 TC 216 HDL 52 TG 136 LDL 138  - reports strict compliance with crestor 40mg  daily.   4.Aortic stenosis 12/2016 echo mild to moderate AS  - denies any symptoms   5. Carotid stenosis - carotid US 01/2015 with bilateral 1-39% disease.  - no recent symptoms. Bruits likely due to radiation of her AS murmur  6. Hypokalemia - related to diuretics in the past - recent admission with hypokalemia, lasix decreased to 20mg  daily - she reports most recent labs with pcp  7.AKI - recent admission with AKI and hypokalemia, lasix was lowered to 20mg  daily.    SH: enjoys spending time outside, planting flowers.     Past Medical History:  Diagnosis Date  . Bleeding ulcer 06/2015  . CAD (coronary artery disease)    Multivessel disease s/p CABG, DES SVG to OM  July 03 2015 (occluded SVG to PDA)  . Cervical cancer ([place])   . Chronic bronchitis   . Coarctation of aorta   . COPD (chronic obstructive pulmonary disease) ([place])   . Depression   . Essential hypertension   . GERD (gastroesophageal reflux disease)   . Heart murmur   . Hyperlipidemia   . NSTEMI (non-ST elevated myocardial infarction) [facility])    April 2017  . Obesity (BMI  30-39.9)      No Known Allergies   Current Outpatient Medications  Medication Sig Dispense Refill  . acetaminophen (TYLENOL) 500 MG tablet Take 1,000 mg by mouth every 6 (six) hours as needed for moderate pain.    Marland Kitchen albuterol (PROVENTIL HFA;VENTOLIN HFA) 108 (90 Base) MCG/ACT inhaler Inhale 1-2 puffs into the lungs every 6 (six) hours as needed for wheezing or shortness of breath. 1 Inhaler 0  . amLODipine (NORVASC) 10 MG tablet Take 1 tablet (10 mg total) by mouth daily. 90 tablet 3  . aspirin EC 81 MG EC tablet Take 1 tablet (81 mg total) by mouth daily.    . clopidogrel (PLAVIX) 75 MG tablet TAKE 1 TABLET BY MOUTH ONCE DAILY WITH BREAKFAST 90 tablet 3  . docusate sodium (COLACE) 100 MG capsule Take 2 capsules (200 mg total) by mouth every morning. 10 capsule 0  . furosemide (LASIX) 40 MG tablet Take 0.5 tablets (20 mg total) by mouth daily. 60 tablet 3  . hydrALAZINE (APRESOLINE) 50 MG tablet Take 1.5 tablets (75 mg total) 3 (three) times daily by mouth. (Patient taking differently: Take 75 mg by mouth 2 (two) times daily. ) 135 tablet 3  . isosorbide mononitrate (IMDUR) 120 MG 24 hr tablet TAKE 1 TABLET BY MOUTH ONCE DAILY 30 tablet 6  . metoprolol tartrate (LOPRESSOR) 50 MG tablet  Take 75 mg by mouth 2 (two) times daily.    . nitroGLYCERIN (NITROSTAT) 0.4 MG SL tablet Place 1 tablet (0.4 mg total) every 5 (five) minutes as needed under the tongue for chest pain. 25 tablet 3  . pantoprazole (PROTONIX) 40 MG tablet TAKE 1 TABLET BY MOUTH ONCE DAILY BEFORE BREAKFAST 30 tablet 5  . potassium chloride SA (K-DUR,KLOR-CON) 20 MEQ tablet Take 1 tablet (20 mEq total) by mouth daily. 30 tablet 1  . rosuvastatin (CRESTOR) 40 MG tablet Take 1 tablet (40 mg total) daily by mouth. 90 tablet 3  . Tiotropium Bromide Monohydrate (SPIRIVA RESPIMAT) 2.5 MCG/ACT AERS Inhale 1 puff into the lungs daily as needed (or shortness of breath).      No current facility-administered medications for this visit.       Past Surgical History:  Procedure Laterality Date  . BREAST BIOPSY Left    Benign  . CARDIAC CATHETERIZATION N/A 06/30/2015   Procedure: Left Heart Cath and Cors/Grafts Angiography;  Surgeon: Leonie Man, MD;  Location: [place] [facility];  Service: Cardiovascular;  Laterality: N/A;  . CARDIAC CATHETERIZATION N/A 07/03/2015   Procedure: Coronary Stent Intervention;  Surgeon: Peter M Martinique, MD;  Location: [place] [facility];  Service: Cardiovascular;  Laterality: N/A;  . CERVICAL CONE BIOPSY    . CHOLECYSTECTOMY    . CORONARY ARTERY BYPASS GRAFT  02/2006   LIMA to LAD, SVG to OM, SVG to PDA  . ESOPHAGOGASTRODUODENOSCOPY N/A 07/22/2014   Procedure: ESOPHAGOGASTRODUODENOSCOPY (EGD);  Surgeon: [name], MD;  Location: [facility] ENDOSCOPY;  Service: Endoscopy;  Laterality: N/A;  . ESOPHAGOGASTRODUODENOSCOPY N/A 07/12/2015   Procedure: ESOPHAGOGASTRODUODENOSCOPY (EGD);  Surgeon: Rogene Houston, MD;  Location: [facility];  Service: Endoscopy;  Laterality: N/A;  . ESOPHAGOGASTRODUODENOSCOPY N/A 10/18/2015   Procedure: ESOPHAGOGASTRODUODENOSCOPY (EGD);  Surgeon: Rogene Houston, MD;  Location: [facility];  Service: Endoscopy;  Laterality: N/A;  2:55     No Known Allergies    Family History  Problem Relation Age of Onset  . Heart attack Father 41  . Lung cancer Mother 38     Social History Ms. Scheirer reports that she has been smoking cigarettes.  She started smoking about 48 years ago. She has a 50.00 pack-year smoking history. She has never used smokeless tobacco. Ms. Toops reports that she does not drink alcohol.   Review of Systems CONSTITUTIONAL: No weight loss, fever, chills, weakness or fatigue.  HEENT: Eyes: No visual loss, blurred vision, double vision or yellow sclerae.No hearing loss, sneezing, congestion, runny nose or sore throat.  SKIN: No rash or itching.  CARDIOVASCULAR: per hpi RESPIRATORY: No shortness of breath, cough or sputum.  GASTROINTESTINAL: No  anorexia, nausea, vomiting or diarrhea. No abdominal pain or blood.  GENITOURINARY: No burning on urination, no polyuria NEUROLOGICAL: No headache, dizziness, syncope, paralysis, ataxia, numbness or tingling in the extremities. No change in bowel or bladder control.  MUSCULOSKELETAL: No muscle, back pain, joint pain or stiffness.  LYMPHATICS: No enlarged nodes. No history of splenectomy.  PSYCHIATRIC: No history of depression or anxiety.  ENDOCRINOLOGIC: No reports of sweating, cold or heat intolerance. No polyuria or polydipsia.  Marland Kitchen   Physical Examination Vitals:   07/11/17 1121  BP: (!) 181/64  Pulse: 66  SpO2: 95%   Vitals:   07/11/17 1121  Weight: 171 lb (77.6 kg)  Height: 5\' 2"  (1.575 m)    Gen: resting comfortably, no acute distress HEENT: no scleral icterus, pupils equal round  and reactive, no palptable cervical adenopathy,  CV: RRR, 3/6 systolic murmur rusb. Bilateral carotid bruits Resp: Clear to auscultation bilaterally GI: abdomen is soft, non-tender, non-distended, normal bowel sounds, no hepatosplenomegaly MSK: extremities are warm, no edema.  Skin: warm, no rash Neuro:  no focal deficits Psych: appropriate affect   Diagnostic Studies 12/2012 Echo LVEF 60-65%, mild to mod LVH, normal diastolic function, mild AS (valve area 1.5 VTI, mean grad 10), RV function mildly reduced   07/2011 Carotid US 1. Bilateral carotid bifurcation and proximal ICA plaque, resulting in less than 50% diameter stenosis. The exam does not exclude plaque ulceration or embolization. Continued surveillance recommended.  02/2006 Cath FINDINGS:  1. LV: 143/17/26. EF 65% without regional wall motion abnormality.  2. No aortic stenosis or mitral regurgitation.  3. Left main: There is an 80% stenosis of the mid vessel.  4. LAD: Moderate-size vessel giving rise to two small proximal  diagonals and a larger third diagonal. The LAD has heavy  calcification proximally and  diffuse mild disease. The third  diagonal has a 70% stenosis proximally. A small medial subdivision  of this has a 99% stenosis.  5. Circumflex: Moderate-sized vessel giving rise to a single  marginal. There is a 70% stenosis in the AV groove portion of the  vessel.  6. RCA: Moderate-size, dominant vessel. There is an 80% ostial  stenosis, a 40% stenosis of the mid vessel, and a 90% stenosis just  before the origin of the PDA.  7. Left subclavian artery: Diffuse 30% stenosis proximally. The LIMA  is widely patent.  8. Aortic coarctation with 30 mmHg translesional gradient.  9. Normal great vessel anatomy. There is mild ostial stenosis of the  left common carotid artery.  IMPRESSION/RECOMMENDATIONS:  1. Severe multivessel coronary disease.  2. Coarctation of aorta.  3. Normal left ventricular systolic function.  Will refer the to cardiac surgery for consideration of coronary artery  bypass grafting and repair of her coarctation.   Jan 2015 PFTs Reason for pulmonary function testing is shortness of breath.  1. Spirometry shows a moderate ventilatory defect with minimal airflow  obstruction.  2. Lung volumes show air trapping.  3. DLCO is normal.  4. Airway resistance is high confirming the presence of airflow  obstruction.  5. No significant bronchodilator improvement.  6. Arterial blood gas shows relative resting hypoxia.  7. This study is consistent with COPD considering the patient's  smoking history.   10/2013 Carotid US IMPRESSION: 1. Slight interval progression of left-sided heterogeneous atherosclerotic plaque which now results in an estimated 50- 69% diameter ICA stenosis. 2. Stable heterogeneous atherosclerotic plaque on the right resulting in less than 50% diameter ICA stenosis. 3. Vertebral arteries are patent with normal antegrade flow.  01/2014 Lexiscan MPI IMPRESSION: 1. Abnormal study. There is evidence of  apical anterior/anterolateral ischemia. Borderline TID ratio increased at 1.2 may also be indicative of subendocardial or balanced ischemia.  2. Normal left ventricular wall motion.  3. Left ventricular ejection fraction 62%  4. Intermediate-risk stress test findings*.   07/2014 echo Study Conclusions  - Left ventricle: The cavity size was normal. Wall thickness was increased in a pattern of mild LVH. Systolic function was normal. The estimated ejection fraction was in the range of 55% to 60%. Wall motion was normal; there were no regional wall motion abnormalities. Doppler parameters are consistent with elevated ventricular end-diastolic filling pressure. - Aortic valve: Valve area (VTI): 1.36 cm^2. Valve area (Vmax): 1.26 cm^2. Valve area (Vmean): 1.29 cm^2. -  Mitral valve: There was mild regurgitation. Valve area by continuity equation (using LVOT flow): 2.15 cm^2. - Left atrium: The atrium was mildly dilated. - Atrial septum: No defect or patent foramen ovale was identified.  06/2015 cath 1. Ost RCA lesion, 95% stenosed. Mid RCA lesion, 50% stenosed. Dist RCA lesion, 70% stenosed. Post Atrio lesion, 80% stenosed. 2. 100% occluded SVG-RPDA at the origin 3. Ost LM to LM lesion, 60% stenosed. 4. Likely ostial left main disease but with brisk antegrade flow in the LAD system. Most notable lesion is the diagonal lesion. 5. 3rd Diag lesion, 80% stenosed. 6. Ost Cx to Prox Cx lesion, 80% stenosed. Ost 1st Mrg to 1st Mrg lesion, 80% stenosed. 7. SVG-OM is widely patent with the exception of a 99% stenosis in the origin 8. LIMA was visualized by non-selective angiography due to inability to cannulate and . Significant disease in that portion of the left subclavian artery.   The patient truly has 2 culprit lesions: Ostial SVG-OM and the occluded SVG-RCA with now ostial RCA disease. Both lesions are likely be difficult PCI. As the diagnostic procedure was somewhat  difficult as far as engaging the LIMA graft, and significant amount of contrast was used with recent renal insufficiency, I felt it best to stop the procedure today with plans for having the patient return for 2 site PCI early next week.  Plan:  Patient will return to nursing unit for continued medical care.  Would start heparin back 6-8 hours following sheath removal.  I have loaded with Plavix 300 mg and ordered 75 mg daily.  She needs aggressive blood pressure management.  06/2015 PCI  Origin lesion, 99% stenosed. Post intervention, there is a 20% residual stenosis.  1. Successful but suboptimal stenting of the ostial SVG to the OM due to difficulty positioning the stent at the origin. As a result the stent was deployed but only the distal portion of the stent was in the SVG.   Plan: continue DAPT indefinitely. Would postpone any further PCI and allow stent to re- endothelialize. If she does have refractory symptoms I would consider her for PCI of the native RCA with rotational atherectomy at a later date. If asymptomatic I would treat this medically.   12/2016 echo Study Conclusions  - Left ventricle: The cavity size was normal. Wall thickness was   increased in a pattern of mild LVH. Systolic function was   vigorous. The estimated ejection fraction was in the range of 65%   to 70%. Wall motion was normal; there were no regional wall   motion abnormalities. Features are consistent with a pseudonormal   left ventricular filling pattern, with concomitant abnormal   relaxation and increased filling pressure (grade 2 diastolic   dysfunction). - Aortic valve: Leaflet structure not well defined. Moderately   calcified. There was mild to moderate stenosis. Mean gradient   (S): 13 mm Hg. Peak gradient (S): 23 mm Hg. VTI ratio of LVOT to   aortic valve: 0.51. Valve area (VTI): 1.46 cm^2. Valve area   (Vmax): 1.52 cm^2. Valve area (Vmean): 1.54 cm^2. - Mitral valve: Calcified  annulus. There was mild regurgitation. - Left atrium: The atrium was mildly dilated. - Right atrium: Central venous pressure (est): 3 mm Hg. - Atrial septum: No defect or patent foramen ovale was identified. - Tricuspid valve: There was trivial regurgitation. - Pulmonary arteries: Systolic pressure could not be accurately   estimated. - Pericardium, extracardiac: There was no pericardial effusion.  Impressions:  - Mild  LVH with LVEF 65-70% and grade 2 diastolic dysfunction. Mild   left atrial enlargement. Mild calcified mitral annulus with mild   mitral regurgitation. Aortic valve structure is not well-defined,   there is moderate calcification and mild to moderate aortic   stenosis based on gradients and valve area. Trivial tricuspid   regurgitation.    Assessment and Plan  1. CAD  - no recent symptoms, continue currnent meds  2. HTN -elevated in clinic, increase hydaral to 100mg  tid - submit bp log in in 1 week  3. Hyperlipidemia - she strictly endorses compliance with crestor 40mg  daily. With that her LDL is not at goal., [facility] has known CAD, we will start zetia 10mg  daily.   4. Aortic stenosis - mild to moderate by last echo, conitnue to monitor.    F/u 4 months       [name], M.D.

## 2017-07-11 NOTE — Patient Instructions (Addendum)
Your physician recommends that you schedule a follow-up appointment in: 4 months with Dr.Branch     START Zetia 10 mg daily   INCREASE Hydralazine 100 mg three times daily   DECREASE Lasix to 20 mg daily   Keep BP log for one week    No lab work or tests ordered today     Thank you for choosing [facility] !

## 2017-07-16 ENCOUNTER — Other Ambulatory Visit (HOSPITAL_COMMUNITY): Payer: Self-pay | Admitting: Pulmonary Disease

## 2017-07-16 DIAGNOSIS — Z1231 Encounter for screening mammogram for malignant neoplasm of breast: Secondary | ICD-10-CM

## 2017-08-08 ENCOUNTER — Ambulatory Visit (HOSPITAL_COMMUNITY)
Admission: RE | Admit: 2017-08-08 | Discharge: 2017-08-08 | Disposition: A | Discharge status: Home / Self Care | Payer: Medicaid Other | Source: Ambulatory Visit | Attending: Pulmonary Disease | Admitting: Pulmonary Disease

## 2017-08-08 ENCOUNTER — Encounter (HOSPITAL_COMMUNITY): Payer: Self-pay

## 2017-08-08 DIAGNOSIS — Z1231 Encounter for screening mammogram for malignant neoplasm of breast: Secondary | ICD-10-CM | POA: Diagnosis not present

## 2017-08-08 LAB — HM MAMMOGRAPHY

## 2017-08-11 ENCOUNTER — Telehealth: Payer: Self-pay | Admitting: *Deleted

## 2017-08-11 DIAGNOSIS — I10 Essential (primary) hypertension: Secondary | ICD-10-CM

## 2017-08-11 MED ORDER — LOSARTAN POTASSIUM 25 MG PO TABS: 25.0000 mg | ORAL_TABLET | Freq: Every day | ORAL | 3 refills | Status: DC

## 2017-08-11 NOTE — Telephone Encounter (Signed)
Patient informed and verbalized understanding of plan. 

## 2017-08-11 NOTE — Telephone Encounter (Signed)
-----   Message from [name], MD sent at 08/11/2017 12:38 PM EDT ----- BP log reviewed from last month, bp's too high. Please start her on losartan 25mg  daily, check BMET in 2 weeks   Zandra Abts MD

## 2017-08-26 ENCOUNTER — Other Ambulatory Visit (HOSPITAL_COMMUNITY)
Admission: RE | Admit: 2017-08-26 | Discharge: 2017-08-26 | Disposition: A | Discharge status: Home / Self Care | Payer: Medicaid Other | Source: Ambulatory Visit | Attending: Cardiology | Admitting: Cardiology

## 2017-08-26 DIAGNOSIS — I10 Essential (primary) hypertension: Secondary | ICD-10-CM | POA: Diagnosis not present

## 2017-08-26 LAB — BASIC METABOLIC PANEL
ANION GAP: 9 (ref 5–15)
BUN: 14 mg/dL (ref 6–20)
CHLORIDE: 105 mmol/L (ref 101–111)
CO2: 25 mmol/L (ref 22–32)
Calcium: 9.3 mg/dL (ref 8.9–10.3)
Creatinine, Ser: 1.22 mg/dL — ABNORMAL HIGH (ref 0.44–1.00)
GFR calc non Af Amer: 46 mL/min — ABNORMAL LOW (ref >60)
GFR, EST AFRICAN AMERICAN: 53 mL/min — AB (ref >60)
Glucose, Bld: 115 mg/dL — ABNORMAL HIGH (ref 65–99)
Potassium: 3.6 mmol/L (ref 3.5–5.1)
Sodium: 139 mmol/L (ref 135–145)

## 2017-08-27 ENCOUNTER — Telehealth: Payer: Self-pay | Admitting: *Deleted

## 2017-08-27 NOTE — Telephone Encounter (Signed)
-----   Message from [name], MD sent at 08/27/2017  1:40 PM EDT ----- Labs look ok. Kidney function remains mildly decrased but overall stable. How are bp's doing?  Zandra Abts MD

## 2017-08-27 NOTE — Telephone Encounter (Signed)
Patient informed.  Patient said that he has not been checking her blood pressures at home. Patient advised to start checking her blood pressures at least 3 times per week and contact our office with the readings. Verbalized understanding of plan.

## 2017-10-23 ENCOUNTER — Other Ambulatory Visit: Payer: Self-pay | Admitting: Cardiology

## 2017-10-23 ENCOUNTER — Other Ambulatory Visit (INDEPENDENT_AMBULATORY_CARE_PROVIDER_SITE_OTHER): Payer: Self-pay | Admitting: Internal Medicine

## 2017-11-14 ENCOUNTER — Encounter: Payer: Self-pay | Admitting: Cardiology

## 2017-11-14 ENCOUNTER — Ambulatory Visit: Payer: Medicaid Other | Admitting: Cardiology

## 2017-11-14 VITALS — BP 158/60 | HR 64 | Ht 62.0 in | Wt 168.0 lb

## 2017-11-14 DIAGNOSIS — I10 Essential (primary) hypertension: Secondary | ICD-10-CM

## 2017-11-14 DIAGNOSIS — I2510 Atherosclerotic heart disease of native coronary artery without angina pectoris: Secondary | ICD-10-CM

## 2017-11-14 DIAGNOSIS — E782 Mixed hyperlipidemia: Secondary | ICD-10-CM

## 2017-11-14 DIAGNOSIS — I350 Nonrheumatic aortic (valve) stenosis: Secondary | ICD-10-CM | POA: Diagnosis not present

## 2017-11-14 NOTE — Progress Notes (Signed)
Clinical Summary Christina Boyle is a 62 y.o.female seen today for follow up of the following medical problems.    1. CAD  - prior CABG in 2007 3 vessel (LIMA to LAD, SVG to OM, SVG to PDA) - Echo 12/2012 LVEF 60-65%  - 01/2014 exercise MPI: did not reach THR, exercise limited by SOB and chest pain. Converted to [employer]. Apical anterior and anterolateral ischemia, intermediate risk. LVEF 62% - 06/2015 cath received stent to SVG-OM suboptimal result. If continued symptoms consider PCI ofRCA 06/2015 echo: LVEF 65-70%, no WMAs, grade I diastolic dysfunction, mild to moderate AS  - no recent chest pain, no SOB/DOE - compliant with meds   2. HTN - last visit increased hydralazine to 100mg  tid. She submitted a bp log that was not at goal, was later started on losartan 25mg  daily.   - forgot meds this morning. - does not check bp at home. Has pcp appt later this month.    3. Hyperlipidemia -06/2016 TC 234 TG 152 HDL 47 LDL 157 - 12/2016 TC 216 HDL 52 TG 136 LDL 138 - last visit started zetia 10mg  to be used in combo with her crestor 40 - she reports medication compliance  4.Aortic stenosis 12/2016 echo mild to moderate AS - no symptoms.   5. Carotid stenosis - carotid US 01/2015 with bilateral 1-39% disease. - no neuro symptoms.   6. Hypokalemia - related to diureticsin the past  7.AKI - recent admission with AKI and hypokalemia, lasix was lowered to 20mg  daily.    SH: enjoys spending time outside, planting flowers.    Past Medical History:  Diagnosis Date  . Bleeding ulcer 06/2015  . CAD (coronary artery disease)    Multivessel disease s/p CABG, DES SVG to OM  July 03 2015 (occluded SVG to PDA)  . Cervical cancer ([place])   . Chronic bronchitis   . Coarctation of aorta   . COPD (chronic obstructive pulmonary disease) ([place])   . Depression   . Essential hypertension   . GERD (gastroesophageal reflux disease)   . Heart murmur   .  Hyperlipidemia   . NSTEMI (non-ST elevated myocardial infarction) [facility])    April 2017  . Obesity (BMI 30-39.9)      No Known Allergies   Current Outpatient Medications  Medication Sig Dispense Refill  . acetaminophen (TYLENOL) 500 MG tablet Take 1,000 mg by mouth every 6 (six) hours as needed for moderate pain.    Marland Kitchen albuterol (PROVENTIL HFA;VENTOLIN HFA) 108 (90 Base) MCG/ACT inhaler Inhale 1-2 puffs into the lungs every 6 (six) hours as needed for wheezing or shortness of breath. 1 Inhaler 0  . amLODipine (NORVASC) 10 MG tablet Take 1 tablet (10 mg total) by mouth daily. 90 tablet 3  . aspirin EC 81 MG EC tablet Take 1 tablet (81 mg total) by mouth daily.    . clopidogrel (PLAVIX) 75 MG tablet TAKE 1 TABLET BY MOUTH ONCE DAILY WITH BREAKFAST 90 tablet 3  . docusate sodium (COLACE) 100 MG capsule Take 2 capsules (200 mg total) by mouth every morning. 10 capsule 0  . ezetimibe (ZETIA) 10 MG tablet Take 1 tablet (10 mg total) by mouth daily. 90 tablet 3  . furosemide (LASIX) 20 MG tablet Take 1 tablet (20 mg total) by mouth daily. 90 tablet 3  . hydrALAZINE (APRESOLINE) 100 MG tablet Take 1 tablet (100 mg total) by mouth 3 (three) times daily. 270 tablet 3  . isosorbide mononitrate (IMDUR) 120  MG 24 hr tablet TAKE 1 TABLET BY MOUTH ONCE DAILY 30 tablet 6  . losartan (COZAAR) 25 MG tablet Take 1 tablet (25 mg total) by mouth daily. 90 tablet 3  . metoprolol tartrate (LOPRESSOR) 50 MG tablet Take 75 mg by mouth 2 (two) times daily.    . nitroGLYCERIN (NITROSTAT) 0.4 MG SL tablet Place 1 tablet (0.4 mg total) every 5 (five) minutes as needed under the tongue for chest pain. 25 tablet 3  . pantoprazole (PROTONIX) 40 MG tablet TAKE 1 TABLET BY MOUTH ONCE DAILY BEFORE BREAKFAST 90 tablet 3  . potassium chloride SA (K-DUR,KLOR-CON) 20 MEQ tablet Take 1 tablet (20 mEq total) by mouth daily. 30 tablet 1  . rosuvastatin (CRESTOR) 40 MG tablet Take 1 tablet (40 mg total) daily by mouth. 90 tablet 3  .  Tiotropium Bromide Monohydrate (SPIRIVA RESPIMAT) 2.5 MCG/ACT AERS Inhale 1 puff into the lungs daily as needed (or shortness of breath).      No current facility-administered medications for this visit.      Past Surgical History:  Procedure Laterality Date  . BREAST BIOPSY Left    Benign  . CARDIAC CATHETERIZATION N/A 06/30/2015   Procedure: Left Heart Cath and Cors/Grafts Angiography;  Surgeon: Leonie Man, MD;  Location: [facility];  Service: Cardiovascular;  Laterality: N/A;  . CARDIAC CATHETERIZATION N/A 07/03/2015   Procedure: Coronary Stent Intervention;  Surgeon: Peter M Martinique, MD;  Location: [facility];  Service: Cardiovascular;  Laterality: N/A;  . CERVICAL CONE BIOPSY    . CHOLECYSTECTOMY    . CORONARY ARTERY BYPASS GRAFT  02/2006   LIMA to LAD, SVG to OM, SVG to PDA  . ESOPHAGOGASTRODUODENOSCOPY N/A 07/22/2014   Procedure: ESOPHAGOGASTRODUODENOSCOPY (EGD);  Surgeon: [name], MD;  Location: [facility] END[facility]COPY;  Service: Endoscopy;  Laterality: N/A;  . ESOPHAGOGASTRODUODENOSCOPY N/A 07/12/2015   Procedure: ESOPHAGOGASTRODUODENOSCOPY (EGD);  Surgeon: Rogene Houston, MD;  Location: [facility];  Service: Endoscopy;  Laterality: N/A;  . ESOPHAGOGASTRODUODENOSCOPY N/A 10/18/2015   Procedure: ESOPHAGOGASTRODUODENOSCOPY (EGD);  Surgeon: Rogene Houston, MD;  Location: [facility];  Service: Endoscopy;  Laterality: N/A;  2:55     No Known Allergies    Family History  Problem Relation Age of Onset  . Heart attack Father 67  . Lung cancer Mother 67     Social History Ms. Truxillo reports that she has been smoking cigarettes. She started smoking about 48 years ago. She has a 50.00 pack-year smoking history. She has never used smokeless tobacco. Ms. Wilcoxen reports that she does not drink alcohol.   Review of Systems CONSTITUTIONAL: No weight loss, fever, chills, weakness or fatigue.  HEENT: Eyes: No visual loss, blurred vision, double vision or yellow  sclerae.No hearing loss, sneezing, congestion, runny nose or sore throat.  SKIN: No rash or itching.  CARDIOVASCULAR: per hpi RESPIRATORY: No shortness of breath, cough or sputum.  GASTROINTESTINAL: No anorexia, nausea, vomiting or diarrhea. No abdominal pain or blood.  GENITOURINARY: No burning on urination, no polyuria NEUROLOGICAL: No headache, dizziness, syncope, paralysis, ataxia, numbness or tingling in the extremities. No change in bowel or bladder control.  MUSCULOSKELETAL: No muscle, back pain, joint pain or stiffness.  LYMPHATICS: No enlarged nodes. No history of splenectomy.  PSYCHIATRIC: No history of depression or anxiety.  ENDOCRINOLOGIC: No reports of sweating, cold or heat intolerance. No polyuria or polydipsia.  Marland Kitchen   Physical Examination Vitals:   11/14/17 1102  BP: (!) 158/60  Pulse: 64  SpO2: 98%   Vitals:   11/14/17 1102  Weight: 168 lb (76.2 kg)  Height: 5\' 2"  (1.575 m)    Gen: resting comfortably, no acute distress HEENT: no scleral icterus, pupils equal round and reactive, no palptable cervical adenopathy,  CV: RRR, no m/r/g, no jvd Resp: Clear to auscultation bilaterally GI: abdomen is soft, non-tender, non-distended, normal bowel sounds, no hepatosplenomegaly MSK: extremities are warm, no edema.  Skin: warm, no rash Neuro:  no focal deficits Psych: appropriate affect   Diagnostic Studies 12/2012 Echo LVEF 60-65%, mild to mod LVH, normal diastolic function, mild AS (valve area 1.5 VTI, mean grad 10), RV function mildly reduced   07/2011 Carotid US 1. Bilateral carotid bifurcation and proximal ICA plaque, resulting in less than 50% diameter stenosis. The exam does not exclude plaque ulceration or embolization. Continued surveillance recommended.  02/2006 Cath FINDINGS:  1. LV: 143/17/26. EF 65% without regional wall motion abnormality.  2. No aortic stenosis or mitral regurgitation.  3. Left main: There is an 80% stenosis of the mid  vessel.  4. LAD: Moderate-size vessel giving rise to two small proximal  diagonals and a larger third diagonal. The LAD has heavy  calcification proximally and diffuse mild disease. The third  diagonal has a 70% stenosis proximally. A small medial subdivision  of this has a 99% stenosis.  5. Circumflex: Moderate-sized vessel giving rise to a single  marginal. There is a 70% stenosis in the AV groove portion of the  vessel.  6. RCA: Moderate-size, dominant vessel. There is an 80% ostial  stenosis, a 40% stenosis of the mid vessel, and a 90% stenosis just  before the origin of the PDA.  7. Left subclavian artery: Diffuse 30% stenosis proximally. The LIMA  is widely patent.  8. Aortic coarctation with 30 mmHg translesional gradient.  9. Normal great vessel anatomy. There is mild ostial stenosis of the  left common carotid artery.  IMPRESSION/RECOMMENDATIONS:  1. Severe multivessel coronary disease.  2. Coarctation of aorta.  3. Normal left ventricular systolic function.  Will refer the to cardiac surgery for consideration of coronary artery  bypass grafting and repair of her coarctation.   Jan 2015 PFTs Reason for pulmonary function testing is shortness of breath.  1. Spirometry shows a moderate ventilatory defect with minimal airflow  obstruction.  2. Lung volumes show air trapping.  3. DLCO is normal.  4. Airway resistance is high confirming the presence of airflow  obstruction.  5. No significant bronchodilator improvement.  6. Arterial blood gas shows relative resting hypoxia.  7. This study is consistent with COPD considering the patient's  smoking history.   10/2013 Carotid US IMPRESSION: 1. Slight interval progression of left-sided heterogeneous atherosclerotic plaque which now results in an estimated 50- 69% diameter ICA stenosis. 2. Stable heterogeneous atherosclerotic plaque on the right resulting in less than 50% diameter ICA  stenosis. 3. Vertebral arteries are patent with normal antegrade flow.  01/2014 Lexiscan MPI IMPRESSION: 1. Abnormal study. There is evidence of apical anterior/anterolateral ischemia. Borderline TID ratio increased at 1.2 may also be indicative of subendocardial or balanced ischemia.  2. Normal left ventricular wall motion.  3. Left ventricular ejection fraction 62%  4. Intermediate-risk stress test findings*.   07/2014 echo Study Conclusions  - Left ventricle: The cavity size was normal. Wall thickness was increased in a pattern of mild LVH. Systolic function was normal. The estimated ejection fraction was in the range of 55% to 60%. Wall motion  was normal; there were no regional wall motion abnormalities. Doppler parameters are consistent with elevated ventricular end-diastolic filling pressure. - Aortic valve: Valve area (VTI): 1.36 cm^2. Valve area (Vmax): 1.26 cm^2. Valve area (Vmean): 1.29 cm^2. - Mitral valve: There was mild regurgitation. Valve area by continuity equation (using LVOT flow): 2.15 cm^2. - Left atrium: The atrium was mildly dilated. - Atrial septum: No defect or patent foramen ovale was identified.  06/2015 cath 1. Ost RCA lesion, 95% stenosed. Mid RCA lesion, 50% stenosed. Dist RCA lesion, 70% stenosed. Post Atrio lesion, 80% stenosed. 2. 100% occluded SVG-RPDA at the origin 3. Ost LM to LM lesion, 60% stenosed. 4. Likely ostial left main disease but with brisk antegrade flow in the LAD system. Most notable lesion is the diagonal lesion. 5. 3rd Diag lesion, 80% stenosed. 6. Ost Cx to Prox Cx lesion, 80% stenosed. Ost 1st Mrg to 1st Mrg lesion, 80% stenosed. 7. SVG-OM is widely patent with the exception of a 99% stenosis in the origin 8. LIMA was visualized by non-selective angiography due to inability to cannulate and . Significant disease in that portion of the left subclavian artery.   The patient truly has 2 culprit lesions:  Ostial SVG-OM and the occluded SVG-RCA with now ostial RCA disease. Both lesions are likely be difficult PCI. As the diagnostic procedure was somewhat difficult as far as engaging the LIMA graft, and significant amount of contrast was used with recent renal insufficiency, I felt it best to stop the procedure today with plans for having the patient return for 2 site PCI early next week.  Plan:  Patient will return to nursing unit for continued medical care.  Would start heparin back 6-8 hours following sheath removal.  I have loaded with Plavix 300 mg and ordered 75 mg daily.  She needs aggressive blood pressure management.  06/2015 PCI  Origin lesion, 99% stenosed. Post intervention, there is a 20% residual stenosis.  1. Successful but suboptimal stenting of the ostial SVG to the OM due to difficulty positioning the stent at the origin. As a result the stent was deployed but only the distal portion of the stent was in the SVG.   Plan: continue DAPT indefinitely. Would postpone any further PCI and allow stent to re- endothelialize. If she does have refractory symptoms I would consider her for PCI of the native RCA with rotational atherectomy at a later date. If asymptomatic I would treat this medically.   12/2016 echo Study Conclusions  - Left ventricle: The cavity size was normal. Wall thickness was increased in a pattern of mild LVH. Systolic function was vigorous. The estimated ejection fraction was in the range of 65% to 70%. Wall motion was normal; there were no regional wall motion abnormalities. Features are consistent with a pseudonormal left ventricular filling pattern, with concomitant abnormal relaxation and increased filling pressure (grade 2 diastolic dysfunction). - Aortic valve: Leaflet structure not well defined. Moderately calcified. There was mild to moderate stenosis. Mean gradient (S): 13 mm Hg. Peak gradient (S): 23 mm Hg. VTI ratio of  LVOT to aortic valve: 0.51. Valve area (VTI): 1.46 cm^2. Valve area (Vmax): 1.52 cm^2. Valve area (Vmean): 1.54 cm^2. - Mitral valve: Calcified annulus. There was mild regurgitation. - Left atrium: The atrium was mildly dilated. - Right atrium: Central venous pressure (est): 3 mm Hg. - Atrial septum: No defect or patent foramen ovale was identified. - Tricuspid valve: There was trivial regurgitation. - Pulmonary arteries: Systolic pressure could not be accurately  estimated. - Pericardium, extracardiac: There was no pericardial effusion.  Impressions:  - Mild LVH with LVEF 65-70% and grade 2 diastolic dysfunction. Mild left atrial enlargement. Mild calcified mitral annulus with mild mitral regurgitation. Aortic valve structure is not well-defined, there is moderate calcification and mild to moderate aortic stenosis based on gradients and valve area. Trivial tricuspid regurgitation.     Assessment and Plan   1. CAD  - no symptoms, continue current meds  2. HTN -elevated in clinic, has not taken meds yet today - continue to monitor, she has pcp appt coming up in just a few weeks. Asked to be sure she takes her meds prior to that appt - repeat BMET on losartan.   3. Hyperlipidemia -she strictly endorses compliance with crestor 40 and zetia 10 - repeat lipid panel, if LDL >70 will need to consider pcsk9 inhibitor  4. Aortic stenosis -mild to moderate by last echo -- we will continue to monitor     [name], M.D

## 2017-11-14 NOTE — Addendum Note (Signed)
Addended by: Debbora Lacrosse R on: 11/14/2017 11:45 AM   Modules accepted: Orders

## 2017-11-14 NOTE — Patient Instructions (Signed)
Medication Instructions:  Your physician recommends that you continue on your current medications as directed. Please refer to the Current Medication list given to you today.   Labwork: asap    Testing/Procedures: none  Follow-Up: Your physician wants you to follow-up in: 6 months  You will receive a reminder letter in the mail two months in advance. If you don't receive a letter, please call our office to schedule the follow-up appointment.\  Any Other Special Instructions Will Be Listed Below (If Applicable).     If you need a refill on your cardiac medications before your next appointment, please call your pharmacy.

## 2017-11-18 LAB — LIPID PANEL
CHOLESTEROL: 134 mg/dL (ref <200)
HDL: 48 mg/dL — ABNORMAL LOW (ref >50)
LDL Cholesterol (Calc): 69 mg/dL (calc)
Non-HDL Cholesterol (Calc): 86 mg/dL (calc) (ref <130)
TRIGLYCERIDES: 89 mg/dL (ref <150)
Total CHOL/HDL Ratio: 2.8 (calc) (ref <5.0)

## 2017-11-18 LAB — BASIC METABOLIC PANEL
BUN/Creatinine Ratio: 11 (calc) (ref 6–22)
BUN: 13 mg/dL (ref 7–25)
CALCIUM: 9.4 mg/dL (ref 8.6–10.4)
CO2: 23 mmol/L (ref 20–32)
Chloride: 105 mmol/L (ref 98–110)
Creat: 1.18 mg/dL — ABNORMAL HIGH (ref 0.50–0.99)
GLUCOSE: 116 mg/dL — AB (ref 65–99)
Potassium: 3.5 mmol/L (ref 3.5–5.3)
Sodium: 137 mmol/L (ref 135–146)

## 2017-11-19 ENCOUNTER — Telehealth: Payer: Self-pay

## 2017-11-19 NOTE — Telephone Encounter (Signed)
-----   Message from [name], MD sent at 11/19/2017 11:42 AM EDT ----- Labs look good, cholesterol is much improved and now is at goal, no med changes   Zandra Abts MD

## 2017-11-25 ENCOUNTER — Telehealth: Payer: Self-pay | Admitting: Cardiology

## 2017-11-25 NOTE — Telephone Encounter (Signed)
Calling for lab results. °

## 2017-11-25 NOTE — Telephone Encounter (Signed)
PTs new number [phone]   Results given to her

## 2017-12-25 ENCOUNTER — Other Ambulatory Visit: Payer: Self-pay | Admitting: Cardiology

## 2018-01-09 ENCOUNTER — Other Ambulatory Visit: Payer: Self-pay | Admitting: Cardiology

## 2018-03-12 ENCOUNTER — Ambulatory Visit (INDEPENDENT_AMBULATORY_CARE_PROVIDER_SITE_OTHER): Payer: Medicaid Other | Admitting: Internal Medicine

## 2018-04-13 ENCOUNTER — Other Ambulatory Visit: Payer: Self-pay | Admitting: Pulmonary Disease

## 2018-04-13 ENCOUNTER — Other Ambulatory Visit (HOSPITAL_COMMUNITY): Payer: Self-pay | Admitting: Pulmonary Disease

## 2018-04-13 DIAGNOSIS — M79604 Pain in right leg: Secondary | ICD-10-CM

## 2018-04-14 LAB — LIPID PANEL
Cholesterol: 148 (ref 0–200)
HDL: 53 (ref 35–70)
LDL Cholesterol: 78
Triglycerides: 85 (ref 40–160)

## 2018-04-14 LAB — TSH: TSH: 2.23 (ref <=5.90)

## 2018-04-15 ENCOUNTER — Ambulatory Visit (HOSPITAL_COMMUNITY)
Admission: RE | Admit: 2018-04-15 | Discharge: 2018-04-15 | Disposition: A | Discharge status: Home / Self Care | Payer: Medicaid Other | Source: Ambulatory Visit | Attending: Pulmonary Disease | Admitting: Pulmonary Disease

## 2018-04-15 ENCOUNTER — Encounter: Payer: Self-pay | Admitting: Pulmonary Disease

## 2018-04-15 DIAGNOSIS — M79604 Pain in right leg: Secondary | ICD-10-CM | POA: Diagnosis not present

## 2018-04-27 ENCOUNTER — Telehealth: Payer: Self-pay | Admitting: Cardiology

## 2018-04-27 ENCOUNTER — Other Ambulatory Visit: Payer: Self-pay

## 2018-04-27 MED ORDER — ISOSORBIDE MONONITRATE ER 120 MG PO TB24: 120.0000 mg | ORAL_TABLET | Freq: Every day | ORAL | 3 refills | Status: DC

## 2018-04-27 NOTE — Telephone Encounter (Signed)
Patient notes pain in right leg only.She had US done by Dr.[name]  on 04/15/18 and was sent a letter, negative for DVT.Patient says swelling around ankle has gone down but has some redness and leg still hurts. Her weight is unchanged at 168 lbs, states these sx's have been present for a month

## 2018-04-27 NOTE — Telephone Encounter (Signed)
Patient states  She will see Dr.[name] again, she has talked to his nurse already

## 2018-04-27 NOTE — Telephone Encounter (Signed)
Refilled imdur 120 mg qd to [facility]

## 2018-04-27 NOTE — Telephone Encounter (Signed)
Probably best to follow up with Dr Luan Pulling. The type of swelling I would deal with would be related to the heart, that type of swelling would typically be in both legs and would not be painful or red/inflamed. From the description does not sound heart related   Zandra Abts MD

## 2018-04-27 NOTE — Telephone Encounter (Signed)
Per pt phone call-- having swelling in legs, requesting apt w/ JB

## 2018-05-13 ENCOUNTER — Other Ambulatory Visit (HOSPITAL_COMMUNITY): Payer: Self-pay | Admitting: Pulmonary Disease

## 2018-05-13 ENCOUNTER — Other Ambulatory Visit: Payer: Self-pay | Admitting: Pulmonary Disease

## 2018-05-13 DIAGNOSIS — R2241 Localized swelling, mass and lump, right lower limb: Secondary | ICD-10-CM

## 2018-05-13 DIAGNOSIS — M79604 Pain in right leg: Secondary | ICD-10-CM

## 2018-05-14 ENCOUNTER — Ambulatory Visit (HOSPITAL_COMMUNITY): Admission: RE | Admit: 2018-05-14 | Payer: Medicare Other | Source: Ambulatory Visit

## 2018-05-14 ENCOUNTER — Encounter (HOSPITAL_COMMUNITY): Payer: Self-pay

## 2018-06-01 DIAGNOSIS — I2510 Atherosclerotic heart disease of native coronary artery without angina pectoris: Secondary | ICD-10-CM | POA: Diagnosis not present

## 2018-06-01 DIAGNOSIS — I10 Essential (primary) hypertension: Secondary | ICD-10-CM | POA: Diagnosis not present

## 2018-06-01 DIAGNOSIS — I5032 Chronic diastolic (congestive) heart failure: Secondary | ICD-10-CM | POA: Diagnosis not present

## 2018-06-01 DIAGNOSIS — J441 Chronic obstructive pulmonary disease with (acute) exacerbation: Secondary | ICD-10-CM | POA: Diagnosis not present

## 2018-06-11 ENCOUNTER — Ambulatory Visit: Payer: Medicaid Other | Admitting: Cardiology

## 2018-06-17 ENCOUNTER — Other Ambulatory Visit: Payer: Self-pay

## 2018-06-17 ENCOUNTER — Emergency Department (HOSPITAL_COMMUNITY)
Admission: EM | Admit: 2018-06-17 | Discharge: 2018-06-17 | Disposition: A | Discharge status: Home / Self Care | Payer: Medicare Other | Attending: Emergency Medicine | Admitting: Emergency Medicine

## 2018-06-17 ENCOUNTER — Encounter (HOSPITAL_COMMUNITY): Payer: Self-pay

## 2018-06-17 DIAGNOSIS — J449 Chronic obstructive pulmonary disease, unspecified: Secondary | ICD-10-CM | POA: Insufficient documentation

## 2018-06-17 DIAGNOSIS — R531 Weakness: Secondary | ICD-10-CM | POA: Diagnosis not present

## 2018-06-17 DIAGNOSIS — F17210 Nicotine dependence, cigarettes, uncomplicated: Secondary | ICD-10-CM | POA: Diagnosis not present

## 2018-06-17 DIAGNOSIS — Z79899 Other long term (current) drug therapy: Secondary | ICD-10-CM | POA: Insufficient documentation

## 2018-06-17 DIAGNOSIS — I129 Hypertensive chronic kidney disease with stage 1 through stage 4 chronic kidney disease, or unspecified chronic kidney disease: Secondary | ICD-10-CM | POA: Insufficient documentation

## 2018-06-17 DIAGNOSIS — Z7982 Long term (current) use of aspirin: Secondary | ICD-10-CM | POA: Insufficient documentation

## 2018-06-17 DIAGNOSIS — R7989 Other specified abnormal findings of blood chemistry: Secondary | ICD-10-CM | POA: Diagnosis not present

## 2018-06-17 DIAGNOSIS — I959 Hypotension, unspecified: Secondary | ICD-10-CM | POA: Diagnosis not present

## 2018-06-17 DIAGNOSIS — E876 Hypokalemia: Secondary | ICD-10-CM

## 2018-06-17 DIAGNOSIS — R42 Dizziness and giddiness: Secondary | ICD-10-CM | POA: Diagnosis not present

## 2018-06-17 DIAGNOSIS — Z7902 Long term (current) use of antithrombotics/antiplatelets: Secondary | ICD-10-CM | POA: Insufficient documentation

## 2018-06-17 DIAGNOSIS — R0902 Hypoxemia: Secondary | ICD-10-CM | POA: Diagnosis not present

## 2018-06-17 DIAGNOSIS — I2510 Atherosclerotic heart disease of native coronary artery without angina pectoris: Secondary | ICD-10-CM | POA: Insufficient documentation

## 2018-06-17 DIAGNOSIS — N183 Chronic kidney disease, stage 3 (moderate): Secondary | ICD-10-CM | POA: Diagnosis not present

## 2018-06-17 HISTORY — DX: Hypokalemia: E87.6

## 2018-06-17 LAB — BASIC METABOLIC PANEL
Anion gap: 11 (ref 5–15)
BUN: 19 mg/dL (ref 8–23)
CO2: 22 mmol/L (ref 22–32)
Calcium: 9.2 mg/dL (ref 8.9–10.3)
Chloride: 102 mmol/L (ref 98–111)
Creatinine, Ser: 1.44 mg/dL — ABNORMAL HIGH (ref 0.44–1.00)
GFR calc Af Amer: 44 mL/min — ABNORMAL LOW (ref >60)
GFR calc non Af Amer: 38 mL/min — ABNORMAL LOW (ref >60)
Glucose, Bld: 135 mg/dL — ABNORMAL HIGH (ref 70–99)
Potassium: 3.1 mmol/L — ABNORMAL LOW (ref 3.5–5.1)
Sodium: 135 mmol/L (ref 135–145)

## 2018-06-17 LAB — CBC
HCT: 43.3 % (ref 36.0–46.0)
Hemoglobin: 12.9 g/dL (ref 12.0–15.0)
MCH: 24.6 pg — ABNORMAL LOW (ref 26.0–34.0)
MCHC: 29.8 g/dL — ABNORMAL LOW (ref 30.0–36.0)
MCV: 82.6 fL (ref 80.0–100.0)
Platelets: 192 10*3/uL (ref 150–400)
RBC: 5.24 MIL/uL — ABNORMAL HIGH (ref 3.87–5.11)
RDW: 18.3 % — ABNORMAL HIGH (ref 11.5–15.5)
WBC: 10.8 10*3/uL — ABNORMAL HIGH (ref 4.0–10.5)
nRBC: 0 % (ref 0.0–0.2)

## 2018-06-17 LAB — I-STAT TROPONIN, ED: Troponin i, poc: 0.02 ng/mL (ref 0.00–0.08)

## 2018-06-17 MED ORDER — POTASSIUM CHLORIDE CRYS ER 20 MEQ PO TBCR: 20.0000 meq | EXTENDED_RELEASE_TABLET | Freq: Every day | ORAL | 0 refills | Status: DC

## 2018-06-17 MED ORDER — IPRATROPIUM BROMIDE HFA 17 MCG/ACT IN AERS
2.0000 | INHALATION_SPRAY | Freq: Once | RESPIRATORY_TRACT | Status: AC
  Administered 2018-06-17: 2 via RESPIRATORY_TRACT
  Filled 2018-06-17: qty 12.9

## 2018-06-17 MED ORDER — ALBUTEROL SULFATE HFA 108 (90 BASE) MCG/ACT IN AERS
2.0000 | INHALATION_SPRAY | Freq: Once | RESPIRATORY_TRACT | Status: AC
  Administered 2018-06-17: 2 via RESPIRATORY_TRACT
  Filled 2018-06-17: qty 6.7

## 2018-06-17 MED ORDER — POTASSIUM CHLORIDE CRYS ER 20 MEQ PO TBCR
40.0000 meq | EXTENDED_RELEASE_TABLET | Freq: Once | ORAL | Status: AC
  Administered 2018-06-17: 40 meq via ORAL
  Filled 2018-06-17: qty 2

## 2018-06-17 NOTE — ED Notes (Signed)
RT notified

## 2018-06-17 NOTE — ED Provider Notes (Signed)
[facility] EMERGENCY DEPARTMENT Provider Note   CSN: [id number] Arrival date & time: 06/17/18  1650    History   Chief Complaint Chief Complaint  Patient presents with   Abnormal Lab    HPI Christina Boyle is a 63 y.o. female.     Patient c/o feeling generally weak for past 3 days if her potassium level may be low. Symptoms gradual onset, moderate, persistent, without abrupt change or worsening today. States a week or two ago had 'bronchitis' - states is feeling improved from that, cough improved. Denies fever/chills. No sore throat. Denies chest pain or discomfort. No sob or increased doe. Hx copd, uses mdi prn. +smoker. Notes normal appetite. No nvd. No abd pain. No dysuria or gu c/o.  The history is provided by the patient.  Abnormal Lab    Past Medical History:  Diagnosis Date   Bleeding ulcer 06/2015   CAD (coronary artery disease)    Multivessel disease s/p CABG, DES SVG to OM  July 03 2015 (occluded SVG to PDA)   Cervical cancer [facility])    Chronic bronchitis    Coarctation of aorta    COPD (chronic obstructive pulmonary disease) (HCC)    Depression    Essential hypertension    GERD (gastroesophageal reflux disease)    Heart murmur    Hyperlipidemia    Hypokalemia    NSTEMI (non-ST elevated myocardial infarction) [facility])    April 2017   Obesity (BMI 30-39.9)     Patient Active Problem List   Diagnosis Date Noted   GERD (gastroesophageal reflux disease) 05/16/2017   Dizziness 05/16/2017   Hypokalemia 10/25/2015   CAD (coronary artery disease) 10/25/2015   H. pylori infection 10/25/2015   CKD (chronic kidney disease) stage 3, GFR 30-59 ml/min ([place]) 07/14/2015   Thrombocytopenia ([place]) 07/12/2015   Hypovolemia 07/12/2015   AKI (acute kidney injury) ([place]) 07/11/2015   Acute upper GI bleed 07/11/2015   Obesity (BMI 30-39.9) 06/30/2015   Hx of CABG 2007 06/30/2015   Hypertensive cardiovascular disease 06/30/2015   Aortic  stenosis-mild to moderate 06/30/2015   Coronary artery disease involving native heart with unstable angina pectoris ([place]) 06/30/2015   NSTEMI (non-ST elevated myocardial infarction) ([place]) 06/29/2015   COPD exacerbation ([place]) 06/27/2015   Acute gastric ulcer with hemorrhage    Elevated troponin    Upper gastrointestinal bleed 07/21/2014   Symptomatic anemia 07/21/2014   Chest pain 07/21/2014   Acute renal failure ([place]) 07/21/2014   Azotemia 07/21/2014   Abnormal urinalysis 07/21/2014   COPD (chronic obstructive pulmonary disease) ([place]) 07/21/2014   Acute blood loss anemia 07/21/2014   Hypertension 07/31/2011   Carotid bruit 07/31/2011   CERVICAL CANCER 04/04/2009   Hyperlipemia 04/04/2009   TOBACCO ABUSE 04/04/2009   COARCTATION OF AORTA 04/04/2009   FATIGUE 04/04/2009    Past Surgical History:  Procedure Laterality Date   BREAST BIOPSY Left    Benign   CARDIAC CATHETERIZATION N/A 06/30/2015   Procedure: Left Heart Cath and Cors/Grafts Angiography;  Surgeon: Leonie Man, MD;  Location: [facility];  Service: Cardiovascular;  Laterality: N/A;   CARDIAC CATHETERIZATION N/A 07/03/2015   Procedure: Coronary Stent Intervention;  Surgeon: Peter M Martinique, MD;  Location: [facility];  Service: Cardiovascular;  Laterality: N/A;   CERVICAL CONE BIOPSY     CHOLECYSTECTOMY     CORONARY ARTERY BYPASS GRAFT  02/2006   LIMA to LAD, SVG to OM, SVG to PDA   ESOPHAGOGASTRODUODENOSCOPY N/A 07/22/2014  Procedure: ESOPHAGOGASTRODUODENOSCOPY (EGD);  Surgeon: [name], MD;  Location[facility] ENDOSCOPY;  Service: Endoscopy;  Laterality: N/A;   ESOPHAGOGASTRODUODENOSCOPY N/A 07/12/2015   Procedure: ESOPHAGOGASTRODUODENOSCOPY (EGD);  Surgeon: Rogene Houston, MD;  Location: [facility] ENDO SUITE;  Service: Endoscopy;  Laterality: N/A;   ESOPHAGOGASTRODUODENOSCOPY N/A 10/18/2015   Procedure: ESOPHAGOGASTRODUODENOSCOPY (EGD);  Surgeon: Rogene Houston, MD;  Location: [facility]  ENDO SUITE;  Service: Endoscopy;  Laterality: N/A;  2:55     OB History    Gravida  5   Para  5   Term  5   Preterm      AB      Living        SAB      TAB      Ectopic      Multiple      Live Births               Home Medications    Prior to Admission medications   Medication Sig Start Date End Date Taking? Authorizing Provider  acetaminophen (TYLENOL) 500 MG tablet Take 1,000 mg by mouth every 6 (six) hours as needed for moderate pain.    [provider]  albuterol (PROVENTIL HFA;VENTOLIN HFA) 108 (90 Base) MCG/ACT inhaler Inhale 1-2 puffs into the lungs every 6 (six) hours as needed for wheezing or shortness of breath. 05/27/16   Long, Wonda Olds, MD  amLODipine (NORVASC) 10 MG tablet TAKE 1 TABLET BY MOUTH DAILY 12/25/17   [name], MD  aspirin EC 81 MG EC tablet Take 1 tablet (81 mg total) by mouth daily. 07/23/14   [name], MD  clopidogrel (PLAVIX) 75 MG tablet TAKE 1 TABLET BY MOUTH ONCE DAILY WITH BREAKFAST 07/08/17   [name], MD  docusate sodium (COLACE) 100 MG capsule Take 2 capsules (200 mg total) by mouth every morning. 05/19/17   Lucia Gaskins, MD  ezetimibe (ZETIA) 10 MG tablet Take 1 tablet (10 mg total) by mouth daily. 07/11/17 11/14/17  [name], MD  furosemide (LASIX) 20 MG tablet Take 1 tablet (20 mg total) by mouth daily. 07/11/17 11/14/17  [name], MD  hydrALAZINE (APRESOLINE) 100 MG tablet Take 1 tablet (100 mg total) by mouth 3 (three) times daily. 07/11/17   [name], MD  isosorbide mononitrate (IMDUR) 120 MG 24 hr tablet Take 1 tablet (120 mg total) by mouth daily. 04/27/18   [name], MD  losartan (COZAAR) 25 MG tablet Take 1 tablet (25 mg total) by mouth daily. 08/11/17 11/14/17  [name], MD  metoprolol tartrate (LOPRESSOR) 50 MG tablet Take 75 mg by mouth 2 (two) times daily.    [provider]  nitroGLYCERIN (NITROSTAT) 0.4 MG SL tablet Place 1 tablet (0.4  mg total) every 5 (five) minutes as needed under the tongue for chest pain. 01/13/17   [name], MD  pantoprazole (PROTONIX) 40 MG tablet TAKE 1 TABLET BY MOUTH ONCE DAILY BEFORE BREAKFAST 10/23/17   Setzer, Rona Ravens, NP  rosuvastatin (CRESTOR) 40 MG tablet TAKE 1 TABLET BY MOUTH ONCE DAILY 01/09/18   [name], MD  Tiotropium Bromide Monohydrate (SPIRIVA RESPIMAT) 2.5 MCG/ACT AERS Inhale 1 puff into the lungs daily as needed (or shortness of breath).     [provider]    Family History Family History  Problem Relation Age of Onset   Heart attack Father 52   Lung cancer Mother 31    Social History  Social History   Tobacco Use   Smoking status: Current Every Day Smoker    Packs/day: 1.00    Years: 50.00    Pack years: 50.00    Types: Cigarettes    Start date: 05/21/1969   Smokeless tobacco: Never Used  Substance Use Topics   Alcohol use: No    Alcohol/week: 0.0 standard drinks   Drug use: No     Allergies   Patient has no known allergies.   Review of Systems Review of Systems  Constitutional: Negative for chills and fever.  HENT: Negative for sore throat.   Eyes: Negative for redness.  Respiratory: Negative for shortness of breath.        Recent cough, improved.   Cardiovascular: Negative for chest pain, palpitations and leg swelling.  Gastrointestinal: Negative for abdominal pain, diarrhea and vomiting.  Endocrine: Negative for polyuria.  Genitourinary: Negative for dysuria and flank pain.  Musculoskeletal: Negative for back pain and neck pain.  Skin: Negative for rash.  Neurological: Negative for numbness and headaches.  Hematological: Does not bruise/bleed easily.  Psychiatric/Behavioral: Negative for confusion.     Physical Exam Updated Vital Signs BP (!) 121/48 (BP Location: Right Arm)    Pulse (!) 55    Temp 97.7 F (36.5 C) (Oral)    Resp 16    Ht 1.575 m (5\' 2" )    Wt 75.3 kg    SpO2 91%    BMI 30.36 kg/m   Physical  Exam Vitals signs and nursing note reviewed.  Constitutional:      Appearance: Normal appearance. She is well-developed.  HENT:     Head: Atraumatic.     Nose: Nose normal.     Mouth/Throat:     Mouth: Mucous membranes are moist.  Eyes:     General: No scleral icterus.    Conjunctiva/sclera: Conjunctivae normal.  Neck:     Musculoskeletal: Normal range of motion and neck supple. No neck rigidity or muscular tenderness.     Trachea: No tracheal deviation.  Cardiovascular:     Rate and Rhythm: Normal rate and regular rhythm.     Pulses: Normal pulses.     Heart sounds: Normal heart sounds. No murmur. No friction rub. No gallop.   Pulmonary:     Effort: Pulmonary effort is normal. No respiratory distress.     Breath sounds: Wheezing present.  Abdominal:     General: Bowel sounds are normal. There is no distension.     Palpations: Abdomen is soft.     Tenderness: There is no abdominal tenderness. There is no guarding.  Genitourinary:    Comments: No cva tenderness.  Musculoskeletal:        General: No swelling.     Right lower leg: No edema.     Left lower leg: No edema.  Skin:    General: Skin is warm and dry.     Findings: No rash.  Neurological:     Mental Status: She is alert.     Comments: Alert, speech normal. Steady gait.  Psychiatric:        Mood and Affect: Mood normal.      ED Treatments / Results  Labs (all labs ordered are listed, but only abnormal results are displayed) Results for orders placed or performed during the hospital encounter of 16/60/63  Basic metabolic panel  Result Value Ref Range   Sodium 135 135 - 145 mmol/L   Potassium 3.1 (L) 3.5 - 5.1 mmol/L   Chloride 102  98 - 111 mmol/L   CO2 22 22 - 32 mmol/L   Glucose, Bld 135 (H) 70 - 99 mg/dL   BUN 19 8 - 23 mg/dL   Creatinine, Ser 1.44 (H) 0.44 - 1.00 mg/dL   Calcium 9.2 8.9 - 10.3 mg/dL   GFR calc non Af Amer 38 (L) >60 mL/min   GFR calc Af Amer 44 (L) >60 mL/min   Anion gap 11 5 - 15    CBC  Result Value Ref Range   WBC 10.8 (H) 4.0 - 10.5 K/uL   RBC 5.24 (H) 3.87 - 5.11 MIL/uL   Hemoglobin 12.9 12.0 - 15.0 g/dL   HCT 43.3 36.0 - 46.0 %   MCV 82.6 80.0 - 100.0 fL   MCH 24.6 (L) 26.0 - 34.0 pg   MCHC 29.8 (L) 30.0 - 36.0 g/dL   RDW 18.3 (H) 11.5 - 15.5 %   Platelets 192 150 - 400 K/uL   nRBC 0.0 0.0 - 0.2 %  I-stat troponin, ED  Result Value Ref Range   Troponin i, poc 0.02 0.00 - 0.08 ng/mL   Comment 3            EKG EKG Interpretation  Date/Time:  Wednesday June 17 2018 17:02:28 EDT Ventricular Rate:  57 PR Interval:    QRS Duration: 91 QT Interval:  407 QTC Calculation: 397 R Axis:   67 Text Interpretation:  Sinus rhythm Prolonged PR interval Non-specific ST-t changes No significant change since last tracing Confirmed by Lajean Saver [phone]) on 06/17/2018 5:08:55 PM   Radiology No results found.  Procedures Procedures (including critical care time)  Medications Ordered in ED Medications  albuterol (PROVENTIL HFA;VENTOLIN HFA) 108 (90 Base) MCG/ACT inhaler 2 puff (has no administration in time range)  ipratropium (ATROVENT HFA) inhaler 2 puff (has no administration in time range)     Initial Impression / Assessment and Plan / ED Course  I have reviewed the triage vital signs and the nursing notes.  Pertinent labs & imaging results that were available during my care of the patient were reviewed by me and considered in my medical decision making (see chart for details).  Labs sent.   Reviewed nursing notes and prior charts for additional history.   Labs reviewed by me - k low 3.1. creatinine 1.44  kcl po. Po fluids. Will have hold lasix for next couple days. rec pcp f/u.   Return precautions provided.   Pt currently appears stable for d/c.     Final Clinical Impressions(s) / ED Diagnoses   Final diagnoses:  None    ED Discharge Orders    None       Lajean Saver, MD 06/17/18 1840

## 2018-06-17 NOTE — ED Triage Notes (Signed)
Pt reports that for the past 3 days she has been weak and tired and feels like her potassium is low. States she is on lasix and does not take potassium supplement. Denies pain

## 2018-06-17 NOTE — Discharge Instructions (Addendum)
It was our pleasure to provide your ER care today - we hope that you feel better.  From today's lab tests, your potassium is mildly low (3.1) - eat plenty of fruits and vegetables, take potassium supplement as prescribed, and follow up with your doctor in 1-2 weeks.   Also, your kidney function tests is mildly elevated (creatinine 1.44) - drink adequate fluids, do not take your lasix for the next 2 days, and contact your doctor about possibly adjusting your medication.   Return to ER if worse, new symptoms, increased trouble breathing, fevers, other concern.

## 2018-06-29 ENCOUNTER — Other Ambulatory Visit: Payer: Self-pay | Admitting: Cardiology

## 2018-07-07 ENCOUNTER — Other Ambulatory Visit: Payer: Self-pay | Admitting: Cardiology

## 2018-07-13 ENCOUNTER — Other Ambulatory Visit: Payer: Self-pay | Admitting: Cardiology

## 2018-07-13 MED ORDER — LOSARTAN POTASSIUM 25 MG PO TABS: 25.0000 mg | ORAL_TABLET | Freq: Every day | ORAL | 3 refills | Status: DC

## 2018-07-13 NOTE — Telephone Encounter (Signed)
Refilled cozaar per request

## 2018-07-13 NOTE — Telephone Encounter (Signed)
° °  1. Which medications need to be refilled? (please list name of each medication and dose if known)  COZAAR 25 MG   2. Which pharmacy/location (including street and city if local pharmacy) is medication to be sent to? [facility] [place], [address]   3. Do they need a 30 day or 90 day supply? [id number]  Pharmacy is requesting a new RX for 90 days to increase adherence.

## 2018-07-29 ENCOUNTER — Other Ambulatory Visit: Payer: Self-pay | Admitting: Cardiology

## 2018-08-12 DIAGNOSIS — I2510 Atherosclerotic heart disease of native coronary artery without angina pectoris: Secondary | ICD-10-CM | POA: Diagnosis not present

## 2018-08-12 DIAGNOSIS — J301 Allergic rhinitis due to pollen: Secondary | ICD-10-CM | POA: Diagnosis not present

## 2018-08-12 DIAGNOSIS — I10 Essential (primary) hypertension: Secondary | ICD-10-CM | POA: Diagnosis not present

## 2018-08-12 DIAGNOSIS — J449 Chronic obstructive pulmonary disease, unspecified: Secondary | ICD-10-CM | POA: Diagnosis not present

## 2018-09-03 ENCOUNTER — Encounter: Payer: Self-pay | Admitting: Cardiology

## 2018-09-03 ENCOUNTER — Other Ambulatory Visit: Payer: Self-pay

## 2018-09-03 ENCOUNTER — Ambulatory Visit (INDEPENDENT_AMBULATORY_CARE_PROVIDER_SITE_OTHER): Payer: Medicare Other | Admitting: Cardiology

## 2018-09-03 VITALS — BP 142/58 | HR 61 | Temp 98.9°F | Ht 62.0 in | Wt 166.0 lb

## 2018-09-03 DIAGNOSIS — I10 Essential (primary) hypertension: Secondary | ICD-10-CM

## 2018-09-03 DIAGNOSIS — I350 Nonrheumatic aortic (valve) stenosis: Secondary | ICD-10-CM

## 2018-09-03 DIAGNOSIS — R600 Localized edema: Secondary | ICD-10-CM | POA: Diagnosis not present

## 2018-09-03 DIAGNOSIS — E782 Mixed hyperlipidemia: Secondary | ICD-10-CM | POA: Diagnosis not present

## 2018-09-03 DIAGNOSIS — I2510 Atherosclerotic heart disease of native coronary artery without angina pectoris: Secondary | ICD-10-CM

## 2018-09-03 MED ORDER — FUROSEMIDE 20 MG PO TABS: 20.0000 mg | ORAL_TABLET | Freq: Every day | ORAL | 6 refills | Status: DC | PRN

## 2018-09-03 NOTE — Progress Notes (Signed)
Clinical Summary Christina Boyle is a 63 y.o.female  seen today for follow up of the following medical problems.    1. CAD  - prior CABG in 2007 3 vessel (LIMA to LAD, SVG to OM, SVG to PDA) - Echo 12/2012 LVEF 60-65%  - 01/2014 exercise MPI: did not reach THR, exercise limited by SOB and chest pain. Converted to [employer]. Apical anterior and anterolateral ischemia, intermediate risk. LVEF 62% - 06/2015 cath received stent to SVG-OM suboptimal result. If continued symptoms consider PCI ofRCA 06/2015 echo: LVEF 65-70%, no WMAs, grade I diastolic dysfunction, mild to moderate AS   - no recent chest pain. No SOb or DOE - compliant withmeds. Plans for indefinitive DAPT.    2. HTN - last visit increased hydralazine to 100mg  tid. She submitted a bp log that was not at goal, was later started on losartan 25mg  daily.    - compliant with meds   3. Hyperlipidemia 11/2017 TC 134 HDL 48 TG 89 LDL 69 - compliant with crestor and zetia   4.Aortic stenosis 12/2016 echo mild to moderateAS - denies any symptoms   5. Carotid stenosis - carotid US 01/2015 with bilateral 1-39% disease.  6. Hypokalemia - related to diureticsin the past - recurrent issue during 06/2018 ER visit. K 3.1 Cr 1.44  7. LE edema - mild and stable, taking lasix 20mg  daily   SH: enjoys spending time outside, planting flowers.    Past Medical History:  Diagnosis Date  . Bleeding ulcer 06/2015  . CAD (coronary artery disease)    Multivessel disease s/p CABG, DES SVG to OM  July 03 2015 (occluded SVG to PDA)  . Cervical cancer ([name])   . Chronic bronchitis   . Coarctation of aorta   . COPD (chronic obstructive pulmonary disease) ([place])   . Depression   . Essential hypertension   . GERD (gastroesophageal reflux disease)   . Heart murmur   . Hyperlipidemia   . Hypokalemia   . NSTEMI (non-ST elevated myocardial infarction) [facility])    April 2017  . Obesity (BMI 30-39.9)      No Known  Allergies   Current Outpatient Medications  Medication Sig Dispense Refill  . albuterol (PROVENTIL HFA;VENTOLIN HFA) 108 (90 Base) MCG/ACT inhaler Inhale 1-2 puffs into the lungs every 6 (six) hours as needed for wheezing or shortness of breath. 1 Inhaler 0  . amLODipine (NORVASC) 10 MG tablet TAKE 1 TABLET BY MOUTH DAILY (Patient taking differently: Take 10 mg by mouth daily. ) 90 tablet 3  . aspirin EC 81 MG EC tablet Take 1 tablet (81 mg total) by mouth daily.    . clopidogrel (PLAVIX) 75 MG tablet Take 1 tablet by mouth once daily with breakfast 90 tablet 0  . doxycycline (VIBRA-TABS) 100 MG tablet Take 100 mg by mouth 2 (two) times daily.    Marland Kitchen ezetimibe (ZETIA) 10 MG tablet Take 1 tablet by mouth once daily 90 tablet 3  . furosemide (LASIX) 20 MG tablet Take 1 tablet by mouth once daily 30 tablet 6  . hydrALAZINE (APRESOLINE) 100 MG tablet Take 1 tablet (100 mg total) by mouth 3 (three) times daily. 270 tablet 3  . isosorbide mononitrate (IMDUR) 120 MG 24 hr tablet Take 1 tablet (120 mg total) by mouth daily. 90 tablet 3  . losartan (COZAAR) 25 MG tablet Take 1 tablet (25 mg total) by mouth daily. 90 tablet 3  . metoprolol tartrate (LOPRESSOR) 50 MG tablet Take 75 mg  by mouth 2 (two) times daily.    . nitroGLYCERIN (NITROSTAT) 0.4 MG SL tablet Place 1 tablet (0.4 mg total) every 5 (five) minutes as needed under the tongue for chest pain. 25 tablet 3  . pantoprazole (PROTONIX) 40 MG tablet TAKE 1 TABLET BY MOUTH ONCE DAILY BEFORE BREAKFAST (Patient taking differently: Take 40 mg by mouth every morning. ) 90 tablet 3  . potassium chloride SA (K-DUR,KLOR-CON) 20 MEQ tablet Take 1 tablet (20 mEq total) by mouth daily. 15 tablet 0  . rosuvastatin (CRESTOR) 40 MG tablet TAKE 1 TABLET BY MOUTH ONCE DAILY (Patient taking differently: Take 40 mg by mouth daily. ) 90 tablet 3   No current facility-administered medications for this visit.      Past Surgical History:  Procedure Laterality Date  .  BREAST BIOPSY Left    Benign  . CARDIAC CATHETERIZATION N/A 06/30/2015   Procedure: Left Heart Cath and Cors/Grafts Angiography;  Surgeon: Leonie Man, MD;  Location: [place] [facility];  Service: Cardiovascular;  Laterality: N/A;  . CARDIAC CATHETERIZATION N/A 07/03/2015   Procedure: Coronary Stent Intervention;  Surgeon: Peter M Martinique, MD;  Location: [place] [facility];  Service: Cardiovascular;  Laterality: N/A;  . CERVICAL CONE BIOPSY    . CHOLECYSTECTOMY    . CORONARY ARTERY BYPASS GRAFT  02/2006   LIMA to LAD, SVG to OM, SVG to PDA  . ESOPHAGOGASTRODUODENOSCOPY N/A 07/22/2014   Procedure: ESOPHAGOGASTRODUODENOSCOPY (EGD);  Surgeon: [name], MD;  Location: [facility] ENDOSCOPY;  Service: Endoscopy;  Laterality: N/A;  . ESOPHAGOGASTRODUODENOSCOPY N/A 07/12/2015   Procedure: ESOPHAGOGASTRODUODENOSCOPY (EGD);  Surgeon: Rogene Houston, MD;  [facility];  Service: Endoscopy;  Laterality: N/A;  . ESOPHAGOGASTRODUODENOSCOPY N/A 10/18/2015   Procedure: ESOPHAGOGASTRODUODENOSCOPY (EGD);  Surgeon: Rogene Houston, MD;  Location: [facility];  Service: Endoscopy;  Laterality: N/A;  2:55     No Known Allergies    Family History  Problem Relation Age of Onset  . Heart attack Father 56  . Lung cancer Mother 65     Social History Christina Boyle reports that she has been smoking cigarettes. She started smoking about 49 years ago. She has a 50.00 pack-year smoking history. She has never used smokeless tobacco. Christina Boyle reports no history of alcohol use.   Review of Systems CONSTITUTIONAL: No weight loss, fever, chills, weakness or fatigue.  HEENT: Eyes: No visual loss, blurred vision, double vision or yellow sclerae.No hearing loss, sneezing, congestion, runny nose or sore throat.  SKIN: No rash or itching.  CARDIOVASCULAR: per hpi RESPIRATORY: No shortness of breath, cough or sputum.  GASTROINTESTINAL: No anorexia, nausea, vomiting or diarrhea. No abdominal pain or blood.   GENITOURINARY: No burning on urination, no polyuria NEUROLOGICAL: No headache, dizziness, syncope, paralysis, ataxia, numbness or tingling in the extremities. No change in bowel or bladder control.  MUSCULOSKELETAL: No muscle, back pain, joint pain or stiffness.  LYMPHATICS: No enlarged nodes. No history of splenectomy.  PSYCHIATRIC: No history of depression or anxiety.  ENDOCRINOLOGIC: No reports of sweating, cold or heat intolerance. No polyuria or polydipsia.  Marland Kitchen   Physical Examination Today's Vitals   09/03/18 1055  BP: (!) 142/58  Pulse: 61  Temp: 98.9 F (37.2 C)  SpO2: 95%  Weight: 166 lb (75.3 kg)  Height: 5\' 2"  (1.575 m)   Body mass index is 30.36 kg/m.  Gen: resting comfortably, no acute distress HEENT: no scleral icterus, pupils equal round and reactive, no palptable cervical adenopathy,  CV: RRR, 3/6 systolic murmur rusb, bilateral carotid bruits Resp: Clear to auscultation bilaterally GI: abdomen is soft, non-tender, non-distended, normal bowel sounds, no hepatosplenomegaly MSK: extremities are warm, trace bilateral edema Skin: warm, no rash Neuro:  no focal deficits Psych: appropriate affect   Diagnostic Studies 12/2012 Echo LVEF 60-65%, mild to mod LVH, normal diastolic function, mild AS (valve area 1.5 VTI, mean grad 10), RV function mildly reduced   07/2011 Carotid US 1. Bilateral carotid bifurcation and proximal ICA plaque, resulting in less than 50% diameter stenosis. The exam does not exclude plaque ulceration or embolization. Continued surveillance recommended.  02/2006 Cath FINDINGS:  1. LV: 143/17/26. EF 65% without regional wall motion abnormality.  2. No aortic stenosis or mitral regurgitation.  3. Left main: There is an 80% stenosis of the mid vessel.  4. LAD: Moderate-size vessel giving rise to two small proximal  diagonals and a larger third diagonal. The LAD has heavy  calcification proximally and diffuse mild disease. The  third  diagonal has a 70% stenosis proximally. A small medial subdivision  of this has a 99% stenosis.  5. Circumflex: Moderate-sized vessel giving rise to a single  marginal. There is a 70% stenosis in the AV groove portion of the  vessel.  6. RCA: Moderate-size, dominant vessel. There is an 80% ostial  stenosis, a 40% stenosis of the mid vessel, and a 90% stenosis just  before the origin of the PDA.  7. Left subclavian artery: Diffuse 30% stenosis proximally. The LIMA  is widely patent.  8. Aortic coarctation with 30 mmHg translesional gradient.  9. Normal great vessel anatomy. There is mild ostial stenosis of the  left common carotid artery.  IMPRESSION/RECOMMENDATIONS:  1. Severe multivessel coronary disease.  2. Coarctation of aorta.  3. Normal left ventricular systolic function.  Will refer the to cardiac surgery for consideration of coronary artery  bypass grafting and repair of her coarctation.   Jan 2015 PFTs Reason for pulmonary function testing is shortness of breath.  1. Spirometry shows a moderate ventilatory defect with minimal airflow  obstruction.  2. Lung volumes show air trapping.  3. DLCO is normal.  4. Airway resistance is high confirming the presence of airflow  obstruction.  5. No significant bronchodilator improvement.  6. Arterial blood gas shows relative resting hypoxia.  7. This study is consistent with COPD considering the patient's  smoking history.   10/2013 Carotid US IMPRESSION: 1. Slight interval progression of left-sided heterogeneous atherosclerotic plaque which now results in an estimated 50- 69% diameter ICA stenosis. 2. Stable heterogeneous atherosclerotic plaque on the right resulting in less than 50% diameter ICA stenosis. 3. Vertebral arteries are patent with normal antegrade flow.  01/2014 Lexiscan MPI IMPRESSION: 1. Abnormal study. There is evidence of apical anterior/anterolateral ischemia.  Borderline TID ratio increased at 1.2 may also be indicative of subendocardial or balanced ischemia.  2. Normal left ventricular wall motion.  3. Left ventricular ejection fraction 62%  4. Intermediate-risk stress test findings*.   07/2014 echo Study Conclusions  - Left ventricle: The cavity size was normal. Wall thickness was increased in a pattern of mild LVH. Systolic function was normal. The estimated ejection fraction was in the range of 55% to 60%. Wall motion was normal; there were no regional wall motion abnormalities. Doppler parameters are consistent with elevated ventricular end-diastolic filling pressure. - Aortic valve: Valve area (VTI): 1.36 cm^2. Valve area (Vmax): 1.26 cm^2. Valve area (Vmean): 1.29 cm^2. - Mitral valve: There was mild regurgitation.  Valve area by continuity equation (using LVOT flow): 2.15 cm^2. - Left atrium: The atrium was mildly dilated. - Atrial septum: No defect or patent foramen ovale was identified.  06/2015 cath 1. Ost RCA lesion, 95% stenosed. Mid RCA lesion, 50% stenosed. Dist RCA lesion, 70% stenosed. Post Atrio lesion, 80% stenosed. 2. 100% occluded SVG-RPDA at the origin 3. Ost LM to LM lesion, 60% stenosed. 4. Likely ostial left main disease but with brisk antegrade flow in the LAD system. Most notable lesion is the diagonal lesion. 5. 3rd Diag lesion, 80% stenosed. 6. Ost Cx to Prox Cx lesion, 80% stenosed. Ost 1st Mrg to 1st Mrg lesion, 80% stenosed. 7. SVG-OM is widely patent with the exception of a 99% stenosis in the origin 8. LIMA was visualized by non-selective angiography due to inability to cannulate and . Significant disease in that portion of the left subclavian artery.   The patient truly has 2 culprit lesions: Ostial SVG-OM and the occluded SVG-RCA with now ostial RCA disease. Both lesions are likely be difficult PCI. As the diagnostic procedure was somewhat difficult as far as engaging the LIMA  graft, and significant amount of contrast was used with recent renal insufficiency, I felt it best to stop the procedure today with plans for having the patient return for 2 site PCI early next week.  Plan:  Patient will return to nursing unit for continued medical care.  Would start heparin back 6-8 hours following sheath removal.  I have loaded with Plavix 300 mg and ordered 75 mg daily.  She needs aggressive blood pressure management.  06/2015 PCI  Origin lesion, 99% stenosed. Post intervention, there is a 20% residual stenosis.  1. Successful but suboptimal stenting of the ostial SVG to the OM due to difficulty positioning the stent at the origin. As a result the stent was deployed but only the distal portion of the stent was in the SVG.   Plan: continue DAPT indefinitely. Would postpone any further PCI and allow stent to re- endothelialize. If she does have refractory symptoms I would consider her for PCI of the native RCA with rotational atherectomy at a later date. If asymptomatic I would treat this medically.   12/2016 echo Study Conclusions  - Left ventricle: The cavity size was normal. Wall thickness was increased in a pattern of mild LVH. Systolic function was vigorous. The estimated ejection fraction was in the range of 65% to 70%. Wall motion was normal; there were no regional wall motion abnormalities. Features are consistent with a pseudonormal left ventricular filling pattern, with concomitant abnormal relaxation and increased filling pressure (grade 2 diastolic dysfunction). - Aortic valve: Leaflet structure not well defined. Moderately calcified. There was mild to moderate stenosis. Mean gradient (S): 13 mm Hg. Peak gradient (S): 23 mm Hg. VTI ratio of LVOT to aortic valve: 0.51. Valve area (VTI): 1.46 cm^2. Valve area (Vmax): 1.52 cm^2. Valve area (Vmean): 1.54 cm^2. - Mitral valve: Calcified annulus. There was mild regurgitation. -  Left atrium: The atrium was mildly dilated. - Right atrium: Central venous pressure (est): 3 mm Hg. - Atrial septum: No defect or patent foramen ovale was identified. - Tricuspid valve: There was trivial regurgitation. - Pulmonary arteries: Systolic pressure could not be accurately estimated. - Pericardium, extracardiac: There was no pericardial effusion.  Impressions:  - Mild LVH with LVEF 65-70% and grade 2 diastolic dysfunction. Mild left atrial enlargement. Mild calcified mitral annulus with mild mitral regurgitation. Aortic valve structure is not well-defined, there is moderate  calcification and mild to moderate aortic stenosis based on gradients and valve area. Trivial tricuspid regurgitation.    Assessment and Plan   1. CAD  - no significant symptoms, continue current meds. Has been committed to indefinite DAPT  2. HTN -above goal. F/u labs, pending results would titrate up her losartan to 50mg  daily  3. Hyperlipidemia -at goal, continue current meds  4. Aortic stenosis -mild to moderate by last echo -- likely repeat echo next year  5. LE edema - recurrent issues with hypokalemia and AKI, we will change her lasix 20mg  to just prn - repeat BMET/Mg today   F/u 4 months  [name], M.D.

## 2018-09-03 NOTE — Patient Instructions (Signed)
Medication Instructions:  TAKE LASIX 20 MG DAILY AS NEEDED FOR SWELLING   Labwork: BMET  MAGNESIUM  Testing/Procedures: NONE  Follow-Up: Your physician wants you to follow-up in: 4 MONTHS.  You will receive a reminder letter in the mail two months in advance. If you don't receive a letter, please call our office to schedule the follow-up appointment.   Any Other Special Instructions Will Be Listed Below (If Applicable).     If you need a refill on your cardiac medications before your next appointment, please call your pharmacy.

## 2018-09-04 LAB — BASIC METABOLIC PANEL
BUN/Creatinine Ratio: 14 (calc) (ref 6–22)
BUN: 14 mg/dL (ref 7–25)
CO2: 23 mmol/L (ref 20–32)
Calcium: 9.9 mg/dL (ref 8.6–10.4)
Chloride: 106 mmol/L (ref 98–110)
Creat: 1.01 mg/dL — ABNORMAL HIGH (ref 0.50–0.99)
Glucose, Bld: 110 mg/dL (ref 65–139)
Potassium: 3.8 mmol/L (ref 3.5–5.3)
Sodium: 139 mmol/L (ref 135–146)

## 2018-09-04 LAB — MAGNESIUM: Magnesium: 2.2 mg/dL (ref 1.5–2.5)

## 2018-09-08 ENCOUNTER — Other Ambulatory Visit: Payer: Self-pay | Admitting: Cardiology

## 2018-09-08 ENCOUNTER — Telehealth: Payer: Self-pay

## 2018-09-08 NOTE — Telephone Encounter (Signed)
-----   Message from [name], MD sent at 09/07/2018 12:34 PM EDT ----- Labs look good, no changes   Zandra Abts MD

## 2018-09-08 NOTE — Telephone Encounter (Signed)
Left detailed message for pt 

## 2018-09-15 ENCOUNTER — Other Ambulatory Visit (HOSPITAL_COMMUNITY): Payer: Self-pay | Admitting: Pulmonary Disease

## 2018-09-15 DIAGNOSIS — Z1231 Encounter for screening mammogram for malignant neoplasm of breast: Secondary | ICD-10-CM

## 2018-10-04 ENCOUNTER — Other Ambulatory Visit: Payer: Self-pay | Admitting: Cardiology

## 2018-10-13 DIAGNOSIS — I2510 Atherosclerotic heart disease of native coronary artery without angina pectoris: Secondary | ICD-10-CM | POA: Diagnosis not present

## 2018-10-13 DIAGNOSIS — J449 Chronic obstructive pulmonary disease, unspecified: Secondary | ICD-10-CM | POA: Diagnosis not present

## 2018-10-13 DIAGNOSIS — I129 Hypertensive chronic kidney disease with stage 1 through stage 4 chronic kidney disease, or unspecified chronic kidney disease: Secondary | ICD-10-CM | POA: Diagnosis not present

## 2018-10-13 DIAGNOSIS — I5032 Chronic diastolic (congestive) heart failure: Secondary | ICD-10-CM | POA: Diagnosis not present

## 2018-10-22 ENCOUNTER — Telehealth (INDEPENDENT_AMBULATORY_CARE_PROVIDER_SITE_OTHER): Payer: Self-pay | Admitting: *Deleted

## 2018-10-22 ENCOUNTER — Other Ambulatory Visit (INDEPENDENT_AMBULATORY_CARE_PROVIDER_SITE_OTHER): Payer: Self-pay | Admitting: *Deleted

## 2018-10-22 MED ORDER — PANTOPRAZOLE SODIUM 40 MG PO TBEC: DELAYED_RELEASE_TABLET | ORAL | 0 refills | Status: DC

## 2018-10-22 NOTE — Telephone Encounter (Signed)
Patient has not been seen in our office since 03/12/2017 , she will need to have a OV prior to further refills.

## 2018-12-07 NOTE — Progress Notes (Addendum)
Subjective:    Patient ID: Christina Boyle, female    DOB: Jun 04, 1955, 63 y.o.   MRN: [id number]  HPI Christina Boyle is a 63 year old female with a past medical history of depression, hypertension, CAD, s/p CABG, NSTEMI 2017, drug eluting stent 06/2015 on Plavix and ASA, AS, coartction of aorta, COPD, cervical cancer, GERD and UGI bleed secondary to gastric ulcer 07/2014 and 07/2015.  She presents today for her annual GERD follow-up. She is taking pantoprazole 40 mg once daily. She denies having any dysphasia, heartburn or stomach pain.  No lower abdominal pain.  She is passing a normal formed brown bowel movement daily.  No rectal bleeding or hematochezia.  She is on Plavix.  She also takes aspirin 81 mg daily.  She avoids all other NSAIDs secondary to having bleeding gastric ulcers in 07/2014 and 5/ 2017. A follow-up repeat a follow-up EGD 809/2017 showed a scar in the gastric antrum without further evidence of ulcers. See EGD results below. She denies ever having a screening colonoscopy. Her daughter was diagnosed with colon cancer at the age of 46, she is well at the age of 24 at this time.  She has 3 sons and 1 other daughter. She thinks her other daughter had a colonoscopy but her sons have not.  Her mother died from breast cancer.  Father died at the age of 35 from heart attack. She was last seen by her cardiologist, Dr. Harl Bowie, September 03, 2018.  She reported her exam was stable and she is due for a follow-up appointment in 4 months.  Denies having any chest pain, palpitations, dizziness or shortness of breath.  EGD 10/18/2015: - Normal esophagus. - Z-line regular, 36 cm from the incisors. - Gastritis. - Scar in the gastric antrum. - Normal duodenal bulb and second portion of the duodenum. - No specimens collected.  EGD for melena and anemia 07/12/15 by Dr. Dorien Chihuahua: - Normal proximal esophagus and mid esophagus. - Plaques in the lower third of the esophagus.  - Non-bleeding gastric ulcers with  no stigmata of bleeding. - Non-bleeding gastric ulcer with pigmented material. - Duodenal erosions without bleeding. - Duodenal erosions without bleeding.  EGD 07/22/2014 for upper GI bleed/melena by Dr. [name]: 1) Two clean-based antral ulcers with irregular borders and no bleeding. In mid antrum on greater curve and posterior aspect.About 12 and 18 mm sizes.  Ulcer biopsies were benign, no evidence of H. pylori.  ECHO 2018:   Mild LVH with LVEF 65-70% and grade 2 diastolic dysfunction. Mild   left atrial enlargement. Mild calcified mitral annulus with mild   mitral regurgitation. Aortic valve structure is not well-defined,   there is moderate calcification and mild to moderate aortic   stenosis based on gradients and valve area. Trivial tricuspid   regurgitation  Past Medical History:  Diagnosis Date  . Bleeding ulcer 06/2015  . CAD (coronary artery disease)    Multivessel disease s/p CABG, DES SVG to OM  July 03 2015 (occluded SVG to PDA)  . Cervical cancer ([place])   . Chronic bronchitis   . Coarctation of aorta   . COPD (chronic obstructive pulmonary disease) ([place])   . Depression   . Essential hypertension   . GERD (gastroesophageal reflux disease)   . Heart murmur   . Hyperlipidemia   . Hypokalemia   . NSTEMI (non-ST elevated myocardial infarction) [facility])    April 2017  . Obesity (BMI 30-39.9)    Past Surgical History:  Procedure Laterality Date  . BREAST BIOPSY Left    Benign  . CARDIAC CATHETERIZATION N/A 06/30/2015   Procedure: Left Heart Cath and Cors/Grafts Angiography;  Surgeon: Leonie Man, MD;  Location: [place] CV LAB;  Service: Cardiovascular;  Laterality: N/A;  . CARDIAC CATHETERIZATION N/A 07/03/2015   Procedure: Coronary Stent Intervention;  Surgeon: Peter M Martinique, MD;  Location: [place] [facility];  Service: Cardiovascular;  Laterality: N/A;  . CERVICAL CONE BIOPSY    . CHOLECYSTECTOMY    . CORONARY ARTERY BYPASS GRAFT  02/2006   LIMA to LAD,  SVG to OM, SVG to PDA  . ESOPHAGOGASTRODUODENOSCOPY N/A 07/22/2014   Procedure: ESOPHAGOGASTRODUODENOSCOPY (EGD);  Surgeon: [name], MD;  Location: [facility] ENDOSCOPY;  Service: Endoscopy;  Laterality: N/A;  . ESOPHAGOGASTRODUODENOSCOPY N/A 07/12/2015   Procedure: ESOPHAGOGASTRODUODENOSCOPY (EGD);  Surgeon: Rogene Houston, MD;  [facility];  Service: Endoscopy;  Laterality: N/A;  . ESOPHAGOGASTRODUODENOSCOPY N/A 10/18/2015   Procedure: ESOPHAGOGASTRODUODENOSCOPY (EGD);  Surgeon: Rogene Houston, MD;  [facility];  Service: Endoscopy;  Laterality: N/A;  2:55   Current Outpatient Medications on File Prior to Visit  Medication Sig Dispense Refill  . albuterol (PROVENTIL HFA;VENTOLIN HFA) 108 (90 Base) MCG/ACT inhaler Inhale 1-2 puffs into the lungs every 6 (six) hours as needed for wheezing or shortness of breath. 1 Inhaler 0  . amLODipine (NORVASC) 10 MG tablet TAKE 1 TABLET BY MOUTH DAILY (Patient taking differently: Take 10 mg by mouth daily. ) 90 tablet 3  . aspirin EC 81 MG EC tablet Take 1 tablet (81 mg total) by mouth daily.    . clopidogrel (PLAVIX) 75 MG tablet Take 1 tablet by mouth once daily with breakfast 90 tablet 3  . ezetimibe (ZETIA) 10 MG tablet Take 1 tablet by mouth once daily 90 tablet 3  . furosemide (LASIX) 20 MG tablet Take 1 tablet (20 mg total) by mouth daily as needed. 30 tablet 6  . hydrALAZINE (APRESOLINE) 100 MG tablet TAKE 1 TABLET BY MOUTH THREE TIMES DAILY 270 tablet 0  . isosorbide mononitrate (IMDUR) 120 MG 24 hr tablet Take 1 tablet (120 mg total) by mouth daily. 90 tablet 3  . losartan (COZAAR) 25 MG tablet Take 1 tablet (25 mg total) by mouth daily. 90 tablet 3  . metoprolol tartrate (LOPRESSOR) 50 MG tablet Take 75 mg by mouth 2 (two) times daily.    . nitroGLYCERIN (NITROSTAT) 0.4 MG SL tablet Place 1 tablet (0.4 mg total) every 5 (five) minutes as needed under the tongue for chest pain. 25 tablet 3  . pantoprazole (PROTONIX) 40 MG tablet  TAKE 1 TABLET BY MOUTH ONCE DAILY BEFORE BREAKFAST 90 tablet 0  . rosuvastatin (CRESTOR) 40 MG tablet TAKE 1 TABLET BY MOUTH ONCE DAILY (Patient taking differently: Take 40 mg by mouth daily. ) 90 tablet 3   No current facility-administered medications on file prior to visit.    No Known Allergies  Review of Systems the HPI, all other systems reviewed and are negative    Objective:   Physical Exam  Blood pressure (!) 161/68, pulse 65, temperature 98.4 F (36.9 C), temperature source Oral, height 5\' 2"  (1.575 m), weight 164 lb 6.4 oz (74.6 kg). General: 63 year old female in no acute distress Eyes: Sclera nonicteric, conjunctiva pink Mouth: Upper dentures, no lower teeth teeth Neck: Supple, no lymphadenopathy or thyromegaly Heart: Regular rate and rhythm, 3/6 pansystolic murmur Lungs: Breath sounds clear throughout Abdomen: Soft, nontender, no  masses or organomegaly, right upper quadrant scar, positive bowel sounds to all 4 quadrants Extremities: No edema Neuro: Alert and oriented x4, no focal deficits     Assessment & Plan:   1. GERD, history of upper GI bleed secondary to gastric ulcers in 2016 and 2017 without recurrence -Continue pantoprazole 40 mg once daily -Okay to continue aspirin due to cardiac history, continue to avoid all other NSAIDs  2. Colon cancer screening. I discussed the benefits and risks of a Cologuard test versus a colonoscopy.  Her daughter was diagnosed with colon cancer at the age of 46 therefore a conventional colonoscopy is preferred.  She wishes to proceed with a colonoscopy. -Colonoscopy benefits and risks discussed, risks sedation, risk of bleeding, perforation and infection  3. CAD, s/p CABG 3 vessels 2007, NSTEMI with one stent 2017 on Plavix and aspirin, mild to moderate aortic stenosis -Our office will contact cardiologist, Dr. Harl Bowie, to verify Plavix and aspirin instructions prior to her colonoscopy -I will consult with Dr. Laural Golden to verify if a  full cardiac clearance is required or not as she saw Dr. Harl Bowie in June and her cardiac status was stable at that time

## 2018-12-09 ENCOUNTER — Ambulatory Visit (INDEPENDENT_AMBULATORY_CARE_PROVIDER_SITE_OTHER): Payer: Medicare Other | Admitting: Nurse Practitioner

## 2018-12-09 ENCOUNTER — Other Ambulatory Visit: Payer: Self-pay

## 2018-12-09 ENCOUNTER — Encounter (INDEPENDENT_AMBULATORY_CARE_PROVIDER_SITE_OTHER): Payer: Self-pay | Admitting: Nurse Practitioner

## 2018-12-09 VITALS — BP 161/68 | HR 65 | Temp 98.4°F | Ht 62.0 in | Wt 164.4 lb

## 2018-12-09 DIAGNOSIS — Z1211 Encounter for screening for malignant neoplasm of colon: Secondary | ICD-10-CM | POA: Diagnosis not present

## 2018-12-09 DIAGNOSIS — K219 Gastro-esophageal reflux disease without esophagitis: Secondary | ICD-10-CM

## 2018-12-09 NOTE — Patient Instructions (Signed)
1.  Schedule a colonoscopy  2 continue pantoprazole 40 mg once daily  3.  Further follow-up to be determined after colonoscopy completed

## 2018-12-24 ENCOUNTER — Other Ambulatory Visit (INDEPENDENT_AMBULATORY_CARE_PROVIDER_SITE_OTHER): Payer: Self-pay | Admitting: *Deleted

## 2018-12-24 DIAGNOSIS — Z1211 Encounter for screening for malignant neoplasm of colon: Secondary | ICD-10-CM | POA: Insufficient documentation

## 2018-12-30 ENCOUNTER — Other Ambulatory Visit: Payer: Self-pay | Admitting: Cardiology

## 2019-01-02 DIAGNOSIS — Z23 Encounter for immunization: Secondary | ICD-10-CM | POA: Diagnosis not present

## 2019-01-11 ENCOUNTER — Other Ambulatory Visit: Payer: Self-pay | Admitting: Cardiology

## 2019-01-12 ENCOUNTER — Telehealth (INDEPENDENT_AMBULATORY_CARE_PROVIDER_SITE_OTHER): Payer: Self-pay | Admitting: *Deleted

## 2019-01-12 ENCOUNTER — Other Ambulatory Visit (INDEPENDENT_AMBULATORY_CARE_PROVIDER_SITE_OTHER): Payer: Self-pay | Admitting: *Deleted

## 2019-01-12 ENCOUNTER — Encounter (INDEPENDENT_AMBULATORY_CARE_PROVIDER_SITE_OTHER): Payer: Self-pay | Admitting: *Deleted

## 2019-01-12 DIAGNOSIS — Z1211 Encounter for screening for malignant neoplasm of colon: Secondary | ICD-10-CM

## 2019-01-12 MED ORDER — PEG 3350-KCL-NA BICARB-NACL 420 G PO SOLR: 4000.0000 mL | Freq: Once | ORAL | 0 refills | Status: AC

## 2019-01-12 NOTE — Telephone Encounter (Signed)
Please advise 

## 2019-01-12 NOTE — Telephone Encounter (Signed)
Patient needs trilyte TCS sch'd 12/18

## 2019-01-12 NOTE — Telephone Encounter (Signed)
Patient is scheduled for colonoscopy 02/26/19 - she needs to stop ASA 2 days & Plavix 5 days -- please advise if ok to stop, thanks

## 2019-01-12 NOTE — Telephone Encounter (Signed)
Patient aware.

## 2019-01-12 NOTE — Telephone Encounter (Signed)
Yes that is fine to hold those meds   Zandra Abts MD

## 2019-01-13 DIAGNOSIS — I5032 Chronic diastolic (congestive) heart failure: Secondary | ICD-10-CM | POA: Diagnosis not present

## 2019-01-13 DIAGNOSIS — I10 Essential (primary) hypertension: Secondary | ICD-10-CM | POA: Diagnosis not present

## 2019-01-13 DIAGNOSIS — I2510 Atherosclerotic heart disease of native coronary artery without angina pectoris: Secondary | ICD-10-CM | POA: Diagnosis not present

## 2019-01-13 DIAGNOSIS — J449 Chronic obstructive pulmonary disease, unspecified: Secondary | ICD-10-CM | POA: Diagnosis not present

## 2019-01-15 ENCOUNTER — Telehealth: Payer: Self-pay | Admitting: Cardiology

## 2019-01-15 MED ORDER — FUROSEMIDE 20 MG PO TABS: 20.0000 mg | ORAL_TABLET | Freq: Every day | ORAL | 6 refills | Status: DC | PRN

## 2019-01-15 NOTE — Telephone Encounter (Signed)
Done

## 2019-01-15 NOTE — Telephone Encounter (Signed)
°*  STAT* If patient is at the pharmacy, call can be transferred to refill team.   1. Which medications need to be refilled? furosemide (LASIX) 20 MG tablet  2. Which pharmacy/location (including street and city if local pharmacy) is medication to be sent to? [facility] [place]  3. Do they need a 30 day or 90 day supply?

## 2019-02-24 ENCOUNTER — Encounter (HOSPITAL_COMMUNITY): Payer: Medicare Other

## 2019-02-24 ENCOUNTER — Other Ambulatory Visit (HOSPITAL_COMMUNITY): Payer: Medicare Other

## 2019-02-26 ENCOUNTER — Encounter (HOSPITAL_COMMUNITY): Payer: Self-pay

## 2019-02-26 ENCOUNTER — Ambulatory Visit (HOSPITAL_COMMUNITY): Admit: 2019-02-26 | Payer: Medicare Other | Admitting: Internal Medicine

## 2019-02-26 SURGERY — COLONOSCOPY WITH PROPOFOL
Anesthesia: Monitor Anesthesia Care

## 2019-03-05 DIAGNOSIS — K5900 Constipation, unspecified: Secondary | ICD-10-CM

## 2019-03-05 DIAGNOSIS — I10 Essential (primary) hypertension: Secondary | ICD-10-CM | POA: Insufficient documentation

## 2019-03-05 DIAGNOSIS — I2510 Atherosclerotic heart disease of native coronary artery without angina pectoris: Secondary | ICD-10-CM

## 2019-03-05 DIAGNOSIS — E785 Hyperlipidemia, unspecified: Secondary | ICD-10-CM

## 2019-03-05 DIAGNOSIS — I129 Hypertensive chronic kidney disease with stage 1 through stage 4 chronic kidney disease, or unspecified chronic kidney disease: Secondary | ICD-10-CM

## 2019-03-05 DIAGNOSIS — J449 Chronic obstructive pulmonary disease, unspecified: Secondary | ICD-10-CM

## 2019-03-05 DIAGNOSIS — I509 Heart failure, unspecified: Secondary | ICD-10-CM

## 2019-03-05 DIAGNOSIS — F17200 Nicotine dependence, unspecified, uncomplicated: Secondary | ICD-10-CM

## 2019-03-05 DIAGNOSIS — I5032 Chronic diastolic (congestive) heart failure: Secondary | ICD-10-CM

## 2019-03-05 DIAGNOSIS — K922 Gastrointestinal hemorrhage, unspecified: Secondary | ICD-10-CM

## 2019-03-05 DIAGNOSIS — D62 Acute posthemorrhagic anemia: Secondary | ICD-10-CM

## 2019-03-30 ENCOUNTER — Other Ambulatory Visit: Payer: Self-pay | Admitting: Cardiology

## 2019-04-12 ENCOUNTER — Other Ambulatory Visit: Payer: Self-pay | Admitting: Cardiology

## 2019-04-13 ENCOUNTER — Other Ambulatory Visit (INDEPENDENT_AMBULATORY_CARE_PROVIDER_SITE_OTHER): Payer: Self-pay | Admitting: Gastroenterology

## 2019-04-13 MED ORDER — PANTOPRAZOLE SODIUM 40 MG PO TBEC: DELAYED_RELEASE_TABLET | ORAL | 2 refills | Status: DC

## 2019-05-21 ENCOUNTER — Other Ambulatory Visit: Payer: Self-pay | Admitting: Cardiology

## 2019-05-25 ENCOUNTER — Telehealth: Payer: Self-pay | Admitting: Cardiology

## 2019-05-25 NOTE — Telephone Encounter (Signed)

## 2019-05-27 ENCOUNTER — Telehealth (INDEPENDENT_AMBULATORY_CARE_PROVIDER_SITE_OTHER): Payer: Medicare Other | Admitting: Cardiology

## 2019-05-27 ENCOUNTER — Encounter: Payer: Self-pay | Admitting: Cardiology

## 2019-05-27 VITALS — Ht 62.0 in | Wt 154.0 lb

## 2019-05-27 DIAGNOSIS — Z7982 Long term (current) use of aspirin: Secondary | ICD-10-CM

## 2019-05-27 DIAGNOSIS — I2510 Atherosclerotic heart disease of native coronary artery without angina pectoris: Secondary | ICD-10-CM

## 2019-05-27 DIAGNOSIS — F17210 Nicotine dependence, cigarettes, uncomplicated: Secondary | ICD-10-CM | POA: Diagnosis not present

## 2019-05-27 DIAGNOSIS — I350 Nonrheumatic aortic (valve) stenosis: Secondary | ICD-10-CM

## 2019-05-27 DIAGNOSIS — E782 Mixed hyperlipidemia: Secondary | ICD-10-CM | POA: Diagnosis not present

## 2019-05-27 DIAGNOSIS — R002 Palpitations: Secondary | ICD-10-CM

## 2019-05-27 DIAGNOSIS — I10 Essential (primary) hypertension: Secondary | ICD-10-CM | POA: Diagnosis not present

## 2019-05-27 DIAGNOSIS — Z7902 Long term (current) use of antithrombotics/antiplatelets: Secondary | ICD-10-CM

## 2019-05-27 DIAGNOSIS — R600 Localized edema: Secondary | ICD-10-CM | POA: Diagnosis not present

## 2019-05-27 NOTE — Progress Notes (Signed)
Virtual Visit via Telephone Note   This visit type was conducted due to national recommendations for restrictions regarding the COVID-19 Pandemic (e.g. social distancing) in an effort to limit this patient's exposure and mitigate transmission in our community.  Due to her co-morbid illnesses, this patient is at least at moderate risk for complications without adequate follow up.  This format is felt to be most appropriate for this patient at this time.  The patient did not have access to video technology/had technical difficulties with video requiring transitioning to audio format only (telephone).  All issues noted in this document were discussed and addressed.  No physical exam could be performed with this format.  Please refer to the patient's chart for her  consent to telehealth for [facility].   The patient was identified using 2 identifiers.  Date:  05/27/2019   ID:  Christina Boyle, DOB 1955-08-20, MRN [id number]  Patient Location: Home Provider Location: Office  PCP:  Patient, No Pcp Per  Cardiologist:  Carlyle Dolly, MD  Electrophysiologist:  None   Evaluation Performed:  Follow-Up Visit  Chief Complaint:  Normal affect  History of Present Illness:    Christina Boyle is a 64 y.o. female seen today for follow up of the following medical problems.    1. CAD  - prior CABG in 2007 3 vessel (LIMA to LAD, SVG to OM, SVG to PDA) - Echo 12/2012 LVEF 60-65%  - 01/2014 exercise MPI: did not reach THR, exercise limited by SOB and chest pain. Converted to [employer]. Apical anterior and anterolateral ischemia, intermediate risk. LVEF 62% - 06/2015 cath received stent to SVG-OM suboptimal result. If continued symptoms consider PCI ofRCA 06/2015 echo: LVEF 65-70%, no WMAs, grade I diastolic dysfunction, mild to moderate AS - she is on indefinite DAPT   - no recent chest pain. Denies any SOB/doe - compliant with meds  2. HTN - compliant with meds   3.  Hyperlipidemia - compliant with crestor and zetia - has not had recent labs  4.Aortic stenosis 12/2016 echo mild to moderateAS - no recent symptoms   5. Carotid stenosis - carotid US 01/2015 with bilateral 1-39% disease.  6. Hypokalemia - related to diureticsin the past - recurrent issue during 06/2018 ER visit. K 3.1 Cr 1.44 - most recent labs K was normal  7. LE edema - no recent issues. Taking the lasix daily 20mg    SH: enjoys spending time outside, planting flowers.  Has not gotten covid vaccine yet.     The patient does not have symptoms concerning for COVID-19 infection (fever, chills, cough, or new shortness of breath).    Past Medical History:  Diagnosis Date  . Acute posthemorrhagic anemia   . Atherosclerotic heart disease of native coronary artery without angina pectoris   . Bleeding ulcer 06/2015  . CAD (coronary artery disease)    Multivessel disease s/p CABG, DES SVG to OM  July 03 2015 (occluded SVG to PDA)  . Cervical cancer ([place])   . Chronic bronchitis   . Chronic diastolic (congestive) heart failure ([place])   . Chronic obstructive pulmonary disease, unspecified ([place])   . Coarctation of aorta   . Constipation, unspecified   . COPD (chronic obstructive pulmonary disease) ([place])   . Depression   . Essential (primary) hypertension   . Essential hypertension   . Gastrointestinal hemorrhage, unspecified   . GERD (gastroesophageal reflux disease)   . Heart disease   . Heart failure, unspecified ([place])   .  Heart murmur   . Hyperlipidemia   . Hyperlipidemia, unspecified   . Hypertensive chronic kidney disease with stage 1 through stage 4 chronic kidney disease, or unspecified chronic kidney disease   . Hypokalemia   . Hypokalemia   . Hypomagnesemia   . Malignant neoplasm of cervix uteri, unspecified ([place])   . Nicotine dependence, unspecified, uncomplicated   . NSTEMI (non-ST elevated myocardial infarction) [facility])    April 2017  . NSTEMI  (non-ST elevated myocardial infarction) ([place])   . Obesity (BMI 30-39.9)   . Other nonrheumatic aortic valve disorders    Past Surgical History:  Procedure Laterality Date  . BREAST BIOPSY Left    Benign  . CARDIAC CATHETERIZATION N/A 06/30/2015   Procedure: Left Heart Cath and Cors/Grafts Angiography;  Surgeon: Leonie Man, MD;  Location: [place];  Service: Cardiovascular;  Laterality: N/A;  . CARDIAC CATHETERIZATION N/A 07/03/2015   Procedure: Coronary Stent Intervention;  Surgeon: Peter M Martinique, MD;  Location: [place] [facility];  Service: Cardiovascular;  Laterality: N/A;  . CERVICAL CONE BIOPSY    . CHOLECYSTECTOMY    . CORONARY ARTERY BYPASS GRAFT  02/2006   LIMA to LAD, SVG to OM, SVG to PDA  . ESOPHAGOGASTRODUODENOSCOPY N/A 07/22/2014   Procedure: ESOPHAGOGASTRODUODENOSCOPY (EGD);  Surgeon: [name], MD;  Location: [facility] ENDOSCOPY;  Service: Endoscopy;  Laterality: N/A;  . ESOPHAGOGASTRODUODENOSCOPY N/A 07/12/2015   Procedure: ESOPHAGOGASTRODUODENOSCOPY (EGD);  Surgeon: Rogene Houston, MD;  [facility];  Service: Endoscopy;  Laterality: N/A;  . ESOPHAGOGASTRODUODENOSCOPY N/A 10/18/2015   Procedure: ESOPHAGOGASTRODUODENOSCOPY (EGD);  Surgeon: Rogene Houston, MD;  Location: [facility];  Service: Endoscopy;  Laterality: N/A;  2:55     No outpatient medications have been marked as taking for the 05/27/19 encounter (Appointment) with [name], MD.     Allergies:   Patient has no known allergies.   Social History   Tobacco Use  . Smoking status: Current Every Day Smoker    Packs/day: 1.00    Years: 50.00    Pack years: 50.00    Types: Cigarettes    Start date: 05/21/1969  . Smokeless tobacco: Never Used  Substance Use Topics  . Alcohol use: No    Alcohol/week: 0.0 standard drinks  . Drug use: No     Family Hx: The patient's family history includes Heart attack (age of onset: 64) in her father; Lung cancer (age of onset: 60) in her  mother.  ROS:   Please see the history of present illness.     All other systems reviewed and are negative.   Prior CV studies:   The following studies were reviewed today:  12/2012 Echo LVEF 60-65%, mild to mod LVH, normal diastolic function, mild AS (valve area 1.5 VTI, mean grad 10), RV function mildly reduced   07/2011 Carotid US 1. Bilateral carotid bifurcation and proximal ICA plaque, resulting in less than 50% diameter stenosis. The exam does not exclude plaque ulceration or embolization. Continued surveillance recommended.  02/2006 Cath FINDINGS:  1. LV: 143/17/26. EF 65% without regional wall motion abnormality.  2. No aortic stenosis or mitral regurgitation.  3. Left main: There is an 80% stenosis of the mid vessel.  4. LAD: Moderate-size vessel giving rise to two small proximal  diagonals and a larger third diagonal. The LAD has heavy  calcification proximally and diffuse mild disease. The third  diagonal has a 70% stenosis proximally. A small medial subdivision  of this  has a 99% stenosis.  5. Circumflex: Moderate-sized vessel giving rise to a single  marginal. There is a 70% stenosis in the AV groove portion of the  vessel.  6. RCA: Moderate-size, dominant vessel. There is an 80% ostial  stenosis, a 40% stenosis of the mid vessel, and a 90% stenosis just  before the origin of the PDA.  7. Left subclavian artery: Diffuse 30% stenosis proximally. The LIMA  is widely patent.  8. Aortic coarctation with 30 mmHg translesional gradient.  9. Normal great vessel anatomy. There is mild ostial stenosis of the  left common carotid artery.  IMPRESSION/RECOMMENDATIONS:  1. Severe multivessel coronary disease.  2. Coarctation of aorta.  3. Normal left ventricular systolic function.  Will refer the to cardiac surgery for consideration of coronary artery  bypass grafting and repair of her coarctation.   Jan 2015 PFTs Reason for  pulmonary function testing is shortness of breath.  1. Spirometry shows a moderate ventilatory defect with minimal airflow  obstruction.  2. Lung volumes show air trapping.  3. DLCO is normal.  4. Airway resistance is high confirming the presence of airflow  obstruction.  5. No significant bronchodilator improvement.  6. Arterial blood gas shows relative resting hypoxia.  7. This study is consistent with COPD considering the patient's  smoking history.   10/2013 Carotid US IMPRESSION: 1. Slight interval progression of left-sided heterogeneous atherosclerotic plaque which now results in an estimated 50- 69% diameter ICA stenosis. 2. Stable heterogeneous atherosclerotic plaque on the right resulting in less than 50% diameter ICA stenosis. 3. Vertebral arteries are patent with normal antegrade flow.  01/2014 Lexiscan MPI IMPRESSION: 1. Abnormal study. There is evidence of apical anterior/anterolateral ischemia. Borderline TID ratio increased at 1.2 may also be indicative of subendocardial or balanced ischemia.  2. Normal left ventricular wall motion.  3. Left ventricular ejection fraction 62%  4. Intermediate-risk stress test findings*.   07/2014 echo Study Conclusions  - Left ventricle: The cavity size was normal. Wall thickness was increased in a pattern of mild LVH. Systolic function was normal. The estimated ejection fraction was in the range of 55% to 60%. Wall motion was normal; there were no regional wall motion abnormalities. Doppler parameters are consistent with elevated ventricular end-diastolic filling pressure. - Aortic valve: Valve area (VTI): 1.36 cm^2. Valve area (Vmax): 1.26 cm^2. Valve area (Vmean): 1.29 cm^2. - Mitral valve: There was mild regurgitation. Valve area by continuity equation (using LVOT flow): 2.15 cm^2. - Left atrium: The atrium was mildly dilated. - Atrial septum: No defect or patent foramen ovale was  identified.  06/2015 cath 1. Ost RCA lesion, 95% stenosed. Mid RCA lesion, 50% stenosed. Dist RCA lesion, 70% stenosed. Post Atrio lesion, 80% stenosed. 2. 100% occluded SVG-RPDA at the origin 3. Ost LM to LM lesion, 60% stenosed. 4. Likely ostial left main disease but with brisk antegrade flow in the LAD system. Most notable lesion is the diagonal lesion. 5. 3rd Diag lesion, 80% stenosed. 6. Ost Cx to Prox Cx lesion, 80% stenosed. Ost 1st Mrg to 1st Mrg lesion, 80% stenosed. 7. SVG-OM is widely patent with the exception of a 99% stenosis in the origin 8. LIMA was visualized by non-selective angiography due to inability to cannulate and . Significant disease in that portion of the left subclavian artery.   The patient truly has 2 culprit lesions: Ostial SVG-OM and the occluded SVG-RCA with now ostial RCA disease. Both lesions are likely be difficult PCI. As the diagnostic procedure was  somewhat difficult as far as engaging the LIMA graft, and significant amount of contrast was used with recent renal insufficiency, I felt it best to stop the procedure today with plans for having the patient return for 2 site PCI early next week.  Plan:  Patient will return to nursing unit for continued medical care.  Would start heparin back 6-8 hours following sheath removal.  I have loaded with Plavix 300 mg and ordered 75 mg daily.  She needs aggressive blood pressure management.  06/2015 PCI  Origin lesion, 99% stenosed. Post intervention, there is a 20% residual stenosis.  1. Successful but suboptimal stenting of the ostial SVG to the OM due to difficulty positioning the stent at the origin. As a result the stent was deployed but only the distal portion of the stent was in the SVG.   Plan: continue DAPT indefinitely. Would postpone any further PCI and allow stent to re- endothelialize. If she does have refractory symptoms I would consider her for PCI of the native RCA with rotational  atherectomy at a later date. If asymptomatic I would treat this medically.   12/2016 echo Study Conclusions  - Left ventricle: The cavity size was normal. Wall thickness was increased in a pattern of mild LVH. Systolic function was vigorous. The estimated ejection fraction was in the range of 65% to 70%. Wall motion was normal; there were no regional wall motion abnormalities. Features are consistent with a pseudonormal left ventricular filling pattern, with concomitant abnormal relaxation and increased filling pressure (grade 2 diastolic dysfunction). - Aortic valve: Leaflet structure not well defined. Moderately calcified. There was mild to moderate stenosis. Mean gradient (S): 13 mm Hg. Peak gradient (S): 23 mm Hg. VTI ratio of LVOT to aortic valve: 0.51. Valve area (VTI): 1.46 cm^2. Valve area (Vmax): 1.52 cm^2. Valve area (Vmean): 1.54 cm^2. - Mitral valve: Calcified annulus. There was mild regurgitation. - Left atrium: The atrium was mildly dilated. - Right atrium: Central venous pressure (est): 3 mm Hg. - Atrial septum: No defect or patent foramen ovale was identified. - Tricuspid valve: There was trivial regurgitation. - Pulmonary arteries: Systolic pressure could not be accurately estimated. - Pericardium, extracardiac: There was no pericardial effusion.  Impressions:  - Mild LVH with LVEF 65-70% and grade 2 diastolic dysfunction. Mild left atrial enlargement. Mild calcified mitral annulus with mild mitral regurgitation. Aortic valve structure is not well-defined, there is moderate calcification and mild to moderate aortic stenosis based on gradients and valve area. Trivial tricuspid regurgitation.  Labs/Other Tests and Data Reviewed:    EKG:  No ECG reviewed.  Recent Labs: 06/17/2018: Hemoglobin 12.9; Platelets 192 09/03/2018: BUN 14; Creat 1.01; Magnesium 2.2; Potassium 3.8; Sodium 139   Recent Lipid Panel Lab Results    Component Value Date/Time   CHOL 148 04/14/2018 12:00 AM   TRIG 85 04/14/2018 12:00 AM   HDL 53 04/14/2018 12:00 AM   CHOLHDL 2.8 11/18/2017 08:07 AM   LDLCALC 78 04/14/2018 12:00 AM   LDLCALC 69 11/18/2017 08:07 AM    Wt Readings from Last 3 Encounters:  12/09/18 164 lb 6.4 oz (74.6 kg)  09/03/18 166 lb (75.3 kg)  06/17/18 166 lb (75.3 kg)     Objective:    Vital Signs:   Today's Vitals   05/27/19 1256  Weight: 154 lb (69.9 kg)  Height: 5\' 2"  (1.575 m)   Body mass index is 28.17 kg/m. Normal affect. Normal speech pattern and tone. Comfortable, no apparent distress. No audible signs of  SOB or wheezing.   ASSESSMENT & PLAN:    1. CAD  - . Has been committed to indefinite DAPT - no symptoms, continue current meds  2. HTN -continue current meds  3. Hyperlipidemia -repeat lipid panel  4. Aortic stenosis -mild to moderate by last echo --we will repeat echo toward the end of 2021  5. LE edema - controlled   Obtain annual labs, she is in between pcps.    COVID-19 Education: The signs and symptoms of COVID-19 were discussed with the patient and how to seek care for testing (follow up with PCP or arrange E-visit).  The importance of social distancing was discussed today.  Time:   Today, I have spent 22 minutes with the patient with telehealth technology discussing the above problems.     Medication Adjustments/Labs and Tests Ordered: Current medicines are reviewed at length with the patient today.  Concerns regarding medicines are outlined above.   Tests Ordered: No orders of the defined types were placed in this encounter.   Medication Changes: No orders of the defined types were placed in this encounter.   Follow Up:  In Person in 6 month(s)  Signed, Carlyle Dolly, MD  05/27/2019 12:40 PM    [place]

## 2019-05-28 NOTE — Addendum Note (Signed)
Addended by: Debbora Lacrosse R on: 05/28/2019 12:16 PM   Modules accepted: Orders

## 2019-05-28 NOTE — Patient Instructions (Signed)
Medication Instructions:  [email or web address]   Labwork: Cbc cmet tsh a12c Lipid (fasting)  magnesium   Testing/Procedures: none  Follow-Up: Your physician wants you to follow-up in: 6 months.  You will receive a reminder letter in the mail two months in advance. If you don't receive a letter, please call our office to schedule the follow-up appointment.   Any Other Special Instructions Will Be Listed Below (If Applicable).     If you need a refill on your cardiac medications before your next appointment, please call your pharmacy.

## 2019-06-07 ENCOUNTER — Other Ambulatory Visit: Payer: Self-pay | Admitting: Cardiology

## 2019-06-15 DIAGNOSIS — R002 Palpitations: Secondary | ICD-10-CM | POA: Diagnosis not present

## 2019-06-15 DIAGNOSIS — E782 Mixed hyperlipidemia: Secondary | ICD-10-CM | POA: Diagnosis not present

## 2019-06-15 DIAGNOSIS — I350 Nonrheumatic aortic (valve) stenosis: Secondary | ICD-10-CM | POA: Diagnosis not present

## 2019-06-15 DIAGNOSIS — I10 Essential (primary) hypertension: Secondary | ICD-10-CM | POA: Diagnosis not present

## 2019-06-15 DIAGNOSIS — I2510 Atherosclerotic heart disease of native coronary artery without angina pectoris: Secondary | ICD-10-CM | POA: Diagnosis not present

## 2019-06-15 DIAGNOSIS — R739 Hyperglycemia, unspecified: Secondary | ICD-10-CM | POA: Diagnosis not present

## 2019-06-16 LAB — COMPREHENSIVE METABOLIC PANEL
AG Ratio: 1.8 (calc) (ref 1.0–2.5)
ALT: 13 U/L (ref 6–29)
AST: 19 U/L (ref 10–35)
Albumin: 4.5 g/dL (ref 3.6–5.1)
Alkaline phosphatase (APISO): 65 U/L (ref 37–153)
BUN/Creatinine Ratio: 9 (calc) (ref 6–22)
BUN: 11 mg/dL (ref 7–25)
CO2: 26 mmol/L (ref 20–32)
Calcium: 9.6 mg/dL (ref 8.6–10.4)
Chloride: 104 mmol/L (ref 98–110)
Creat: 1.26 mg/dL — ABNORMAL HIGH (ref 0.50–0.99)
Globulin: 2.5 g/dL (calc) (ref 1.9–3.7)
Glucose, Bld: 103 mg/dL — ABNORMAL HIGH (ref 65–99)
Potassium: 3.5 mmol/L (ref 3.5–5.3)
Sodium: 138 mmol/L (ref 135–146)
Total Bilirubin: 0.5 mg/dL (ref 0.2–1.2)
Total Protein: 7 g/dL (ref 6.1–8.1)

## 2019-06-16 LAB — CBC WITH DIFFERENTIAL/PLATELET
Absolute Monocytes: 763 cells/uL (ref 200–950)
Basophils Absolute: 72 cells/uL (ref 0–200)
Basophils Relative: 1 %
Eosinophils Absolute: 252 cells/uL (ref 15–500)
Eosinophils Relative: 3.5 %
HCT: 43.7 % (ref 35.0–45.0)
Hemoglobin: 13.7 g/dL (ref 11.7–15.5)
Lymphs Abs: 1534 cells/uL (ref 850–3900)
MCH: 28.4 pg (ref 27.0–33.0)
MCHC: 31.4 g/dL — ABNORMAL LOW (ref 32.0–36.0)
MCV: 90.7 fL (ref 80.0–100.0)
MPV: 12.2 fL (ref 7.5–12.5)
Monocytes Relative: 10.6 %
Neutro Abs: 4579 cells/uL (ref 1500–7800)
Neutrophils Relative %: 63.6 %
Platelets: 191 10*3/uL (ref 140–400)
RBC: 4.82 10*6/uL (ref 3.80–5.10)
RDW: 14.3 % (ref 11.0–15.0)
Total Lymphocyte: 21.3 %
WBC: 7.2 10*3/uL (ref 3.8–10.8)

## 2019-06-16 LAB — HEMOGLOBIN A1C
Hgb A1c MFr Bld: 5.4 % of total Hgb (ref <5.7)
Mean Plasma Glucose: 108 (calc)
eAG (mmol/L): 6 (calc)

## 2019-06-16 LAB — LIPID PANEL
Cholesterol: 145 mg/dL (ref <200)
HDL: 49 mg/dL — ABNORMAL LOW (ref >=50)
LDL Cholesterol (Calc): 76 mg/dL (calc)
Non-HDL Cholesterol (Calc): 96 mg/dL (calc) (ref <130)
Total CHOL/HDL Ratio: 3 (calc) (ref <5.0)
Triglycerides: 118 mg/dL (ref <150)

## 2019-06-16 LAB — MAGNESIUM: Magnesium: 2.3 mg/dL (ref 1.5–2.5)

## 2019-06-16 LAB — TSH: TSH: 2.02 mIU/L (ref 0.40–4.50)

## 2019-06-28 ENCOUNTER — Other Ambulatory Visit: Payer: Self-pay | Admitting: Cardiology

## 2019-06-28 MED ORDER — METOPROLOL TARTRATE 50 MG PO TABS: 75.0000 mg | ORAL_TABLET | Freq: Two times a day (BID) | ORAL | 1 refills | Status: DC

## 2019-06-28 MED ORDER — ISOSORBIDE MONONITRATE ER 120 MG PO TB24: ORAL_TABLET | ORAL | 1 refills | Status: DC

## 2019-06-28 NOTE — Telephone Encounter (Signed)
Refilled meds for pt

## 2019-06-28 NOTE — Telephone Encounter (Signed)
*  STAT* If patient is at the pharmacy, call can be transferred to refill team.   Patient did a phone visit on May 27, 2019   1. Which medications need to be refilled? isosorbide mononitrate (IMDUR) 120 MG 24 hr tablet metoprolol tartrate (LOPRESSOR) 50 MG tablet     2. Which pharmacy/location (including street and city if local pharmacy) is medication to be sent to? [facility] in [place], [place]   3. Do they need a 30 day or 90 day supply?

## 2019-06-30 ENCOUNTER — Other Ambulatory Visit: Payer: Self-pay | Admitting: Cardiology

## 2019-07-22 ENCOUNTER — Other Ambulatory Visit: Payer: Self-pay | Admitting: Cardiology

## 2019-09-27 ENCOUNTER — Other Ambulatory Visit: Payer: Self-pay | Admitting: Cardiology

## 2019-10-08 ENCOUNTER — Emergency Department (HOSPITAL_COMMUNITY)
Admission: EM | Admit: 2019-10-08 | Discharge: 2019-10-08 | Disposition: A | Discharge status: Home / Self Care | Payer: Medicare Other | Attending: Emergency Medicine | Admitting: Emergency Medicine

## 2019-10-08 ENCOUNTER — Other Ambulatory Visit: Payer: Self-pay

## 2019-10-08 ENCOUNTER — Encounter (HOSPITAL_COMMUNITY): Payer: Self-pay | Admitting: *Deleted

## 2019-10-08 DIAGNOSIS — Y999 Unspecified external cause status: Secondary | ICD-10-CM | POA: Insufficient documentation

## 2019-10-08 DIAGNOSIS — I130 Hypertensive heart and chronic kidney disease with heart failure and stage 1 through stage 4 chronic kidney disease, or unspecified chronic kidney disease: Secondary | ICD-10-CM | POA: Diagnosis not present

## 2019-10-08 DIAGNOSIS — S40022A Contusion of left upper arm, initial encounter: Secondary | ICD-10-CM | POA: Insufficient documentation

## 2019-10-08 DIAGNOSIS — J449 Chronic obstructive pulmonary disease, unspecified: Secondary | ICD-10-CM | POA: Insufficient documentation

## 2019-10-08 DIAGNOSIS — Z951 Presence of aortocoronary bypass graft: Secondary | ICD-10-CM | POA: Diagnosis not present

## 2019-10-08 DIAGNOSIS — F17210 Nicotine dependence, cigarettes, uncomplicated: Secondary | ICD-10-CM | POA: Insufficient documentation

## 2019-10-08 DIAGNOSIS — I5032 Chronic diastolic (congestive) heart failure: Secondary | ICD-10-CM | POA: Diagnosis not present

## 2019-10-08 DIAGNOSIS — N189 Chronic kidney disease, unspecified: Secondary | ICD-10-CM | POA: Insufficient documentation

## 2019-10-08 DIAGNOSIS — Y939 Activity, unspecified: Secondary | ICD-10-CM | POA: Insufficient documentation

## 2019-10-08 DIAGNOSIS — Y92410 Unspecified street and highway as the place of occurrence of the external cause: Secondary | ICD-10-CM | POA: Diagnosis not present

## 2019-10-08 DIAGNOSIS — S40922A Unspecified superficial injury of left upper arm, initial encounter: Secondary | ICD-10-CM | POA: Diagnosis present
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
## 2019-10-20 ENCOUNTER — Other Ambulatory Visit: Payer: Self-pay | Admitting: Cardiology

## 2019-11-26 ENCOUNTER — Ambulatory Visit: Payer: Medicare Other | Admitting: Cardiology

## 2019-11-26 ENCOUNTER — Encounter: Payer: Self-pay | Admitting: Cardiology

## 2019-12-29 ENCOUNTER — Other Ambulatory Visit: Payer: Self-pay | Admitting: Cardiology

## 2020-01-18 ENCOUNTER — Other Ambulatory Visit (INDEPENDENT_AMBULATORY_CARE_PROVIDER_SITE_OTHER): Payer: Self-pay | Admitting: Gastroenterology

## 2020-01-18 NOTE — Telephone Encounter (Signed)
Last seen 12/09/2018 for Colon ca screening by Merrilyn Puma

## 2020-03-20 ENCOUNTER — Other Ambulatory Visit: Payer: Self-pay | Admitting: Cardiology

## 2020-03-22 ENCOUNTER — Other Ambulatory Visit: Payer: Self-pay

## 2020-03-22 ENCOUNTER — Ambulatory Visit (INDEPENDENT_AMBULATORY_CARE_PROVIDER_SITE_OTHER): Payer: Medicare Other | Admitting: Cardiology

## 2020-03-22 VITALS — BP 116/46 | HR 55 | Resp 16 | Ht 62.0 in | Wt 164.6 lb

## 2020-03-22 DIAGNOSIS — I10 Essential (primary) hypertension: Secondary | ICD-10-CM | POA: Diagnosis not present

## 2020-03-22 DIAGNOSIS — E782 Mixed hyperlipidemia: Secondary | ICD-10-CM | POA: Diagnosis not present

## 2020-03-22 DIAGNOSIS — I350 Nonrheumatic aortic (valve) stenosis: Secondary | ICD-10-CM | POA: Diagnosis not present

## 2020-03-22 DIAGNOSIS — I2510 Atherosclerotic heart disease of native coronary artery without angina pectoris: Secondary | ICD-10-CM | POA: Diagnosis not present

## 2020-03-22 NOTE — Patient Instructions (Signed)
Medication Instructions:  Your physician recommends that you continue on your current medications as directed. Please refer to the Current Medication list given to you today.  *If you need a refill on your cardiac medications before your next appointment, please call your pharmacy*   Lab Work: Your physician recommends that you return for lab work in: Fasting   If you have labs (blood work) drawn today and your tests are completely normal, you will receive your results only by: . MyChart Message (if you have MyChart) OR . A paper copy in the mail If you have any lab test that is abnormal or we need to change your treatment, we will call you to review the results.   Testing/Procedures: Your physician has requested that you have an echocardiogram. Echocardiography is a painless test that uses sound waves to create images of your heart. It provides your doctor with information about the size and shape of your heart and how well your heart's chambers and valves are working. This procedure takes approximately one hour. There are no restrictions for this procedure.     Follow-Up: At [facility], you and your health needs are our priority.  As part of our continuing mission to provide you with exceptional heart care, we have created designated Provider Care Teams.  These Care Teams include your primary Cardiologist (physician) and Advanced Practice Providers (APPs -  Physician Assistants and Nurse Practitioners) who all work together to provide you with the care you need, when you need it.  We recommend signing up for the patient portal called "MyChart".  Sign up information is provided on this After Visit Summary.  MyChart is used to connect with patients for Virtual Visits (Telemedicine).  Patients are able to view lab/test results, encounter notes, upcoming appointments, etc.  Non-urgent messages can be sent to your provider as well.   To learn more about what you can do with MyChart, go to  [email or web address].    Your next appointment:   6 month(s)  The format for your next appointment:   In Person  Provider:   Jonathan Branch, MD   Other Instructions Thank you for choosing [facility]!    

## 2020-03-22 NOTE — Progress Notes (Signed)
Clinical Summary Christina Boyle is a 65 y.o.female seen today for follow up of the following medical problems.    1. CAD  - prior CABG in 2007 3 vessel (LIMA to LAD, SVG to OM, SVG to PDA) - Echo 12/2012 LVEF 60-65%  - 01/2014 exercise MPI: did not reach THR, exercise limited by SOB and chest pain. Converted to [employer]. Apical anterior and anterolateral ischemia, intermediate risk. LVEF 62% - 06/2015 cath received stent to SVG-OM suboptimal result. If continued symptoms consider PCI ofRCA 06/2015 echo: LVEF 65-70%, no WMAs, grade I diastolic dysfunction, mild to moderate AS - she is on indefinite DAPT   - no recent chest pain, no SOB/DOe - compliant with meds  2. HTN - compliant with meds   3. Hyperlipidemia - compliant with crestor and zetia - has not had recent labs  06/2019 TC 145 HDL 49 LDL 76 TG 118  4.Aortic stenosis 12/2016 echo mild to moderateAS -denies any symptoms   5. Carotid stenosis - carotid US 01/2015 with bilateral 1-39% disease.  6. Hypokalemia - related to diureticsin the past  7. LE edema - no recent issues. Taking the lasix daily 20mg    SH: enjoys spending time outside, planting flowers.  Has had covid x 2.    Past Medical History:  Diagnosis Date  . Acute posthemorrhagic anemia   . Atherosclerotic heart disease of native coronary artery without angina pectoris   . Bleeding ulcer 06/2015  . CAD (coronary artery disease)    Multivessel disease s/p CABG, DES SVG to OM  July 03 2015 (occluded SVG to PDA)  . Cervical cancer ([place])   . Chronic bronchitis   . Chronic diastolic (congestive) heart failure ([place])   . Chronic obstructive pulmonary disease, unspecified ([place])   . Coarctation of aorta   . Constipation, unspecified   . COPD (chronic obstructive pulmonary disease) ([place])   . Depression   . Essential (primary) hypertension   . Essential hypertension   . Gastrointestinal hemorrhage, unspecified   . GERD  (gastroesophageal reflux disease)   . Heart disease   . Heart failure, unspecified ([place])   . Heart murmur   . Hyperlipidemia   . Hyperlipidemia, unspecified   . Hypertensive chronic kidney disease with stage 1 through stage 4 chronic kidney disease, or unspecified chronic kidney disease   . Hypokalemia   . Hypokalemia   . Hypomagnesemia   . Malignant neoplasm of cervix uteri, unspecified ([place])   . Nicotine dependence, unspecified, uncomplicated   . NSTEMI (non-ST elevated myocardial infarction) [facility])    April 2017  . NSTEMI (non-ST elevated myocardial infarction) ([place])   . Obesity (BMI 30-39.9)   . Other nonrheumatic aortic valve disorders      No Known Allergies   Current Outpatient Medications  Medication Sig Dispense Refill  . albuterol (PROVENTIL HFA;VENTOLIN HFA) 108 (90 Base) MCG/ACT inhaler Inhale 1-2 puffs into the lungs every 6 (six) hours as needed for wheezing or shortness of breath. 1 Inhaler 0  . amLODipine (NORVASC) 10 MG tablet Take 1 tablet by mouth once daily 90 tablet 0  . aspirin EC 81 MG EC tablet Take 1 tablet (81 mg total) by mouth daily.    . clopidogrel (PLAVIX) 75 MG tablet Take 1 tablet by mouth once daily with breakfast 90 tablet 0  . ezetimibe (ZETIA) 10 MG tablet Take 1 tablet by mouth once daily 90 tablet 0  . furosemide (LASIX) 20 MG tablet Take 1 tablet by mouth once  daily 90 tablet 1  . hydrALAZINE (APRESOLINE) 100 MG tablet TAKE 1 TABLET BY MOUTH THREE TIMES DAILY 270 tablet 3  . isosorbide mononitrate (IMDUR) 120 MG 24 hr tablet TAKE 1 TABLET BY MOUTH ONCE DAILY . APPOINTMENT REQUIRED FOR FUTURE REFILLS 90 tablet 0  . losartan (COZAAR) 25 MG tablet Take 1 tablet by mouth once daily 90 tablet 3  . metoprolol tartrate (LOPRESSOR) 50 MG tablet TAKE 1 & 1/2 (ONE & ONE-HALF) TABLETS BY MOUTH TWICE DAILY 270 tablet 0  . nitroGLYCERIN (NITROSTAT) 0.4 MG SL tablet Place 1 tablet (0.4 mg total) every 5 (five) minutes as needed under the tongue for chest  pain. 25 tablet 3  . pantoprazole (PROTONIX) 40 MG tablet TAKE 1 TABLET BY MOUTH ONCE DAILY BEFORE BREAKFAST 90 tablet 0  . rosuvastatin (CRESTOR) 40 MG tablet Take 1 tablet by mouth once daily 90 tablet 3   No current facility-administered medications for this visit.     Past Surgical History:  Procedure Laterality Date  . BREAST BIOPSY Left    Benign  . CARDIAC CATHETERIZATION N/A 06/30/2015   Procedure: Left Heart Cath and Cors/Grafts Angiography;  Surgeon: Leonie Man, MD;  Location: [place] CV LAB;  Service: Cardiovascular;  Laterality: N/A;  . CARDIAC CATHETERIZATION N/A 07/03/2015   Procedure: Coronary Stent Intervention;  Surgeon: Peter M Martinique, MD;  Location: [place] CV LAB;  Service: Cardiovascular;  Laterality: N/A;  . CERVICAL CONE BIOPSY    . CHOLECYSTECTOMY    . CORONARY ARTERY BYPASS GRAFT  02/2006   LIMA to LAD, SVG to OM, SVG to PDA  . ESOPHAGOGASTRODUODENOSCOPY N/A 07/22/2014   Procedure: ESOPHAGOGASTRODUODENOSCOPY (EGD);  Surgeon: [name], MD;  Location: [facility] ENDOSCOPY;  Service: Endoscopy;  Laterality: N/A;  . ESOPHAGOGASTRODUODENOSCOPY N/A 07/12/2015   Procedure: ESOPHAGOGASTRODUODENOSCOPY (EGD);  Surgeon: Rogene Houston, MD;  [facility];  Service: Endoscopy;  Laterality: N/A;  . ESOPHAGOGASTRODUODENOSCOPY N/A 10/18/2015   Procedure: ESOPHAGOGASTRODUODENOSCOPY (EGD);  Surgeon: Rogene Houston, MD;  [facility];  Service: Endoscopy;  Laterality: N/A;  2:55     No Known Allergies    Family History  Problem Relation Age of Onset  . Heart attack Father 32  . Lung cancer Mother 53     Social History Christina Boyle reports that she has been smoking cigarettes. She started smoking about 50 years ago. She has a 50.00 pack-year smoking history. She has never used smokeless tobacco. Christina Boyle reports no history of alcohol use.   Review of Systems CONSTITUTIONAL: No weight loss, fever, chills, weakness or fatigue.  HEENT:  Eyes: No visual loss, blurred vision, double vision or yellow sclerae.No hearing loss, sneezing, congestion, runny nose or sore throat.  SKIN: No rash or itching.  CARDIOVASCULAR: per hpi RESPIRATORY: No shortness of breath, cough or sputum.  GASTROINTESTINAL: No anorexia, nausea, vomiting or diarrhea. No abdominal pain or blood.  GENITOURINARY: No burning on urination, no polyuria NEUROLOGICAL: No headache, dizziness, syncope, paralysis, ataxia, numbness or tingling in the extremities. No change in bowel or bladder control.  MUSCULOSKELETAL: No muscle, back pain, joint pain or stiffness.  LYMPHATICS: No enlarged nodes. No history of splenectomy.  PSYCHIATRIC: No history of depression or anxiety.  ENDOCRINOLOGIC: No reports of sweating, cold or heat intolerance. No polyuria or polydipsia.  Marland Kitchen   Physical Examination Vitals:   03/22/20 1307  BP: (!) 116/46  Pulse: (!) 55  Resp: 16  SpO2: 98%   Filed Weights  03/22/20 1307  Weight: 164 lb 9.6 oz (74.7 kg)    Gen: resting comfortably, no acute distress HEENT: no scleral icterus, pupils equal round and reactive, no palptable cervical adenopathy,  CV: RRR, 3/6 systolic murmur rusb, no jvd Resp: Clear to auscultation bilaterally GI: abdomen is soft, non-tender, non-distended, normal bowel sounds, no hepatosplenomegaly MSK: extremities are warm, no edema.  Skin: warm, no rash Neuro:  no focal deficits Psych: appropriate affect    Assessment and Plan   1. CAD  - . Has been committed to indefinite DAPT -no symptoms, continue current meds - EKG shows SR, no acute ischemic changes  2. HTN -she is at goal, continue current meds  3. Hyperlipidemia -continue statin  4. Aortic stenosis -mild to moderate by last echo --we will repeat echo for surveillance  F/u 6 months     [name], M.D.

## 2020-03-29 ENCOUNTER — Ambulatory Visit (HOSPITAL_COMMUNITY): Payer: Medicare Other

## 2020-04-03 ENCOUNTER — Other Ambulatory Visit (INDEPENDENT_AMBULATORY_CARE_PROVIDER_SITE_OTHER): Payer: Self-pay | Admitting: Internal Medicine

## 2020-04-03 ENCOUNTER — Other Ambulatory Visit: Payer: Self-pay | Admitting: Cardiology

## 2020-04-03 MED ORDER — PANTOPRAZOLE SODIUM 40 MG PO TBEC
40.0000 mg | DELAYED_RELEASE_TABLET | Freq: Every day | ORAL | 1 refills | Status: DC
Start: 2020-04-03 — End: 2020-07-13

## 2020-04-03 NOTE — Telephone Encounter (Signed)
Prescription for Pantoprazole filled. Will need OV prior to next refill due in 6 months

## 2020-04-06 ENCOUNTER — Ambulatory Visit (HOSPITAL_COMMUNITY)
Admission: RE | Admit: 2020-04-06 | Discharge: 2020-04-06 | Disposition: A | Discharge status: Home / Self Care | Payer: Medicare Other | Source: Ambulatory Visit | Attending: Cardiology | Admitting: Cardiology

## 2020-04-06 ENCOUNTER — Other Ambulatory Visit: Payer: Self-pay

## 2020-04-06 DIAGNOSIS — R7301 Impaired fasting glucose: Secondary | ICD-10-CM | POA: Diagnosis not present

## 2020-04-06 DIAGNOSIS — I2510 Atherosclerotic heart disease of native coronary artery without angina pectoris: Secondary | ICD-10-CM | POA: Diagnosis not present

## 2020-04-06 DIAGNOSIS — I350 Nonrheumatic aortic (valve) stenosis: Secondary | ICD-10-CM

## 2020-04-06 DIAGNOSIS — E782 Mixed hyperlipidemia: Secondary | ICD-10-CM | POA: Diagnosis not present

## 2020-04-06 DIAGNOSIS — I10 Essential (primary) hypertension: Secondary | ICD-10-CM | POA: Diagnosis not present

## 2020-04-06 LAB — ECHOCARDIOGRAM COMPLETE
AR max vel: 1.58 cm2
AV Area VTI: 1.63 cm2
AV Area mean vel: 1.57 cm2
AV Mean grad: 11.2 mmHg
AV Peak grad: 18.8 mmHg
Ao pk vel: 2.17 m/s
Area-P 1/2: 3.19 cm2
S' Lateral: 2.07 cm

## 2020-04-06 NOTE — Progress Notes (Addendum)
*  PRELIMINARY RESULTS* Echocardiogram 2D Echocardiogram has been performed by Leavy Cella, RDCS.  Samuel Germany 04/06/2020, 2:01 PM

## 2020-04-07 LAB — COMPREHENSIVE METABOLIC PANEL
AG Ratio: 2.1 (calc) (ref 1.0–2.5)
ALT: 10 U/L (ref 6–29)
AST: 17 U/L (ref 10–35)
Albumin: 4.2 g/dL (ref 3.6–5.1)
Alkaline phosphatase (APISO): 58 U/L (ref 37–153)
BUN/Creatinine Ratio: 8 (calc) (ref 6–22)
BUN: 14 mg/dL (ref 7–25)
CO2: 24 mmol/L (ref 20–32)
Calcium: 9.6 mg/dL (ref 8.6–10.4)
Chloride: 105 mmol/L (ref 98–110)
Creat: 1.67 mg/dL — ABNORMAL HIGH (ref 0.50–0.99)
Globulin: 2 g/dL (calc) (ref 1.9–3.7)
Glucose, Bld: 97 mg/dL (ref 65–99)
Potassium: 4.7 mmol/L (ref 3.5–5.3)
Sodium: 139 mmol/L (ref 135–146)
Total Bilirubin: 0.5 mg/dL (ref 0.2–1.2)
Total Protein: 6.2 g/dL (ref 6.1–8.1)

## 2020-04-07 LAB — HEMOGLOBIN A1C
Hgb A1c MFr Bld: 5.4 % of total Hgb (ref <5.7)
Mean Plasma Glucose: 108 mg/dL
eAG (mmol/L): 6 mmol/L

## 2020-04-07 LAB — LIPID PANEL
Cholesterol: 134 mg/dL (ref <200)
HDL: 54 mg/dL (ref >=50)
LDL Cholesterol (Calc): 65 mg/dL (calc)
Non-HDL Cholesterol (Calc): 80 mg/dL (calc) (ref <130)
Total CHOL/HDL Ratio: 2.5 (calc) (ref <5.0)
Triglycerides: 72 mg/dL (ref <150)

## 2020-04-07 LAB — MAGNESIUM: Magnesium: 2.6 mg/dL — ABNORMAL HIGH (ref 1.5–2.5)

## 2020-04-07 LAB — CBC
HCT: 41 % (ref 35.0–45.0)
Hemoglobin: 13.4 g/dL (ref 11.7–15.5)
MCH: 30 pg (ref 27.0–33.0)
MCHC: 32.7 g/dL (ref 32.0–36.0)
MCV: 91.7 fL (ref 80.0–100.0)
MPV: 11.3 fL (ref 7.5–12.5)
Platelets: 144 10*3/uL (ref 140–400)
RBC: 4.47 10*6/uL (ref 3.80–5.10)
RDW: 13.8 % (ref 11.0–15.0)
WBC: 7.5 10*3/uL (ref 3.8–10.8)

## 2020-04-07 LAB — TSH: TSH: 3.59 mIU/L (ref 0.40–4.50)

## 2020-04-12 ENCOUNTER — Telehealth: Payer: Self-pay | Admitting: *Deleted

## 2020-04-12 DIAGNOSIS — I10 Essential (primary) hypertension: Secondary | ICD-10-CM

## 2020-04-12 NOTE — Telephone Encounter (Signed)
-----   Message from [name], MD sent at 04/11/2020 12:42 PM EST ----- Labs show some increased stress on the kidneys. Any reason she would be dehydrated (vomiting, diarrhea, poor oral intake?) Has she been taking a lot of NSAIDs (aleve, motrin, ibuprofen etc). May be due to the lasix, if answer now to the above questions would just change her lasix to prn swelling instead of daily, repeat a bmet in 2 weeks   Zandra Abts MD

## 2020-04-12 NOTE — Telephone Encounter (Signed)
Pt denies vomiting, diarrhea, or use of NSAIDS - will decrease lasix to prn and have repeat labs done in 2 weeks

## 2020-04-17 ENCOUNTER — Telehealth: Payer: Self-pay | Admitting: *Deleted

## 2020-04-17 NOTE — Telephone Encounter (Signed)
-----   Message from [name], MD sent at 04/17/2020 11:28 AM EST ----- Echo overall looks good. Normal heart pumping function The aortic valve stiffness/stenosis remains just mild, just something to monitor at this time   Zandra Abts MD

## 2020-04-27 DIAGNOSIS — I10 Essential (primary) hypertension: Secondary | ICD-10-CM | POA: Diagnosis not present

## 2020-04-28 ENCOUNTER — Telehealth: Payer: Self-pay | Admitting: Cardiology

## 2020-04-28 LAB — BASIC METABOLIC PANEL
BUN/Creatinine Ratio: 7 (calc) (ref 6–22)
BUN: 10 mg/dL (ref 7–25)
CO2: 25 mmol/L (ref 20–32)
Calcium: 9.4 mg/dL (ref 8.6–10.4)
Chloride: 105 mmol/L (ref 98–110)
Creat: 1.41 mg/dL — ABNORMAL HIGH (ref 0.50–0.99)
Glucose, Bld: 94 mg/dL (ref 65–139)
Potassium: 4.3 mmol/L (ref 3.5–5.3)
Sodium: 138 mmol/L (ref 135–146)

## 2020-04-28 MED ORDER — FUROSEMIDE 20 MG PO TABS: 20.0000 mg | ORAL_TABLET | Freq: Every day | ORAL | 3 refills | Status: DC

## 2020-04-28 MED ORDER — LOSARTAN POTASSIUM 25 MG PO TABS: 25.0000 mg | ORAL_TABLET | Freq: Every day | ORAL | 3 refills | Status: DC

## 2020-04-28 NOTE — Telephone Encounter (Signed)
Medication refill request approved and sent to [facility].  ?

## 2020-04-28 NOTE — Telephone Encounter (Signed)
    1. Which medications need to be refilled? (please list name of each medication and dose if known)  LASIX  LOSARTAN 25 MG   2. Which pharmacy/location (including street and city if local pharmacy) is medication to be sent to?  [facility]  [place] [place]   3. Do they need a 30 day or 90 day supply?

## 2020-05-08 NOTE — Telephone Encounter (Signed)
LM X 3

## 2020-06-23 ENCOUNTER — Other Ambulatory Visit: Payer: Self-pay | Admitting: Cardiology

## 2020-06-27 ENCOUNTER — Ambulatory Visit (INDEPENDENT_AMBULATORY_CARE_PROVIDER_SITE_OTHER): Payer: Medicare Other | Admitting: Cardiology

## 2020-06-27 ENCOUNTER — Other Ambulatory Visit: Payer: Self-pay

## 2020-06-27 ENCOUNTER — Encounter: Payer: Self-pay | Admitting: Cardiology

## 2020-06-27 VITALS — BP 140/58 | HR 63 | Ht 62.0 in | Wt 169.4 lb

## 2020-06-27 DIAGNOSIS — I10 Essential (primary) hypertension: Secondary | ICD-10-CM

## 2020-06-27 DIAGNOSIS — E782 Mixed hyperlipidemia: Secondary | ICD-10-CM | POA: Diagnosis not present

## 2020-06-27 DIAGNOSIS — I6529 Occlusion and stenosis of unspecified carotid artery: Secondary | ICD-10-CM | POA: Diagnosis not present

## 2020-06-27 DIAGNOSIS — I2510 Atherosclerotic heart disease of native coronary artery without angina pectoris: Secondary | ICD-10-CM

## 2020-06-27 DIAGNOSIS — I350 Nonrheumatic aortic (valve) stenosis: Secondary | ICD-10-CM | POA: Diagnosis not present

## 2020-06-27 NOTE — Progress Notes (Signed)
Clinical Summary Ms. Muchmore is a 65 y.o.female seen today for follow up of the following medical problems.    1. CAD  - prior CABG in 2007 3 vessel (LIMA to LAD, SVG to OM, SVG to PDA) - Echo 12/2012 LVEF 60-65%  - 01/2014 exercise MPI: did not reach THR, exercise limited by SOB and chest pain. Converted to [employer]. Apical anterior and anterolateral ischemia, intermediate risk. LVEF 62% - 06/2015 cath received stent to SVG-OM suboptimal result. If continued symptoms consider PCI ofRCA 06/2015 echo: LVEF 65-70%, no WMAs, grade I diastolic dysfunction, mild to moderate AS - she is on indefinite DAPT   -no chest pain - no SOB/DOE - compliant with meds  2. HTN -she is compliant with meds   3. Hyperlipidemia - compliant with crestor and zetia  06/2019 TC 145 HDL 49 LDL 76 TG 118 Jan 2022 TC 134 HDL 54 TG 72 LDL 65  4.Aortic stenosis 12/2016 echo mild to moderateAS -denies any symptoms  Jan 2022 echo LVEF 60-65%, grade II dd, mild AS mean grad 11  AVA VTI 1.6  5. Carotid stenosis - carotid US 01/2015 with bilateral 1-39% disease. 2018 [place]: mild bilateral disease.  -no neuro symptoms  6. LE edema -no recent issue  - some swelling at times.  - taking lasix just as needed, approx 2 days a week. Had mild uptrend in Cr with daily use of 20mg     SH: enjoys spending time outside, planting flowers.    Past Medical History:  Diagnosis Date  . Acute posthemorrhagic anemia   . Atherosclerotic heart disease of native coronary artery without angina pectoris   . Bleeding ulcer 06/2015  . CAD (coronary artery disease)    Multivessel disease s/p CABG, DES SVG to OM  July 03 2015 (occluded SVG to PDA)  . Cervical cancer ([place])   . Chronic bronchitis   . Chronic diastolic (congestive) heart failure ([place])   . Chronic obstructive pulmonary disease, unspecified ([place])   . Coarctation of aorta   . Constipation, unspecified   . COPD (chronic obstructive  pulmonary disease) ([place])   . Depression   . Essential (primary) hypertension   . Essential hypertension   . Gastrointestinal hemorrhage, unspecified   . GERD (gastroesophageal reflux disease)   . Heart disease   . Heart failure, unspecified ([place])   . Heart murmur   . Hyperlipidemia   . Hyperlipidemia, unspecified   . Hypertensive chronic kidney disease with stage 1 through stage 4 chronic kidney disease, or unspecified chronic kidney disease   . Hypokalemia   . Hypokalemia   . Hypomagnesemia   . Malignant neoplasm of cervix uteri, unspecified ([place])   . Nicotine dependence, unspecified, uncomplicated   . NSTEMI (non-ST elevated myocardial infarction) [facility])    April 2017  . NSTEMI (non-ST elevated myocardial infarction) ([place])   . Obesity (BMI 30-39.9)   . Other nonrheumatic aortic valve disorders      No Known Allergies   Current Outpatient Medications  Medication Sig Dispense Refill  . albuterol (PROVENTIL HFA;VENTOLIN HFA) 108 (90 Base) MCG/ACT inhaler Inhale 1-2 puffs into the lungs every 6 (six) hours as needed for wheezing or shortness of breath. 1 Inhaler 0  . amLODipine (NORVASC) 10 MG tablet Take 1 tablet by mouth once daily 90 tablet 3  . aspirin EC 81 MG EC tablet Take 1 tablet (81 mg total) by mouth daily.    . clopidogrel (PLAVIX) 75 MG tablet Take 1 tablet by  mouth once daily with breakfast 90 tablet 3  . ezetimibe (ZETIA) 10 MG tablet Take 1 tablet by mouth once daily 90 tablet 0  . furosemide (LASIX) 20 MG tablet Take 1 tablet (20 mg total) by mouth daily. 90 tablet 3  . hydrALAZINE (APRESOLINE) 100 MG tablet TAKE 1 TABLET BY MOUTH THREE TIMES DAILY 270 tablet 3  . isosorbide mononitrate (IMDUR) 120 MG 24 hr tablet Take 1 tablet by mouth once daily 90 tablet 3  . losartan (COZAAR) 25 MG tablet Take 1 tablet (25 mg total) by mouth daily. 90 tablet 3  . metoprolol tartrate (LOPRESSOR) 50 MG tablet TAKE 1 & 1/2 (ONE & ONE-HALF) TABLETS BY MOUTH TWICE DAILY 270 tablet  3  . nitroGLYCERIN (NITROSTAT) 0.4 MG SL tablet Place 1 tablet (0.4 mg total) every 5 (five) minutes as needed under the tongue for chest pain. 25 tablet 3  . pantoprazole (PROTONIX) 40 MG tablet Take 1 tablet (40 mg total) by mouth daily before breakfast. 90 tablet 1  . rosuvastatin (CRESTOR) 40 MG tablet Take 1 tablet by mouth once daily 90 tablet 3   No current facility-administered medications for this visit.     Past Surgical History:  Procedure Laterality Date  . BREAST BIOPSY Left    Benign  . CARDIAC CATHETERIZATION N/A 06/30/2015   Procedure: Left Heart Cath and Cors/Grafts Angiography;  Surgeon: Leonie Man, MD;  Location: [place] [facility];  Service: Cardiovascular;  Laterality: N/A;  . CARDIAC CATHETERIZATION N/A 07/03/2015   Procedure: Coronary Stent Intervention;  Surgeon: Peter M Martinique, MD;  Location: [place] [facility];  Service: Cardiovascular;  Laterality: N/A;  . CERVICAL CONE BIOPSY    . CHOLECYSTECTOMY    . CORONARY ARTERY BYPASS GRAFT  02/2006   LIMA to LAD, SVG to OM, SVG to PDA  . ESOPHAGOGASTRODUODENOSCOPY N/A 07/22/2014   Procedure: ESOPHAGOGASTRODUODENOSCOPY (EGD);  Surgeon: [name], MD;  Location: [facility] ENDOSCOPY;  Service: Endoscopy;  Laterality: N/A;  . ESOPHAGOGASTRODUODENOSCOPY N/A 07/12/2015   Procedure: ESOPHAGOGASTRODUODENOSCOPY (EGD);  Surgeon: Rogene Houston, MD;  [facility];  Service: Endoscopy;  Laterality: N/A;  . ESOPHAGOGASTRODUODENOSCOPY N/A 10/18/2015   Procedure: ESOPHAGOGASTRODUODENOSCOPY (EGD);  Surgeon: Rogene Houston, MD;  [facility];  Service: Endoscopy;  Laterality: N/A;  2:55     No Known Allergies    Family History  Problem Relation Age of Onset  . Heart attack Father 83  . Lung cancer Mother 39     Social History Ms. Schnieders reports that she has been smoking cigarettes. She started smoking about 51 years ago. She has a 50.00 pack-year smoking history. She has never used smokeless  tobacco. Ms. Servidio reports no history of alcohol use.   Review of Systems CONSTITUTIONAL: No weight loss, fever, chills, weakness or fatigue.  HEENT: Eyes: No visual loss, blurred vision, double vision or yellow sclerae.No hearing loss, sneezing, congestion, runny nose or sore throat.  SKIN: No rash or itching.  CARDIOVASCULAR: per hpi RESPIRATORY: No shortness of breath, cough or sputum.  GASTROINTESTINAL: No anorexia, nausea, vomiting or diarrhea. No abdominal pain or blood.  GENITOURINARY: No burning on urination, no polyuria NEUROLOGICAL: No headache, dizziness, syncope, paralysis, ataxia, numbness or tingling in the extremities. No change in bowel or bladder control.  MUSCULOSKELETAL: No muscle, back pain, joint pain or stiffness.  LYMPHATICS: No enlarged nodes. No history of splenectomy.  PSYCHIATRIC: No history of depression or anxiety.  ENDOCRINOLOGIC: No reports of sweating, cold  or heat intolerance. No polyuria or polydipsia.  Marland Kitchen   Physical Examination Today's Vitals   06/27/20 1323  BP: (!) 140/58  Pulse: 63  SpO2: 96%  Weight: 169 lb 6.4 oz (76.8 kg)  Height: 5\' 2"  (1.575 m)   Body mass index is 30.98 kg/m.  Gen: resting comfortably, no acute distress HEENT: no scleral icterus, pupils equal round and reactive, no palptable cervical adenopathy,  CV: RRR, 3/6 systoic murmur rusb.  no jvd. +bilatearl carotid bruits Resp: Clear to auscultation bilaterally GI: abdomen is soft, non-tender, non-distended, normal bowel sounds, no hepatosplenomegaly MSK: extremities are warm, no edema.  Skin: warm, no rash Neuro:  no focal deficits Psych: appropriate affect   Diagnostic Studies  Jan 2022 echo IMPRESSIONS    1. Left ventricular ejection fraction, by estimation, is 60 to 65%. The  left ventricle has normal function. The left ventricle has no regional  wall motion abnormalities. There is mild left ventricular hypertrophy.  Left ventricular diastolic parameters   are consistent with Grade II diastolic dysfunction (pseudonormalization).  Elevated left atrial pressure.  2. Right ventricular systolic function is normal. The right ventricular  size is normal. There is normal pulmonary artery systolic pressure.  3. The mitral valve is normal in structure. No evidence of mitral valve  regurgitation. No evidence of mitral stenosis.  4. The aortic valve is tricuspid. Aortic valve regurgitation is not  visualized. Mild aortic valve stenosis.    Assessment and Plan  1. CAD  - . Has been committed to indefinite DAPT -no symptoms, continue current meds  2. HTN -manual recheck at goal 130/65, continue current meds  3. Hyperlipidemia -she is at goal, continue statin  4. Aortic stenosis -mild by last [place], cnotinue to monitor  5. Carotid stenosis -repeat carotid US     [name], M.D.

## 2020-06-27 NOTE — Patient Instructions (Signed)
Medication Instructions:  Your physician recommends that you continue on your current medications as directed. Please refer to the Current Medication list given to you today.  *If you need a refill on your cardiac medications before your next appointment, please call your pharmacy*   Lab Work: None  If you have labs (blood work) drawn today and your tests are completely normal, you will receive your results only by: Marland Kitchen MyChart Message (if you have MyChart) OR . A paper copy in the mail If you have any lab test that is abnormal or we need to change your treatment, we will call you to review the results.   Testing/Procedures: Your physician has requested that you have a carotid duplex. This test is an ultrasound of the carotid arteries in your neck. It looks at blood flow through these arteries that supply the brain with blood. Allow one hour for this exam. There are no restrictions or special instructions.     Follow-Up: At [facility], you and your health needs are our priority.  As part of our continuing mission to provide you with exceptional heart care, we have created designated Provider Care Teams.  These Care Teams include your primary Cardiologist (physician) and Advanced Practice Providers (APPs -  Physician Assistants and Nurse Practitioners) who all work together to provide you with the care you need, when you need it.  We recommend signing up for the patient portal called "MyChart".  Sign up information is provided on this After Visit Summary.  MyChart is used to connect with patients for Virtual Visits (Telemedicine).  Patients are able to view lab/test results, encounter notes, upcoming appointments, etc.  Non-urgent messages can be sent to your provider as well.   To learn more about what you can do with MyChart, go to [email or web address].    Your next appointment:   6 month(s)  The format for your next appointment:   In Person  Provider:   Carlyle Dolly,  MD   Other Instructions

## 2020-07-10 ENCOUNTER — Other Ambulatory Visit: Payer: Self-pay | Admitting: Cardiology

## 2020-07-11 ENCOUNTER — Other Ambulatory Visit: Payer: Self-pay

## 2020-07-11 ENCOUNTER — Ambulatory Visit (HOSPITAL_COMMUNITY)
Admission: RE | Admit: 2020-07-11 | Discharge: 2020-07-11 | Disposition: A | Discharge status: Home / Self Care | Payer: Medicare Other | Source: Ambulatory Visit | Attending: Cardiology | Admitting: Cardiology

## 2020-07-11 DIAGNOSIS — I6529 Occlusion and stenosis of unspecified carotid artery: Secondary | ICD-10-CM | POA: Insufficient documentation

## 2020-07-11 DIAGNOSIS — I6523 Occlusion and stenosis of bilateral carotid arteries: Secondary | ICD-10-CM | POA: Diagnosis not present

## 2020-07-13 ENCOUNTER — Other Ambulatory Visit (INDEPENDENT_AMBULATORY_CARE_PROVIDER_SITE_OTHER): Payer: Self-pay

## 2020-07-13 ENCOUNTER — Other Ambulatory Visit: Payer: Self-pay

## 2020-07-13 ENCOUNTER — Encounter (INDEPENDENT_AMBULATORY_CARE_PROVIDER_SITE_OTHER): Payer: Self-pay | Admitting: Gastroenterology

## 2020-07-13 ENCOUNTER — Telehealth: Payer: Self-pay | Admitting: *Deleted

## 2020-07-13 ENCOUNTER — Encounter (INDEPENDENT_AMBULATORY_CARE_PROVIDER_SITE_OTHER): Payer: Self-pay

## 2020-07-13 ENCOUNTER — Ambulatory Visit (INDEPENDENT_AMBULATORY_CARE_PROVIDER_SITE_OTHER): Payer: Medicare Other | Admitting: Gastroenterology

## 2020-07-13 ENCOUNTER — Telehealth (INDEPENDENT_AMBULATORY_CARE_PROVIDER_SITE_OTHER): Payer: Self-pay

## 2020-07-13 VITALS — BP 160/76 | HR 62 | Temp 98.9°F | Ht 62.0 in | Wt 172.0 lb

## 2020-07-13 DIAGNOSIS — Z800 Family history of malignant neoplasm of digestive organs: Secondary | ICD-10-CM

## 2020-07-13 DIAGNOSIS — K219 Gastro-esophageal reflux disease without esophagitis: Secondary | ICD-10-CM

## 2020-07-13 MED ORDER — PEG 3350-KCL-NA BICARB-NACL 420 G PO SOLR: 4000.0000 mL | ORAL | 0 refills | Status: DC

## 2020-07-13 MED ORDER — PANTOPRAZOLE SODIUM 20 MG PO TBEC: 20.0000 mg | DELAYED_RELEASE_TABLET | Freq: Every day | ORAL | 3 refills | Status: DC

## 2020-07-13 NOTE — Progress Notes (Signed)
Christina Boyle, M.D. Gastroenterology & Hepatology [facility] [address] [place], [place] [address]  Primary Care Physician: Patient, No Pcp Per (Inactive) No address on file  I will communicate my assessment and recommendations to the referring MD via EMR.  Problems: 1. GERD 2. FHx Colon cancer, daughter at age 67  History of Present Illness: Christina Boyle is a 65 y.o. female with past medical history of coronary artery disease status post CABG in the past, hyperlipidemia, hypertension, depression, GERD, COPD, history of cervical cancer, who presents for follow up of GERD.  The patient was last seen on 12/09/2018. At that time, the patient was started on pantoprazole 40 mg every day and she was scheduled for colonoscopy given her family history of colon cancer. She had to cancel her colonoscopy and did not reschedule it.  Currently she is on pantoprazole 40 gm qday. Has been compliant with it. She takes most of the times the medication early in AM and waits a few hours to have breakfast.  States that while taking the medication she has not presented any more episodes of heartburn, dysphagia or odynophagia.  She denies having any complaints. The patient denies having any nausea, vomiting, fever, chills, hematochezia, melena, hematemesis, abdominal distention, abdominal pain, diarrhea, jaundice, pruritus. Patient reports that she cut down on the sweet food at night which helped with losing weight in the past.    Last EGD: 2017 - Normal esophagus. - Z-line regular, 36 cm from the incisors. - Gastritis. - Scar in the gastric antrum. - Normal duodenal bulb and second portion of the duodenum Last Colonoscopy: never  Past Medical History: Past Medical History:  Diagnosis Date  . Acute posthemorrhagic anemia   . Atherosclerotic heart disease of native coronary artery without angina pectoris   . Bleeding ulcer 06/2015  . CAD  (coronary artery disease)    Multivessel disease s/p CABG, DES SVG to OM  July 03 2015 (occluded SVG to PDA)  . Cervical cancer ([place])   . Chronic bronchitis   . Chronic diastolic (congestive) heart failure ([place])   . Chronic obstructive pulmonary disease, unspecified ([place])   . Coarctation of aorta   . Constipation, unspecified   . COPD (chronic obstructive pulmonary disease) ([place])   . Depression   . Essential (primary) hypertension   . Essential hypertension   . Gastrointestinal hemorrhage, unspecified   . GERD (gastroesophageal reflux disease)   . Heart disease   . Heart failure, unspecified ([place])   . Heart murmur   . Hyperlipidemia   . Hyperlipidemia, unspecified   . Hypertensive chronic kidney disease with stage 1 through stage 4 chronic kidney disease, or unspecified chronic kidney disease   . Hypokalemia   . Hypokalemia   . Hypomagnesemia   . Malignant neoplasm of cervix uteri, unspecified ([place])   . Nicotine dependence, unspecified, uncomplicated   . NSTEMI (non-ST elevated myocardial infarction) [facility])    April 2017  . NSTEMI (non-ST elevated myocardial infarction) ([place])   . Obesity (BMI 30-39.9)   . Other nonrheumatic aortic valve disorders     Past Surgical History: Past Surgical History:  Procedure Laterality Date  . BREAST BIOPSY Left    Benign  . CARDIAC CATHETERIZATION N/A 06/30/2015   Procedure: Left Heart Cath and Cors/Grafts Angiography;  Surgeon: Leonie Man, MD;  Location: [place] [facility];  Service: Cardiovascular;  Laterality: N/A;  . CARDIAC CATHETERIZATION N/A 07/03/2015   Procedure: Coronary Stent Intervention;  Surgeon:  Peter M Martinique, MD;  Location: [place] [facility];  Service: Cardiovascular;  Laterality: N/A;  . CERVICAL CONE BIOPSY    . CHOLECYSTECTOMY    . CORONARY ARTERY BYPASS GRAFT  02/2006   LIMA to LAD, SVG to OM, SVG to PDA  . ESOPHAGOGASTRODUODENOSCOPY N/A 07/22/2014   Procedure: ESOPHAGOGASTRODUODENOSCOPY (EGD);  Surgeon: [name], MD;  Location: [facility] ENDOSCOPY;  Service: Endoscopy;  Laterality: N/A;  . ESOPHAGOGASTRODUODENOSCOPY N/A 07/12/2015   Procedure: ESOPHAGOGASTRODUODENOSCOPY (EGD);  Surgeon: Rogene Houston, MD;  [facility];  Service: Endoscopy;  Laterality: N/A;  . ESOPHAGOGASTRODUODENOSCOPY N/A 10/18/2015   Procedure: ESOPHAGOGASTRODUODENOSCOPY (EGD);  Surgeon: Rogene Houston, MD;  [facility];  Service: Endoscopy;  Laterality: N/A;  2:55    Family History: Family History  Problem Relation Age of Onset  . Heart attack Father 34  . Lung cancer Mother 34    Social History: Social History   Tobacco Use  Smoking Status Current Every Day Smoker  . Packs/day: 1.00  . Years: 50.00  . Pack years: 50.00  . Types: Cigarettes  . Start date: 05/21/1969  Smokeless Tobacco Never Used   Social History   Substance and Sexual Activity  Alcohol Use No  . Alcohol/week: 0.0 standard drinks   Social History   Substance and Sexual Activity  Drug Use No    Allergies: No Known Allergies  Medications: Current Outpatient Medications  Medication Sig Dispense Refill  . amLODipine (NORVASC) 10 MG tablet Take 1 tablet by mouth once daily 90 tablet 3  . aspirin EC 81 MG EC tablet Take 1 tablet (81 mg total) by mouth daily.    . clopidogrel (PLAVIX) 75 MG tablet Take 1 tablet by mouth once daily with breakfast 90 tablet 3  . ezetimibe (ZETIA) 10 MG tablet Take 1 tablet by mouth once daily 90 tablet 3  . furosemide (LASIX) 20 MG tablet Take 1 tablet (20 mg total) by mouth daily. 90 tablet 3  . hydrALAZINE (APRESOLINE) 100 MG tablet TAKE 1 TABLET BY MOUTH THREE TIMES DAILY 270 tablet 3  . isosorbide mononitrate (IMDUR) 120 MG 24 hr tablet Take 1 tablet by mouth once daily 90 tablet 3  . losartan (COZAAR) 25 MG tablet Take 1 tablet (25 mg total) by mouth daily. 90 tablet 3  . metoprolol tartrate (LOPRESSOR) 50 MG tablet TAKE 1 & 1/2 (ONE & ONE-HALF) TABLETS BY MOUTH TWICE DAILY 270 tablet  3  . nitroGLYCERIN (NITROSTAT) 0.4 MG SL tablet Place 1 tablet (0.4 mg total) every 5 (five) minutes as needed under the tongue for chest pain. 25 tablet 3  . pantoprazole (PROTONIX) 40 MG tablet Take 1 tablet (40 mg total) by mouth daily before breakfast. 90 tablet 1  . rosuvastatin (CRESTOR) 40 MG tablet Take 1 tablet by mouth once daily 90 tablet 3   No current facility-administered medications for this visit.    Review of Systems: GENERAL: negative for malaise, night sweats HEENT: No changes in hearing or vision, no nose bleeds or other nasal problems. NECK: Negative for lumps, goiter, pain and significant neck swelling RESPIRATORY: Negative for cough, wheezing CARDIOVASCULAR: Negative for chest pain, leg swelling, palpitations, orthopnea GI: SEE HPI MUSCULOSKELETAL: Negative for joint pain or swelling, back pain, and muscle pain. SKIN: Negative for lesions, rash PSYCH: Negative for sleep disturbance, mood disorder and recent psychosocial stressors. HEMATOLOGY Negative for prolonged bleeding, bruising easily, and swollen nodes. ENDOCRINE: Negative for cold or heat intolerance, polyuria, polydipsia and  goiter. NEURO: negative for tremor, gait imbalance, syncope and seizures. The remainder of the review of systems is noncontributory.   Physical Exam: BP (!) 160/76 (BP Location: Left Arm, Patient Position: Sitting, Cuff Size: Large)   Pulse 62   Temp 98.9 F (37.2 C) (Oral)   Ht 5\' 2"  (1.575 m)   Wt 172 lb (78 kg)   BMI 31.46 kg/m  GENERAL: The patient is AO x3, in no acute distress. HEENT: Head is normocephalic and atraumatic. EOMI are intact. Mouth is well hydrated and without lesions. NECK: Supple. No masses LUNGS: Clear to auscultation. No presence of rhonchi/wheezing/rales. Adequate chest expansion HEART: RRR, normal s1 and s2. ABDOMEN: Soft, nontender, no guarding, no peritoneal signs, and nondistended. BS +. No masses. EXTREMITIES: Without any cyanosis, clubbing, rash,  lesions or edema. NEUROLOGIC: AOx3, no focal motor deficit. SKIN: no jaundice, no rashes  Imaging/Labs: as above  I personally reviewed and interpreted the available labs, imaging and endoscopic files.  Impression and Plan: Christina Boyle is a 65 y.o. female with past medical history of coronary artery disease status post CABG in the past, hyperlipidemia, hypertension, depression, GERD, COPD, history of cervical cancer, who presents for follow up of GERD.  The patient has presented adequate control of her GERD while on her current PPI dose.  She has been compliant with the medication.  I discussed with the patient the possibility of decreasing her dose to 20 mg every day which she is agreeable to do.  She will let me know if she has any recurrent episodes of iron, if so we will need to increase the dose back to 40 mg every day indefinitely.  Finally, the patient was counseled about colorectal cancer screening as the patient has never had a colonoscopy in the past and has a family history of colon cancer.  We will schedule for colonoscopy.  - Schedule colonoscopy - Decrease pantoprazole 20 mg qday - Explained presumed etiology of reflux symptoms. Instruction provided in the use of antireflux medication - patient should take medication in the morning 30-45 minutes before eating breakfast. Discussed avoidance of eating within 2 hours of lying down to sleep and benefit of blocks to elevate head of bed.  All questions were answered.      Harvel Quale, MD Gastroenterology and Hepatology [facility]

## 2020-07-13 NOTE — Telephone Encounter (Signed)
LeighAnn Rosezella Kronick, CMA  

## 2020-07-13 NOTE — Telephone Encounter (Signed)
   [facility] Pre-operative Risk Assessment    Patient Name: Christina Boyle  DOB: Mar 21, 1955  MRN: [id number]   HEARTCARE STAFF: - Please ensure there is not already an duplicate clearance open for this procedure. - Under Visit Info/Reason for Call, type in Other and utilize the format Clearance MM/DD/YY or Clearance TBD. Do not use dashes or single digits. - If request is for dental extraction, please clarify the # of teeth to be extracted.  Request for surgical clearance:  1. What type of surgery is being performed? COLONOSCOPY   2. When is this surgery scheduled? 08/01/20   3. What type of clearance is required (medical clearance vs. Pharmacy clearance to hold med vs. Both)? MEDICAL  4. Are there any medications that need to be held prior to surgery and how long? PLAVIX x 5 DAYS PRIOR    5. Practice name and name of physician performing surgery? [facility]; DR. Quillian Quince CASTANEDA   6. What is the office phone number? [phone]   7.   What is the office fax number? [phone]  8.   Anesthesia type (None, local, MAC, general) ? MAC   Julaine Hua 07/13/2020, 2:55 PM  _________________________________________________________________   (provider comments below)

## 2020-07-13 NOTE — Patient Instructions (Signed)
Schedule colonoscopy Decrease pantoprazole 20 mg qday Explained presumed etiology of reflux symptoms. Instruction provided in the use of antireflux medication - patient should take medication in the morning 30-45 minutes before eating breakfast. Discussed avoidance of eating within 2 hours of lying down to sleep and benefit of blocks to elevate head of bed.

## 2020-07-14 NOTE — Telephone Encounter (Signed)
Noted  

## 2020-07-14 NOTE — Telephone Encounter (Signed)
   Name: Christina Boyle  DOB: 11-Nov-1955  MRN: [id number]   Primary Cardiologist: Carlyle Dolly, MD  Chart reviewed as part of pre-operative protocol coverage. Patient was contacted 07/14/2020 in reference to pre-operative risk assessment for pending surgery as outlined below.  Christina Boyle was last seen on 06/27/20 by Dr. Harl Bowie.  Since that day, Christina Boyle has done fine from a cardiac standpoint. She can complete 4 METs without anginal complaints.   Therefore, based on ACC/AHA guidelines, the patient would be at acceptable risk for the planned procedure without further cardiovascular testing.   The patient was advised that if she develops new symptoms prior to surgery to contact our office to arrange for a follow-up visit, and she verbalized understanding.  Per previous recommendations by Dr. Harl Bowie and given lack of interval change in cardiac history since that time, patient can hold plavix 5 days prior to her upcoming colonoscopy with plans to restart when cleared to do so by her gastroenterologist.   I will route this recommendation to the requesting party via Epic fax function and remove from pre-op pool. Please call with questions.  Abigail Butts, PA-C 07/14/2020, 9:22 AM

## 2020-07-19 ENCOUNTER — Telehealth: Payer: Self-pay | Admitting: *Deleted

## 2020-07-19 NOTE — Telephone Encounter (Signed)
-----   Message from [name], MD sent at 07/19/2020  8:49 AM EDT ----- Just mild blockages in the arteries of the neck on both sides, continue to monitor   Zandra Abts MD

## 2020-07-25 ENCOUNTER — Encounter: Payer: Self-pay | Admitting: *Deleted

## 2020-07-25 NOTE — Telephone Encounter (Signed)
Attempted to reach 3 times - letter mailed

## 2020-07-28 ENCOUNTER — Other Ambulatory Visit (HOSPITAL_COMMUNITY)
Admission: RE | Admit: 2020-07-28 | Discharge: 2020-07-28 | Disposition: A | Discharge status: Home / Self Care | Payer: Medicare Other | Source: Ambulatory Visit | Attending: Gastroenterology | Admitting: Gastroenterology

## 2020-07-28 ENCOUNTER — Other Ambulatory Visit: Payer: Self-pay

## 2020-07-28 DIAGNOSIS — Z20822 Contact with and (suspected) exposure to covid-19: Secondary | ICD-10-CM | POA: Insufficient documentation

## 2020-07-28 DIAGNOSIS — Z01812 Encounter for preprocedural laboratory examination: Secondary | ICD-10-CM | POA: Diagnosis not present

## 2020-07-29 LAB — SARS CORONAVIRUS 2 (TAT 6-24 HRS): SARS Coronavirus 2: NEGATIVE

## 2020-08-01 ENCOUNTER — Encounter (INDEPENDENT_AMBULATORY_CARE_PROVIDER_SITE_OTHER): Payer: Self-pay | Admitting: *Deleted

## 2020-08-01 ENCOUNTER — Ambulatory Visit (HOSPITAL_COMMUNITY): Admission: RE | Admit: 2020-08-01 | Payer: Medicare Other | Source: Home / Self Care | Admitting: Gastroenterology

## 2020-08-01 ENCOUNTER — Encounter (HOSPITAL_COMMUNITY): Admission: RE | Payer: Self-pay | Source: Home / Self Care

## 2020-08-01 ENCOUNTER — Telehealth (INDEPENDENT_AMBULATORY_CARE_PROVIDER_SITE_OTHER): Payer: Self-pay

## 2020-08-01 SURGERY — COLONOSCOPY WITH PROPOFOL
Anesthesia: Monitor Anesthesia Care

## 2020-08-01 NOTE — Telephone Encounter (Signed)
I called numerous times trying to reach Christina Boyle on 07/31/20 to cancel her procedure with Dr Jenetta Downer on 08/01/20 and I never got an answer, she never returned my call, she showed up in Endo on 08/01/20 after preping for her procedure, she was sent over to the office to get her procedure rescheduled and she refused to reschedule at this time

## 2020-08-03 ENCOUNTER — Encounter (INDEPENDENT_AMBULATORY_CARE_PROVIDER_SITE_OTHER): Payer: Self-pay | Admitting: *Deleted

## 2020-10-27 DIAGNOSIS — H2513 Age-related nuclear cataract, bilateral: Secondary | ICD-10-CM | POA: Diagnosis not present

## 2020-10-27 DIAGNOSIS — H524 Presbyopia: Secondary | ICD-10-CM | POA: Diagnosis not present

## 2020-12-11 ENCOUNTER — Other Ambulatory Visit: Payer: Self-pay | Admitting: Cardiology

## 2021-01-18 DIAGNOSIS — H01002 Unspecified blepharitis right lower eyelid: Secondary | ICD-10-CM | POA: Diagnosis not present

## 2021-01-18 DIAGNOSIS — H01001 Unspecified blepharitis right upper eyelid: Secondary | ICD-10-CM | POA: Diagnosis not present

## 2021-01-18 DIAGNOSIS — H01004 Unspecified blepharitis left upper eyelid: Secondary | ICD-10-CM | POA: Diagnosis not present

## 2021-01-18 DIAGNOSIS — H2513 Age-related nuclear cataract, bilateral: Secondary | ICD-10-CM | POA: Diagnosis not present

## 2021-03-07 ENCOUNTER — Encounter (HOSPITAL_COMMUNITY): Payer: Self-pay

## 2021-03-07 ENCOUNTER — Other Ambulatory Visit: Payer: Self-pay

## 2021-03-07 ENCOUNTER — Encounter (HOSPITAL_COMMUNITY)
Admission: RE | Admit: 2021-03-07 | Discharge: 2021-03-07 | Disposition: A | Discharge status: Home / Self Care | Payer: Medicare Other | Source: Ambulatory Visit | Attending: Ophthalmology | Admitting: Ophthalmology

## 2021-03-08 DIAGNOSIS — H2512 Age-related nuclear cataract, left eye: Secondary | ICD-10-CM | POA: Diagnosis not present

## 2021-03-14 ENCOUNTER — Telehealth: Payer: Self-pay | Admitting: Cardiology

## 2021-03-14 NOTE — Telephone Encounter (Signed)
Pt c/o medication issue:  1. Name of Medication:    2. How are you currently taking this medication (dosage and times per day)?    3. Are you having a reaction (difficulty breathing--STAT)? no  4. What is your medication issue? Patient calling to see what's her bp and heart medication

## 2021-03-14 NOTE — Telephone Encounter (Signed)
Medications reviewed with patient with specific action for each medication being taken for blood pressure or heart.  Verbalized understanding

## 2021-03-15 NOTE — H&P (Signed)
Surgical History & Physical  Patient Name: Christina Boyle DOB: 1955-03-16  Surgery: Cataract extraction with intraocular lens implant phacoemulsification; Left Eye  Surgeon: Baruch Goldmann MD Surgery Date:  03-16-21 Pre-Op Date:  03-08-21  HPI: A 48 Yr. old female patient Pt referred by Dr. Rosana Hoes for cataract evaluation. The patient complains of nighttime light - car headlights, street lamps etc. glare causing poor vision, which began many years ago. Both eyes are affected. The episode is gradual. The condition's severity is worsening. The complaint is associated with blurry vision and glare. Symptoms are negatively are affecting pt's quality of life. No eye drop use. Pt denies any eye pain or increase in floaters/flashes HPI was performed by Baruch Goldmann .  Medical History: Cataracts Macula Degeneration Glaucoma Retinitis Pigmentosa Heart Problem High Blood Pressure Ulcers  Review of Systems Negative Allergic/Immunologic Negative Cardiovascular Negative Constitutional Negative Ear, Nose, Mouth & Throat Negative Endocrine Negative Eyes Negative Gastrointestinal Negative Genitourinary Negative Hemotologic/Lymphatic Negative Integumentary Negative Musculoskeletal Negative Neurological Negative Psychiatry Negative Respiratory  Social   Current every day smoker   Medication Amlodipine, Aspirin, Clopidogrel, Ezetimibe, Furosemide, Hydralazine, Isosorbide Mononitrate, Losartan, Metoprolol, Nitroglycerin, Pantoprazole, Rosuvastatin,   Sx/Procedures Knee Replacement, Heart Stents,   Drug Allergies   NKDA  History & Physical: Heent: Cataract, Left Eye NECK: supple without bruits LUNGS: lungs clear to auscultation CV: regular rate and rhythm Abdomen: soft and non-tender  Impression & Plan: Assessment: 1.  NUCLEAR SCLEROSIS AGE RELATED; Both Eyes (H25.13) 2.  BLEPHARITIS; Right Upper Lid, Right Lower Lid, Left Upper Lid, Left Lower Lid (H01.001,  H01.002,H01.004,H01.005) 3.  DERMATOCHALASIS; Right Upper Lid, Left Upper Lid (H02.831, B63.845)  Plan: 1.  Cataract accounts for the patient's decreased vision. This visual impairment is not correctable with a tolerable change in glasses or contact lenses. Cataract surgery with an implantation of a new lens should significantly improve the visual and functional status of the patient. Discussed all risks, benefits, alternatives, and potential complications. Discussed the procedures and recovery. Patient desires to have surgery. A-scan ordered and performed today for intra-ocular lens calculations. The surgery will be performed in order to improve vision for driving, reading, and for eye examinations. Recommend phacoemulsification with intra-ocular lens. Recommend Dextenza for post-operative pain and inflammation. Left Eye non-dominant - first. Dilates poorly - shugarcaine by protocol. Malyugin Ring. Omidira.  2.  Recommend regular lid cleaning.  3.  Asymptomatic, recommend observation for now. Findings, prognosis and treatment options reviewed.

## 2021-03-16 ENCOUNTER — Ambulatory Visit (HOSPITAL_COMMUNITY)
Admission: RE | Admit: 2021-03-16 | Discharge: 2021-03-16 | Disposition: A | Discharge status: Home / Self Care | Payer: Medicare Other | Attending: Ophthalmology | Admitting: Ophthalmology

## 2021-03-16 ENCOUNTER — Encounter (HOSPITAL_COMMUNITY)
Admission: RE | Disposition: A | Discharge status: Home / Self Care | Payer: Self-pay | Source: Home / Self Care | Attending: Ophthalmology

## 2021-03-16 ENCOUNTER — Ambulatory Visit (HOSPITAL_COMMUNITY): Payer: Medicare Other | Admitting: Certified Registered"

## 2021-03-16 ENCOUNTER — Encounter (HOSPITAL_COMMUNITY): Payer: Self-pay | Admitting: Ophthalmology

## 2021-03-16 DIAGNOSIS — H2513 Age-related nuclear cataract, bilateral: Secondary | ICD-10-CM | POA: Insufficient documentation

## 2021-03-16 DIAGNOSIS — F17210 Nicotine dependence, cigarettes, uncomplicated: Secondary | ICD-10-CM | POA: Diagnosis not present

## 2021-03-16 DIAGNOSIS — H02831 Dermatochalasis of right upper eyelid: Secondary | ICD-10-CM | POA: Insufficient documentation

## 2021-03-16 DIAGNOSIS — H2512 Age-related nuclear cataract, left eye: Secondary | ICD-10-CM | POA: Diagnosis not present

## 2021-03-16 DIAGNOSIS — I110 Hypertensive heart disease with heart failure: Secondary | ICD-10-CM | POA: Diagnosis not present

## 2021-03-16 DIAGNOSIS — H2181 Floppy iris syndrome: Secondary | ICD-10-CM | POA: Diagnosis not present

## 2021-03-16 DIAGNOSIS — H0100B Unspecified blepharitis left eye, upper and lower eyelids: Secondary | ICD-10-CM | POA: Insufficient documentation

## 2021-03-16 DIAGNOSIS — H02834 Dermatochalasis of left upper eyelid: Secondary | ICD-10-CM | POA: Diagnosis not present

## 2021-03-16 DIAGNOSIS — F17200 Nicotine dependence, unspecified, uncomplicated: Secondary | ICD-10-CM | POA: Insufficient documentation

## 2021-03-16 DIAGNOSIS — I509 Heart failure, unspecified: Secondary | ICD-10-CM | POA: Diagnosis not present

## 2021-03-16 DIAGNOSIS — H0100A Unspecified blepharitis right eye, upper and lower eyelids: Secondary | ICD-10-CM | POA: Insufficient documentation

## 2021-03-16 DIAGNOSIS — J449 Chronic obstructive pulmonary disease, unspecified: Secondary | ICD-10-CM | POA: Diagnosis not present

## 2021-03-16 HISTORY — PX: CATARACT EXTRACTION W/PHACO: SHX586

## 2021-03-16 SURGERY — PHACOEMULSIFICATION, CATARACT, WITH IOL INSERTION
Anesthesia: Monitor Anesthesia Care | Site: Eye | Laterality: Left

## 2021-03-16 MED ORDER — LIDOCAINE HCL (PF) 1 % IJ SOLN
INTRAOCULAR | Status: DC | PRN
  Administered 2021-03-16: 1 mL via OPHTHALMIC

## 2021-03-16 MED ORDER — SODIUM CHLORIDE 0.9% FLUSH
INTRAVENOUS | Status: DC | PRN
Start: 2021-03-16 — End: 2021-03-16
  Administered 2021-03-16: 5 mL via INTRAVENOUS

## 2021-03-16 MED ORDER — MIDAZOLAM HCL 2 MG/2ML IJ SOLN
INTRAMUSCULAR | Status: DC | PRN
  Administered 2021-03-16: 2 mg via INTRAVENOUS

## 2021-03-16 MED ORDER — MIDAZOLAM HCL 2 MG/2ML IJ SOLN
INTRAMUSCULAR | Status: AC
  Filled 2021-03-16: qty 2

## 2021-03-16 MED ORDER — STERILE WATER FOR IRRIGATION IR SOLN
Status: DC | PRN
  Administered 2021-03-16: 250 mL

## 2021-03-16 MED ORDER — TETRACAINE HCL 0.5 % OP SOLN
1.0000 [drp] | OPHTHALMIC | Status: AC | PRN
  Administered 2021-03-16 (×3): 1 [drp] via OPHTHALMIC

## 2021-03-16 MED ORDER — EPINEPHRINE PF 1 MG/ML IJ SOLN
INTRAMUSCULAR | Status: AC
  Filled 2021-03-16: qty 1

## 2021-03-16 MED ORDER — PHENYLEPHRINE-KETOROLAC 1-0.3 % IO SOLN
INTRAOCULAR | Status: AC
  Filled 2021-03-16: qty 4

## 2021-03-16 MED ORDER — SODIUM HYALURONATE 10 MG/ML IO SOLUTION
PREFILLED_SYRINGE | INTRAOCULAR | Status: DC | PRN
  Administered 2021-03-16: 0.85 mL via INTRAOCULAR

## 2021-03-16 MED ORDER — TROPICAMIDE 1 % OP SOLN
1.0000 [drp] | OPHTHALMIC | Status: AC | PRN
  Administered 2021-03-16 (×3): 1 [drp] via OPHTHALMIC
  Filled 2021-03-16: qty 2

## 2021-03-16 MED ORDER — POVIDONE-IODINE 5 % OP SOLN
OPHTHALMIC | Status: DC | PRN
  Administered 2021-03-16: 1 via OPHTHALMIC

## 2021-03-16 MED ORDER — NEOMYCIN-POLYMYXIN-DEXAMETH 3.5-10000-0.1 OP SUSP
OPHTHALMIC | Status: DC | PRN
  Administered 2021-03-16: 1 [drp] via OPHTHALMIC

## 2021-03-16 MED ORDER — BSS IO SOLN
INTRAOCULAR | Status: DC | PRN
  Administered 2021-03-16: 15 mL via INTRAOCULAR

## 2021-03-16 MED ORDER — LIDOCAINE HCL 3.5 % OP GEL
1.0000 "application " | Freq: Once | OPHTHALMIC | Status: AC
  Administered 2021-03-16: 1 via OPHTHALMIC

## 2021-03-16 MED ORDER — PHENYLEPHRINE-KETOROLAC 1-0.3 % IO SOLN
INTRAOCULAR | Status: DC | PRN
  Administered 2021-03-16: 500 mL via OPHTHALMIC

## 2021-03-16 MED ORDER — SODIUM HYALURONATE 23MG/ML IO SOSY
PREFILLED_SYRINGE | INTRAOCULAR | Status: DC | PRN
  Administered 2021-03-16: 0.6 mL via INTRAOCULAR

## 2021-03-16 MED ORDER — PHENYLEPHRINE HCL 2.5 % OP SOLN
1.0000 [drp] | OPHTHALMIC | Status: AC | PRN
  Administered 2021-03-16 (×3): 1 [drp] via OPHTHALMIC

## 2021-03-16 SURGICAL SUPPLY — 15 items
CATARACT SUITE SIGHTPATH (MISCELLANEOUS) ×2 IMPLANT
CLOTH BEACON ORANGE TIMEOUT ST (SAFETY) ×1 IMPLANT
EYE SHIELD UNIVERSAL CLEAR (GAUZE/BANDAGES/DRESSINGS) ×1 IMPLANT
FEE CATARACT SUITE SIGHTPATH (MISCELLANEOUS) IMPLANT
GLOVE SURG UNDER POLY LF SZ7 (GLOVE) ×2 IMPLANT
LENS IOL RAYNER 24.0 (Intraocular Lens) ×2 IMPLANT
LENS IOL RAYONE EMV 24.0 (Intraocular Lens) IMPLANT
NDL HYPO 18GX1.5 BLUNT FILL (NEEDLE) IMPLANT
NEEDLE HYPO 18GX1.5 BLUNT FILL (NEEDLE) ×2 IMPLANT
PAD ARMBOARD 7.5X6 YLW CONV (MISCELLANEOUS) ×1 IMPLANT
RING MALYGIN 7.0 (MISCELLANEOUS) ×1 IMPLANT
SYR TB 1ML LL NO SAFETY (SYRINGE) ×1 IMPLANT
TAPE SURG TRANSPORE 1 IN (GAUZE/BANDAGES/DRESSINGS) IMPLANT
TAPE SURGICAL TRANSPORE 1 IN (GAUZE/BANDAGES/DRESSINGS) ×2
WATER STERILE IRR 250ML POUR (IV SOLUTION) ×1 IMPLANT

## 2021-03-16 NOTE — Anesthesia Preprocedure Evaluation (Signed)
Anesthesia Evaluation  Patient identified by MRN, date of birth, ID band Patient awake    Reviewed: Allergy & Precautions, H&P , NPO status , Patient's Chart, lab work & pertinent test results, reviewed documented beta blocker date and time   Airway Mallampati: II  TM Distance: >3 FB Neck ROM: full    Dental no notable dental hx.    Pulmonary COPD, Current Smoker,    Pulmonary exam normal breath sounds clear to auscultation       Cardiovascular Exercise Tolerance: Good hypertension, + angina + CAD, + Past MI and +CHF  + Valvular Problems/Murmurs AS  Rhythm:regular Rate:Normal     Neuro/Psych PSYCHIATRIC DISORDERS Depression negative neurological ROS     GI/Hepatic Neg liver ROS, PUD, GERD  Medicated,  Endo/Other  negative endocrine ROS  Renal/GU CRFRenal disease  negative genitourinary   Musculoskeletal   Abdominal   Peds  Hematology  (+) Blood dyscrasia, anemia ,   Anesthesia Other Findings 1. Left ventricular ejection fraction, by estimation, is 60 to 65%. The  left ventricle has normal function. The left ventricle has no regional  wall motion abnormalities. There is mild left ventricular hypertrophy.  Left ventricular diastolic parameters  are consistent with Grade II diastolic dysfunction (pseudonormalization).  Elevated left atrial pressure.  2. Right ventricular systolic function is normal. The right ventricular  size is normal. There is normal pulmonary artery systolic pressure.  3. The mitral valve is normal in structure. No evidence of mitral valve  regurgitation. No evidence of mitral stenosis.  4. The aortic valve is tricuspid. Aortic valve regurgitation is not  visualized. Mild aortic valve stenosis.   Reproductive/Obstetrics negative OB ROS                             Anesthesia Physical Anesthesia Plan  ASA: 3  Anesthesia Plan: MAC   Post-op Pain Management:  Minimal or no pain anticipated   Induction:   PONV Risk Score and Plan:   Airway Management Planned:   Additional Equipment:   Intra-op Plan:   Post-operative Plan:   Informed Consent: I have reviewed the patients History and Physical, chart, labs and discussed the procedure including the risks, benefits and alternatives for the proposed anesthesia with the patient or authorized representative who has indicated his/her understanding and acceptance.     Dental Advisory Given  Plan Discussed with: CRNA  Anesthesia Plan Comments:         Anesthesia Quick Evaluation

## 2021-03-16 NOTE — Discharge Instructions (Signed)
Please discharge patient when stable, will follow up today with Dr. Felecia Stanfill at the [facility] [place] office immediately following discharge.  Leave shield in place until visit.  All paperwork with discharge instructions will be given at the office.  [facility] [place] Address:  [address]  [place], [place] [address]  

## 2021-03-16 NOTE — Interval H&P Note (Signed)
History and Physical Interval Note:  03/16/2021 1:42 PM  Christina Boyle  has presented today for surgery, with the diagnosis of nuclear sclerosis age related cataract, left eye.  The various methods of treatment have been discussed with the patient and family. After consideration of risks, benefits and other options for treatment, the patient has consented to  Procedure(s) with comments: CATARACT EXTRACTION PHACO AND INTRAOCULAR LENS PLACEMENT ([place]) (Left) - left as a surgical intervention.  The patient's history has been reviewed, patient examined, no change in status, stable for surgery.  I have reviewed the patient's chart and labs.  Questions were answered to the patient's satisfaction.     Baruch Goldmann

## 2021-03-16 NOTE — Transfer of Care (Signed)
Immediate Anesthesia Transfer of Care Note  Patient: Christina Boyle  Procedure(s) Performed: CATARACT EXTRACTION PHACO AND INTRAOCULAR LENS PLACEMENT ([facility]) (Left: Eye)  Patient Location: PACU  Anesthesia Type:MAC  Level of Consciousness: awake, alert , oriented and patient cooperative  Airway & Oxygen Therapy: Patient Spontanous Breathing  Post-op Assessment: Report given to RN, Post -op Vital signs reviewed and stable and Patient moving all extremities X 4  Post vital signs: Reviewed and stable  Last Vitals:  Vitals Value Taken Time  BP    Temp    Pulse    Resp    SpO2      Last Pain:  Vitals:   03/16/21 1238  TempSrc: Oral  PainSc: 0-No pain      Patients Stated Pain Goal: 7 (89/38/10 1751)  Complications: No notable events documented.

## 2021-03-16 NOTE — Op Note (Signed)
Date of procedure: 03/16/21  Pre-operative diagnosis: Visually significant age-related nuclear cataract, Left Eye; Poor dilation, Left eye (H25.12)   Post-operative diagnosis: Visually significant age-related cataract, Left Eye; Intra-operative Floppy Iris Syndrome, Left Eye (H21.81)  Procedure: Complex removal of cataract via phacoemulsification and insertion of intra-ocular lens Rayner [id number] +24.0D into the capsular bag of the Left Eye (CPT [id number])  Attending surgeon: Gerda Diss. Deanna Wiater, MD, MA  Anesthesia: MAC, Topical Akten  Complications: None  Estimated Blood Loss: <2m (minimal)  Specimens: None  Implants: As above  Indications:  Visually significant cataract, Left Eye  Procedure:  The patient was seen and identified in the pre-operative area. The operative eye was identified and dilated.  The operative eye was marked.  Topical anesthesia was administered to the operative eye.     The patient was then to the operative suite and placed in the supine position.  A timeout was performed confirming the patient, procedure to be performed, and all other relevant information.   The patient's face was prepped and draped in the usual fashion for intra-ocular surgery.  A lid speculum was placed into the operative eye and the surgical microscope moved into place and focused.  Poor dilation of the iris was confirmed.  An inferotemporal paracentesis was created using a 20 gauge paracentesis blade.  Shugarcaine was injected into the anterior chamber.  Viscoelastic was injected into the anterior chamber.  A temporal clear-corneal main wound incision was created using a 2.465mmicrokeratome.  A Malyugin ring was placed.  A continuous curvilinear capsulorrhexis was initiated using an irrigating cystitome and completed using capsulorrhexis forceps.  Hydrodissection and hydrodeliniation were performed.  Viscoelastic was injected into the anterior chamber.  A phacoemulsification handpiece and a chopper as a  second instrument were used to remove the nucleus and epinucleus. The irrigation/aspiration handpiece was used to remove any remaining cortical material.   The capsular bag was reinflated with viscoelastic, checked, and found to be intact.  The intraocular lens was inserted into the capsular bag and dialed into place using a MaSurveyor, mineralsThe Malyugin ring was removed.  The irrigation/aspiration handpiece was used to remove any remaining viscoelastic.  The clear corneal wound and paracentesis wounds were then hydrated and checked with Weck-Cels to be watertight.  The lid-speculum and drape was removed, and the patient's face was cleaned with a wet and dry 4x4.  Maxitrol was instilled in the eye. A clear shield was taped over the eye. The patient was taken to the post-operative care unit in good condition, having tolerated the procedure well.  Post-Op Instructions: [facility] and will receive all other orders and instructions.

## 2021-03-19 NOTE — Anesthesia Postprocedure Evaluation (Signed)
Anesthesia Post Note  Patient: Christina Boyle  Procedure(s) Performed: CATARACT EXTRACTION PHACO AND INTRAOCULAR LENS PLACEMENT ([facility]) (Left: Eye)  Patient location during evaluation: Phase II Anesthesia Type: MAC Level of consciousness: awake Pain management: pain level controlled Vital Signs Assessment: post-procedure vital signs reviewed and stable Respiratory status: spontaneous breathing and respiratory function stable Cardiovascular status: blood pressure returned to baseline and stable Postop Assessment: no headache and no apparent nausea or vomiting Anesthetic complications: no Comments: Late entry   No notable events documented.   Last Vitals:  Vitals:   03/16/21 1238 03/16/21 1407  BP: (!) 159/58 (!) 134/54  Pulse: (!) 56 (!) 57  Resp: 18 16  Temp: 36.8 C 36.4 C  SpO2: 95% 94%    Last Pain:  Vitals:   03/16/21 1407  TempSrc: Oral  PainSc: 0-No pain                 Louann Sjogren

## 2021-03-20 ENCOUNTER — Encounter (HOSPITAL_COMMUNITY): Payer: Self-pay | Admitting: Ophthalmology

## 2021-03-22 ENCOUNTER — Ambulatory Visit: Payer: Medicare Other | Admitting: Cardiology

## 2021-03-22 DIAGNOSIS — H2511 Age-related nuclear cataract, right eye: Secondary | ICD-10-CM | POA: Diagnosis not present

## 2021-03-26 ENCOUNTER — Encounter (HOSPITAL_COMMUNITY)
Admission: RE | Admit: 2021-03-26 | Discharge: 2021-03-26 | Disposition: A | Discharge status: Home / Self Care | Payer: Medicare Other | Source: Ambulatory Visit | Attending: Ophthalmology | Admitting: Ophthalmology

## 2021-03-26 ENCOUNTER — Encounter (HOSPITAL_COMMUNITY): Payer: Self-pay

## 2021-03-26 NOTE — H&P (Signed)
Surgical History & Physical  Patient Name: Christina Boyle DOB: 05-26-1955  Surgery: Cataract extraction with intraocular lens implant phacoemulsification; Right Eye  Surgeon: Baruch Goldmann MD Surgery Date:  03-30-21 Pre-Op Date:  03-22-21  HPI: A 1 Yr. old female patient The patient is returning after cataract surgery. The left eye is affected. Status post cataract surgery, which began 6 days ago: 03/16/21 Since the last visit, the affected area is doing well. The patient's vision is improved. Patient is following medication instructions. Pt taking Pred and Vigamox TID OS and Ilevro qday OS. Pt denies any eye pain or increase in floaters/flashes of light. The patient complains of nighttime light - car headlights, street lamps etc. glare causing poor vision, which began many years ago. The right eye is affected. The episode is gradual. The condition's severity is worsening. The complaint is associated with blurry vision and glare. This is negatively affecting the patient's quality of life and the patient is unable to function adequately in life with the current level of vision. HPI was performed by Baruch Goldmann .  Medical History: Cataracts Macula Degeneration Glaucoma Retinitis Pigmentosa Heart Problem High Blood Pressure Ulcers  Review of Systems Negative Allergic/Immunologic Negative Cardiovascular Negative Constitutional Negative Ear, Nose, Mouth & Throat Negative Endocrine Negative Eyes Negative Gastrointestinal Negative Genitourinary Negative Hemotologic/Lymphatic Negative Integumentary Negative Musculoskeletal Negative Neurological Negative Psychiatry Negative Respiratory  Social   Current every day smoker   Medication Prednisolone-Moxifloxacin-Bromfenac, Ilevro, Vigamox, Amlodipine, Aspirin, Clopidogrel, Ezetimibe, Furosemide, Hydralazine, Isosorbide Mononitrate, Losartan, Metoprolol, Nitroglycerin, Pantoprazole, Rosuvastatin, Prednisolone acetate,    Sx/Procedures Phaco c IOL OS, Knee Replacement, Heart Stents,   Drug Allergies   NKDA  History & Physical: Heent: Cataract, Right Eye NECK: supple without bruits LUNGS: lungs clear to auscultation CV: regular rate and rhythm Abdomen: soft and non-tender  Impression & Plan: Assessment: 1.  CATARACT EXTRACTION STATUS; Left Eye (Z98.42) 2.  NUCLEAR SCLEROSIS AGE RELATED; , Right Eye (H25.11)  Plan: 1.  1 week after cataract surgery. Doing well with improved vision and normal eye pressure. Call with any problems or concerns. Stop Vigamox. Continue Ilevro 1 drop 1x/day for 3 more weeks. Continue Pred Acetate 1 drop 2x/day for 3 more weeks.  2.  Cataract accounts for the patient's decreased vision. This visual impairment is not correctable with a tolerable change in glasses or contact lenses. Cataract surgery with an implantation of a new lens should significantly improve the visual and functional status of the patient. Discussed all risks, benefits, alternatives, and potential complications. Discussed the procedures and recovery. Patient desires to have surgery. A-scan ordered and performed today for intra-ocular lens calculations. The surgery will be performed in order to improve vision for driving, reading, and for eye examinations. Recommend phacoemulsification with intra-ocular lens. Recommend Dextenza for post-operative pain and inflammation. Right Eye. Surgery required to correct imbalance of vision. Dilates poorly - shugarcaine by protocol. Malyugin Ring. Omidira.

## 2021-03-27 ENCOUNTER — Other Ambulatory Visit: Payer: Self-pay | Admitting: Cardiology

## 2021-03-30 ENCOUNTER — Encounter (HOSPITAL_COMMUNITY): Payer: Self-pay | Admitting: Ophthalmology

## 2021-03-30 ENCOUNTER — Other Ambulatory Visit: Payer: Self-pay

## 2021-03-30 ENCOUNTER — Encounter (HOSPITAL_COMMUNITY)
Admission: RE | Disposition: A | Discharge status: Home / Self Care | Payer: Self-pay | Source: Home / Self Care | Attending: Ophthalmology

## 2021-03-30 ENCOUNTER — Ambulatory Visit (HOSPITAL_COMMUNITY)
Admission: RE | Admit: 2021-03-30 | Discharge: 2021-03-30 | Disposition: A | Discharge status: Home / Self Care | Payer: Medicare Other | Attending: Ophthalmology | Admitting: Ophthalmology

## 2021-03-30 ENCOUNTER — Ambulatory Visit (HOSPITAL_COMMUNITY): Payer: Medicare Other | Admitting: Anesthesiology

## 2021-03-30 DIAGNOSIS — Z9842 Cataract extraction status, left eye: Secondary | ICD-10-CM | POA: Insufficient documentation

## 2021-03-30 DIAGNOSIS — H2511 Age-related nuclear cataract, right eye: Secondary | ICD-10-CM | POA: Diagnosis not present

## 2021-03-30 DIAGNOSIS — I110 Hypertensive heart disease with heart failure: Secondary | ICD-10-CM | POA: Insufficient documentation

## 2021-03-30 DIAGNOSIS — I252 Old myocardial infarction: Secondary | ICD-10-CM | POA: Diagnosis not present

## 2021-03-30 DIAGNOSIS — K219 Gastro-esophageal reflux disease without esophagitis: Secondary | ICD-10-CM | POA: Diagnosis not present

## 2021-03-30 DIAGNOSIS — I2510 Atherosclerotic heart disease of native coronary artery without angina pectoris: Secondary | ICD-10-CM | POA: Insufficient documentation

## 2021-03-30 DIAGNOSIS — I509 Heart failure, unspecified: Secondary | ICD-10-CM | POA: Diagnosis not present

## 2021-03-30 DIAGNOSIS — F17210 Nicotine dependence, cigarettes, uncomplicated: Secondary | ICD-10-CM | POA: Diagnosis not present

## 2021-03-30 DIAGNOSIS — F17200 Nicotine dependence, unspecified, uncomplicated: Secondary | ICD-10-CM | POA: Insufficient documentation

## 2021-03-30 DIAGNOSIS — J449 Chronic obstructive pulmonary disease, unspecified: Secondary | ICD-10-CM | POA: Diagnosis not present

## 2021-03-30 HISTORY — PX: CATARACT EXTRACTION W/PHACO: SHX586

## 2021-03-30 SURGERY — PHACOEMULSIFICATION, CATARACT, WITH IOL INSERTION
Anesthesia: Monitor Anesthesia Care | Site: Eye | Laterality: Right

## 2021-03-30 MED ORDER — PHENYLEPHRINE-KETOROLAC 1-0.3 % IO SOLN
INTRAOCULAR | Status: AC
  Filled 2021-03-30: qty 4

## 2021-03-30 MED ORDER — SODIUM CHLORIDE 0.9% FLUSH
INTRAVENOUS | Status: DC | PRN
  Administered 2021-03-30: 3 mL via INTRAVENOUS

## 2021-03-30 MED ORDER — LACTATED RINGERS IV SOLN: INTRAVENOUS | Status: DC

## 2021-03-30 MED ORDER — MIDAZOLAM HCL 2 MG/2ML IJ SOLN
INTRAMUSCULAR | Status: DC | PRN
  Administered 2021-03-30: 2 mg via INTRAVENOUS

## 2021-03-30 MED ORDER — PHENYLEPHRINE HCL 2.5 % OP SOLN
1.0000 [drp] | OPHTHALMIC | Status: AC | PRN
  Administered 2021-03-30 (×3): 1 [drp] via OPHTHALMIC

## 2021-03-30 MED ORDER — SODIUM HYALURONATE 23MG/ML IO SOSY
PREFILLED_SYRINGE | INTRAOCULAR | Status: DC | PRN
  Administered 2021-03-30: 0.6 mL via INTRAOCULAR

## 2021-03-30 MED ORDER — STERILE WATER FOR IRRIGATION IR SOLN
Status: DC | PRN
  Administered 2021-03-30: 250 mL

## 2021-03-30 MED ORDER — BSS IO SOLN
INTRAOCULAR | Status: DC | PRN
  Administered 2021-03-30: 15 mL via INTRAOCULAR

## 2021-03-30 MED ORDER — TROPICAMIDE 1 % OP SOLN
1.0000 [drp] | OPHTHALMIC | Status: AC | PRN
  Administered 2021-03-30 (×3): 1 [drp] via OPHTHALMIC

## 2021-03-30 MED ORDER — LIDOCAINE HCL 3.5 % OP GEL
1.0000 "application " | Freq: Once | OPHTHALMIC | Status: AC
  Administered 2021-03-30: 1 via OPHTHALMIC

## 2021-03-30 MED ORDER — TETRACAINE HCL 0.5 % OP SOLN
1.0000 [drp] | OPHTHALMIC | Status: AC | PRN
  Administered 2021-03-30 (×3): 1 [drp] via OPHTHALMIC

## 2021-03-30 MED ORDER — MIDAZOLAM HCL 2 MG/2ML IJ SOLN
INTRAMUSCULAR | Status: AC
  Filled 2021-03-30: qty 2

## 2021-03-30 MED ORDER — NEOMYCIN-POLYMYXIN-DEXAMETH 3.5-10000-0.1 OP SUSP
OPHTHALMIC | Status: DC | PRN
  Administered 2021-03-30: 1 [drp] via OPHTHALMIC

## 2021-03-30 MED ORDER — SODIUM HYALURONATE 10 MG/ML IO SOLUTION
PREFILLED_SYRINGE | INTRAOCULAR | Status: DC | PRN
  Administered 2021-03-30: 0.85 mL via INTRAOCULAR

## 2021-03-30 MED ORDER — POVIDONE-IODINE 5 % OP SOLN
OPHTHALMIC | Status: DC | PRN
  Administered 2021-03-30: 1 via OPHTHALMIC

## 2021-03-30 MED ORDER — LIDOCAINE HCL (PF) 1 % IJ SOLN
INTRAOCULAR | Status: DC | PRN
  Administered 2021-03-30: 1 mL via OPHTHALMIC

## 2021-03-30 MED ORDER — PHENYLEPHRINE-KETOROLAC 1-0.3 % IO SOLN
INTRAOCULAR | Status: DC | PRN
  Administered 2021-03-30: 500 mL via OPHTHALMIC

## 2021-03-30 SURGICAL SUPPLY — 14 items
CATARACT SUITE SIGHTPATH (MISCELLANEOUS) ×2 IMPLANT
CLOTH BEACON ORANGE TIMEOUT ST (SAFETY) ×1 IMPLANT
EYE SHIELD UNIVERSAL CLEAR (GAUZE/BANDAGES/DRESSINGS) ×1 IMPLANT
FEE CATARACT SUITE SIGHTPATH (MISCELLANEOUS) IMPLANT
GLOVE SURG UNDER POLY LF SZ7 (GLOVE) ×2 IMPLANT
LENS IOL RAYNER 23.5 (Intraocular Lens) ×2 IMPLANT
LENS IOL RAYONE EMV 23.5 (Intraocular Lens) IMPLANT
NDL HYPO 18GX1.5 BLUNT FILL (NEEDLE) IMPLANT
NEEDLE HYPO 18GX1.5 BLUNT FILL (NEEDLE) ×2 IMPLANT
PAD ARMBOARD 7.5X6 YLW CONV (MISCELLANEOUS) ×1 IMPLANT
SYR TB 1ML LL NO SAFETY (SYRINGE) ×1 IMPLANT
TAPE SURG TRANSPORE 1 IN (GAUZE/BANDAGES/DRESSINGS) IMPLANT
TAPE SURGICAL TRANSPORE 1 IN (GAUZE/BANDAGES/DRESSINGS) ×2
WATER STERILE IRR 250ML POUR (IV SOLUTION) ×1 IMPLANT

## 2021-03-30 NOTE — Transfer of Care (Signed)
Immediate Anesthesia Transfer of Care Note  Patient: Christina Boyle  Procedure(s) Performed: CATARACT EXTRACTION PHACO AND INTRAOCULAR LENS PLACEMENT ([facility]) (Right: Eye)  Patient Location: Short Stay  Anesthesia Type:MAC  Level of Consciousness: awake and patient cooperative  Airway & Oxygen Therapy: Patient Spontanous Breathing  Post-op Assessment: Report given to RN and Post -op Vital signs reviewed and stable  Post vital signs: Reviewed and stable  Last Vitals:  Vitals Value Taken Time  BP    Temp    Pulse    Resp    SpO2      Last Pain:  Vitals:   03/30/21 0836  PainSc: 0-No pain         Complications: No notable events documented.

## 2021-03-30 NOTE — Op Note (Signed)
Date of procedure: 03/30/21  Pre-operative diagnosis: Visually significant age-related nuclear cataract, Right Eye (H25.11)  Post-operative diagnosis: Visually significant age-related nuclear cataract, Right Eye  Procedure: Removal of cataract via phacoemulsification and insertion of intra-ocular lens Rayner RAO200E +23.5D into the capsular bag of the Right Eye  Attending surgeon: Gerda Diss. Abdulahi Schor, MD, MA  Anesthesia: MAC, Topical Akten  Complications: None  Estimated Blood Loss: <46m (minimal)  Specimens: None  Implants: As above  Indications:  Visually significant age-related cataract, Right Eye  Procedure:  The patient was seen and identified in the pre-operative area. The operative eye was identified and dilated.  The operative eye was marked.  Topical anesthesia was administered to the operative eye.     The patient was then to the operative suite and placed in the supine position.  A timeout was performed confirming the patient, procedure to be performed, and all other relevant information.   The patient's face was prepped and draped in the usual fashion for intra-ocular surgery.  A lid speculum was placed into the operative eye and the surgical microscope moved into place and focused.  A superotemporal paracentesis was created using a 20 gauge paracentesis blade.  Shugarcaine was injected into the anterior chamber.  Viscoelastic was injected into the anterior chamber.  A temporal clear-corneal main wound incision was created using a 2.477mmicrokeratome.  A continuous curvilinear capsulorrhexis was initiated using an irrigating cystitome and completed using capsulorrhexis forceps.  Hydrodissection and hydrodeliniation were performed.  Viscoelastic was injected into the anterior chamber.  A phacoemulsification handpiece and a chopper as a second instrument were used to remove the nucleus and epinucleus. The irrigation/aspiration handpiece was used to remove any remaining cortical  material.   The capsular bag was reinflated with viscoelastic, checked, and found to be intact.  The intraocular lens was inserted into the capsular bag.  The irrigation/aspiration handpiece was used to remove any remaining viscoelastic.  The clear corneal wound and paracentesis wounds were then hydrated and checked with Weck-Cels to be watertight.  The lid-speculum and drape was removed, and the patient's face was cleaned with a wet and dry 4x4.  Maxitrol was instilled in the eye. A clear shield was taped over the eye. The patient was taken to the post-operative care unit in good condition, having tolerated the procedure well.  Post-Op Instructions: [facility] and will receive all other orders and instructions.

## 2021-03-30 NOTE — Anesthesia Postprocedure Evaluation (Signed)
Anesthesia Post Note  Patient: Christina Boyle  Procedure(s) Performed: CATARACT EXTRACTION PHACO AND INTRAOCULAR LENS PLACEMENT ([facility]) (Right: Eye)  Patient location during evaluation: PACU Anesthesia Type: MAC Level of consciousness: awake and alert Pain management: pain level controlled Vital Signs Assessment: post-procedure vital signs reviewed and stable Respiratory status: spontaneous breathing, nonlabored ventilation, respiratory function stable and patient connected to nasal cannula oxygen Cardiovascular status: stable and blood pressure returned to baseline Postop Assessment: no apparent nausea or vomiting Anesthetic complications: no   No notable events documented.   Last Vitals:  Vitals:   03/30/21 0840 03/30/21 0948  BP: (!) 161/57 (!) 141/57  Pulse: (!) 52 (!) 57  Resp: 20 17  Temp: 36.5 C (!) 36.4 C  SpO2: 96% 92%    Last Pain:  Vitals:   03/30/21 0948  TempSrc: Oral  PainSc:                  Trixie Rude

## 2021-03-30 NOTE — Interval H&P Note (Signed)
History and Physical Interval Note:  03/30/2021 9:20 AM  Christina Boyle  has presented today for surgery, with the diagnosis of nuclear sclerosis age related; right eye.  The various methods of treatment have been discussed with the patient and family. After consideration of risks, benefits and other options for treatment, the patient has consented to  Procedure(s) with comments: CATARACT EXTRACTION PHACO AND INTRAOCULAR LENS PLACEMENT ([facility]) (Right) - right as a surgical intervention.  The patient's history has been reviewed, patient examined, no change in status, stable for surgery.  I have reviewed the patient's chart and labs.  Questions were answered to the patient's satisfaction.     Baruch Goldmann

## 2021-03-30 NOTE — Anesthesia Preprocedure Evaluation (Signed)
Anesthesia Evaluation  Patient identified by MRN, date of birth, ID band Patient awake    Reviewed: Allergy & Precautions, H&P , NPO status , Patient's Chart, lab work & pertinent test results, reviewed documented beta blocker date and time   Airway Mallampati: II  TM Distance: >3 FB Neck ROM: full    Dental no notable dental hx.    Pulmonary COPD, Current Smoker,    Pulmonary exam normal breath sounds clear to auscultation       Cardiovascular Exercise Tolerance: Good hypertension, + angina (hx of, not now) + CAD, + Past MI and +CHF  + Valvular Problems/Murmurs AS  Rhythm:regular Rate:Normal     Neuro/Psych PSYCHIATRIC DISORDERS Depression negative neurological ROS     GI/Hepatic negative GI ROS, Neg liver ROS, GERD  Medicated,  Endo/Other  negative endocrine ROS  Renal/GU CRFRenal disease  negative genitourinary   Musculoskeletal negative musculoskeletal ROS (+)   Abdominal   Peds  Hematology negative hematology ROS (+) anemia ,   Anesthesia Other Findings 1. Left ventricular ejection fraction, by estimation, is 60 to 65%. The  left ventricle has normal function. The left ventricle has no regional  wall motion abnormalities. There is mild left ventricular hypertrophy.  Left ventricular diastolic parameters  are consistent with Grade II diastolic dysfunction (pseudonormalization).  Elevated left atrial pressure.  2. Right ventricular systolic function is normal. The right ventricular  size is normal. There is normal pulmonary artery systolic pressure.  3. The mitral valve is normal in structure. No evidence of mitral valve  regurgitation. No evidence of mitral stenosis.  4. The aortic valve is tricuspid. Aortic valve regurgitation is not  visualized. Mild aortic valve stenosis.   Reproductive/Obstetrics negative OB ROS                             Anesthesia Physical  Anesthesia  Plan  ASA: 3  Anesthesia Plan: MAC   Post-op Pain Management: Minimal or no pain anticipated   Induction:   PONV Risk Score and Plan: 2  Airway Management Planned:   Additional Equipment:   Intra-op Plan:   Post-operative Plan:   Informed Consent: I have reviewed the patients History and Physical, chart, labs and discussed the procedure including the risks, benefits and alternatives for the proposed anesthesia with the patient or authorized representative who has indicated his/her understanding and acceptance.     Dental Advisory Given  Plan Discussed with: CRNA  Anesthesia Plan Comments:         Anesthesia Quick Evaluation

## 2021-03-30 NOTE — Anesthesia Procedure Notes (Signed)
Date/Time: 03/30/2021 9:25 AM Performed by: Vista Deck, CRNA Pre-anesthesia Checklist: Patient identified, Emergency Drugs available, Suction available, Timeout performed and Patient being monitored Patient Re-evaluated:Patient Re-evaluated prior to induction Oxygen Delivery Method: Nasal Cannula

## 2021-03-30 NOTE — Discharge Instructions (Signed)
Please discharge patient when stable, will follow up today with Dr. Lacosta Hargan at the [facility] [place] office immediately following discharge.  Leave shield in place until visit.  All paperwork with discharge instructions will be given at the office.  [facility] [place] Address:  [address]  [place], [place] [address]  

## 2021-04-02 ENCOUNTER — Encounter (HOSPITAL_COMMUNITY): Payer: Self-pay | Admitting: Ophthalmology

## 2021-04-02 ENCOUNTER — Ambulatory Visit (INDEPENDENT_AMBULATORY_CARE_PROVIDER_SITE_OTHER): Payer: Medicare Other | Admitting: Cardiology

## 2021-04-02 ENCOUNTER — Other Ambulatory Visit: Payer: Self-pay

## 2021-04-02 VITALS — BP 134/60 | HR 57 | Ht 62.0 in | Wt 176.4 lb

## 2021-04-02 DIAGNOSIS — E782 Mixed hyperlipidemia: Secondary | ICD-10-CM

## 2021-04-02 DIAGNOSIS — I2510 Atherosclerotic heart disease of native coronary artery without angina pectoris: Secondary | ICD-10-CM | POA: Diagnosis not present

## 2021-04-02 DIAGNOSIS — I6529 Occlusion and stenosis of unspecified carotid artery: Secondary | ICD-10-CM | POA: Diagnosis not present

## 2021-04-02 DIAGNOSIS — I10 Essential (primary) hypertension: Secondary | ICD-10-CM

## 2021-04-02 DIAGNOSIS — I350 Nonrheumatic aortic (valve) stenosis: Secondary | ICD-10-CM

## 2021-04-02 DIAGNOSIS — R739 Hyperglycemia, unspecified: Secondary | ICD-10-CM

## 2021-04-02 NOTE — Progress Notes (Signed)
Clinical Summary [name] is a 66 y.o.female seen today for follow up of the following medical problems.      1. CAD   - prior CABG in 2007 3 vessel (LIMA to LAD, SVG to OM, SVG to PDA)  - Echo 12/2012 LVEF 60-65%   - 01/2014 exercise MPI: did not reach THR, exercise limited by SOB and chest pain. Converted to [employer]. Apical anterior and anterolateral ischemia, intermediate risk. LVEF 62% - 06/2015 cath received stent to SVG-OM suboptimal result. If continued symptoms consider PCI of RCA 06/2015 echo: LVEF 65-70%, no WMAs, grade I diastolic dysfunction, mild to moderate AS - she is on indefinite DAPT      -no chest pains, no SOB/DOE - compliant with meds   2. HTN  - compliant with meds   3. Hyperlipidemia - compliant with crestor and zetia   06/2019 TC 145 HDL 49 LDL 76 TG 118 Jan 2022 TC 134 HDL 54 TG 72 LDL 65  - complaint with meds   4.Aortic stenosis 12/2016 echo mild to moderate AS -denies any symptoms   Jan 2022 echo LVEF 60-65%, grade II dd, mild AS mean grad 11  AVA VTI 1.6   5. Carotid stenosis - carotid US 01/2015 with bilateral 1-39% disease.  2018 [place]: mild bilateral disease.  -no neuro symptoms  07/2020 mild <50% bilateral ICA disease    6. LE edema - no recent issue   - some swelling at times.  - taking lasix just as needed, approx 2 days a week. Had mild uptrend in Cr with daily use of 20mg     -has had icnreased swelling.  - takes lasix 20mg  3 days a week.    SH: enjoys spending time outside, planting flowers.    Past Medical History:  Diagnosis Date   Acute posthemorrhagic anemia    Atherosclerotic heart disease of native coronary artery without angina pectoris    Bleeding ulcer 06/2015   CAD (coronary artery disease)    Multivessel disease s/p CABG, DES SVG to OM  July 03 2015 (occluded SVG to PDA)   Cervical cancer (HCC)    Chronic bronchitis    Chronic diastolic (congestive) heart failure (HCC)    Chronic obstructive pulmonary  disease, unspecified (HCC)    Coarctation of aorta    Constipation, unspecified    COPD (chronic obstructive pulmonary disease) (HCC)    Depression    Essential (primary) hypertension    Essential hypertension    Gastrointestinal hemorrhage, unspecified    GERD (gastroesophageal reflux disease)    Heart disease    Heart failure, unspecified (HCC)    Heart murmur    Hyperlipidemia    Hyperlipidemia, unspecified    Hypertensive chronic kidney disease with stage 1 through stage 4 chronic kidney disease, or unspecified chronic kidney disease    Hypokalemia    Hypokalemia    Hypomagnesemia    Malignant neoplasm of cervix uteri, unspecified (HCC)    Nicotine dependence, unspecified, uncomplicated    NSTEMI (non-ST elevated myocardial infarction) [facility])    April 2017   NSTEMI (non-ST elevated myocardial infarction) ([place])    Obesity (BMI 30-39.9)    Other nonrheumatic aortic valve disorders      No Known Allergies   Current Outpatient Medications  Medication Sig Dispense Refill   acetaminophen (TYLENOL) 325 MG tablet Take 650 mg by mouth every 6 (six) hours as needed (pain).     amLODipine (NORVASC) 10 MG tablet Take 1 tablet by  mouth once daily (Patient taking differently: Take 10 mg by mouth in the morning.) 90 tablet 3   aspirin EC 81 MG EC tablet Take 1 tablet (81 mg total) by mouth daily. (Patient taking differently: Take 81 mg by mouth in the morning.)     clopidogrel (PLAVIX) 75 MG tablet Take 1 tablet by mouth once daily with breakfast (Patient taking differently: Take 75 mg by mouth in the morning.) 90 tablet 3   ezetimibe (ZETIA) 10 MG tablet Take 1 tablet by mouth once daily (Patient taking differently: Take 10 mg by mouth every evening.) 90 tablet 3   furosemide (LASIX) 20 MG tablet Take 1 tablet (20 mg total) by mouth daily. (Patient taking differently: Take 20 mg by mouth 2 (two) times a week. Sundays & Wednesdays) 90 tablet 3   hydrALAZINE (APRESOLINE) 100 MG tablet TAKE  1 TABLET BY MOUTH THREE TIMES DAILY 270 tablet 1   isosorbide mononitrate (IMDUR) 120 MG 24 hr tablet Take 1 tablet by mouth once daily (Patient taking differently: Take 120 mg by mouth in the morning.) 90 tablet 3   losartan (COZAAR) 25 MG tablet Take 1 tablet (25 mg total) by mouth daily. (Patient taking differently: Take 25 mg by mouth in the morning.) 90 tablet 3   metoprolol tartrate (LOPRESSOR) 50 MG tablet TAKE 1 & 1/2 (ONE & ONE-HALF) TABLETS BY MOUTH TWICE DAILY (Patient taking differently: Take 75 mg by mouth 2 (two) times daily.) 270 tablet 3   nitroGLYCERIN (NITROSTAT) 0.4 MG SL tablet Place 1 tablet (0.4 mg total) every 5 (five) minutes as needed under the tongue for chest pain. 25 tablet 3   pantoprazole (PROTONIX) 20 MG tablet Take 1 tablet (20 mg total) by mouth daily. 90 tablet 3   pantoprazole (PROTONIX) 40 MG tablet Take 40 mg by mouth daily before breakfast.     rosuvastatin (CRESTOR) 40 MG tablet Take 1 tablet (40 mg total) by mouth at bedtime. 90 tablet 1   No current facility-administered medications for this visit.     Past Surgical History:  Procedure Laterality Date   BREAST BIOPSY Left    Benign   CARDIAC CATHETERIZATION N/A 06/30/2015   Procedure: Left Heart Cath and Cors/Grafts Angiography;  Surgeon: Leonie Man, MD;  Location: [place] [facility];  Service: Cardiovascular;  Laterality: N/A;   CARDIAC CATHETERIZATION N/A 07/03/2015   Procedure: Coronary Stent Intervention;  Surgeon: Peter M Martinique, MD;  Location: [place] [facility];  Service: Cardiovascular;  Laterality: N/A;   CATARACT EXTRACTION W/PHACO Left 03/16/2021   Procedure: CATARACT EXTRACTION PHACO AND INTRAOCULAR LENS PLACEMENT (IOC);  Surgeon: Baruch Goldmann, MD;  Location: [facility];  Service: Ophthalmology;  Laterality: Left;  CDE: 21.42   CATARACT EXTRACTION W/PHACO Right 03/30/2021   Procedure: CATARACT EXTRACTION PHACO AND INTRAOCULAR LENS PLACEMENT (IOC);  Surgeon: Baruch Goldmann, MD;  Location: [facility];  Service: Ophthalmology;  Laterality: Right;  CDE: 25.28   CERVICAL CONE BIOPSY     CHOLECYSTECTOMY     CORONARY ARTERY BYPASS GRAFT  02/2006   LIMA to LAD, SVG to OM, SVG to PDA   ESOPHAGOGASTRODUODENOSCOPY N/A 07/22/2014   Procedure: ESOPHAGOGASTRODUODENOSCOPY (EGD);  Surgeon: [name], MD;  Location: [facility] ENDOSCOPY;  Service: Endoscopy;  Laterality: N/A;   ESOPHAGOGASTRODUODENOSCOPY N/A 07/12/2015   Procedure: ESOPHAGOGASTRODUODENOSCOPY (EGD);  Surgeon: Rogene Houston, MD;  [facility];  Service: Endoscopy;  Laterality: N/A;   ESOPHAGOGASTRODUODENOSCOPY N/A 10/18/2015   Procedure: ESOPHAGOGASTRODUODENOSCOPY (EGD);  Surgeon:  Rogene Houston, MD;  [facility];  Service: Endoscopy;  Laterality: N/A;  2:55     No Known Allergies    Family History  Problem Relation Age of Onset   Heart attack Father 48   Lung cancer Mother 23     Social History Ms. Mol reports that she has been smoking cigarettes. She started smoking about 51 years ago. She has a 50.00 pack-year smoking history. She has never used smokeless tobacco. Ms. Leicht reports no history of alcohol use.   Review of Systems CONSTITUTIONAL: No weight loss, fever, chills, weakness or fatigue.  HEENT: Eyes: No visual loss, blurred vision, double vision or yellow sclerae.No hearing loss, sneezing, congestion, runny nose or sore throat.  SKIN: No rash or itching.  CARDIOVASCULAR: per hpi RESPIRATORY: No shortness of breath, cough or sputum.  GASTROINTESTINAL: No anorexia, nausea, vomiting or diarrhea. No abdominal pain or blood.  GENITOURINARY: No burning on urination, no polyuria NEUROLOGICAL: No headache, dizziness, syncope, paralysis, ataxia, numbness or tingling in the extremities. No change in bowel or bladder control.  MUSCULOSKELETAL: No muscle, back pain, joint pain or stiffness.  LYMPHATICS: No enlarged nodes. No history of splenectomy.  PSYCHIATRIC: No history of depression or  anxiety.  ENDOCRINOLOGIC: No reports of sweating, cold or heat intolerance. No polyuria or polydipsia.  Marland Kitchen   Physical Examination Today's Vitals   04/02/21 1529  BP: (!) 138/54  Pulse: (!) 57  SpO2: 96%  Weight: 176 lb 6.4 oz (80 kg)  Height: 5\' 2"  (1.575 m)   Body mass index is 32.26 kg/m.  Gen: resting comfortably, no acute distress HEENT: no scleral icterus, pupils equal round and reactive, no palptable cervical adenopathy,  CV: RRR, 3/6 systolic murmur rusb,  no jvd Resp: Clear to auscultation bilaterally GI: abdomen is soft, non-tender, non-distended, normal bowel sounds, no hepatosplenomegaly MSK: extremities are warm, no edema.  Skin: warm, no rash Neuro:  no focal deficits Psych: appropriate affect   Diagnostic Studies Jan 2022 echo IMPRESSIONS     1. Left ventricular ejection fraction, by estimation, is 60 to 65%. The  left ventricle has normal function. The left ventricle has no regional  wall motion abnormalities. There is mild left ventricular hypertrophy.  Left ventricular diastolic parameters  are consistent with Grade II diastolic dysfunction (pseudonormalization).  Elevated left atrial pressure.   2. Right ventricular systolic function is normal. The right ventricular  size is normal. There is normal pulmonary artery systolic pressure.   3. The mitral valve is normal in structure. No evidence of mitral valve  regurgitation. No evidence of mitral stenosis.   4. The aortic valve is tricuspid. Aortic valve regurgitation is not  visualized. Mild aortic valve stenosis.     Assessment and Plan  1. CAD   - . Has been committed to indefinite DAPT  -no recent symptoms, continue current meds - EKG today shows sinus brady rate 57   2. HTN -essentially at goal, continue current meds   3. Hyperlipidemia - continue current meds, repeat labs    4. Aortic stenosis -mild stenosis by recent US, continue to monitor   5. Carotid stenosis -mild carotid  stenosis by recent US, we will continue to monitor      [name], M.D.

## 2021-04-02 NOTE — Patient Instructions (Signed)
Labwork: CBC CMET TSH A1C LIPID  MAG  Follow-Up: Follow up with Dr. Harl Bowie in 6 months  Any Other Special Instructions Will Be Listed Below (If Applicable).  [facility] [address] [place], [address] Dr. Posey Pronto [phone]   If you need a refill on your cardiac medications before your next appointment, please call your pharmacy.

## 2021-04-09 ENCOUNTER — Other Ambulatory Visit: Payer: Self-pay | Admitting: *Deleted

## 2021-04-09 ENCOUNTER — Telehealth: Payer: Self-pay | Admitting: Cardiology

## 2021-04-09 ENCOUNTER — Other Ambulatory Visit: Payer: Self-pay | Admitting: Cardiology

## 2021-04-09 DIAGNOSIS — R739 Hyperglycemia, unspecified: Secondary | ICD-10-CM | POA: Diagnosis not present

## 2021-04-09 DIAGNOSIS — I2510 Atherosclerotic heart disease of native coronary artery without angina pectoris: Secondary | ICD-10-CM

## 2021-04-09 DIAGNOSIS — R002 Palpitations: Secondary | ICD-10-CM | POA: Diagnosis not present

## 2021-04-09 DIAGNOSIS — R7301 Impaired fasting glucose: Secondary | ICD-10-CM | POA: Diagnosis not present

## 2021-04-09 DIAGNOSIS — Z79899 Other long term (current) drug therapy: Secondary | ICD-10-CM | POA: Diagnosis not present

## 2021-04-09 DIAGNOSIS — I10 Essential (primary) hypertension: Secondary | ICD-10-CM

## 2021-04-09 DIAGNOSIS — E782 Mixed hyperlipidemia: Secondary | ICD-10-CM

## 2021-04-09 NOTE — Telephone Encounter (Signed)
Elizabeth with [employer] states the patient went to their office this morning to have lab work, but they do not have any orders. She is requesting to have orders faxed.  Phone #: [phone] Fax#: [phone]

## 2021-04-09 NOTE — Telephone Encounter (Signed)
Orders faxed

## 2021-04-10 LAB — CBC/DIFF AMBIGUOUS DEFAULT
Basophils Absolute: 0.1 10*3/uL (ref 0.0–0.2)
Basos: 1 %
EOS (ABSOLUTE): 0.4 10*3/uL (ref 0.0–0.4)
Eos: 5 %
Hematocrit: 40.7 % (ref 34.0–46.6)
Hemoglobin: 13.4 g/dL (ref 11.1–15.9)
Immature Grans (Abs): 0 10*3/uL (ref 0.0–0.1)
Immature Granulocytes: 0 %
Lymphocytes Absolute: 1.5 10*3/uL (ref 0.7–3.1)
Lymphs: 20 %
MCH: 30.7 pg (ref 26.6–33.0)
MCHC: 32.9 g/dL (ref 31.5–35.7)
MCV: 93 fL (ref 79–97)
Monocytes Absolute: 0.8 10*3/uL (ref 0.1–0.9)
Monocytes: 11 %
Neutrophils Absolute: 4.7 10*3/uL (ref 1.4–7.0)
Neutrophils: 63 %
Platelets: 154 10*3/uL (ref 150–450)
RBC: 4.37 x10E6/uL (ref 3.77–5.28)
RDW: 13.1 % (ref 11.7–15.4)
WBC: 7.4 10*3/uL (ref 3.4–10.8)

## 2021-04-10 LAB — COMPREHENSIVE METABOLIC PANEL
ALT: 11 IU/L (ref 0–32)
AST: 22 IU/L (ref 0–40)
Albumin/Globulin Ratio: 2.2 (ref 1.2–2.2)
Albumin: 4.6 g/dL (ref 3.8–4.8)
Alkaline Phosphatase: 67 IU/L (ref 44–121)
BUN/Creatinine Ratio: 11 — ABNORMAL LOW (ref 12–28)
BUN: 18 mg/dL (ref 8–27)
Bilirubin Total: 0.4 mg/dL (ref 0.0–1.2)
CO2: 25 mmol/L (ref 20–29)
Calcium: 9.8 mg/dL (ref 8.7–10.3)
Chloride: 102 mmol/L (ref 96–106)
Creatinine, Ser: 1.59 mg/dL — ABNORMAL HIGH (ref 0.57–1.00)
Globulin, Total: 2.1 g/dL (ref 1.5–4.5)
Glucose: 104 mg/dL — ABNORMAL HIGH (ref 70–99)
Potassium: 4.4 mmol/L (ref 3.5–5.2)
Sodium: 139 mmol/L (ref 134–144)
Total Protein: 6.7 g/dL (ref 6.0–8.5)
eGFR: 35 mL/min/{1.73_m2} — ABNORMAL LOW (ref >59)

## 2021-04-10 LAB — MAGNESIUM: Magnesium: 2.6 mg/dL — ABNORMAL HIGH (ref 1.6–2.3)

## 2021-04-10 LAB — LIPID PANEL W/O CHOL/HDL RATIO
Cholesterol, Total: 152 mg/dL (ref 100–199)
HDL: 57 mg/dL (ref >39)
LDL Chol Calc (NIH): 80 mg/dL (ref 0–99)
Triglycerides: 79 mg/dL (ref 0–149)
VLDL Cholesterol Cal: 15 mg/dL (ref 5–40)

## 2021-04-10 LAB — SPECIMEN STATUS REPORT

## 2021-04-10 LAB — TSH: TSH: 3.69 u[IU]/mL (ref 0.450–4.500)

## 2021-04-10 LAB — HGB A1C W/O EAG: Hgb A1c MFr Bld: 5.8 % — ABNORMAL HIGH (ref 4.8–5.6)

## 2021-04-19 ENCOUNTER — Other Ambulatory Visit: Payer: Self-pay | Admitting: Cardiology

## 2021-05-21 ENCOUNTER — Telehealth: Payer: Self-pay | Admitting: *Deleted

## 2021-05-21 NOTE — Telephone Encounter (Signed)
Laurine Blazer, LPN  ?05/20/8116 86:77 AM EDT Back to Top  ?  ?Notified, copy to pcp.   ? Laurine Blazer, LPN  ?3/73/6681 59:47 AM EST   ?  ?Left message to return call.  ? Laurine Blazer, LPN  ?0/76/1518  3:43 PM EST   ?  ?Left message to return call.  ? [name], MD  ?04/15/2021  8:20 AM EST   ?  ?Mildly decreased kidney function but within prior range, cholesterol is too high can we refer to lipid clinic. ?  ?Zandra Abts MD  ? ?

## 2021-05-21 NOTE — Telephone Encounter (Signed)
Patient states that she does not want to go to [facility] for [facility] - does not drive out there & really does not have anyone else to drive her.  If she changes her mind, she will let us know.  Encouraged her to work on diet.  ?

## 2021-05-21 NOTE — Telephone Encounter (Signed)
-----   Message from [name], MD sent at 04/15/2021  8:20 AM EST ----- ?Mildly decreased kidney function but within prior range, cholesterol is too high can we refer to lipid clinic. ? ?Zandra Abts MD ?

## 2021-06-20 ENCOUNTER — Other Ambulatory Visit: Payer: Self-pay | Admitting: Cardiology

## 2021-07-04 ENCOUNTER — Other Ambulatory Visit: Payer: Self-pay | Admitting: Cardiology

## 2021-07-11 ENCOUNTER — Other Ambulatory Visit (INDEPENDENT_AMBULATORY_CARE_PROVIDER_SITE_OTHER): Payer: Self-pay | Admitting: Gastroenterology

## 2021-07-11 DIAGNOSIS — K219 Gastro-esophageal reflux disease without esophagitis: Secondary | ICD-10-CM

## 2021-07-16 ENCOUNTER — Ambulatory Visit (INDEPENDENT_AMBULATORY_CARE_PROVIDER_SITE_OTHER): Payer: Medicare Other | Admitting: Gastroenterology

## 2021-09-18 ENCOUNTER — Other Ambulatory Visit: Payer: Self-pay | Admitting: Cardiology

## 2021-10-01 ENCOUNTER — Ambulatory Visit: Payer: Medicare Other | Admitting: Cardiology

## 2021-10-01 NOTE — Progress Notes (Deleted)
Clinical Summary Christina Boyle is a 66 y.o.female seen today for follow up of the following medical problems.      1. CAD   - prior CABG in 2007 3 vessel (LIMA to LAD, SVG to OM, SVG to PDA)  - Echo 12/2012 LVEF 60-65%   - 01/2014 exercise MPI: did not reach THR, exercise limited by SOB and chest pain. Converted to [employer]. Apical anterior and anterolateral ischemia, intermediate risk. LVEF 62% - 06/2015 cath received stent to SVG-OM suboptimal result. If continued symptoms consider PCI of RCA 06/2015 echo: LVEF 65-70%, no WMAs, grade I diastolic dysfunction, mild to moderate AS - she is on indefinite DAPT      -no chest pains, no SOB/DOE - compliant with meds   2. HTN  - compliant with meds   3. Hyperlipidemia - compliant with crestor and zetia   06/2019 TC 145 HDL 49 LDL 76 TG 118 Jan 2022 TC 134 HDL 54 TG 72 LDL 65   - complaint with meds   4.Aortic stenosis 12/2016 echo mild to moderate AS -denies any symptoms   Jan 2022 echo LVEF 60-65%, grade II dd, mild AS mean grad 11  AVA VTI 1.6   5. Carotid stenosis - carotid US 01/2015 with bilateral 1-39% disease.  2018 [place]: mild bilateral disease.  -no neuro symptoms   07/2020 mild <50% bilateral ICA disease     6. LE edema - no recent issue   - some swelling at times.  - taking lasix just as needed, approx 2 days a week. Had mild uptrend in Cr with daily use of '20mg'$     -has had icnreased swelling.  - takes lasix '20mg'$  3 days a week.    SH: enjoys spending time outside, planting flowers.    Past Medical History:  Diagnosis Date   Acute posthemorrhagic anemia    Atherosclerotic heart disease of native coronary artery without angina pectoris    Bleeding ulcer 06/2015   CAD (coronary artery disease)    Multivessel disease s/p CABG, DES SVG to OM  July 03 2015 (occluded SVG to PDA)   Cervical cancer (HCC)    Chronic bronchitis    Chronic diastolic (congestive) heart failure (HCC)    Chronic obstructive  pulmonary disease, unspecified (HCC)    Coarctation of aorta    Constipation, unspecified    COPD (chronic obstructive pulmonary disease) (HCC)    Depression    Essential (primary) hypertension    Essential hypertension    Gastrointestinal hemorrhage, unspecified    GERD (gastroesophageal reflux disease)    Heart disease    Heart failure, unspecified (HCC)    Heart murmur    Hyperlipidemia    Hyperlipidemia, unspecified    Hypertensive chronic kidney disease with stage 1 through stage 4 chronic kidney disease, or unspecified chronic kidney disease    Hypokalemia    Hypokalemia    Hypomagnesemia    Malignant neoplasm of cervix uteri, unspecified (HCC)    Nicotine dependence, unspecified, uncomplicated    NSTEMI (non-ST elevated myocardial infarction) [facility])    April 2017   NSTEMI (non-ST elevated myocardial infarction) ([place])    Obesity (BMI 30-39.9)    Other nonrheumatic aortic valve disorders      No Known Allergies   Current Outpatient Medications  Medication Sig Dispense Refill   acetaminophen (TYLENOL) 325 MG tablet Take 650 mg by mouth every 6 (six) hours as needed (pain).     amLODipine (NORVASC) 10 MG tablet Take  1 tablet by mouth once daily 90 tablet 1   aspirin EC 81 MG EC tablet Take 1 tablet (81 mg total) by mouth daily. (Patient taking differently: Take 81 mg by mouth in the morning.)     clopidogrel (PLAVIX) 75 MG tablet Take 1 tablet by mouth once daily with breakfast 90 tablet 1   ezetimibe (ZETIA) 10 MG tablet Take 1 tablet by mouth once daily 90 tablet 1   furosemide (LASIX) 20 MG tablet Take 1 tablet by mouth once daily 90 tablet 1   hydrALAZINE (APRESOLINE) 100 MG tablet TAKE 1 TABLET BY MOUTH THREE TIMES DAILY 270 tablet 1   ILEVRO 0.3 % ophthalmic suspension 1 drop daily.     isosorbide mononitrate (IMDUR) 120 MG 24 hr tablet Take 1 tablet by mouth once daily 90 tablet 1   losartan (COZAAR) 25 MG tablet Take 1 tablet by mouth once daily 90 tablet 2    metoprolol tartrate (LOPRESSOR) 50 MG tablet TAKE 1 & 1/2 (ONE & ONE-HALF) TABLETS BY MOUTH TWICE DAILY 270 tablet 1   moxifloxacin (VIGAMOX) 0.5 % ophthalmic solution Apply 1 drop to eye 3 (three) times daily.     nitroGLYCERIN (NITROSTAT) 0.4 MG SL tablet Place 1 tablet (0.4 mg total) every 5 (five) minutes as needed under the tongue for chest pain. 25 tablet 3   pantoprazole (PROTONIX) 20 MG tablet Take 1 tablet by mouth once daily 90 tablet 0   pantoprazole (PROTONIX) 40 MG tablet Take 40 mg by mouth daily before breakfast.     prednisoLONE acetate (PRED FORTE) 1 % ophthalmic suspension 1 drop 3 (three) times daily.     rosuvastatin (CRESTOR) 40 MG tablet TAKE 1 TABLET BY MOUTH AT BEDTIME 90 tablet 0   No current facility-administered medications for this visit.     Past Surgical History:  Procedure Laterality Date   BREAST BIOPSY Left    Benign   CARDIAC CATHETERIZATION N/A 06/30/2015   Procedure: Left Heart Cath and Cors/Grafts Angiography;  Surgeon: Leonie Man, MD;  Location: [place] [facility];  Service: Cardiovascular;  Laterality: N/A;   CARDIAC CATHETERIZATION N/A 07/03/2015   Procedure: Coronary Stent Intervention;  Surgeon: Peter M Martinique, MD;  Location: [place] [facility];  Service: Cardiovascular;  Laterality: N/A;   CATARACT EXTRACTION W/PHACO Left 03/16/2021   Procedure: CATARACT EXTRACTION PHACO AND INTRAOCULAR LENS PLACEMENT (IOC);  Surgeon: Baruch Goldmann, MD;  Location: [facility];  Service: Ophthalmology;  Laterality: Left;  CDE: 21.42   CATARACT EXTRACTION W/PHACO Right 03/30/2021   Procedure: CATARACT EXTRACTION PHACO AND INTRAOCULAR LENS PLACEMENT (IOC);  Surgeon: Baruch Goldmann, MD;  Location: [facility];  Service: Ophthalmology;  Laterality: Right;  CDE: 25.28   CERVICAL CONE BIOPSY     CHOLECYSTECTOMY     CORONARY ARTERY BYPASS GRAFT  02/2006   LIMA to LAD, SVG to OM, SVG to PDA   ESOPHAGOGASTRODUODENOSCOPY N/A 07/22/2014   Procedure: ESOPHAGOGASTRODUODENOSCOPY (EGD);   Surgeon: [name], MD;  Location: [facility] ENDOSCOPY;  Service: Endoscopy;  Laterality: N/A;   ESOPHAGOGASTRODUODENOSCOPY N/A 07/12/2015   Procedure: ESOPHAGOGASTRODUODENOSCOPY (EGD);  Surgeon: Rogene Houston, MD;  [facility];  Service: Endoscopy;  Laterality: N/A;   ESOPHAGOGASTRODUODENOSCOPY N/A 10/18/2015   Procedure: ESOPHAGOGASTRODUODENOSCOPY (EGD);  Surgeon: Rogene Houston, MD;  [facility];  Service: Endoscopy;  Laterality: N/A;  2:55     No Known Allergies    Family History  Problem Relation Age of Onset   Heart attack  Father 60   Lung cancer Mother 75     Social History Christina Boyle reports that she has been smoking cigarettes. She started smoking about 52 years ago. She has a 50.00 pack-year smoking history. She has never used smokeless tobacco. Christina Boyle reports no history of alcohol use.   Review of Systems CONSTITUTIONAL: No weight loss, fever, chills, weakness or fatigue.  HEENT: Eyes: No visual loss, blurred vision, double vision or yellow sclerae.No hearing loss, sneezing, congestion, runny nose or sore throat.  SKIN: No rash or itching.  CARDIOVASCULAR:  RESPIRATORY: No shortness of breath, cough or sputum.  GASTROINTESTINAL: No anorexia, nausea, vomiting or diarrhea. No abdominal pain or blood.  GENITOURINARY: No burning on urination, no polyuria NEUROLOGICAL: No headache, dizziness, syncope, paralysis, ataxia, numbness or tingling in the extremities. No change in bowel or bladder control.  MUSCULOSKELETAL: No muscle, back pain, joint pain or stiffness.  LYMPHATICS: No enlarged nodes. No history of splenectomy.  PSYCHIATRIC: No history of depression or anxiety.  ENDOCRINOLOGIC: No reports of sweating, cold or heat intolerance. No polyuria or polydipsia.  Marland Kitchen   Physical Examination There were no vitals filed for this visit. There were no vitals filed for this visit.  Gen: resting comfortably, no acute distress HEENT: no scleral  icterus, pupils equal round and reactive, no palptable cervical adenopathy,  CV Resp: Clear to auscultation bilaterally GI: abdomen is soft, non-tender, non-distended, normal bowel sounds, no hepatosplenomegaly MSK: extremities are warm, no edema.  Skin: warm, no rash Neuro:  no focal deficits Psych: appropriate affect   Diagnostic Studies Jan 2022 echo IMPRESSIONS     1. Left ventricular ejection fraction, by estimation, is 60 to 65%. The  left ventricle has normal function. The left ventricle has no regional  wall motion abnormalities. There is mild left ventricular hypertrophy.  Left ventricular diastolic parameters  are consistent with Grade II diastolic dysfunction (pseudonormalization).  Elevated left atrial pressure.   2. Right ventricular systolic function is normal. The right ventricular  size is normal. There is normal pulmonary artery systolic pressure.   3. The mitral valve is normal in structure. No evidence of mitral valve  regurgitation. No evidence of mitral stenosis.   4. The aortic valve is tricuspid. Aortic valve regurgitation is not  visualized. Mild aortic valve stenosis.     Assessment and Plan   v     [name], M.D., F.A.C.C.

## 2021-10-11 ENCOUNTER — Other Ambulatory Visit (INDEPENDENT_AMBULATORY_CARE_PROVIDER_SITE_OTHER): Payer: Self-pay | Admitting: Gastroenterology

## 2021-10-11 DIAGNOSIS — K219 Gastro-esophageal reflux disease without esophagitis: Secondary | ICD-10-CM

## 2021-10-20 ENCOUNTER — Other Ambulatory Visit (INDEPENDENT_AMBULATORY_CARE_PROVIDER_SITE_OTHER): Payer: Self-pay | Admitting: Gastroenterology

## 2021-10-20 DIAGNOSIS — K219 Gastro-esophageal reflux disease without esophagitis: Secondary | ICD-10-CM

## 2021-12-11 ENCOUNTER — Other Ambulatory Visit: Payer: Self-pay | Admitting: Cardiology

## 2022-01-07 ENCOUNTER — Other Ambulatory Visit: Payer: Self-pay | Admitting: *Deleted

## 2022-01-07 MED ORDER — ROSUVASTATIN CALCIUM 40 MG PO TABS: 40.0000 mg | ORAL_TABLET | Freq: Every day | ORAL | 0 refills | Status: DC

## 2022-01-07 MED ORDER — EZETIMIBE 10 MG PO TABS: 10.0000 mg | ORAL_TABLET | Freq: Every day | ORAL | 0 refills | Status: DC

## 2022-01-09 ENCOUNTER — Other Ambulatory Visit: Payer: Self-pay | Admitting: Cardiology

## 2022-01-14 ENCOUNTER — Encounter: Payer: Self-pay | Admitting: Cardiology

## 2022-01-14 ENCOUNTER — Ambulatory Visit: Payer: Medicare Other | Attending: Cardiology | Admitting: Cardiology

## 2022-01-14 VITALS — BP 112/68 | HR 61 | Ht 62.0 in | Wt 165.6 lb

## 2022-01-14 DIAGNOSIS — I6523 Occlusion and stenosis of bilateral carotid arteries: Secondary | ICD-10-CM | POA: Insufficient documentation

## 2022-01-14 DIAGNOSIS — I10 Essential (primary) hypertension: Secondary | ICD-10-CM | POA: Insufficient documentation

## 2022-01-14 DIAGNOSIS — I350 Nonrheumatic aortic (valve) stenosis: Secondary | ICD-10-CM | POA: Insufficient documentation

## 2022-01-14 DIAGNOSIS — I2510 Atherosclerotic heart disease of native coronary artery without angina pectoris: Secondary | ICD-10-CM | POA: Diagnosis not present

## 2022-01-14 DIAGNOSIS — E782 Mixed hyperlipidemia: Secondary | ICD-10-CM | POA: Insufficient documentation

## 2022-01-14 NOTE — Patient Instructions (Signed)
Medication Instructions:  Continue all current medications.   Labwork: none  Testing/Procedures: none  Follow-Up: 6 months   Any Other Special Instructions Will Be Listed Below (If Applicable).   If you need a refill on your cardiac medications before your next appointment, please call your pharmacy.  

## 2022-01-14 NOTE — Progress Notes (Signed)
Clinical Summary Christina Boyle is a 66 y.o.female seen today for follow up of the following medical problems.      1. CAD   - prior CABG in 2007 3 vessel (LIMA to LAD, SVG to OM, SVG to PDA)  - Echo 12/2012 LVEF 60-65%   - 01/2014 exercise MPI: did not reach THR, exercise limited by SOB and chest pain. Converted to [employer]. Apical anterior and anterolateral ischemia, intermediate risk. LVEF 62% - 06/2015 cath received stent to SVG-OM suboptimal result. If continued symptoms consider PCI of RCA 06/2015 echo: LVEF 65-70%, no WMAs, grade I diastolic dysfunction, mild to moderate AS - she is on indefinite DAPT      -no chest pains, no SOB/DOE - compliant with meds   2. HTN - she is compliant with meds   3. Hyperlipidemia - compliant with crestor and zetia   06/2019 TC 145 HDL 49 LDL 76 TG 118 Jan 2022 TC 134 HDL 54 TG 72 LDL 65   Jan 2023 TC 152 TG 79 HDL 57 LDL 80 - has not been interested in psck9i    4.Aortic stenosis 12/2016 echo mild to moderate AS -denies any symptoms   Jan 2022 echo LVEF 60-65%, grade II dd, mild AS mean grad 11  AVA VTI 1.6   5. Carotid stenosis - carotid US 01/2015 with bilateral 1-39% disease.  2018 [place]: mild bilateral disease.  -no neuro symptoms   07/2020 mild <50% bilateral ICA disease     6. LE edema - no recent issue   - some swelling at times.  - taking lasix just as needed, approx 2 days a week. Had mild uptrend in Cr with daily use of '20mg'$     -has had icnreased swelling.  - takes lasix '20mg'$  3 days a week.   - no recent edema.    SH: enjoys spending time outside, planting flowers.      Past Medical History:  Diagnosis Date   Acute posthemorrhagic anemia    Atherosclerotic heart disease of native coronary artery without angina pectoris    Bleeding ulcer 06/2015   CAD (coronary artery disease)    Multivessel disease s/p CABG, DES SVG to OM  July 03 2015 (occluded SVG to PDA)   Cervical cancer (HCC)    Chronic bronchitis     Chronic diastolic (congestive) heart failure (HCC)    Chronic obstructive pulmonary disease, unspecified (HCC)    Coarctation of aorta    Constipation, unspecified    COPD (chronic obstructive pulmonary disease) (HCC)    Depression    Essential (primary) hypertension    Essential hypertension    Gastrointestinal hemorrhage, unspecified    GERD (gastroesophageal reflux disease)    Heart disease    Heart failure, unspecified (HCC)    Heart murmur    Hyperlipidemia    Hyperlipidemia, unspecified    Hypertensive chronic kidney disease with stage 1 through stage 4 chronic kidney disease, or unspecified chronic kidney disease    Hypokalemia    Hypokalemia    Hypomagnesemia    Malignant neoplasm of cervix uteri, unspecified (HCC)    Nicotine dependence, unspecified, uncomplicated    NSTEMI (non-ST elevated myocardial infarction) [facility])    April 2017   NSTEMI (non-ST elevated myocardial infarction) ([place])    Obesity (BMI 30-39.9)    Other nonrheumatic aortic valve disorders      No Known Allergies   Current Outpatient Medications  Medication Sig Dispense Refill   acetaminophen (TYLENOL) 325  MG tablet Take 650 mg by mouth every 6 (six) hours as needed (pain).     amLODipine (NORVASC) 10 MG tablet Take 1 tablet by mouth once daily 90 tablet 1   aspirin EC 81 MG EC tablet Take 1 tablet (81 mg total) by mouth daily. (Patient taking differently: Take 81 mg by mouth in the morning.)     clopidogrel (PLAVIX) 75 MG tablet Take 1 tablet by mouth once daily with breakfast 90 tablet 1   ezetimibe (ZETIA) 10 MG tablet Take 1 tablet (10 mg total) by mouth daily. 90 tablet 0   furosemide (LASIX) 20 MG tablet Take 1 tablet by mouth once daily 90 tablet 1   hydrALAZINE (APRESOLINE) 100 MG tablet TAKE 1 TABLET BY MOUTH THREE TIMES DAILY 270 tablet 0   ILEVRO 0.3 % ophthalmic suspension 1 drop daily.     isosorbide mononitrate (IMDUR) 120 MG 24 hr tablet Take 1 tablet by mouth once daily 90 tablet  1   losartan (COZAAR) 25 MG tablet Take 1 tablet by mouth once daily 30 tablet 0   metoprolol tartrate (LOPRESSOR) 50 MG tablet TAKE 1 & 1/2 (ONE & ONE-HALF) TABLETS BY MOUTH TWICE DAILY 270 tablet 1   moxifloxacin (VIGAMOX) 0.5 % ophthalmic solution Apply 1 drop to eye 3 (three) times daily.     nitroGLYCERIN (NITROSTAT) 0.4 MG SL tablet Place 1 tablet (0.4 mg total) every 5 (five) minutes as needed under the tongue for chest pain. 25 tablet 3   pantoprazole (PROTONIX) 20 MG tablet Take 1 tablet by mouth once daily 90 tablet 0   pantoprazole (PROTONIX) 40 MG tablet Take 40 mg by mouth daily before breakfast.     prednisoLONE acetate (PRED FORTE) 1 % ophthalmic suspension 1 drop 3 (three) times daily.     rosuvastatin (CRESTOR) 40 MG tablet Take 1 tablet (40 mg total) by mouth at bedtime. 90 tablet 0   No current facility-administered medications for this visit.     Past Surgical History:  Procedure Laterality Date   BREAST BIOPSY Left    Benign   CARDIAC CATHETERIZATION N/A 06/30/2015   Procedure: Left Heart Cath and Cors/Grafts Angiography;  Surgeon: Leonie Man, MD;  Location: [place] [facility];  Service: Cardiovascular;  Laterality: N/A;   CARDIAC CATHETERIZATION N/A 07/03/2015   Procedure: Coronary Stent Intervention;  Surgeon: Peter M Martinique, MD;  Location: [place] [facility];  Service: Cardiovascular;  Laterality: N/A;   CATARACT EXTRACTION W/PHACO Left 03/16/2021   Procedure: CATARACT EXTRACTION PHACO AND INTRAOCULAR LENS PLACEMENT (IOC);  Surgeon: Baruch Goldmann, MD;  Location: [facility];  Service: Ophthalmology;  Laterality: Left;  CDE: 21.42   CATARACT EXTRACTION W/PHACO Right 03/30/2021   Procedure: CATARACT EXTRACTION PHACO AND INTRAOCULAR LENS PLACEMENT (IOC);  Surgeon: Baruch Goldmann, MD;  Location: [facility];  Service: Ophthalmology;  Laterality: Right;  CDE: 25.28   CERVICAL CONE BIOPSY     CHOLECYSTECTOMY     CORONARY ARTERY BYPASS GRAFT  02/2006   LIMA to LAD, SVG to OM, SVG  to PDA   ESOPHAGOGASTRODUODENOSCOPY N/A 07/22/2014   Procedure: ESOPHAGOGASTRODUODENOSCOPY (EGD);  Surgeon: [name], MD;  Location: [facility] ENDOSCOPY;  Service: Endoscopy;  Laterality: N/A;   ESOPHAGOGASTRODUODENOSCOPY N/A 07/12/2015   Procedure: ESOPHAGOGASTRODUODENOSCOPY (EGD);  Surgeon: Rogene [place], MD;  [facility];  Service: Endoscopy;  Laterality: N/A;   ESOPHAGOGASTRODUODENOSCOPY N/A 10/18/2015   Procedure: ESOPHAGOGASTRODUODENOSCOPY (EGD);  Surgeon: Rogene [place], MD;  [facility];  Service: Endoscopy;  Laterality: N/A;  2:55     No Known Allergies    Family History  Problem Relation Age of Onset   Heart attack Father 71   Lung cancer Mother 65     Social History Ms. Dalporto reports that she has been smoking cigarettes. She started smoking about 52 years ago. She has a 50.00 pack-year smoking history. She has never used smokeless tobacco. Ms. Madej reports no history of alcohol use.   Review of Systems CONSTITUTIONAL: No weight loss, fever, chills, weakness or fatigue.  HEENT: Eyes: No visual loss, blurred vision, double vision or yellow sclerae.No hearing loss, sneezing, congestion, runny nose or sore throat.  SKIN: No rash or itching.  CARDIOVASCULAR: per hpi RESPIRATORY: No shortness of breath, cough or sputum.  GASTROINTESTINAL: No anorexia, nausea, vomiting or diarrhea. No abdominal pain or blood.  GENITOURINARY: No burning on urination, no polyuria NEUROLOGICAL: No headache, dizziness, syncope, paralysis, ataxia, numbness or tingling in the extremities. No change in bowel or bladder control.  MUSCULOSKELETAL: No muscle, back pain, joint pain or stiffness.  LYMPHATICS: No enlarged nodes. No history of splenectomy.  PSYCHIATRIC: No history of depression or anxiety.  ENDOCRINOLOGIC: No reports of sweating, cold or heat intolerance. No polyuria or polydipsia.  Marland Kitchen   Physical Examination Today's Vitals   01/14/22 1255  BP: 112/68  Pulse:  61  SpO2: 95%  Weight: 165 lb 9.6 oz (75.1 kg)  Height: '5\' 2"'$  (1.575 m)   Body mass index is 30.29 kg/m.  Gen: resting comfortably, no acute distress HEENT: no scleral icterus, pupils equal round and reactive, no palptable cervical adenopathy,  CV: RRR, 3/6 systolic murmur rusb, no jvd. +bilateral carotid bruits Resp: Clear to auscultation bilaterally GI: abdomen is soft, non-tender, non-distended, normal bowel sounds, no hepatosplenomegaly MSK: extremities are warm, no edema.  Skin: warm, no rash Neuro:  no focal deficits Psych: appropriate affect   Diagnostic Studies  Jan 2022 echo IMPRESSIONS     1. Left ventricular ejection fraction, by estimation, is 60 to 65%. The  left ventricle has normal function. The left ventricle has no regional  wall motion abnormalities. There is mild left ventricular hypertrophy.  Left ventricular diastolic parameters  are consistent with Grade II diastolic dysfunction (pseudonormalization).  Elevated left atrial pressure.   2. Right ventricular systolic function is normal. The right ventricular  size is normal. There is normal pulmonary artery systolic pressure.   3. The mitral valve is normal in structure. No evidence of mitral valve  regurgitation. No evidence of mitral stenosis.   4. The aortic valve is tricuspid. Aortic valve regurgitation is not  visualized. Mild aortic valve stenosis.    Assessment and Plan   1. CAD   - . Has been committed to indefinite DAPT  no symptoms, continue current meds   2. HTN - at goal, continue current meds   3. Hyperlipidemia - LDL 80 and above goal on max oral regimen with crestor 6mand zetia '10mg'$ . She is not interested in pcsk9i at this time.     4. Aortic stenosis -mild stenosis by recent [place]- repeat [place]   5. Carotid stenosis -mild carotid stenosis by recent [place]- repeat [place]25         JArnoldo Lenis M.D.

## 2022-02-04 ENCOUNTER — Other Ambulatory Visit: Payer: Self-pay | Admitting: *Deleted

## 2022-02-04 MED ORDER — LOSARTAN POTASSIUM 25 MG PO TABS: 25.0000 mg | ORAL_TABLET | Freq: Every day | ORAL | 1 refills | Status: DC

## 2022-03-12 ENCOUNTER — Other Ambulatory Visit: Payer: Self-pay | Admitting: Cardiology

## 2022-03-27 ENCOUNTER — Other Ambulatory Visit: Payer: Self-pay | Admitting: Cardiology

## 2022-04-25 DIAGNOSIS — Z961 Presence of intraocular lens: Secondary | ICD-10-CM | POA: Diagnosis not present

## 2022-04-25 DIAGNOSIS — H1132 Conjunctival hemorrhage, left eye: Secondary | ICD-10-CM | POA: Diagnosis not present

## 2022-04-30 ENCOUNTER — Other Ambulatory Visit: Payer: Self-pay | Admitting: Cardiology

## 2022-05-22 ENCOUNTER — Other Ambulatory Visit: Payer: Self-pay | Admitting: Cardiology

## 2022-06-25 ENCOUNTER — Other Ambulatory Visit: Payer: Self-pay | Admitting: Cardiology

## 2022-07-22 ENCOUNTER — Ambulatory Visit: Payer: Medicare Other | Attending: Cardiology | Admitting: Cardiology

## 2022-07-22 ENCOUNTER — Encounter: Payer: Self-pay | Admitting: Cardiology

## 2022-07-22 VITALS — BP 110/55 | HR 54 | Ht 62.0 in | Wt 156.8 lb

## 2022-07-22 DIAGNOSIS — I10 Essential (primary) hypertension: Secondary | ICD-10-CM

## 2022-07-22 DIAGNOSIS — Z79899 Other long term (current) drug therapy: Secondary | ICD-10-CM

## 2022-07-22 DIAGNOSIS — R7301 Impaired fasting glucose: Secondary | ICD-10-CM | POA: Diagnosis not present

## 2022-07-22 DIAGNOSIS — I350 Nonrheumatic aortic (valve) stenosis: Secondary | ICD-10-CM

## 2022-07-22 DIAGNOSIS — I6523 Occlusion and stenosis of bilateral carotid arteries: Secondary | ICD-10-CM | POA: Diagnosis not present

## 2022-07-22 DIAGNOSIS — I2510 Atherosclerotic heart disease of native coronary artery without angina pectoris: Secondary | ICD-10-CM | POA: Diagnosis not present

## 2022-07-22 DIAGNOSIS — E782 Mixed hyperlipidemia: Secondary | ICD-10-CM | POA: Diagnosis not present

## 2022-07-22 DIAGNOSIS — R002 Palpitations: Secondary | ICD-10-CM

## 2022-07-22 MED ORDER — HYDRALAZINE HCL 50 MG PO TABS: 75.0000 mg | ORAL_TABLET | Freq: Three times a day (TID) | ORAL | 6 refills | Status: DC

## 2022-07-22 NOTE — Patient Instructions (Signed)
Medication Instructions:   Decrease Hydralazine to 75mg  three times per day  Continue all other medications.     Labwork:  CMET, CBC, TSH, Mg, FLP, HgA1c - orders given  Reminder:  Nothing to eat or drink after 12 midnight prior to labs. Office will contact with results via phone, letter or mychart.    Testing/Procedures:  none  Follow-Up:  6 months   Any Other Special Instructions Will Be Listed Below (If Applicable).   If you need a refill on your cardiac medications before your next appointment, please call your pharmacy.

## 2022-07-22 NOTE — Progress Notes (Signed)
Clinical Summary Ms. Spiteri is a 67 y.o.female seen today for follow up of the following medical problems.    1. CAD   - prior CABG in 2007 3 vessel (LIMA to LAD, SVG to OM, SVG to PDA)  - Echo 12/2012 LVEF 60-65%   - 01/2014 exercise MPI: did not reach THR, exercise limited by SOB and chest pain. Converted to [employer]. Apical anterior and anterolateral ischemia, intermediate risk. LVEF 62% - 06/2015 cath received stent to SVG-OM suboptimal result. If continued symptoms consider PCI of RCA 06/2015 echo: LVEF 65-70%, no WMAs, grade I diastolic dysfunction, mild to moderate AS - she is on indefinite DAPT    - no chest pain, no SOB/DOE - compliant with meds   2. HTN - she is compliant with meds - down about 10 lbs since 01/2022, adjusted diet   3. Hyperlipidemia - compliant with crestor and zetia   06/2019 TC 145 HDL 49 LDL 76 TG 118 Jan 2022 TC 161 HDL 54 TG 72 LDL 65  Jan 2023 TC 152 TG 79 HDL 57 LDL 80 - has not been interested in psck9i     4.Aortic stenosis 12/2016 echo mild to moderate AS Jan 2022 echo LVEF 60-65%, grade II dd, mild AS mean grad 11  AVA VTI 1.6 - needs repeat Jan 2025 - no symptoms   5. Carotid stenosis - carotid US 01/2015 with bilateral 1-39% disease.  2018 [place]: mild bilateral disease.  -no neuro symptoms  07/2020 mild <50% bilateral ICA disease -repeat 2025     6. LE edema - denies any recent edema. She is on lasix 20mg  daily.    SH: enjoys spending time outside, planting flowers. Past Medical History:  Diagnosis Date   Acute posthemorrhagic anemia    Atherosclerotic heart disease of native coronary artery without angina pectoris    Bleeding ulcer 06/2015   CAD (coronary artery disease)    Multivessel disease s/p CABG, DES SVG to OM  July 03 2015 (occluded SVG to PDA)   Cervical cancer (HCC)    Chronic bronchitis    Chronic diastolic (congestive) heart failure (HCC)    Chronic obstructive pulmonary disease, unspecified (HCC)     Coarctation of aorta    Constipation, unspecified    COPD (chronic obstructive pulmonary disease) (HCC)    Depression    Essential (primary) hypertension    Essential hypertension    Gastrointestinal hemorrhage, unspecified    GERD (gastroesophageal reflux disease)    Heart disease    Heart failure, unspecified (HCC)    Heart murmur    Hyperlipidemia    Hyperlipidemia, unspecified    Hypertensive chronic kidney disease with stage 1 through stage 4 chronic kidney disease, or unspecified chronic kidney disease    Hypokalemia    Hypokalemia    Hypomagnesemia    Malignant neoplasm of cervix uteri, unspecified (HCC)    Nicotine dependence, unspecified, uncomplicated    NSTEMI (non-ST elevated myocardial infarction) [facility])    April 2017   NSTEMI (non-ST elevated myocardial infarction) (HCC)    Obesity (BMI 30-39.9)    Other nonrheumatic aortic valve disorders      No Known Allergies   Current Outpatient Medications  Medication Sig Dispense Refill   acetaminophen (TYLENOL) 325 MG tablet Take 650 mg by mouth every 6 (six) hours as needed (pain).     amLODipine (NORVASC) 10 MG tablet Take 1 tablet by mouth once daily 90 tablet 3   aspirin EC 81 MG  EC tablet Take 1 tablet (81 mg total) by mouth daily. (Patient taking differently: Take 81 mg by mouth in the morning.)     clopidogrel (PLAVIX) 75 MG tablet Take 1 tablet by mouth once daily with breakfast 90 tablet 3   ezetimibe (ZETIA) 10 MG tablet Take 1 tablet by mouth once daily 90 tablet 0   furosemide (LASIX) 20 MG tablet Take 1 tablet by mouth once daily 90 tablet 0   hydrALAZINE (APRESOLINE) 100 MG tablet TAKE 1 TABLET BY MOUTH THREE TIMES DAILY 270 tablet 1   ILEVRO 0.3 % ophthalmic suspension 1 drop daily. (Patient not taking: Reported on 01/14/2022)     isosorbide mononitrate (IMDUR) 120 MG 24 hr tablet Take 1 tablet by mouth once daily 90 tablet 3   losartan (COZAAR) 25 MG tablet Take 1 tablet (25 mg total) by mouth daily. 90  tablet 1   metoprolol tartrate (LOPRESSOR) 50 MG tablet TAKE 1 & 1/2 (ONE & ONE-HALF) TABLETS BY MOUTH TWICE DAILY 270 tablet 3   moxifloxacin (VIGAMOX) 0.5 % ophthalmic solution Apply 1 drop to eye 3 (three) times daily. (Patient not taking: Reported on 01/14/2022)     nitroGLYCERIN (NITROSTAT) 0.4 MG SL tablet Place 1 tablet (0.4 mg total) every 5 (five) minutes as needed under the tongue for chest pain. 25 tablet 3   pantoprazole (PROTONIX) 20 MG tablet Take 1 tablet by mouth once daily 90 tablet 0   pantoprazole (PROTONIX) 40 MG tablet Take 40 mg by mouth daily before breakfast.     prednisoLONE acetate (PRED FORTE) 1 % ophthalmic suspension 1 drop 3 (three) times daily. (Patient not taking: Reported on 01/14/2022)     rosuvastatin (CRESTOR) 40 MG tablet TAKE 1 TABLET BY MOUTH AT BEDTIME 90 tablet 0   No current facility-administered medications for this visit.     Past Surgical History:  Procedure Laterality Date   BREAST BIOPSY Left    Benign   CARDIAC CATHETERIZATION N/A 06/30/2015   Procedure: Left Heart Cath and Cors/Grafts Angiography;  Surgeon: Marykay Lex, MD;  Location: [facility] INVASIVE CV LAB;  Service: Cardiovascular;  Laterality: N/A;   CARDIAC CATHETERIZATION N/A 07/03/2015   Procedure: Coronary Stent Intervention;  Surgeon: Peter M [place], MD;  Location: [facility] INVASIVE CV LAB;  Service: Cardiovascular;  Laterality: N/A;   CATARACT EXTRACTION W/PHACO Left 03/16/2021   Procedure: CATARACT EXTRACTION PHACO AND INTRAOCULAR LENS PLACEMENT (IOC);  Surgeon: Fabio Pierce, MD;  Location: [facility];  Service: Ophthalmology;  Laterality: Left;  CDE: 21.42   CATARACT EXTRACTION W/PHACO Right 03/30/2021   Procedure: CATARACT EXTRACTION PHACO AND INTRAOCULAR LENS PLACEMENT (IOC);  Surgeon: Fabio Pierce, MD;  Location: [facility];  Service: Ophthalmology;  Laterality: Right;  CDE: 25.28   CERVICAL CONE BIOPSY     CHOLECYSTECTOMY     CORONARY ARTERY BYPASS GRAFT  02/2006   LIMA to LAD, SVG to OM, SVG  to PDA   ESOPHAGOGASTRODUODENOSCOPY N/A 07/22/2014   Procedure: ESOPHAGOGASTRODUODENOSCOPY (EGD);  Surgeon: Iva Boop, MD;  Location: [facility] ENDOSCOPY;  Service: Endoscopy;  Laterality: N/A;   ESOPHAGOGASTRODUODENOSCOPY N/A 07/12/2015   Procedure: ESOPHAGOGASTRODUODENOSCOPY (EGD);  Surgeon: Malissa Hippo, MD;  [facility];  Service: Endoscopy;  Laterality: N/A;   ESOPHAGOGASTRODUODENOSCOPY N/A 10/18/2015   Procedure: ESOPHAGOGASTRODUODENOSCOPY (EGD);  Surgeon: Malissa Hippo, MD;  [facility];  Service: Endoscopy;  Laterality: N/A;  2:55     No Known Allergies    Family History  Problem Relation Age of  Onset   Heart attack Father 80   Lung cancer Mother 55     Social History Ms. Schweppe reports that she has been smoking cigarettes. She started smoking about 53 years ago. She has a 50.00 pack-year smoking history. She has never been exposed to tobacco smoke. She has never used smokeless tobacco. Ms. Yauch reports no history of alcohol use.   Review of Systems CONSTITUTIONAL: No weight loss, fever, chills, weakness or fatigue.  HEENT: Eyes: No visual loss, blurred vision, double vision or yellow sclerae.No hearing loss, sneezing, congestion, runny nose or sore throat.  SKIN: No rash or itching.  CARDIOVASCULAR: per hpi RESPIRATORY: No shortness of breath, cough or sputum.  GASTROINTESTINAL: No anorexia, nausea, vomiting or diarrhea. No abdominal pain or blood.  GENITOURINARY: No burning on urination, no polyuria NEUROLOGICAL: No headache, dizziness, syncope, paralysis, ataxia, numbness or tingling in the extremities. No change in bowel or bladder control.  MUSCULOSKELETAL: No muscle, back pain, joint pain or stiffness.  LYMPHATICS: No enlarged nodes. No history of splenectomy.  PSYCHIATRIC: No history of depression or anxiety.  ENDOCRINOLOGIC: No reports of sweating, cold or heat intolerance. No polyuria or polydipsia.  Marland Kitchen   Physical Examination Today's  Vitals   07/22/22 1053  BP: (!) 96/50  Pulse: (!) 54  SpO2: 95%  Weight: 156 lb 12.8 oz (71.1 kg)  Height: 5\' 2"  (1.575 m)   Body mass index is 28.68 kg/m.  Gen: resting comfortably, no acute distress HEENT: no scleral icterus, pupils equal round and reactive, no palptable cervical adenopathy,  CV: RRR, 3/6 systolic murmur rusb, bilateral carotid bruits Resp: Clear to auscultation bilaterally GI: abdomen is soft, non-tender, non-distended, normal bowel sounds, no hepatosplenomegaly MSK: extremities are warm, no edema.  Skin: warm, no rash Neuro:  no focal deficits Psych: appropriate affect   Diagnostic Studies  Jan 2022 echo IMPRESSIONS     1. Left ventricular ejection fraction, by estimation, is 60 to 65%. The  left ventricle has normal function. The left ventricle has no regional  wall motion abnormalities. There is mild left ventricular hypertrophy.  Left ventricular diastolic parameters  are consistent with Grade II diastolic dysfunction (pseudonormalization).  Elevated left atrial pressure.   2. Right ventricular systolic function is normal. The right ventricular  size is normal. There is normal pulmonary artery systolic pressure.   3. The mitral valve is normal in structure. No evidence of mitral valve  regurgitation. No evidence of mitral stenosis.   4. The aortic valve is tricuspid. Aortic valve regurgitation is not  visualized. Mild aortic valve stenosis.      Assessment and Plan  1. CAD   - . Has been committed to indefinite DAPT - no symptoms, continue current meds - EKG today sinus brady 54 no specific ischemic changes   2. HTN -soft bp's today, likely due to 10 lbs weight loss - lower hydralazien to 75mg  tid   3. Hyperlipidemia - LDL 80 and above goal on max oral regimen with crestor 62m and zetia 10mg . She is not interested in pcsk9i at this time.  - we will repeat lipid panel    4. Aortic stenosis -mild stenosis by prior  -repeat US Jan 2025    5. Carotid stenosis -mild carotid stenosis by recent US - we will repeat carotid US Jan 2025      Antoine Poche, M.D

## 2022-07-26 ENCOUNTER — Other Ambulatory Visit: Payer: Self-pay

## 2022-07-26 DIAGNOSIS — I2510 Atherosclerotic heart disease of native coronary artery without angina pectoris: Secondary | ICD-10-CM | POA: Diagnosis not present

## 2022-07-26 DIAGNOSIS — E782 Mixed hyperlipidemia: Secondary | ICD-10-CM | POA: Diagnosis not present

## 2022-07-26 DIAGNOSIS — I10 Essential (primary) hypertension: Secondary | ICD-10-CM | POA: Diagnosis not present

## 2022-07-26 DIAGNOSIS — R002 Palpitations: Secondary | ICD-10-CM | POA: Diagnosis not present

## 2022-07-26 DIAGNOSIS — R7301 Impaired fasting glucose: Secondary | ICD-10-CM | POA: Diagnosis not present

## 2022-07-26 DIAGNOSIS — Z79899 Other long term (current) drug therapy: Secondary | ICD-10-CM | POA: Diagnosis not present

## 2022-07-26 MED ORDER — NITROGLYCERIN 0.4 MG SL SUBL: 0.4000 mg | SUBLINGUAL_TABLET | SUBLINGUAL | 3 refills | Status: DC | PRN

## 2022-07-27 LAB — CBC
HCT: 38.8 % (ref 35.0–45.0)
Hemoglobin: 12.8 g/dL (ref 11.7–15.5)
MCH: 31.1 pg (ref 27.0–33.0)
MCHC: 33 g/dL (ref 32.0–36.0)
MCV: 94.4 fL (ref 80.0–100.0)
MPV: 11.7 fL (ref 7.5–12.5)
Platelets: 160 10*3/uL (ref 140–400)
RBC: 4.11 10*6/uL (ref 3.80–5.10)
RDW: 12.3 % (ref 11.0–15.0)
WBC: 6.2 10*3/uL (ref 3.8–10.8)

## 2022-07-27 LAB — HEMOGLOBIN A1C
Hgb A1c MFr Bld: 5.5 % of total Hgb (ref <5.7)
Mean Plasma Glucose: 111 mg/dL
eAG (mmol/L): 6.2 mmol/L

## 2022-07-27 LAB — TSH: TSH: 3.06 mIU/L (ref 0.40–4.50)

## 2022-07-27 LAB — LIPID PANEL
Cholesterol: 146 mg/dL (ref <200)
HDL: 55 mg/dL (ref >=50)
LDL Cholesterol (Calc): 74 mg/dL (calc)
Non-HDL Cholesterol (Calc): 91 mg/dL (calc) (ref <130)
Total CHOL/HDL Ratio: 2.7 (calc) (ref <5.0)
Triglycerides: 89 mg/dL (ref <150)

## 2022-07-27 LAB — MAGNESIUM: Magnesium: 2.3 mg/dL (ref 1.5–2.5)

## 2022-07-31 ENCOUNTER — Other Ambulatory Visit: Payer: Self-pay | Admitting: Cardiology

## 2022-08-12 ENCOUNTER — Other Ambulatory Visit: Payer: Self-pay | Admitting: Cardiology

## 2022-08-20 ENCOUNTER — Encounter: Payer: Self-pay | Admitting: *Deleted

## 2022-09-10 ENCOUNTER — Other Ambulatory Visit: Payer: Self-pay | Admitting: Cardiology

## 2022-09-24 ENCOUNTER — Telehealth: Payer: Self-pay | Admitting: Cardiology

## 2022-09-24 ENCOUNTER — Other Ambulatory Visit: Payer: Self-pay | Admitting: Cardiology

## 2022-09-24 MED ORDER — FUROSEMIDE 20 MG PO TABS: 20.0000 mg | ORAL_TABLET | Freq: Every day | ORAL | 1 refills | Status: DC

## 2022-09-24 NOTE — Telephone Encounter (Signed)
Pharmacy did not receive prior refill for this medication for some reason when sent in on 7/2. Medication sent in again on 7/16

## 2022-09-24 NOTE — Telephone Encounter (Signed)
Pt c/o medication issue:  1. Name of Medication:   furosemide (LASIX) 20 MG tablet    2. How are you currently taking this medication (dosage and times per day)?  Take 1 tablet by mouth once daily       3. Are you having a reaction (difficulty breathing--STAT)? No  4. What is your medication issue? Pt is requesting a callback regarding this medication she's been waiting on for 2 weeks. She was informed that pharmacy received it on 7/2 that morning but she stated every time she calls them, they tell her they don't have it. She'd like a callback for assistance on what she should do. Please advise

## 2023-01-10 ENCOUNTER — Ambulatory Visit: Payer: Medicare Other | Attending: Cardiology | Admitting: Cardiology

## 2023-01-10 ENCOUNTER — Encounter: Payer: Self-pay | Admitting: Cardiology

## 2023-01-10 VITALS — BP 118/54 | HR 60 | Ht 62.0 in | Wt 157.0 lb

## 2023-01-10 DIAGNOSIS — E782 Mixed hyperlipidemia: Secondary | ICD-10-CM

## 2023-01-10 DIAGNOSIS — I350 Nonrheumatic aortic (valve) stenosis: Secondary | ICD-10-CM | POA: Diagnosis not present

## 2023-01-10 DIAGNOSIS — I2510 Atherosclerotic heart disease of native coronary artery without angina pectoris: Secondary | ICD-10-CM

## 2023-01-10 DIAGNOSIS — I10 Essential (primary) hypertension: Secondary | ICD-10-CM | POA: Diagnosis not present

## 2023-01-10 DIAGNOSIS — I6523 Occlusion and stenosis of bilateral carotid arteries: Secondary | ICD-10-CM | POA: Diagnosis not present

## 2023-01-10 NOTE — Patient Instructions (Signed)
Medication Instructions:   Continue all current medications.   Labwork:  none  Testing/Procedures:  Your physician has requested that you have an echocardiogram. Echocardiography is a painless test that uses sound waves to create images of your heart. It provides your doctor with information about the size and shape of your heart and how well your heart's chambers and valves are working. This procedure takes approximately one hour. There are no restrictions for this procedure. Please do NOT wear cologne, perfume, aftershave, or lotions (deodorant is allowed). Please arrive 15 minutes prior to your appointment time.  Please note: We ask at that you not bring children with you during ultrasound (echo/ vascular) testing. Due to room size and safety concerns, children are not allowed in the ultrasound rooms during exams. Our front office staff cannot provide observation of children in our lobby area while testing is being conducted. An adult accompanying a patient to their appointment will only be allowed in the ultrasound room at the discretion of the ultrasound technician under special circumstances. We apologize for any inconvenience.  Your physician has requested that you have a carotid duplex. This test is an ultrasound of the carotid arteries in your neck. It looks at blood flow through these arteries that supply the brain with blood. Allow one hour for this exam. There are no restrictions or special instructions.  DUE JANUARY 2025  Follow-Up:  6 months   Any Other Special Instructions Will Be Listed Below (If Applicable).   If you need a refill on your cardiac medications before your next appointment, please call your pharmacy.

## 2023-01-10 NOTE — Progress Notes (Signed)
Clinical Summary Christina Boyle is a 67 y.o.female seen today for follow up of the following medical problems.    1. CAD   - prior CABG in 2007 3 vessel (LIMA to LAD, SVG to OM, SVG to PDA)  - Echo 12/2012 LVEF 60-65%   - 01/2014 exercise MPI: did not reach THR, exercise limited by SOB and chest pain. Converted to [employer]. Apical anterior and anterolateral ischemia, intermediate risk. LVEF 62% - 06/2015 cath received stent to SVG-OM suboptimal result. If continued symptoms consider PCI of RCA 06/2015 echo: LVEF 65-70%, no WMAs, grade I diastolic dysfunction, mild to moderate AS - she is on indefinite DAPT    - denies any chest pain, no SOB/DOE -compliant with meds   2. HTN - compliant with meds - has had some weight loss, have weaned bp meds over time.    3. Hyperlipidemia - compliant with crestor and zetia   06/2019 TC 145 HDL 49 LDL 76 TG 118 Jan 2022 TC 161 HDL 54 TG 72 LDL 65  Jan 2023 TC 152 TG 79 HDL 57 LDL 80 07/2022 TC 146 TG 89 HDL 55 LDL 74 - has not been interested in psck9i     4.Aortic stenosis 12/2016 echo mild to moderate AS Jan 2022 echo LVEF 60-65%, grade II dd, mild AS mean grad 11  AVA VTI 1.6 - needs repeat Jan 2025    5. Carotid stenosis - carotid US 01/2015 with bilateral 1-39% disease.  2018 [place]: mild bilateral disease.  -no neuro symptoms  07/2020 mild <50% bilateral ICA disease -repeat 2025     6. LE edema - denies any recent edema. She is on lasix 20mg  daily.    SH: enjoys spending time outside, planting flowers. Past Medical History:  Diagnosis Date   Acute posthemorrhagic anemia    Atherosclerotic heart disease of native coronary artery without angina pectoris    Bleeding ulcer 06/2015   CAD (coronary artery disease)    Multivessel disease s/p CABG, DES SVG to OM  July 03 2015 (occluded SVG to PDA)   Cervical cancer (HCC)    Chronic bronchitis    Chronic diastolic (congestive) heart failure (HCC)    Chronic obstructive pulmonary  disease, unspecified (HCC)    Coarctation of aorta    Constipation, unspecified    COPD (chronic obstructive pulmonary disease) (HCC)    Depression    Essential (primary) hypertension    Essential hypertension    Gastrointestinal hemorrhage, unspecified    GERD (gastroesophageal reflux disease)    Heart disease    Heart failure, unspecified (HCC)    Heart murmur    Hyperlipidemia    Hyperlipidemia, unspecified    Hypertensive chronic kidney disease with stage 1 through stage 4 chronic kidney disease, or unspecified chronic kidney disease    Hypokalemia    Hypokalemia    Hypomagnesemia    Malignant neoplasm of cervix uteri, unspecified (HCC)    Nicotine dependence, unspecified, uncomplicated    NSTEMI (non-ST elevated myocardial infarction) [facility])    April 2017   NSTEMI (non-ST elevated myocardial infarction) (HCC)    Obesity (BMI 30-39.9)    Other nonrheumatic aortic valve disorders      No Known Allergies   Current Outpatient Medications  Medication Sig Dispense Refill   acetaminophen (TYLENOL) 325 MG tablet Take 650 mg by mouth every 6 (six) hours as needed (pain).     amLODipine (NORVASC) 10 MG tablet Take 1 tablet by mouth once daily  90 tablet 3   aspirin EC 81 MG EC tablet Take 1 tablet (81 mg total) by mouth daily.     clopidogrel (PLAVIX) 75 MG tablet Take 1 tablet by mouth once daily with breakfast 90 tablet 3   ezetimibe (ZETIA) 10 MG tablet Take 1 tablet by mouth once daily 90 tablet 1   furosemide (LASIX) 20 MG tablet Take 1 tablet (20 mg total) by mouth daily. 90 tablet 1   hydrALAZINE (APRESOLINE) 50 MG tablet Take 1.5 tablets (75 mg total) by mouth 3 (three) times daily. 135 tablet 6   isosorbide mononitrate (IMDUR) 120 MG 24 hr tablet Take 1 tablet by mouth once daily 90 tablet 3   losartan (COZAAR) 25 MG tablet Take 1 tablet by mouth once daily 90 tablet 1   metoprolol tartrate (LOPRESSOR) 50 MG tablet TAKE 1 & 1/2 (ONE & ONE-HALF) TABLETS BY MOUTH TWICE DAILY  270 tablet 3   nitroGLYCERIN (NITROSTAT) 0.4 MG SL tablet Place 1 tablet (0.4 mg total) under the tongue every 5 (five) minutes as needed for chest pain. 25 tablet 3   rosuvastatin (CRESTOR) 40 MG tablet TAKE 1 TABLET BY MOUTH AT BEDTIME 90 tablet 1   No current facility-administered medications for this visit.     Past Surgical History:  Procedure Laterality Date   BREAST BIOPSY Left    Benign   CARDIAC CATHETERIZATION N/A 06/30/2015   Procedure: Left Heart Cath and Cors/Grafts Angiography;  Surgeon: Marykay Lex, MD;  Location: [facility] INVASIVE CV LAB;  Service: Cardiovascular;  Laterality: N/A;   CARDIAC CATHETERIZATION N/A 07/03/2015   Procedure: Coronary Stent Intervention;  Surgeon: Peter M Swaziland, MD;  Location: [facility] INVASIVE CV LAB;  Service: Cardiovascular;  Laterality: N/A;   CATARACT EXTRACTION W/PHACO Left 03/16/2021   Procedure: CATARACT EXTRACTION PHACO AND INTRAOCULAR LENS PLACEMENT (IOC);  Surgeon: Fabio Pierce, MD;  Location: [facility] ORS;  Service: Ophthalmology;  Laterality: Left;  CDE: 21.42   CATARACT EXTRACTION W/PHACO Right 03/30/2021   Procedure: CATARACT EXTRACTION PHACO AND INTRAOCULAR LENS PLACEMENT (IOC);  Surgeon: Fabio Pierce, MD;  Location: [facility] ORS;  Service: Ophthalmology;  Laterality: Right;  CDE: 25.28   CERVICAL CONE BIOPSY     CHOLECYSTECTOMY     CORONARY ARTERY BYPASS GRAFT  02/2006   LIMA to LAD, SVG to OM, SVG to PDA   ESOPHAGOGASTRODUODENOSCOPY N/A 07/22/2014   Procedure: ESOPHAGOGASTRODUODENOSCOPY (EGD);  Surgeon: Iva Boop, MD;  Location: [facility] ENDOSCOPY;  Service: Endoscopy;  Laterality: N/A;   ESOPHAGOGASTRODUODENOSCOPY N/A 07/12/2015   Procedure: ESOPHAGOGASTRODUODENOSCOPY (EGD);  Surgeon: Malissa Hippo, MD;  Location: [facility] ENDO SU[facility];  Service: Endoscopy;  Laterality: N/A;   ESOPHAGOGASTRODUODENOSCOPY N/A 10/18/2015   Procedure: ESOPHAGOGASTRODUODENOSCOPY (EGD);  Surgeon: Malissa Hippo, MD;  Location: [facility] ENDO SUITE;  Service: Endoscopy;  Laterality: N/A;   2:55     No Known Allergies    Family History  Problem Relation Age of Onset   Heart attack Father 58   Lung cancer Mother 68     Social History Christina Boyle reports that she has been smoking cigarettes. She started smoking about 53 years ago. She has a 53.6 pack-year smoking history. She has never been exposed to tobacco smoke. She has never used smokeless tobacco. Christina Boyle reports no history of alcohol use.   Review of Systems CONSTITUTIONAL: No weight loss, fever, chills, weakness or fatigue.  HEENT: Eyes: No visual loss, blurred vision, double vision or yellow sclerae.No hearing loss, sneezing, congestion, runny nose or sore  throat.  SKIN: No rash or itching.  CARDIOVASCULAR: per hpi RESPIRATORY: No shortness of breath, cough or sputum.  GASTROINTESTINAL: No anorexia, nausea, vomiting or diarrhea. No abdominal pain or blood.  GENITOURINARY: No burning on urination, no polyuria NEUROLOGICAL: No headache, dizziness, syncope, paralysis, ataxia, numbness or tingling in the extremities. No change in bowel or bladder control.  MUSCULOSKELETAL: No muscle, back pain, joint pain or stiffness.  LYMPHATICS: No enlarged nodes. No history of splenectomy.  PSYCHIATRIC: No history of depression or anxiety.  ENDOCRINOLOGIC: No reports of sweating, cold or heat intolerance. No polyuria or polydipsia.  Marland Kitchen   Physical Examination Today's Vitals   01/10/23 0951  BP: (!) 118/54  Pulse: 60  SpO2: 93%  Weight: 157 lb (71.2 kg)  Height: 5\' 2"  (1.575 m)   Body mass index is 28.72 kg/m.  Gen: resting comfortably, no acute distress HEENT: no scleral icterus, pupils equal round and reactive, no palptable cervical adenopathy,  CV: RRR, no mrg, no jvd Resp: Clear to auscultation bilaterally GI: abdomen is soft, non-tender, non-distended, normal bowel sounds, no hepatosplenomegaly MSK: extremities are warm, no edema.  Skin: warm, no rash Neuro:  no focal deficits Psych: appropriate  affect   Diagnostic Studies  Jan 2022 echo IMPRESSIONS     1. Left ventricular ejection fraction, by estimation, is 60 to 65%. The  left ventricle has normal function. The left ventricle has no regional  wall motion abnormalities. There is mild left ventricular hypertrophy.  Left ventricular diastolic parameters  are consistent with Grade II diastolic dysfunction (pseudonormalization).  Elevated left atrial pressure.   2. Right ventricular systolic function is normal. The right ventricular  size is normal. There is normal pulmonary artery systolic pressure.   3. The mitral valve is normal in structure. No evidence of mitral valve  regurgitation. No evidence of mitral stenosis.   4. The aortic valve is tricuspid. Aortic valve regurgitation is not  visualized. Mild aortic valve stenosis.    Assessment and Plan   1. CAD   - . Has been committed to indefinite DAPT - no symptoms, continue current meds   2. HTN -at goal, continue current meds. BP's have naturally come down with ongoign weight loss, have been downtitrating meds over time.    3. Hyperlipidemia - LDL above goal. She is not interested in pcsk9i at this time.  - continue crestor, zetia. She is on max oral regimen.     4. Aortic stenosis -mild stenosis by prior  - repeat echo Jan 2025   5. Carotid stenosis -mild carotid stenosis by recent US - repeat carotid US Jan 2025       Antoine Poche, M.D.

## 2023-01-28 ENCOUNTER — Other Ambulatory Visit: Payer: Self-pay | Admitting: Cardiology

## 2023-02-10 ENCOUNTER — Other Ambulatory Visit: Payer: Self-pay | Admitting: Cardiology

## 2023-02-17 ENCOUNTER — Encounter: Payer: Self-pay | Admitting: *Deleted

## 2023-03-10 ENCOUNTER — Other Ambulatory Visit: Payer: Self-pay | Admitting: Cardiology

## 2023-04-03 ENCOUNTER — Ambulatory Visit (INDEPENDENT_AMBULATORY_CARE_PROVIDER_SITE_OTHER): Payer: Medicare Other

## 2023-04-03 ENCOUNTER — Ambulatory Visit: Payer: Medicare Other | Attending: Cardiology

## 2023-04-03 DIAGNOSIS — I6523 Occlusion and stenosis of bilateral carotid arteries: Secondary | ICD-10-CM | POA: Insufficient documentation

## 2023-04-03 DIAGNOSIS — I350 Nonrheumatic aortic (valve) stenosis: Secondary | ICD-10-CM | POA: Insufficient documentation

## 2023-04-04 LAB — ECHOCARDIOGRAM COMPLETE
AR max vel: 1.62 cm2
AV Area VTI: 1.57 cm2
AV Area mean vel: 1.54 cm2
AV Mean grad: 11.5 mm[Hg]
AV Peak grad: 18.1 mm[Hg]
Ao pk vel: 2.13 m/s
Area-P 1/2: 4.21 cm2
Calc EF: 61.4 %
S' Lateral: 3 cm
Single Plane A2C EF: 58.5 %
Single Plane A4C EF: 60.9 %

## 2023-04-17 ENCOUNTER — Encounter: Payer: Self-pay | Admitting: *Deleted

## 2023-04-25 ENCOUNTER — Telehealth: Payer: Self-pay | Admitting: *Deleted

## 2023-04-25 NOTE — Telephone Encounter (Signed)
Correction - no pcp.   Me     04/25/23  3:03 PM Result Note Notified, copy to pcp. April 24, 2023 Me     04/24/23  1:46 PM Result Note Left message to return call. View older events  April 18, 2023 Antoine Poche, MD to [place]     04/18/23  7:49 AM Result Note Echo shows normal heart pumping function. Aortic valve remains just mildly stiff, something we will continue to monitor with a repeat echo in 3-5 years Dominga Ferry MD

## 2023-05-12 ENCOUNTER — Other Ambulatory Visit: Payer: Self-pay | Admitting: Cardiology

## 2023-06-09 ENCOUNTER — Other Ambulatory Visit: Payer: Self-pay | Admitting: Cardiology

## 2023-07-11 ENCOUNTER — Telehealth: Payer: Self-pay | Admitting: Cardiology

## 2023-07-11 NOTE — Telephone Encounter (Signed)
 Patient stopped by to update her pharmacy. She is now using [facility] in [place] on [address]. Phone number is [phone].

## 2023-07-11 NOTE — Telephone Encounter (Signed)
 Pt pharmacy updated [facility] [place]

## 2023-07-27 ENCOUNTER — Other Ambulatory Visit: Payer: Self-pay | Admitting: Cardiology

## 2023-08-13 ENCOUNTER — Other Ambulatory Visit: Payer: Self-pay

## 2023-08-13 MED ORDER — LOSARTAN POTASSIUM 25 MG PO TABS: 25.0000 mg | ORAL_TABLET | Freq: Every day | ORAL | 1 refills | Status: DC

## 2023-08-21 ENCOUNTER — Encounter: Payer: Self-pay | Admitting: Cardiology

## 2023-08-22 ENCOUNTER — Telehealth: Payer: Self-pay | Admitting: Cardiology

## 2023-08-22 NOTE — Telephone Encounter (Signed)
 Pt returning call to schedule from recall. Per recall it says needs Echo and Carotid before f/u but there is not orders in.

## 2023-08-22 NOTE — Telephone Encounter (Signed)
 Patient already had her Echo & Carotid 04/03/2023.  She should just need her 6 month appointment now.

## 2023-08-26 ENCOUNTER — Other Ambulatory Visit: Payer: Self-pay | Admitting: Cardiology

## 2023-09-09 ENCOUNTER — Other Ambulatory Visit: Payer: Self-pay

## 2023-09-09 MED ORDER — METOPROLOL TARTRATE 50 MG PO TABS: 75.0000 mg | ORAL_TABLET | Freq: Two times a day (BID) | ORAL | 1 refills | Status: AC

## 2023-09-09 MED ORDER — FUROSEMIDE 20 MG PO TABS: 20.0000 mg | ORAL_TABLET | Freq: Every day | ORAL | 1 refills | Status: DC

## 2023-09-09 MED ORDER — ROSUVASTATIN CALCIUM 40 MG PO TABS: 40.0000 mg | ORAL_TABLET | Freq: Every day | ORAL | 0 refills | Status: DC

## 2023-09-09 MED ORDER — ISOSORBIDE MONONITRATE ER 120 MG PO TB24: 120.0000 mg | ORAL_TABLET | Freq: Every day | ORAL | 1 refills | Status: DC

## 2023-09-09 MED ORDER — EZETIMIBE 10 MG PO TABS: 10.0000 mg | ORAL_TABLET | Freq: Every day | ORAL | 1 refills | Status: AC

## 2023-09-09 MED ORDER — AMLODIPINE BESYLATE 10 MG PO TABS: 10.0000 mg | ORAL_TABLET | Freq: Every day | ORAL | 1 refills | Status: DC

## 2023-09-09 MED ORDER — CLOPIDOGREL BISULFATE 75 MG PO TABS
75.0000 mg | ORAL_TABLET | Freq: Every day | ORAL | 1 refills | Status: AC
Start: 2023-09-09 — End: ?

## 2023-09-09 MED ORDER — NITROGLYCERIN 0.4 MG SL SUBL: 0.4000 mg | SUBLINGUAL_TABLET | SUBLINGUAL | 3 refills | Status: AC | PRN

## 2023-09-18 ENCOUNTER — Ambulatory Visit: Attending: Cardiology | Admitting: Cardiology

## 2023-09-18 ENCOUNTER — Encounter: Payer: Self-pay | Admitting: Cardiology

## 2023-09-18 VITALS — BP 132/74 | HR 60 | Ht 62.0 in | Wt 162.8 lb

## 2023-09-18 DIAGNOSIS — R002 Palpitations: Secondary | ICD-10-CM | POA: Insufficient documentation

## 2023-09-18 DIAGNOSIS — E782 Mixed hyperlipidemia: Secondary | ICD-10-CM | POA: Diagnosis not present

## 2023-09-18 DIAGNOSIS — I6523 Occlusion and stenosis of bilateral carotid arteries: Secondary | ICD-10-CM | POA: Insufficient documentation

## 2023-09-18 DIAGNOSIS — I10 Essential (primary) hypertension: Secondary | ICD-10-CM | POA: Diagnosis not present

## 2023-09-18 DIAGNOSIS — Z79899 Other long term (current) drug therapy: Secondary | ICD-10-CM | POA: Insufficient documentation

## 2023-09-18 DIAGNOSIS — R7309 Other abnormal glucose: Secondary | ICD-10-CM | POA: Insufficient documentation

## 2023-09-18 DIAGNOSIS — I2510 Atherosclerotic heart disease of native coronary artery without angina pectoris: Secondary | ICD-10-CM | POA: Diagnosis not present

## 2023-09-18 NOTE — Patient Instructions (Addendum)
 Medication Instructions:   Continue all current medications.   Labwork:  CMET, CBC, TSH, HgbA1c, FLP, Mg - orders given today  Reminder:  Nothing to eat or drink after 12 midnight prior to labs. Office will contact with results via phone, letter or mychart.     Testing/Procedures:  none  Follow-Up:  6 months   Any Other Special Instructions Will Be Listed Below (If Applicable).   If you need a refill on your cardiac medications before your next appointment, please call your pharmacy.

## 2023-09-18 NOTE — Progress Notes (Signed)
 Clinical Summary Christina Boyle is a 68 y.o.female seen today for follow up of the following medical problems.    1. CAD   - prior CABG in 2007 3 vessel (LIMA to LAD, SVG to OM, SVG to PDA)  - Echo 12/2012 LVEF 60-65%    - 01/2014 exercise MPI: did not reach THR, exercise limited by SOB and chest pain. Converted to Lexiscan . Apical anterior and anterolateral ischemia, intermediate risk. LVEF 62%  - 06/2015 cath received stent to SVG-OM suboptimal result. If continued symptoms consider PCI of RCA 06/2015 echo: LVEF 65-70%, no WMAs, grade I diastolic dysfunction, mild to moderate AS - she is on indefinite DAPT    -no chest pains, no SOB/DOE - compliant with meds     2. HTN - she is compliant with meds   3. Hyperlipidemia - compliant with crestor  and zetia    06/2019 TC 145 HDL 49 LDL 76 TG 118 Jan 2022 TC 865 HDL 54 TG 72 LDL 65  Jan 2023 TC 152 TG 79 HDL 57 LDL 80 07/2022 TC 146 TG 89 HDL 55 LDL 74 - remains not interested in pcsk9i's     4.Aortic stenosis 12/2016 echo mild to moderate AS Jan 2022 echo LVEF 60-65%, grade II dd, mild AS mean grad 11  AVA VTI 1.6 - Jan 2025 echo: LVEF 60-65%, no WMAs, indet diastolic fxn, mild AS AVA VTI 8.42, mean grad 11.5     5. Carotid stenosis - carotid US  01/2015 with bilateral 1-39% disease.  2018 [place] : mild bilateral disease.  -no neuro symptoms  07/2020 mild <50% bilateral ICA disease -Jan 2025 carotid US : RICA 40-59%, LICA 40-59%.      6. LE edema - denies any recent edema. She is on lasix  20mg  daily.  - well controlled    SH: enjoys spending time outside, planting flowers. Past Medical History:  Diagnosis Date   Acute posthemorrhagic anemia    Atherosclerotic heart disease of native coronary artery without angina pectoris    Bleeding ulcer 06/2015   CAD (coronary artery disease)    Multivessel disease s/p CABG, DES SVG to OM  July 03 2015 (occluded SVG to PDA)   Cervical cancer (HCC)    Chronic bronchitis    Chronic  diastolic (congestive) heart failure (HCC)    Chronic obstructive pulmonary disease, unspecified (HCC)    Coarctation of aorta    Constipation, unspecified    COPD (chronic obstructive pulmonary disease) (HCC)    Depression    Essential (primary) hypertension    Essential hypertension    Gastrointestinal hemorrhage, unspecified    GERD (gastroesophageal reflux disease)    Heart disease    Heart failure, unspecified (HCC)    Heart murmur    Hyperlipidemia    Hyperlipidemia, unspecified    Hypertensive chronic kidney disease with stage 1 through stage 4 chronic kidney disease, or unspecified chronic kidney disease    Hypokalemia    Hypokalemia    Hypomagnesemia    Malignant neoplasm of cervix uteri, unspecified (HCC)    Nicotine  dependence, unspecified, uncomplicated    NSTEMI (non-ST elevated myocardial infarction) [facility])    April 2017   NSTEMI (non-ST elevated myocardial infarction) (HCC)    Obesity (BMI 30-39.9)    Other nonrheumatic aortic valve disorders      No Known Allergies   Current Outpatient Medications  Medication Sig Dispense Refill   acetaminophen  (TYLENOL ) 325 MG tablet Take 650 mg by mouth every 6 (six) hours as needed (  pain).     amLODipine  (NORVASC ) 10 MG tablet Take 1 tablet (10 mg total) by mouth daily. 90 tablet 1   aspirin  EC 81 MG EC tablet Take 1 tablet (81 mg total) by mouth daily.     clopidogrel  (PLAVIX ) 75 MG tablet Take 1 tablet (75 mg total) by mouth daily with breakfast. 90 tablet 1   ezetimibe  (ZETIA ) 10 MG tablet Take 1 tablet (10 mg total) by mouth daily. 90 tablet 1   furosemide  (LASIX ) 20 MG tablet Take 1 tablet (20 mg total) by mouth daily. 90 tablet 1   hydrALAZINE  (APRESOLINE ) 50 MG tablet Take 1.5 tablets (75 mg total) by mouth 3 (three) times daily. 405 tablet 1   isosorbide  mononitrate (IMDUR ) 120 MG 24 hr tablet Take 1 tablet (120 mg total) by mouth daily. 90 tablet 1   losartan  (COZAAR ) 25 MG tablet Take 1 tablet (25 mg total) by  mouth daily. 90 tablet 1   metoprolol  tartrate (LOPRESSOR ) 50 MG tablet Take 1.5 tablets (75 mg total) by mouth 2 (two) times daily. 270 tablet 1   nitroGLYCERIN  (NITROSTAT ) 0.4 MG SL tablet Place 1 tablet (0.4 mg total) under the tongue every 5 (five) minutes as needed for chest pain. 25 tablet 3   rosuvastatin  (CRESTOR ) 40 MG tablet Take 1 tablet (40 mg total) by mouth at bedtime. 90 tablet 0   No current facility-administered medications for this visit.     Past Surgical History:  Procedure Laterality Date   BREAST BIOPSY Left    Benign   CARDIAC CATHETERIZATION N/A 06/30/2015   Procedure: Left Heart Cath and Cors/Grafts Angiography;  Surgeon: Alm LELON Clay, MD;  Location: [facility] INVASIVE CV LAB;  Service: Cardiovascular;  Laterality: N/A;   CARDIAC CATHETERIZATION N/A 07/03/2015   Procedure: Coronary Stent Intervention;  Surgeon: Peter M Swaziland, MD;  Location: [facility] INVASIVE CV LAB;  Service: Cardiovascular;  Laterality: N/A;   CATARACT EXTRACTION W/PHACO Left 03/16/2021   Procedure: CATARACT EXTRACTION PHACO AND INTRAOCULAR LENS PLACEMENT (IOC);  Surgeon: Harrie Agent, MD;  Location: [facility] ORS;  Service: Ophthalmology;  Laterality: Left;  CDE: 21.42   CATARACT EXTRACTION W/PHACO Right 03/30/2021   Procedure: CATARACT EXTRACTION PHACO AND INTRAOCULAR LENS PLACEMENT (IOC);  Surgeon: Harrie Agent, MD;  Location: [facility] ORS;  Service: Ophthalmology;  Laterality: Right;  CDE: 25.28   CERVICAL CONE BIOPSY     CHOLECYSTECTOMY     CORONARY ARTERY BYPASS GRAFT  02/2006   LIMA to LAD, SVG to OM, SVG to PDA   ESOPHAGOGASTRODUODENOSCOPY N/A 07/22/2014   Procedure: ESOPHAGOGASTRODUODENOSCOPY (EGD);  Surgeon: Lupita FORBES Commander, MD;  Location: [facility] ENDOSCOPY;  Service: Endoscopy;  Laterality: N/A;   ESOPHAGOGASTRODUODENOSCOPY N/A 07/12/2015   Procedure: ESOPHAGOGASTRODUODENOSCOPY (EGD);  Surgeon: Claudis RAYMOND Rivet, MD;  Location: [facility] ENDO SU[facility];  Service: Endoscopy;  Laterality: N/A;   ESOPHAGOGASTRODUODENOSCOPY N/A  10/18/2015   Procedure: ESOPHAGOGASTRODUODENOSCOPY (EGD);  Surgeon: Claudis RAYMOND Rivet, MD;  Location: [facility] ENDO SUITE;  Service: Endoscopy;  Laterality: N/A;  2:55     No Known Allergies    Family History  Problem Relation Age of Onset   Heart attack Father 19   Lung cancer Mother 51     Social History Ms. Demps reports that she has been smoking cigarettes. She started smoking about 54 years ago. She has a 54.3 pack-year smoking history. She has never been exposed to tobacco smoke. She has never used smokeless tobacco. Ms. Stoneberg reports no history of alcohol use.    Physical Examination  Today's Vitals   09/18/23 1252  BP: 132/74  Pulse: 60  SpO2: 93%  Weight: 162 lb 12.8 oz (73.8 kg)  Height: 5' 2 (1.575 m)   Body mass index is 29.78 kg/m.  Gen: resting comfortably, no acute distress HEENT: no scleral icterus, pupils equal round and reactive, no palptable cervical adenopathy,  CV: RRR, 3/6 systolic murmur rusb. + bilateral carotid bruits Resp: Clear to auscultation bilaterally GI: abdomen is soft, non-tender, non-distended, normal bowel sounds, no hepatosplenomegaly MSK: extremities are warm, no edema.  Skin: warm, no rash Neuro:  no focal deficits Psych: appropriate affect   Diagnostic Studies  Jan 2022 echo IMPRESSIONS     1. Left ventricular ejection fraction, by estimation, is 60 to 65%. The  left ventricle has normal function. The left ventricle has no regional  wall motion abnormalities. There is mild left ventricular hypertrophy.  Left ventricular diastolic parameters  are consistent with Grade II diastolic dysfunction (pseudonormalization).  Elevated left atrial pressure.   2. Right ventricular systolic function is normal. The right ventricular  size is normal. There is normal pulmonary artery systolic pressure.   3. The mitral valve is normal in structure. No evidence of mitral valve  regurgitation. No evidence of mitral stenosis.   4. The aortic  valve is tricuspid. Aortic valve regurgitation is not  visualized. Mild aortic valve stenosis.   Jan 2025 echo 1. Left ventricular ejection fraction, by estimation, is 60 to 65%. The  left ventricle has normal function. The left ventricle has no regional  wall motion abnormalities. There is mild left ventricular hypertrophy.  Left ventricular diastolic parameters  are indeterminate. Elevated left atrial pressure. The average left  ventricular global longitudinal strain is -18.3 %. The global longitudinal  strain is normal.   2. Right ventricular systolic function is normal. The right ventricular  size is normal.   3. The mitral valve is normal in structure. No evidence of mitral valve  regurgitation. No evidence of mitral stenosis.   4. The tricuspid valve is abnormal.   5. The aortic valve was not well visualized. There is mild calcification  of the aortic valve. There is mild thickening of the aortic valve. Aortic  valve regurgitation is not visualized. Mild aortic valve stenosis. Aortic  valve area, by VTI measures 1.57   cm. Aortic valve mean gradient measures 11.5 mmHg. Aortic valve Vmax  measures 2.13 m/s.   6. The inferior vena cava is normal in size with greater than 50%  respiratory variability, suggesting right atrial pressure of 3 mmHg.   Jan 2025 carotid US  Summary:  Right Carotid: Velocities in the right ICA are consistent with a 40-59%                 stenosis. Non-hemodynamically significant plaque <50% noted  in                the CCA. The ECA appears <50% stenosed.   Left Carotid: Velocities in the left ICA are consistent with a 40-59%  stenosis.               Non-hemodynamically significant plaque <50% noted in the  CCA. The                ECA appears <50% stenosed.   Vertebrals:  Right vertebral artery demonstrates antegrade flow. Left  vertebral              artery demonstrates bidirectional flow.  Subclavians: Bilateral subclavian artery flow was  disturbed.   Assessment and Plan   1. CAD   - . Has been committed to indefinite DAPT - no recent symptoms,continue current meds   2. HTN - BP's have naturally come down with ongoign weight loss, have been downtitrating meds over time.  - bp at goal, continue current meds   3. Hyperlipidemia - LDL above goal. She is not interested in pcsk9i at this time. Continue crestor  and zetia .  - repeat lipid panel.     4. Aortic stenosis -mild AS, repeat echo 3-5 years.    5. Carotid stenosis -moderate carotid stenosis, arrange repeat study at our next f/u in 6 months    Dorn PHEBE Ross, M.D.,

## 2023-09-18 NOTE — Addendum Note (Signed)
 Addended by: Rahmir Beever G on: 09/18/2023 01:21 PM   Modules accepted: Orders

## 2023-09-26 ENCOUNTER — Other Ambulatory Visit (HOSPITAL_COMMUNITY)
Admission: RE | Admit: 2023-09-26 | Discharge: 2023-09-26 | Disposition: A | Discharge status: Home / Self Care | Source: Ambulatory Visit | Attending: Cardiology | Admitting: Cardiology

## 2023-09-26 DIAGNOSIS — R002 Palpitations: Secondary | ICD-10-CM | POA: Insufficient documentation

## 2023-09-26 DIAGNOSIS — R7309 Other abnormal glucose: Secondary | ICD-10-CM | POA: Diagnosis not present

## 2023-09-26 DIAGNOSIS — I2510 Atherosclerotic heart disease of native coronary artery without angina pectoris: Secondary | ICD-10-CM | POA: Insufficient documentation

## 2023-09-26 DIAGNOSIS — E782 Mixed hyperlipidemia: Secondary | ICD-10-CM | POA: Diagnosis not present

## 2023-09-26 DIAGNOSIS — I10 Essential (primary) hypertension: Secondary | ICD-10-CM | POA: Diagnosis not present

## 2023-09-26 DIAGNOSIS — Z79899 Other long term (current) drug therapy: Secondary | ICD-10-CM | POA: Insufficient documentation

## 2023-09-26 LAB — COMPREHENSIVE METABOLIC PANEL WITH GFR
ALT: 15 U/L (ref 0–44)
AST: 24 U/L (ref 15–41)
Albumin: 4.1 g/dL (ref 3.5–5.0)
Alkaline Phosphatase: 57 U/L (ref 38–126)
Anion gap: 12 (ref 5–15)
BUN: 14 mg/dL (ref 8–23)
CO2: 24 mmol/L (ref 22–32)
Calcium: 9.4 mg/dL (ref 8.9–10.3)
Chloride: 101 mmol/L (ref 98–111)
Creatinine, Ser: 1.5 mg/dL — ABNORMAL HIGH (ref 0.44–1.00)
GFR, Estimated: 37 mL/min — ABNORMAL LOW (ref >60)
Glucose, Bld: 99 mg/dL (ref 70–99)
Potassium: 4.1 mmol/L (ref 3.5–5.1)
Sodium: 137 mmol/L (ref 135–145)
Total Bilirubin: 0.7 mg/dL (ref 0.0–1.2)
Total Protein: 7.1 g/dL (ref 6.5–8.1)

## 2023-09-26 LAB — CBC
HCT: 42.5 % (ref 36.0–46.0)
Hemoglobin: 13.9 g/dL (ref 12.0–15.0)
MCH: 31 pg (ref 26.0–34.0)
MCHC: 32.7 g/dL (ref 30.0–36.0)
MCV: 94.7 fL (ref 80.0–100.0)
Platelets: 148 K/uL — ABNORMAL LOW (ref 150–400)
RBC: 4.49 MIL/uL (ref 3.87–5.11)
RDW: 14.1 % (ref 11.5–15.5)
WBC: 5.9 K/uL (ref 4.0–10.5)
nRBC: 0 % (ref 0.0–0.2)

## 2023-09-26 LAB — HEMOGLOBIN A1C
Hgb A1c MFr Bld: 5.5 % (ref 4.8–5.6)
Mean Plasma Glucose: 111.15 mg/dL

## 2023-09-26 LAB — MAGNESIUM: Magnesium: 2.4 mg/dL (ref 1.7–2.4)

## 2023-09-26 LAB — TSH: TSH: 2.085 u[IU]/mL (ref 0.350–4.500)

## 2023-09-26 LAB — LIPID PANEL
Cholesterol: 130 mg/dL (ref 0–200)
HDL: 50 mg/dL (ref >40)
LDL Cholesterol: 62 mg/dL (ref 0–99)
Total CHOL/HDL Ratio: 2.6 ratio
Triglycerides: 88 mg/dL (ref <150)
VLDL: 18 mg/dL (ref 0–40)

## 2023-10-01 ENCOUNTER — Ambulatory Visit: Payer: Self-pay | Admitting: Cardiology

## 2023-10-02 ENCOUNTER — Encounter: Payer: Self-pay | Admitting: *Deleted

## 2024-01-12 DIAGNOSIS — Z23 Encounter for immunization: Secondary | ICD-10-CM | POA: Diagnosis not present

## 2024-01-15 ENCOUNTER — Other Ambulatory Visit: Payer: Self-pay | Admitting: Cardiology

## 2024-02-10 ENCOUNTER — Other Ambulatory Visit: Payer: Self-pay | Admitting: Cardiology

## 2024-02-18 ENCOUNTER — Other Ambulatory Visit: Payer: Self-pay | Admitting: Cardiology

## 2024-02-26 ENCOUNTER — Other Ambulatory Visit: Payer: Self-pay | Admitting: Cardiology

## 2024-03-11 ENCOUNTER — Other Ambulatory Visit: Payer: Self-pay | Admitting: Cardiology

## 2024-04-15 ENCOUNTER — Other Ambulatory Visit: Payer: Self-pay | Admitting: Cardiology
